# Patient Record
Sex: Male | Born: 1960 | Race: White | Hispanic: No | Marital: Single | Smoking: Current every day smoker
Health system: Southern US, Community
[De-identification: ages and names within clinical notes are randomized; demographics above are authoritative.]

## PROBLEM LIST (undated history)

## (undated) DIAGNOSIS — I509 Heart failure, unspecified: Secondary | ICD-10-CM

## (undated) DIAGNOSIS — K859 Acute pancreatitis without necrosis or infection, unspecified: Secondary | ICD-10-CM

## (undated) DIAGNOSIS — K7460 Unspecified cirrhosis of liver: Secondary | ICD-10-CM

## (undated) DIAGNOSIS — I10 Essential (primary) hypertension: Secondary | ICD-10-CM

## (undated) DIAGNOSIS — K219 Gastro-esophageal reflux disease without esophagitis: Secondary | ICD-10-CM

## (undated) DIAGNOSIS — M109 Gout, unspecified: Secondary | ICD-10-CM

## (undated) HISTORY — PX: CARDIAC CATHETERIZATION: SHX172

## (undated) HISTORY — PX: COLON SURGERY: SHX602

---

## 2015-05-08 ENCOUNTER — Ambulatory Visit: Payer: Self-pay

## 2015-05-15 ENCOUNTER — Ambulatory Visit: Payer: Self-pay

## 2017-02-22 ENCOUNTER — Emergency Department: Payer: Medicare Other

## 2017-02-22 ENCOUNTER — Encounter: Payer: Self-pay | Admitting: Emergency Medicine

## 2017-02-22 ENCOUNTER — Observation Stay
Admission: EM | Admit: 2017-02-22 | Discharge: 2017-02-24 | Disposition: A | Discharge status: Home / Self Care | Payer: Medicare Other | Attending: Internal Medicine | Admitting: Internal Medicine

## 2017-02-22 DIAGNOSIS — N179 Acute kidney failure, unspecified: Secondary | ICD-10-CM | POA: Diagnosis not present

## 2017-02-22 DIAGNOSIS — Z79899 Other long term (current) drug therapy: Secondary | ICD-10-CM | POA: Diagnosis not present

## 2017-02-22 DIAGNOSIS — E860 Dehydration: Secondary | ICD-10-CM | POA: Diagnosis not present

## 2017-02-22 DIAGNOSIS — M353 Polymyalgia rheumatica: Secondary | ICD-10-CM | POA: Insufficient documentation

## 2017-02-22 DIAGNOSIS — F17200 Nicotine dependence, unspecified, uncomplicated: Secondary | ICD-10-CM | POA: Diagnosis not present

## 2017-02-22 DIAGNOSIS — I5030 Unspecified diastolic (congestive) heart failure: Secondary | ICD-10-CM | POA: Diagnosis not present

## 2017-02-22 DIAGNOSIS — N182 Chronic kidney disease, stage 2 (mild): Secondary | ICD-10-CM | POA: Diagnosis not present

## 2017-02-22 DIAGNOSIS — D631 Anemia in chronic kidney disease: Secondary | ICD-10-CM | POA: Insufficient documentation

## 2017-02-22 DIAGNOSIS — R000 Tachycardia, unspecified: Secondary | ICD-10-CM | POA: Insufficient documentation

## 2017-02-22 DIAGNOSIS — K219 Gastro-esophageal reflux disease without esophagitis: Secondary | ICD-10-CM | POA: Insufficient documentation

## 2017-02-22 DIAGNOSIS — K7460 Unspecified cirrhosis of liver: Secondary | ICD-10-CM | POA: Diagnosis not present

## 2017-02-22 DIAGNOSIS — M10062 Idiopathic gout, left knee: Secondary | ICD-10-CM | POA: Diagnosis not present

## 2017-02-22 DIAGNOSIS — I130 Hypertensive heart and chronic kidney disease with heart failure and stage 1 through stage 4 chronic kidney disease, or unspecified chronic kidney disease: Secondary | ICD-10-CM | POA: Insufficient documentation

## 2017-02-22 DIAGNOSIS — M109 Gout, unspecified: Secondary | ICD-10-CM | POA: Diagnosis present

## 2017-02-22 DIAGNOSIS — Z8249 Family history of ischemic heart disease and other diseases of the circulatory system: Secondary | ICD-10-CM | POA: Insufficient documentation

## 2017-02-22 DIAGNOSIS — Z880 Allergy status to penicillin: Secondary | ICD-10-CM | POA: Diagnosis not present

## 2017-02-22 DIAGNOSIS — M1000 Idiopathic gout, unspecified site: Secondary | ICD-10-CM

## 2017-02-22 HISTORY — DX: Acute pancreatitis without necrosis or infection, unspecified: K85.90

## 2017-02-22 HISTORY — DX: Gout, unspecified: M10.9

## 2017-02-22 HISTORY — DX: Unspecified cirrhosis of liver: K74.60

## 2017-02-22 HISTORY — DX: Essential (primary) hypertension: I10

## 2017-02-22 HISTORY — DX: Heart failure, unspecified: I50.9

## 2017-02-22 HISTORY — DX: Gastro-esophageal reflux disease without esophagitis: K21.9

## 2017-02-22 LAB — CBC WITH DIFFERENTIAL/PLATELET
BASOS ABS: 0.1 10*3/uL (ref 0–0.1)
BASOS PCT: 1 %
EOS ABS: 0.4 10*3/uL (ref 0–0.7)
Eosinophils Relative: 3 %
HEMATOCRIT: 30.3 % — AB (ref 40.0–52.0)
Hemoglobin: 10.2 g/dL — ABNORMAL LOW (ref 13.0–18.0)
Lymphocytes Relative: 15 %
Lymphs Abs: 1.9 10*3/uL (ref 1.0–3.6)
MCH: 35.6 pg — ABNORMAL HIGH (ref 26.0–34.0)
MCHC: 33.5 g/dL (ref 32.0–36.0)
MCV: 106 fL — ABNORMAL HIGH (ref 80.0–100.0)
MONO ABS: 1 10*3/uL (ref 0.2–1.0)
Monocytes Relative: 8 %
NEUTROS ABS: 9.5 10*3/uL — AB (ref 1.4–6.5)
NEUTROS PCT: 73 %
Platelets: 881 10*3/uL — ABNORMAL HIGH (ref 150–440)
RBC: 2.86 MIL/uL — ABNORMAL LOW (ref 4.40–5.90)
RDW: 15.2 % — AB (ref 11.5–14.5)
WBC: 13 10*3/uL — ABNORMAL HIGH (ref 3.8–10.6)

## 2017-02-22 LAB — COMPREHENSIVE METABOLIC PANEL
ALK PHOS: 329 U/L — AB (ref 38–126)
ALT: 17 U/L (ref 17–63)
ANION GAP: 11 (ref 5–15)
AST: 30 U/L (ref 15–41)
Albumin: 3.3 g/dL — ABNORMAL LOW (ref 3.5–5.0)
BILIRUBIN TOTAL: 0.6 mg/dL (ref 0.3–1.2)
BUN: 45 mg/dL — ABNORMAL HIGH (ref 6–20)
CALCIUM: 10.8 mg/dL — AB (ref 8.9–10.3)
CO2: 20 mmol/L — ABNORMAL LOW (ref 22–32)
CREATININE: 2.08 mg/dL — AB (ref 0.61–1.24)
Chloride: 108 mmol/L (ref 101–111)
GFR calc non Af Amer: 34 mL/min — ABNORMAL LOW (ref >60)
GFR, EST AFRICAN AMERICAN: 40 mL/min — AB (ref >60)
GLUCOSE: 94 mg/dL (ref 65–99)
Potassium: 5.3 mmol/L — ABNORMAL HIGH (ref 3.5–5.1)
Sodium: 139 mmol/L (ref 135–145)
TOTAL PROTEIN: 8.8 g/dL — AB (ref 6.5–8.1)

## 2017-02-22 LAB — SYNOVIAL CELL COUNT + DIFF, W/ CRYSTALS
EOSINOPHILS-SYNOVIAL: 0 %
LYMPHOCYTES-SYNOVIAL FLD: 5 %
MONOCYTE-MACROPHAGE-SYNOVIAL FLUID: 4 %
Neutrophil, Synovial: 90 %
OTHER CELLS-SYN: 1
WBC, Synovial: 5890 /mm3 — ABNORMAL HIGH (ref 0–200)

## 2017-02-22 LAB — SEDIMENTATION RATE: Sed Rate: 127 mm/hr — ABNORMAL HIGH (ref 0–20)

## 2017-02-22 LAB — ETHANOL

## 2017-02-22 LAB — URIC ACID: Uric Acid, Serum: 11.9 mg/dL — ABNORMAL HIGH (ref 4.4–7.6)

## 2017-02-22 MED ORDER — METHYLPREDNISOLONE SODIUM SUCC 125 MG IJ SOLR
125.0000 mg | Freq: Once | INTRAMUSCULAR | Status: AC
  Administered 2017-02-22: 125 mg via INTRAVENOUS
  Filled 2017-02-22: qty 2

## 2017-02-22 MED ORDER — ALLOPURINOL 100 MG PO TABS
50.0000 mg | ORAL_TABLET | Freq: Every day | ORAL | Status: DC
  Administered 2017-02-22 – 2017-02-23 (×2): 50 mg via ORAL
  Filled 2017-02-22 (×2): qty 0.5

## 2017-02-22 MED ORDER — SODIUM CHLORIDE 0.9 % IV SOLN
Freq: Once | INTRAVENOUS | Status: AC
  Administered 2017-02-22: 11:00:00 via INTRAVENOUS

## 2017-02-22 MED ORDER — COLCHICINE 0.6 MG PO TABS
0.6000 mg | ORAL_TABLET | Freq: Every day | ORAL | Status: DC
  Administered 2017-02-23 – 2017-02-24 (×2): 0.6 mg via ORAL
  Filled 2017-02-22 (×2): qty 1

## 2017-02-22 MED ORDER — ACETAMINOPHEN 325 MG RE SUPP
650.0000 mg | Freq: Four times a day (QID) | RECTAL | Status: DC | PRN
  Filled 2017-02-22: qty 2

## 2017-02-22 MED ORDER — SODIUM CHLORIDE 0.9 % IV SOLN
INTRAVENOUS | Status: DC
  Administered 2017-02-22 – 2017-02-24 (×4): via INTRAVENOUS

## 2017-02-22 MED ORDER — ENOXAPARIN SODIUM 40 MG/0.4ML ~~LOC~~ SOLN
40.0000 mg | SUBCUTANEOUS | Status: DC
  Filled 2017-02-22 (×2): qty 0.4

## 2017-02-22 MED ORDER — HYDROCODONE-ACETAMINOPHEN 5-325 MG PO TABS
1.0000 | ORAL_TABLET | Freq: Once | ORAL | Status: AC
  Administered 2017-02-22: 1 via ORAL
  Filled 2017-02-22: qty 1

## 2017-02-22 MED ORDER — PREDNISONE 20 MG PO TABS
60.0000 mg | ORAL_TABLET | Freq: Every day | ORAL | Status: DC
  Administered 2017-02-23 – 2017-02-24 (×2): 60 mg via ORAL
  Filled 2017-02-22 (×2): qty 3

## 2017-02-22 MED ORDER — COLCHICINE 0.6 MG PO TABS
1.2000 mg | ORAL_TABLET | Freq: Once | ORAL | Status: AC
  Administered 2017-02-22: 1.2 mg via ORAL
  Filled 2017-02-22: qty 2

## 2017-02-22 MED ORDER — OXYCODONE HCL 5 MG PO TABS
5.0000 mg | ORAL_TABLET | ORAL | Status: DC | PRN
  Administered 2017-02-22 – 2017-02-24 (×7): 5 mg via ORAL
  Filled 2017-02-22 (×7): qty 1

## 2017-02-22 MED ORDER — NICOTINE 21 MG/24HR TD PT24: 21.0000 mg | MEDICATED_PATCH | Freq: Every day | TRANSDERMAL | Status: DC | PRN

## 2017-02-22 MED ORDER — HYDROMORPHONE HCL 1 MG/ML IJ SOLN
1.0000 mg | Freq: Once | INTRAMUSCULAR | Status: AC
  Administered 2017-02-22: 1 mg via INTRAVENOUS
  Filled 2017-02-22: qty 1

## 2017-02-22 MED ORDER — ACETAMINOPHEN 325 MG PO TABS: 650.0000 mg | ORAL_TABLET | Freq: Four times a day (QID) | ORAL | Status: DC | PRN

## 2017-02-22 NOTE — ED Notes (Signed)
Admitting MD at bedside.

## 2017-02-22 NOTE — ED Triage Notes (Signed)
Brought in via ems from home  Presents with pain to left hip which is radiating into lower leg and having pain to right knee into lower leg

## 2017-02-22 NOTE — Care Management Note (Signed)
Case Management Note  Patient Details  Name: Larry Andrade. MRN: [id number] Date of Birth: 1960-11-02  Subjective/Objective:    Admitted to [facility] under observation status with the diagnosis of gout. Lives with girlfriend, Arther Abbott [phone] Last seen Dr. Cedric Fishman in June 2018. Prescriptions are filled at [facility]. No home health. No skilled facility. No home oxygen. No medical equipment in the home. Takes care of all basic activities of daily living himself, drives. Fell 3 Saturdays ago. Good appetite. Girlfriend will transport                Action/Plan: No discharge needs identified at this time  Expected Discharge Date:                  Expected Discharge Plan:     In-House Referral:     Discharge planning Services     Post Acute Care Choice:    Choice offered to:     DME Arranged:    DME Agency:     HH Arranged:    HH Agency:     Status of Service:     If discussed at [employer], dates discussed:    Additional Comments:  Gwenette Greet, RNMSN CCM Care Management [phone] 02/22/2017, 3:09 PM

## 2017-02-22 NOTE — Progress Notes (Signed)
Patient is a high fall risk and refused the bed alarm. Patient educated and verbalized understanding.

## 2017-02-22 NOTE — Care Management Obs Status (Signed)
MEDICARE OBSERVATION STATUS NOTIFICATION   Patient Details  Name: Larry Andrade. MRN: [id number] Date of Birth: 05/11/1961   Medicare Observation Status Notification Given:  Yes    Gwenette Greet, RN 02/22/2017, 3:09 PM

## 2017-02-22 NOTE — H&P (Signed)
[facility] [place] [facility]   PATIENT NAME: Larry Andrade    MR#:  [id number]  DATE OF BIRTH:  1960-08-02  DATE OF ADMISSION:  02/22/2017  PRIMARY CARE PHYSICIAN: Pricilla Holm, MD   REQUESTING/REFERRING PHYSICIAN: Dr Presley Raddle  CHIEF COMPLAINT:   Chief Complaint  Patient presents with  . Leg Pain    HISTORY OF PRESENT ILLNESS:  Larry Andrade  is a 56 y.o. male with a known history of gout presents with 2 months worth of leg pain. His left knee hurts most at this time. 10 out of 10 intensity in his left knee. He's been taking colchicine 1-2 tablets a day. He states that his right leg hurts from his knee down. His left side hurts from his hip down. He's been having to walk with crutches and his knees give out. In the ER, he was found to be in acute kidney injury with a high uric acid. Hospitalist services asked for admission.  PAST MEDICAL HISTORY:   Past Medical History:  Diagnosis Date  . CHF (congestive heart failure) (HCC)   . Cirrhosis (HCC)   . GERD (gastroesophageal reflux disease)   . Gout   . Hypertension   . Pancreatitis     PAST SURGICAL HISTORY:   Past Surgical History:  Procedure Laterality Date  . CARDIAC CATHETERIZATION    . COLON SURGERY      SOCIAL HISTORY:   Social History  Substance Use Topics  . Smoking status: Current Every Day Smoker  . Smokeless tobacco: Never Used  . Alcohol use No     Comment: former    FAMILY HISTORY:   Family History  Problem Relation Age of Onset  . CVA Mother   . CAD Mother   . CAD Father     DRUG ALLERGIES:   Allergies  Allergen Reactions  . Penicillins Rash    REVIEW OF SYSTEMS:  CONSTITUTIONAL: No fever, positive for weakness. Positive for chills. EYES: No blurred or double vision.  EARS, NOSE, AND THROAT: No tinnitus or ear pain. No sore throat RESPIRATORY: positive for cough and shortness of breath. nowheezing or hemoptysis.  CARDIOVASCULAR: No chest  pain, orthopnea, edema.  GASTROINTESTINAL: No nausea, vomiting,  or abdominal pain. No blood in bowel movements. Positive diarrhea with colchicine GENITOURINARY: No dysuria, hematuria.  ENDOCRINE: No polyuria, nocturia,  HEMATOLOGY: No anemia, easy bruising or bleeding SKIN: No rash or lesion. MUSCULOSKELETAL:positive for joint pains  NEUROLOGIC: No tingling, numbness, weakness.  PSYCHIATRY: history of anxiety and depression  MEDICATIONS AT HOME:   Prior to Admission medications   Not on File   Medication reconciliation not done yet    VITAL SIGNS:  Blood pressure 114/78, pulse (!) 117, temperature (!) 97.5 F (36.4 C), temperature source Oral, resp. rate 20, height 6\' 2"  (1.88 m), weight 90.7 kg (200 lb), SpO2 100 %.  PHYSICAL EXAMINATION:  GENERAL:  56 y.o.-year-old patient lying in the bed with no acute distress.  EYES: Pupils equal, round, reactive to light and accommodation. No scleral icterus. Extraocular muscles intact.  HEENT: Head atraumatic, normocephalic. Oropharynx and nasopharynx clear.  NECK:  Supple, no jugular venous distention. No thyroid enlargement, no tenderness.  LUNGS: Normal breath sounds bilaterally, no wheezing, rales,rhonchi or crepitation. No use of accessory muscles of respiration.  CARDIOVASCULAR: S1, S2 normal. No murmurs, rubs, or gallops.  ABDOMEN: Soft, nontender, nondistended. Bowel sounds present. No organomegaly or mass.  EXTREMITIES: No pedal edema, cyanosis, or clubbing. Left knee swelling  and poor range of motion. Painful range of motion left hip. Joint deformities bilateral hands. Right elbow nodule. Joint deformities on toes. NEUROLOGIC: Cranial nerves II through XII are intact. Muscle strength 5/5 in all extremities. Sensation intact. Gait not checked.  PSYCHIATRIC: The patient is alert and oriented x 3.  SKIN: No rash, lesion, or ulcer.   LABORATORY PANEL:   CBC  Recent Labs Lab 02/22/17 0920  WBC 13.0*  HGB 10.2*  HCT 30.3*  PLT  881*   ------------------------------------------------------------------------------------------------------------------  Chemistries   Recent Labs Lab 02/22/17 0920  NA 139  K 5.3*  CL 108  CO2 20*  GLUCOSE 94  BUN 45*  CREATININE 2.08*  CALCIUM 10.8*  AST 30  ALT 17  ALKPHOS 329*  BILITOT 0.6   ------------------------------------------------------------------------------------------------------------------   RADIOLOGY:  Dg Ankle Complete Left  Result Date: 02/22/2017 CLINICAL DATA:  Left hip and foot pain for 2 weeks, cannot bear weight EXAM: LEFT ANKLE COMPLETE - 3+ VIEW COMPARISON:  None. FINDINGS: No acute fracture is seen. The ankle joint appears normal and alignment is normal. However there do appear to be erosions involving the tip of the distal fibula as well as posterior malleolus with ankle joint space effusion present. Is there history of arthritis such as gout? Some degenerative changes noted in the midfoot. IMPRESSION: 1. Possible erosions involving the distal left fibula and posterior malleolus with ankle joint effusion. Question arthritis such as gout. 2. No fracture Electronically Signed   By: Dwyane Dee M.D.   On: 02/22/2017 09:24   Dg Knee Complete 4 Views Left  Result Date: 02/22/2017 CLINICAL DATA:  Left hip and foot pain for 2 weeks, cannot bear weight EXAM: LEFT KNEE - COMPLETE 4+ VIEW COMPARISON:  None. FINDINGS: There is mild tricompartmental degenerative joint disease of the left knee with some loss of joint space. No fracture is seen. However there does appear to be a moderate size left knee joint effusion present. Arthritis would be a definite consideration. IMPRESSION: 1. No fracture. 2. Left knee joint effusion with moderate tricompartmental degenerative joint disease. Cannot exclude arthritis. Electronically Signed   By: Dwyane Dee M.D.   On: 02/22/2017 09:26   Dg Foot Complete Left  Result Date: 02/22/2017 CLINICAL DATA:  Left foot pain EXAM:  LEFT FOOT - COMPLETE 3+ VIEW COMPARISON:  None. FINDINGS: No acute fracture or dislocation. Mild osteoarthritis of the first MTP joint. Mild osteoarthritis of the first TMT joint. Mild osteoarthritis of the talonavicular joint. Mild osteoarthritis of the subtalar joint. Large ankle joint effusion. Large posterior subtalar joint effusion. IMPRESSION: 1.  No acute osseous injury of the left foot. 2. Osteoarthritis of the left foot as described above. 3. Large joint effusions of the ankle and posterior subtalar joint. Electronically Signed   By: Elige Ko   On: 02/22/2017 09:24     IMPRESSION AND PLAN:   1. Acute gout in multiple joints, worse within the left knee. The patient may have an underlying rheumatoid arthritis. In the ER the patient was given culture seen and Solu-Medrol. I will continue prednisone and colchicine on a daily basis. Case discussed with rheumatologist Dr. Lavenia Atlas to evaluate the patient. Add on and ANA and rheumatoid factor. Pain control with oxycodone. 2. Thrombocytosis could be an acute phase reactant.  Recheck tomorrow after hydration. May end up needing a hematology consultation. 3. Tobacco abuse. Smoking cessation counseling done 4 minutes by me. Nicotine patch when necessary 4. Essential hypertension blood pressure stable at this  point 5. Tachycardia likely related to pain 6. Hypercalcemia could be dehydration. Check a PTH 7. Acute kidney injury IV fluid hydration and recheck creatinine tomorrow 8. History of cirrhosis and alcohol past 9. No signs of congestive heart failure   All the records are reviewed and case discussed with ED provider. Management plans discussed with the patient, family and they are in agreement.  CODE STATUS: full code  TOTAL TIME TAKING CARE OF THIS PATIENT: 50 minutes.    Alford Highland M.D on 02/22/2017 at 11:25 AM  Between 7am to 6pm - Pager - [phone]  After 6pm call admission pager [phone]  [facility]  [phone]  CC: Primary care physician; Pricilla Holm, MD

## 2017-02-22 NOTE — Evaluation (Signed)
Physical Therapy Evaluation Patient Details Name: Larry Andrade. MRN: [id number] DOB: 1960/08/22 Today's Date: 02/22/2017   History of Present Illness  Pt is a 56 y.o. male presenting to hospital with severe L knee, ankle, and foot pain.  Pt admitted with acute gout in multiple joints (worse in L knee) and thrombocytosis.  PMH includes CHF, gout, htn, gout attack, cardiac cath.  Clinical Impression  Prior to hospital admission, pt was ambulating with axillary crutches d/t L LE pain but reports L knee/LE giving out d/t pain.  Pt lives with his girlfriend in 1 level home with 2 steps to enter (no railing).  Currently pt is min assist supine to sit; CGA with transfers; and CGA ambulating 25 feet with RW (limited distance per PT d/t 8/10 L LE pain).  Impaired L knee flexion and extension ROM noted as well as impaired L LE strength complicated d/t L LE hip, knee, and ankle pain.   Minimal WB'ing noted through L LE with mobility.  Pt appearing steady with use of RW and pt reporting feeling more comfortable and safe with RW compared to axillary crutches he had been using. Pt would benefit from skilled PT to address noted impairments and functional limitations (see below for any additional details).  Upon hospital discharge, recommend pt discharge to home with support of friends/family and OP PT.    Follow Up Recommendations Outpatient PT;Other (comment) (assist for stairs)    Equipment Recommendations  Rolling walker with 5" wheels    Recommendations for Other Services       Precautions / Restrictions Precautions Precautions: Fall Restrictions Weight Bearing Restrictions: No      Mobility  Bed Mobility Overal bed mobility: Needs Assistance Bed Mobility: Supine to Sit     Supine to sit: Min assist;HOB elevated     General bed mobility comments: assist for L LE d/t pain supine to sit  Transfers Overall transfer level: Needs assistance Equipment used: Rolling walker (2  wheeled) Transfers: Sit to/from Stand Sit to Stand: Min guard         General transfer comment: steady strong stand with RW; minimal WB'ing noted through L LE d/t pain  Ambulation/Gait Ambulation/Gait assistance: Min guard Ambulation Distance (Feet): 25 Feet Assistive device: Rolling walker (2 wheeled)   Gait velocity: decreased   General Gait Details: L knee and hip flexed throughout gait; decreased stance time and minimal WB'ing noted through L LE; initial vc's for walker use  Stairs            Wheelchair Mobility    Modified Rankin (Stroke Patients Only)       Balance Overall balance assessment: History of Falls;Needs assistance Sitting-balance support: No upper extremity supported;Feet supported Sitting balance-Leahy Scale: Good Sitting balance - Comments: sitting reaching within BOS   Standing balance support: Bilateral upper extremity supported (on RW) Standing balance-Leahy Scale: Fair Standing balance comment: static standing; pt steady with use of RW                             Pertinent Vitals/Pain Pain Assessment: 0-10 Pain Score: 8  Pain Location: L knee and L hip Pain Descriptors / Indicators: Burning;Tender;Grimacing;Guarding Pain Intervention(s): Limited activity within patient's tolerance;Monitored during session;Premedicated before session;Repositioned  O2 WFL during session on room air.  HR 104-114 bpm during session.    Home Living Family/patient expects to be discharged to:: Private residence Living Arrangements: Spouse/significant other Available Help at Discharge: Friend(s) Type  of Home: House Home Access: Stairs to enter Entrance Stairs-Rails: None Entrance Stairs-Number of Steps: 2 Home Layout: One level;Other (Comment) (1 step up to kitchen and 1 step down to living room in home) Home Equipment: Crutches (may have access to his mother's 4ww)      Prior Function Level of Independence: Independent with assistive  device(s)         Comments: Pt has been ambulating with axillary crutches but has had 2 falls (1 getting into shower and 1 fell back onto bed d/t L LE pain); pt also reporting L knee has been giving out when walking with crutches     Hand Dominance        Extremity/Trunk Assessment   Upper Extremity Assessment Upper Extremity Assessment: Overall WFL for tasks assessed    Lower Extremity Assessment Lower Extremity Assessment: LLE deficits/detail (R LE WFL) LLE Deficits / Details: L hip flexion at least 3-/5 (limited d/t hip pain); L knee flexion/extension at least 2/5 (limited d/t pain); L DF at least 3/5 AROM (deferred MMT d/t L ankle pain); L ankle ROM WFL; L knee flexion to 90 degrees (limited d/t pain); L knee extension grossly 20 degrees short of neutral (limited d/t pain) LLE: Unable to fully assess due to pain    Cervical / Trunk Assessment Cervical / Trunk Assessment: Normal  Communication   Communication: No difficulties  Cognition Arousal/Alertness: Awake/alert Behavior During Therapy: WFL for tasks assessed/performed Overall Cognitive Status: Within Functional Limits for tasks assessed                                        General Comments General comments (skin integrity, edema, etc.): Pt resting in bed upon PT entry.  Nursing cleared pt for participation in physical therapy and gave pt pain meds prior to PT session.  Pt agreeable to PT session.    Exercises     Assessment/Plan    PT Assessment Patient needs continued PT services  PT Problem List Decreased strength;Decreased range of motion;Decreased activity tolerance;Decreased balance;Decreased mobility;Decreased knowledge of use of DME;Pain       PT Treatment Interventions DME instruction;Gait training;Stair training;Functional mobility training;Therapeutic activities;Therapeutic exercise;Balance training;Patient/family education    PT Goals (Current goals can be found in the Care Plan  section)  Acute Rehab PT Goals Patient Stated Goal: to have less L LE pain PT Goal Formulation: With patient Time For Goal Achievement: 03/08/17 Potential to Achieve Goals: Fair    Frequency Min 2X/week   Barriers to discharge        Co-evaluation               AM-PAC PT "6 Clicks" Daily Activity  Outcome Measure Difficulty turning over in bed (including adjusting bedclothes, sheets and blankets)?: A Little Difficulty moving from lying on back to sitting on the side of the bed? : Unable Difficulty sitting down on and standing up from a chair with arms (e.g., wheelchair, bedside commode, etc,.)?: A Little Help needed moving to and from a bed to chair (including a wheelchair)?: A Little Help needed walking in hospital room?: A Little Help needed climbing 3-5 steps with a railing? : A Lot 6 Click Score: 15    End of Session Equipment Utilized During Treatment: Gait belt Activity Tolerance: No increased pain Patient left: in chair;with call bell/phone within reach;with nursing/sitter in room (Nursing reports no need for chair alarm (no  bed alarm on upon PT entry)) Nurse Communication: Mobility status;Precautions;Other (comment) (Pt's pain status) PT Visit Diagnosis: Other abnormalities of gait and mobility (R26.89);Muscle weakness (generalized) (M62.81);History of falling (Z91.81);Pain Pain - Right/Left: Left Pain - part of body: Hip;Knee;Ankle and joints of foot    Time: 3291-9166 PT Time Calculation (min) (ACUTE ONLY): 20 min   Charges:   PT Evaluation $PT Eval Low Complexity: 1 Low     PT G Codes:   PT G-Codes **NOT FOR INPATIENT CLASS** Functional Assessment Tool Used: AM-PAC 6 Clicks Basic Mobility Functional Limitation: Mobility: Walking and moving around Mobility: Walking and Moving Around Current Status (M6004): At least 40 percent but less than 60 percent impaired, limited or restricted Mobility: Walking and Moving Around Goal Status [phone] At least 1  percent but less than 20 percent impaired, limited or restricted    Hendricks Limes, PT 02/22/17, 4:30 PM [phone]

## 2017-02-22 NOTE — ED Notes (Signed)
Pt able to stand and pivot onto toilet. Pt pushed to bathroom via wheelchair.

## 2017-02-22 NOTE — Consult Note (Signed)
Reason for Consult: Joint pain and swelling  Referring Physician: Hospitalist  Hy Charlcie Cradle.   HPI: 56 year old white male. 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 By his report for the last 12 years he's had pain and swelling in his joints. He's developed nodule over his elbows. He said his left knee draining. Was told he had gout. He has even drained lesions in his fingers by using small syringe. Usually gets a cheesy material. He's never had kidney stones. He has had a solid left knee aspirated and injected. Gouty crystals were shown. He intermittently takes colchicine when he has a flare. He's not been on allopurinol or urine or car probenecid. There is no significant family history History of previously status is significant alcohol. Chart carries a diagnosis of cirrhosis but he is unaware. Recent flare and left ankle and left knee. 4 difficulty ambulating. Admitted. Had no fever. Did have thrombocytosis and anemia. And leukocytosis. Renal insufficiency No history of diabetes Alkaline phosphatase elevated at 329. Uric acid and 1.9. Sedimentation rate at 127.  PMH: Congestive heart failure. Cirrhosis. Gouty arthritis. Hypertension. Reflux. Pancreatitis.  SURGICAL HISTORY: Colostomy for benign colon mass  Family History: Negative for gout  Social History: Used to drink heavily but stopped for several years  Allergies:  Allergies  Allergen Reactions  . Penicillins Rash    Medications:  Scheduled: . allopurinol  50 mg Oral Daily  . [START ON 02/23/2017] colchicine  0.6 mg Oral Daily  . [START ON 02/23/2017] enoxaparin (LOVENOX) injection  40 mg Subcutaneous Q24H  . [START ON 02/23/2017] predniSONE  60 mg Oral Q breakfast        ROS:No weight change. No chest pain. No abdominal pain. No diarrhea.   PHYSICAL EXAM: Blood pressure 117/83, pulse (!) 107, temperature 97.7 F (36.5 C), temperature source Oral, resp. rate 18, height 6\' 2"  (1.88  m), weight 78.8 kg (173 lb 11.2 oz), SpO2 100 %. Pleasant male. Obvious joint pain with movement around the chair. Sclera clear. Clear pharynx. Clear chest. No definite visceromegaly. 1+ edema. Musculoskeletal: Mild disease range of motion of both shoulders. Bilateral elbow nodules with tophi. The right has been recently draining. Numerous tophaceous changes over both hands particularly of the right second third MCPs. Hips move reasonably. Left knee has a large effusion. Contracture. Right knee with effusion. Left ankle synovitis. Decreased range of motion right ankle. Contracted toes on the right. Tophaceous change left third PIP  Assessment: Tophaceous gout with recent flare Thrombocytosis. May be from his chronic gouty arthritis Renal insufficiency. Unknown duration or etiology Elevated alkaline phosphatase. Presumed liver. Cannot rule out underlying liver disease  Recommendations:  Procedure: Left knee prepped in sterile manner. Aspirated 110 cc of cloudy fluid. Injected with 2 cc  Xylocaine 2 cc Marcaine and 1 cc of generic Kenalog  X-ray reports reviewed Begin allopurinol at renal insufficiency dosing. 50 mg 1 by mouth daily. If creatinine is stable and improves he may be able to go on colchicine perhaps one pill every other day Would need long-term follow-up to ensure compliance and treating uric acid to goal which would be less than 5 with tophaceous changes May use IV steroids or oral steroid to calm his current flare  Kandyce Rud 02/22/2017, 4:58 PM

## 2017-02-22 NOTE — ED Notes (Signed)
Report to Mary, RN

## 2017-02-22 NOTE — ED Notes (Signed)
Pt given snack. 

## 2017-02-22 NOTE — ED Provider Notes (Signed)
[facility] Emergency Department Provider Note   ____________________________________________   I have reviewed the triage vital signs and the nursing notes.   HISTORY  Chief Complaint Leg Pain    HPI Larry Andrade. is a 56 y.o. male who presents to the emergency department for severe left knee, ankle and foot pain. Patient reports pain is interfering with walking to the point he feels unsteady as if he were going to fall. Patient denies any recent falls or injury to the left lower extremity. Patient reports increased pain with active movement of the left knee or ankle in addition to significant swelling along both joints. Patient has had to use an assisted device with all mobility activities. Patient denies fever, chills, headache, vision changes, chest pain, chest tightness, shortness of breath, abdominal pain, nausea and vomiting.  Past Medical History:  Diagnosis Date  . CHF (congestive heart failure) (HCC)   . Cirrhosis (HCC)   . GERD (gastroesophageal reflux disease)   . Gout   . Hypertension   . Pancreatitis     Patient Active Problem List   Diagnosis Date Noted  . Gout attack 02/22/2017    Past Surgical History:  Procedure Laterality Date  . CARDIAC CATHETERIZATION    . COLON SURGERY      Prior to Admission medications   Not on File    Allergies Penicillins  Family History  Problem Relation Age of Onset  . CVA Mother   . CAD Mother   . CAD Father     Social History Social History  Substance Use Topics  . Smoking status: Current Every Day Smoker  . Smokeless tobacco: Never Used  . Alcohol use No     Comment: former   Review of Systems Constitutional: Negative for fever/chills Eyes: No visual changes. ENT:  Negative for sore throat and for difficulty swallowing Cardiovascular: Denies chest pain. Respiratory: Denies cough. Denies shortness of breath. Gastrointestinal: No abdominal pain.  No nausea, vomiting,  diarrhea. Genitourinary: Negative for dysuria. Musculoskeletal: Positive for left knee, ankle and foot pain. Skin: Negative for rash. Neurological: Negative for headaches.  ____________________________________________   PHYSICAL EXAM:  VITAL SIGNS: ED Triage Vitals  Enc Vitals Group     BP 02/22/17 0816 114/78     Pulse Rate 02/22/17 0816 (!) 117     Resp 02/22/17 0816 20     Temp 02/22/17 0816 (!) 97.5 F (36.4 C)     Temp Source 02/22/17 0816 Oral     SpO2 02/22/17 0816 100 %     Weight 02/22/17 0817 200 lb (90.7 kg)     Height 02/22/17 0817 6\' 2"  (1.88 m)     Head Circumference --      Peak Flow --      Pain Score 02/22/17 0816 10     Pain Loc --      Pain Edu? --      Excl. in GC? --     Constitutional: Alert and oriented. Well appearing and in no acute distress.  Eyes: Conjunctivae are normal. PERRL. EOMI  Head: Normocephalic and atraumatic. ENT:      Ears: Canals clear. TMs intact bilaterally.      Nose: No congestion/rhinnorhea.      Mouth/Throat: Mucous membranes are moist.  Neck:Supple. No thyromegaly. No stridor.  Cardiovascular: Normal rate, regular rhythm. Normal S1 and S2.  Good peripheral circulation. Respiratory: Normal respiratory effort without tachypnea or retractions. Lungs CTAB. No wheezes/rales/rhonchi. Good air entry to the  bases with no decreased or absent breath sounds. Hematological/Lymphatic/Immunological: No cervical lymphadenopathy. Cardiovascular: Normal rate, regular rhythm. Normal distal pulses. Gastrointestinal: Bowel sounds 4 quadrants. Soft and nontender to palpation. No guarding or rigidity. No palpable masses. No distention. No CVA tenderness. Musculoskeletal: Severe left knee, ankle and foot pain and swelling. Erythema along the dorsal aspect of the foot, ankle. Erythema noted along the anterior knee. Good capillary refill, knee, ankle and foot is warm and dry Neurologic: Normal speech and language.  Skin:  Skin is warm, dry and  intact. No rash noted. Psychiatric: Mood and affect are normal. Speech and behavior are normal. Patient exhibits appropriate insight and judgement.  ____________________________________________   LABS (all labs ordered are listed, but only abnormal results are displayed)  Labs Reviewed  URIC ACID - Abnormal; Notable for the following:       Result Value   Uric Acid, Serum 11.9 (*)    All other components within normal limits  SEDIMENTATION RATE - Abnormal; Notable for the following:    Sed Rate 127 (*)    All other components within normal limits  COMPREHENSIVE METABOLIC PANEL - Abnormal; Notable for the following:    Potassium 5.3 (*)    CO2 20 (*)    BUN 45 (*)    Creatinine, Ser 2.08 (*)    Calcium 10.8 (*)    Total Protein 8.8 (*)    Albumin 3.3 (*)    Alkaline Phosphatase 329 (*)    GFR calc non Af Amer 34 (*)    GFR calc Af Amer 40 (*)    All other components within normal limits  CBC WITH DIFFERENTIAL/PLATELET - Abnormal; Notable for the following:    WBC 13.0 (*)    RBC 2.86 (*)    Hemoglobin 10.2 (*)    HCT 30.3 (*)    MCV 106.0 (*)    MCH 35.6 (*)    RDW 15.2 (*)    Platelets 881 (*)    Neutro Abs 9.5 (*)    All other components within normal limits  ETHANOL  RHEUMATOID FACTOR  ANA W/REFLEX IF POSITIVE  HIV ANTIBODY (ROUTINE TESTING)   ____________________________________________  EKG none ____________________________________________  RADIOLOGY DG left knee FINDINGS: There is mild tricompartmental degenerative joint disease of the left knee with some loss of joint space. No fracture is seen. However there does appear to be a moderate size left knee joint effusion present. Arthritis would be a definite consideration.  IMPRESSION: 1. No fracture. 2. Left knee joint effusion with moderate tricompartmental degenerative joint disease. Cannot exclude arthritis.  DG left ankle FINDINGS: No acute fracture is seen. The ankle joint appears normal  and alignment is normal. However there do appear to be erosions involving the tip of the distal fibula as well as posterior malleolus with ankle joint space effusion present. Is there history of arthritis such as gout? Some degenerative changes noted in the midfoot.  IMPRESSION: 1. Possible erosions involving the distal left fibula and posterior malleolus with ankle joint effusion. Question arthritis such as gout. 2. No fracture  DG left foot FINDINGS: No acute fracture or dislocation. Mild osteoarthritis of the first MTP joint. Mild osteoarthritis of the first TMT joint. Mild osteoarthritis of the talonavicular joint. Mild osteoarthritis of the subtalar joint. Large ankle joint effusion. Large posterior subtalar joint effusion.  IMPRESSION: 1. No acute osseous injury of the left foot. 2. Osteoarthritis of the left foot as described above. 3. Large joint effusions of the ankle and posterior  subtalar joint. ____________________________________________   PROCEDURES  Procedure(s) performed: no    Critical Care performed: no ____________________________________________   INITIAL IMPRESSION / ASSESSMENT AND PLAN / ED COURSE  Pertinent labs & imaging results that were available during my care of the patient were reviewed by me and considered in my medical decision making (see chart for details).  Patient presents to emergency department with left knee, ankle and foot pain and swelling likely associated with gout exacerbation. Treatment initiated with Solu-Medrol, colchicine and Vicodin. I spoke with Dr. Daryel November reviewing patient's lab results and his lack of pain management at this time. His recommendation was to contact the hospitalists regarding consultation for admission. Communicated with hospitalist, [employer]. Dr. Valla Leaver came to Flex Room 53 to consult with patient and care transferred at that time. ----------------------------------------- 11:09 AM on  02/22/2017 -----------------------------------------  ____________________________________________   FINAL CLINICAL IMPRESSION(S) / ED DIAGNOSES  Final diagnoses:  Gout flare  Acute idiopathic gout, unspecified site       NEW MEDICATIONS STARTED DURING THIS VISIT:  New Prescriptions   No medications on file     Note:  This document was prepared using Dragon voice recognition software and may include unintentional dictation errors.    Percell Boston 02/22/17 1127    Emily Filbert, MD 02/22/17 1224

## 2017-02-23 LAB — CBC
HEMATOCRIT: 25.8 % — AB (ref 40.0–52.0)
HEMOGLOBIN: 8.8 g/dL — AB (ref 13.0–18.0)
MCH: 35.9 pg — AB (ref 26.0–34.0)
MCHC: 34 g/dL (ref 32.0–36.0)
MCV: 105.8 fL — AB (ref 80.0–100.0)
PLATELETS: 710 10*3/uL — AB (ref 150–440)
RBC: 2.44 MIL/uL — ABNORMAL LOW (ref 4.40–5.90)
RDW: 14.8 % — AB (ref 11.5–14.5)
WBC: 11.6 10*3/uL — AB (ref 3.8–10.6)

## 2017-02-23 LAB — IRON AND TIBC
Iron: 53 ug/dL (ref 45–182)
Saturation Ratios: 25 % (ref 17.9–39.5)
TIBC: 210 ug/dL — AB (ref 250–450)
UIBC: 157 ug/dL

## 2017-02-23 LAB — BASIC METABOLIC PANEL
ANION GAP: 8 (ref 5–15)
BUN: 43 mg/dL — AB (ref 6–20)
CHLORIDE: 107 mmol/L (ref 101–111)
CO2: 20 mmol/L — ABNORMAL LOW (ref 22–32)
Calcium: 9.4 mg/dL (ref 8.9–10.3)
Creatinine, Ser: 1.45 mg/dL — ABNORMAL HIGH (ref 0.61–1.24)
GFR calc Af Amer: >60 mL/min (ref >60)
GFR, EST NON AFRICAN AMERICAN: 53 mL/min — AB (ref >60)
GLUCOSE: 158 mg/dL — AB (ref 65–99)
POTASSIUM: 5.1 mmol/L (ref 3.5–5.1)
Sodium: 135 mmol/L (ref 135–145)

## 2017-02-23 LAB — HIV ANTIBODY (ROUTINE TESTING W REFLEX): HIV SCREEN 4TH GENERATION: NONREACTIVE

## 2017-02-23 LAB — VITAMIN B12: Vitamin B-12: 235 pg/mL (ref 180–914)

## 2017-02-23 LAB — RETICULOCYTES
RBC.: 2.3 MIL/uL — AB (ref 4.40–5.90)
RETIC CT PCT: 1.4 % (ref 0.4–3.1)
Retic Count, Absolute: 32.2 10*3/uL (ref 19.0–183.0)

## 2017-02-23 LAB — FERRITIN: FERRITIN: 879 ng/mL — AB (ref 24–336)

## 2017-02-23 LAB — FOLATE: Folate: 4.3 ng/mL — ABNORMAL LOW (ref >5.9)

## 2017-02-23 LAB — PSA: PROSTATIC SPECIFIC ANTIGEN: 0.27 ng/mL (ref 0.00–4.00)

## 2017-02-23 LAB — ANA W/REFLEX IF POSITIVE: Anti Nuclear Antibody(ANA): NEGATIVE

## 2017-02-23 LAB — RHEUMATOID FACTOR: RHEUMATOID FACTOR: 12.7 [IU]/mL (ref 0.0–13.9)

## 2017-02-23 MED ORDER — AMLODIPINE BESYLATE 5 MG PO TABS
5.0000 mg | ORAL_TABLET | Freq: Every day | ORAL | Status: DC
  Administered 2017-02-23: 13:00:00 5 mg via ORAL
  Filled 2017-02-23: qty 1

## 2017-02-23 MED ORDER — ALUM & MAG HYDROXIDE-SIMETH 200-200-20 MG/5ML PO SUSP
30.0000 mL | ORAL | Status: DC | PRN
  Administered 2017-02-23 – 2017-02-24 (×4): 30 mL via ORAL
  Filled 2017-02-23 (×4): qty 30

## 2017-02-23 MED ORDER — PANTOPRAZOLE SODIUM 40 MG PO TBEC
40.0000 mg | DELAYED_RELEASE_TABLET | Freq: Every day | ORAL | Status: DC
  Administered 2017-02-23 – 2017-02-24 (×2): 40 mg via ORAL
  Filled 2017-02-23 (×2): qty 1

## 2017-02-23 MED ORDER — ALLOPURINOL 100 MG PO TABS
100.0000 mg | ORAL_TABLET | Freq: Every day | ORAL | Status: DC
  Administered 2017-02-23: 13:00:00 50 mg via ORAL
  Administered 2017-02-24: 08:00:00 100 mg via ORAL
  Filled 2017-02-23 (×2): qty 1

## 2017-02-23 NOTE — Progress Notes (Signed)
[employer] [facility] - [place] [facility]   PATIENT NAME: Larry KannerJohn Andrade    MR#:  [id number]  DATE OF BIRTH:  [birth year]  SUBJECTIVE:  CHIEF COMPLAINT:   Chief Complaint  Patient presents with  . Leg Pain   -Acute gouty arthritis, significant pain in multiple joints-worsened left knee and both ankles.  REVIEW OF SYSTEMS:  Review of Systems  Constitutional: Negative for chills, fever and malaise/fatigue.  HENT: Negative for congestion, ear discharge and nosebleeds.   Eyes: Negative for blurred vision and double vision.  Respiratory: Negative for cough, shortness of breath and wheezing.   Cardiovascular: Positive for leg swelling. Negative for chest pain and palpitations.  Gastrointestinal: Negative for abdominal pain, constipation, diarrhea, nausea and vomiting.  Genitourinary: Negative for dysuria.  Musculoskeletal: Positive for joint pain and myalgias.  Neurological: Negative for dizziness, sensory change, speech change, focal weakness, seizures and headaches.  Psychiatric/Behavioral: Negative for depression.    DRUG ALLERGIES:   Allergies  Allergen Reactions  . Penicillins Rash    Has patient had a PCN reaction causing immediate rash, facial/tongue/throat swelling, SOB or lightheadedness with hypotension: No Has patient had a PCN reaction causing severe rash involving mucus membranes or skin necrosis: No Has patient had a PCN reaction that required hospitalization: No Has patient had a PCN reaction occurring within the last 10 years: No If all of the above answers are "NO", then may proceed with Cephalosporin use.     VITALS:  Blood pressure (!) 147/90, pulse 88, temperature (!) 97.5 F (36.4 C), temperature source Oral, resp. rate 20, height 6\' 2"  (1.88 m), weight 78.8 kg (173 lb 11.2 oz), SpO2 99 %.  PHYSICAL EXAMINATION:  Physical Exam  GENERAL:  56 y.o.-year-old patient lying in the bed with no acute distress.  EYES: Pupils equal, round, reactive to  light and accommodation. No scleral icterus. Extraocular muscles intact.  HEENT: Head atraumatic, normocephalic. Oropharynx and nasopharynx clear.  NECK:  Supple, no jugular venous distention. No thyroid enlargement, no tenderness.  LUNGS: Normal breath sounds bilaterally, decreased bibasilar breath sounds, no wheezing, rales,rhonchi or crepitation. No use of accessory muscles of respiration.  CARDIOVASCULAR: S1, S2 normal. No murmurs, rubs, or gallops.  ABDOMEN: Soft, nontender, nondistended. Bowel sounds present. No organomegaly or mass.  EXTREMITIES: Bilateral ankle edema noted. Swelling of left knee with tenderness, no erythema. No pedal edema, cyanosis, or clubbing. Left elbow tophi noted NEUROLOGIC: Cranial nerves II through XII are intact. Muscle strength 5/5 in all extremities. Sensation intact. Gait not checked.  PSYCHIATRIC: The patient is alert and oriented x 3.  SKIN: No obvious rash, lesion, or ulcer.    LABORATORY PANEL:   CBC  Recent Labs Lab 02/23/17 0525  WBC 11.6*  HGB 8.8*  HCT 25.8*  PLT 710*   ------------------------------------------------------------------------------------------------------------------  Chemistries   Recent Labs Lab 02/22/17 0920 02/23/17 0525  NA 139 135  K 5.3* 5.1  CL 108 107  CO2 20* 20*  GLUCOSE 94 158*  BUN 45* 43*  CREATININE 2.08* 1.45*  CALCIUM 10.8* 9.4  AST 30  --   ALT 17  --   ALKPHOS 329*  --   BILITOT 0.6  --    ------------------------------------------------------------------------------------------------------------------  Cardiac Enzymes No results for input(s): TROPONINI in the last 168 hours. ------------------------------------------------------------------------------------------------------------------  RADIOLOGY:  Dg Ankle Complete Left  Result Date: 02/22/2017 CLINICAL DATA:  Left hip and foot pain for 2 weeks, cannot bear weight EXAM: LEFT ANKLE COMPLETE - 3+ VIEW COMPARISON:  None. FINDINGS:  No  acute fracture is seen. The ankle joint appears normal and alignment is normal. However there do appear to be erosions involving the tip of the distal fibula as well as posterior malleolus with ankle joint space effusion present. Is there history of arthritis such as gout? Some degenerative changes noted in the midfoot. IMPRESSION: 1. Possible erosions involving the distal left fibula and posterior malleolus with ankle joint effusion. Question arthritis such as gout. 2. No fracture Electronically Signed   By: Dwyane Dee M.D.   On: 02/22/2017 09:24   Dg Knee Complete 4 Views Left  Result Date: 02/22/2017 CLINICAL DATA:  Left hip and foot pain for 2 weeks, cannot bear weight EXAM: LEFT KNEE - COMPLETE 4+ VIEW COMPARISON:  None. FINDINGS: There is mild tricompartmental degenerative joint disease of the left knee with some loss of joint space. No fracture is seen. However there does appear to be a moderate size left knee joint effusion present. Arthritis would be a definite consideration. IMPRESSION: 1. No fracture. 2. Left knee joint effusion with moderate tricompartmental degenerative joint disease. Cannot exclude arthritis. Electronically Signed   By: Dwyane Dee M.D.   On: 02/22/2017 09:26   Dg Foot Complete Left  Result Date: 02/22/2017 CLINICAL DATA:  Left foot pain EXAM: LEFT FOOT - COMPLETE 3+ VIEW COMPARISON:  None. FINDINGS: No acute fracture or dislocation. Mild osteoarthritis of the first MTP joint. Mild osteoarthritis of the first TMT joint. Mild osteoarthritis of the talonavicular joint. Mild osteoarthritis of the subtalar joint. Large ankle joint effusion. Large posterior subtalar joint effusion. IMPRESSION: 1.  No acute osseous injury of the left foot. 2. Osteoarthritis of the left foot as described above. 3. Large joint effusions of the ankle and posterior subtalar joint. Electronically Signed   By: Elige Ko   On: 02/22/2017 09:24    EKG:  No orders found for this or any previous  visit.  ASSESSMENT AND PLAN:   56 year old male with diastolic CHF, cirrhosis of the liver, GERD, gout, hypertension presents to hospital secondary to polymyalgia and left knee swelling.  #1 acute gouty arthritis-has had gout for a long time, taking colchicine every day. -Uric acid elevated and had significant left knee swelling and tenderness. -Appreciate rheumatology consult. Status post arthrocentesis of left knee with any percent neutrophils, no Gram stain organisms noted at this time. So we'll hold off antibiotics -Continue oral prednisone, daily colchicine for now and increase allopurinol. -Follow up uric acid in a.m., encourage ambulation, pain medications for now  #2 hypertension-will hold off on lisinopril due to his potassium levels. Start Norvasc  #3 acute renal failure-known CK D stage II with creatinine around 1.2 at baseline. -Continue gentle hydration. Prerenal due to dehydration on admission.  #4 hypercalcemia-Calcium and alkaline phosphatase are elevated, check PSA and PTH levels  #5 anemia-Baseline hemoglobin around 9-10, elevated MCV. Check anemia panel -Hemoglobin at 8.8. No acute indication for transfusion. -Monitor thrombocytosis  #6 tobacco use disorder-on nicotine patch  #7 DVT prophylaxis-Lovenox   All the records are reviewed and case discussed with Care Management/Social Workerr. Management plans discussed with the patient, family and they are in agreement.  CODE STATUS: Full code  TOTAL TIME TAKING CARE OF THIS PATIENT: 36 minutes.   POSSIBLE D/C tomorrow, DEPENDING ON CLINICAL CONDITION.   Jaleea Alesi M.D on 02/23/2017 at 9:08 AM  Between 7am to 6pm - Pager - [phone]  After 6pm go to [email or web address] - Social research officer, government  [employer]  [phone]  CC: Primary care physician; Pricilla Holm, MD

## 2017-02-24 LAB — BASIC METABOLIC PANEL
Anion gap: 7 (ref 5–15)
BUN: 39 mg/dL — AB (ref 6–20)
CHLORIDE: 106 mmol/L (ref 101–111)
CO2: 22 mmol/L (ref 22–32)
CREATININE: 1.08 mg/dL (ref 0.61–1.24)
Calcium: 9.1 mg/dL (ref 8.9–10.3)
GFR calc Af Amer: >60 mL/min (ref >60)
GFR calc non Af Amer: >60 mL/min (ref >60)
Glucose, Bld: 151 mg/dL — ABNORMAL HIGH (ref 65–99)
POTASSIUM: 4.8 mmol/L (ref 3.5–5.1)
Sodium: 135 mmol/L (ref 135–145)

## 2017-02-24 LAB — URIC ACID: Uric Acid, Serum: 8.4 mg/dL — ABNORMAL HIGH (ref 4.4–7.6)

## 2017-02-24 LAB — CBC
HEMATOCRIT: 26.3 % — AB (ref 40.0–52.0)
HEMOGLOBIN: 9 g/dL — AB (ref 13.0–18.0)
MCH: 35.1 pg — AB (ref 26.0–34.0)
MCHC: 34.2 g/dL (ref 32.0–36.0)
MCV: 102.6 fL — ABNORMAL HIGH (ref 80.0–100.0)
Platelets: 724 10*3/uL — ABNORMAL HIGH (ref 150–440)
RBC: 2.57 MIL/uL — ABNORMAL LOW (ref 4.40–5.90)
RDW: 15 % — ABNORMAL HIGH (ref 11.5–14.5)
WBC: 11.2 10*3/uL — ABNORMAL HIGH (ref 3.8–10.6)

## 2017-02-24 LAB — PTH, INTACT AND CALCIUM
Calcium, Total (PTH): 9.4 mg/dL (ref 8.7–10.2)
PTH: 21 pg/mL (ref 15–65)

## 2017-02-24 LAB — PARATHYROID HORMONE, INTACT (NO CA): PTH: 25 pg/mL (ref 15–65)

## 2017-02-24 MED ORDER — HYDRALAZINE HCL 20 MG/ML IJ SOLN
10.0000 mg | INTRAMUSCULAR | Status: DC | PRN
  Administered 2017-02-24: 10 mg via INTRAVENOUS
  Filled 2017-02-24: qty 1

## 2017-02-24 MED ORDER — AMLODIPINE BESYLATE 10 MG PO TABS
10.0000 mg | ORAL_TABLET | Freq: Every day | ORAL | Status: DC
  Administered 2017-02-24: 10 mg via ORAL
  Filled 2017-02-24: qty 1

## 2017-02-24 MED ORDER — PREDNISONE 50 MG PO TABS: 50.0000 mg | ORAL_TABLET | Freq: Every day | ORAL | 0 refills | Status: DC

## 2017-02-24 MED ORDER — ALLOPURINOL 100 MG PO TABS: 100.0000 mg | ORAL_TABLET | Freq: Every day | ORAL | 2 refills | Status: DC

## 2017-02-24 MED ORDER — OXYCODONE HCL 5 MG PO TABS: 5.0000 mg | ORAL_TABLET | Freq: Four times a day (QID) | ORAL | 0 refills | Status: DC | PRN

## 2017-02-24 MED ORDER — AMLODIPINE BESYLATE 10 MG PO TABS: 10.0000 mg | ORAL_TABLET | Freq: Every day | ORAL | 2 refills | Status: DC

## 2017-02-24 NOTE — Discharge Summary (Signed)
[facility] - [place] at [facility]   PATIENT NAME: Larry Andrade    MR#:  [id number]  DATE OF BIRTH:  1960-10-10  DATE OF ADMISSION:  02/22/2017   ADMITTING PHYSICIAN: Alford Highland, MD  DATE OF DISCHARGE: 02/24/2017  PRIMARY CARE PHYSICIAN: Pricilla Holm, MD   ADMISSION DIAGNOSIS:   Gout flare [M10.9] Acute idiopathic gout, unspecified site [M10.00]  DISCHARGE DIAGNOSIS:   Active Problems:   Gout attack   SECONDARY DIAGNOSIS:   Past Medical History:  Diagnosis Date  . CHF (congestive heart failure) (HCC)   . Cirrhosis (HCC)   . GERD (gastroesophageal reflux disease)   . Gout   . Hypertension   . Pancreatitis     HOSPITAL COURSE:   56 year old male with diastolic CHF, cirrhosis of the liver, GERD, gout, hypertension presents to hospital secondary to polymyalgia and left knee swelling.  #1 Acute gouty arthritis-has had gout for a long time,  -Uric acid elevated and had significant left knee swelling and tenderness on admission -Appreciate rheumatology consult. Status post arthrocentesis of left knee with no Gram stain organisms noted at this time. Negative cultures. No antibiotics at this time -Continue oral prednisone, daily colchicine and allopurinol. -uric acid is improving. At 8.3 today. Encouraged to decrease alcohol consumption and also information given about  #2 hypertension-will hold off on lisinopril due to his potassium levels. Started Norvasc  #3 acute renal failure-known CK D stage II with creatinine around 1.2 at baseline. -Continue gentle hydration.  -improved renal function with IV fluids. Advised not to take anymore ibuprofen at home.  #4 hypercalcemia-Calcium and alkaline phosphatase are elevated -improved now with fluids. PSA within normal limits. PTH is pending  #5 anemia-Baseline hemoglobin around 9-10, elevated MCV. Anemia panel with borderline low B12 levels. Recommend supplementation orally with over the  counter medications -Hemoglobin at 8.8. No acute indication for transfusion. -Monitor thrombocytosis  #6 tobacco use disorder-counseled against smoking while in the hospital.  Patient is able to ambulate with minimal pain. He will need to follow up with rheumatology as outpatient. -Physical therapy recommended a walker  DISCHARGE CONDITIONS:   guarded CONSULTS OBTAINED:   Treatment Team:  Kandyce Rud., MD  DRUG ALLERGIES:   Allergies  Allergen Reactions  . Penicillins Rash    Has patient had a PCN reaction causing immediate rash, facial/tongue/throat swelling, SOB or lightheadedness with hypotension: No Has patient had a PCN reaction causing severe rash involving mucus membranes or skin necrosis: No Has patient had a PCN reaction that required hospitalization: No Has patient had a PCN reaction occurring within the last 10 years: No If all of the above answers are "NO", then may proceed with Cephalosporin use.    DISCHARGE MEDICATIONS:   Allergies as of 02/24/2017      Reactions   Penicillins Rash   Has patient had a PCN reaction causing immediate rash, facial/tongue/throat swelling, SOB or lightheadedness with hypotension: No Has patient had a PCN reaction causing severe rash involving mucus membranes or skin necrosis: No Has patient had a PCN reaction that required hospitalization: No Has patient had a PCN reaction occurring within the last 10 years: No If all of the above answers are "NO", then may proceed with Cephalosporin use.      Medication List    STOP taking these medications   lisinopril 40 MG tablet Commonly known as:  PRINIVIL,ZESTRIL     TAKE these medications   allopurinol 100 MG tablet Commonly known as:  ZYLOPRIM Take 1 tablet (100 mg total) by mouth daily.   amLODipine 10 MG tablet Commonly known as:  NORVASC Take 1 tablet (10 mg total) by mouth daily.   colchicine 0.6 MG tablet Take 0.6-1.2 mg by mouth daily.   oxyCODONE 5 MG  immediate release tablet Commonly known as:  Oxy IR/ROXICODONE Take 1 tablet (5 mg total) by mouth every 6 (six) hours as needed for moderate pain or severe pain.   predniSONE 50 MG tablet Commonly known as:  DELTASONE Take 1 tablet (50 mg total) by mouth daily with breakfast. X 10 days            Discharge Care Instructions        Start     Ordered   02/25/17 0000  allopurinol (ZYLOPRIM) 100 MG tablet  Daily     02/24/17 0835   02/25/17 0000  amLODipine (NORVASC) 10 MG tablet  Daily     02/24/17 0835   02/25/17 0000  predniSONE (DELTASONE) 50 MG tablet  Daily with breakfast     02/24/17 0835   02/24/17 0000  oxyCODONE (OXY IR/ROXICODONE) 5 MG immediate release tablet  Every 6 hours PRN     02/24/17 0835   02/24/17 0000  Diet - low sodium heart healthy     02/24/17 0835   02/24/17 0000  Activity as tolerated - No restrictions     02/24/17 0835       DISCHARGE INSTRUCTIONS:   1. Rheumatology follow-up in 1-2 weeks 2. PCP follow-up in 2 weeks  DIET:   Cardiac diet  ACTIVITY:   Activity as tolerated  OXYGEN:   Home Oxygen: No.  Oxygen Delivery: room air  DISCHARGE LOCATION:   Home  If you experience worsening of your admission symptoms, develop shortness of breath, life threatening emergency, suicidal or homicidal thoughts you must seek medical attention immediately by calling 911 or calling your MD immediately  if symptoms less severe.  You Must read complete instructions/literature along with all the possible adverse reactions/side effects for all the Medicines you take and that have been prescribed to you. Take any new Medicines after you have completely understood and accpet all the possible adverse reactions/side effects.   Please note  You were cared for by a hospitalist during your hospital stay. If you have any questions about your discharge medications or the care you received while you were in the hospital after you are discharged, you can call the  unit and asked to speak with the hospitalist on call if the hospitalist that took care of you is not available. Once you are discharged, your primary care physician will handle any further medical issues. Please note that NO REFILLS for any discharge medications will be authorized once you are discharged, as it is imperative that you return to your primary care physician (or establish a relationship with a primary care physician if you do not have one) for your aftercare needs so that they can reassess your need for medications and monitor your lab values.    On the day of Discharge:  VITAL SIGNS:   Blood pressure (!) 156/99, pulse 97, temperature (!) 97.5 F (36.4 C), temperature source Oral, resp. rate 20, height 6\' 2"  (1.88 m), weight 78.8 kg (173 lb 11.2 oz), SpO2 100 %.  PHYSICAL EXAMINATION:    GENERAL:  56 y.o.-year-old patient lying in the bed with no acute distress.  EYES: Pupils equal, round, reactive to light and accommodation. No scleral icterus. Extraocular muscles  intact.  HEENT: Head atraumatic, normocephalic. Oropharynx and nasopharynx clear.  NECK:  Supple, no jugular venous distention. No thyroid enlargement, no tenderness.  LUNGS: Normal breath sounds bilaterally, decreased bibasilar breath sounds, no wheezing, rales,rhonchi or crepitation. No use of accessory muscles of respiration.  CARDIOVASCULAR: S1, S2 normal. No murmurs, rubs, or gallops.  ABDOMEN: Soft, nontender, nondistended. Bowel sounds present. No organomegaly or mass.  EXTREMITIES: Bilateral ankle edema noted. Swelling of left knee with tenderness, no erythema. No pedal edema, cyanosis, or clubbing. Left elbow tophi noted NEUROLOGIC: Cranial nerves II through XII are intact. Muscle strength 5/5 in all extremities. Sensation intact. Gait not checked.  PSYCHIATRIC: The patient is alert and oriented x 3.  SKIN: No obvious rash, lesion, or ulcer.   DATA REVIEW:   CBC  Recent Labs Lab 02/24/17 0430  WBC  11.2*  HGB 9.0*  HCT 26.3*  PLT 724*    Chemistries   Recent Labs Lab 02/22/17 0920  02/24/17 0430  NA 139  < > 135  K 5.3*  < > 4.8  CL 108  < > 106  CO2 20*  < > 22  GLUCOSE 94  < > 151*  BUN 45*  < > 39*  CREATININE 2.08*  < > 1.08  CALCIUM 10.8*  < > 9.1  AST 30  --   --   ALT 17  --   --   ALKPHOS 329*  --   --   BILITOT 0.6  --   --   < > = values in this interval not displayed.   Microbiology Results  Results for orders placed or performed during the hospital encounter of 02/22/17  Body fluid culture     Status: None (Preliminary result)   Collection Time: 02/22/17  5:25 PM  Result Value Ref Range Status   Specimen Description SYNOVIAL  Final   Special Requests LEFT KNEE  Final   Gram Stain   Final    ABUNDANT WBC PRESENT, PREDOMINANTLY PMN NO ORGANISMS SEEN    Culture   Final    NO GROWTH < 24 HOURS Performed at [facility], [address], [place], [place] [address]    Report Status PENDING  Incomplete    RADIOLOGY:  No results found.   Management plans discussed with the patient, family and they are in agreement.  CODE STATUS:     Code Status Orders        Start     Ordered   02/22/17 1121  Full code  Continuous     02/22/17 1122    Code Status History    Date Active Date Inactive Code Status Order ID Comments User Context   This patient has a current code status but no historical code status.      TOTAL TIME TAKING CARE OF THIS PATIENT: 37 minutes.    Genese Quebedeaux M.D on 02/24/2017 at 8:35 AM  Between 7am to 6pm - Pager - [phone]  After 6pm go to [email or web address] - Scientist, research (life sciences) [facility]  Office  [phone]  CC: Primary care physician; Pricilla Holm, MD   Note: This dictation was prepared with Dragon dictation along with smaller phrase technology. Any transcriptional errors that result from this process are unintentional.

## 2017-02-24 NOTE — Progress Notes (Signed)
Pt being discharged home, discharge instructions and prescriptions reviewed with pt, states understanding, pt with no complaints, awaiting transportation for discharge

## 2017-02-24 NOTE — Progress Notes (Signed)
Patient refuses bed alarm at this time, patient educated.

## 2017-02-26 LAB — BODY FLUID CULTURE: Culture: NO GROWTH

## 2017-10-24 ENCOUNTER — Other Ambulatory Visit: Payer: Self-pay

## 2017-10-24 ENCOUNTER — Encounter: Payer: Self-pay | Admitting: Emergency Medicine

## 2017-10-24 ENCOUNTER — Emergency Department: Payer: Medicare Other

## 2017-10-24 ENCOUNTER — Inpatient Hospital Stay
Admission: EM | Admit: 2017-10-24 | Discharge: 2017-10-26 | DRG: 381 | Disposition: A | Discharge status: Home / Self Care | Payer: Medicare Other | Attending: Internal Medicine | Admitting: Internal Medicine

## 2017-10-24 DIAGNOSIS — K920 Hematemesis: Secondary | ICD-10-CM

## 2017-10-24 DIAGNOSIS — F17200 Nicotine dependence, unspecified, uncomplicated: Secondary | ICD-10-CM | POA: Diagnosis present

## 2017-10-24 DIAGNOSIS — I509 Heart failure, unspecified: Secondary | ICD-10-CM | POA: Diagnosis present

## 2017-10-24 DIAGNOSIS — R1084 Generalized abdominal pain: Secondary | ICD-10-CM

## 2017-10-24 DIAGNOSIS — D72829 Elevated white blood cell count, unspecified: Secondary | ICD-10-CM | POA: Diagnosis present

## 2017-10-24 DIAGNOSIS — D62 Acute posthemorrhagic anemia: Secondary | ICD-10-CM

## 2017-10-24 DIAGNOSIS — F10129 Alcohol abuse with intoxication, unspecified: Secondary | ICD-10-CM | POA: Diagnosis present

## 2017-10-24 DIAGNOSIS — K2210 Ulcer of esophagus without bleeding: Principal | ICD-10-CM | POA: Diagnosis present

## 2017-10-24 DIAGNOSIS — K449 Diaphragmatic hernia without obstruction or gangrene: Secondary | ICD-10-CM | POA: Diagnosis present

## 2017-10-24 DIAGNOSIS — K760 Fatty (change of) liver, not elsewhere classified: Secondary | ICD-10-CM | POA: Diagnosis present

## 2017-10-24 DIAGNOSIS — K7460 Unspecified cirrhosis of liver: Secondary | ICD-10-CM | POA: Diagnosis present

## 2017-10-24 DIAGNOSIS — K269 Duodenal ulcer, unspecified as acute or chronic, without hemorrhage or perforation: Secondary | ICD-10-CM | POA: Diagnosis present

## 2017-10-24 DIAGNOSIS — E872 Acidosis, unspecified: Secondary | ICD-10-CM | POA: Diagnosis present

## 2017-10-24 DIAGNOSIS — M109 Gout, unspecified: Secondary | ICD-10-CM | POA: Diagnosis present

## 2017-10-24 DIAGNOSIS — E876 Hypokalemia: Secondary | ICD-10-CM | POA: Diagnosis present

## 2017-10-24 DIAGNOSIS — Z79899 Other long term (current) drug therapy: Secondary | ICD-10-CM

## 2017-10-24 DIAGNOSIS — Y902 Blood alcohol level of 40-59 mg/100 ml: Secondary | ICD-10-CM | POA: Diagnosis present

## 2017-10-24 DIAGNOSIS — K529 Noninfective gastroenteritis and colitis, unspecified: Secondary | ICD-10-CM | POA: Diagnosis present

## 2017-10-24 DIAGNOSIS — Z880 Allergy status to penicillin: Secondary | ICD-10-CM

## 2017-10-24 DIAGNOSIS — K219 Gastro-esophageal reflux disease without esophagitis: Secondary | ICD-10-CM | POA: Diagnosis present

## 2017-10-24 DIAGNOSIS — Z9049 Acquired absence of other specified parts of digestive tract: Secondary | ICD-10-CM

## 2017-10-24 DIAGNOSIS — I110 Hypertensive heart disease with heart failure: Secondary | ICD-10-CM | POA: Diagnosis present

## 2017-10-24 DIAGNOSIS — Z8719 Personal history of other diseases of the digestive system: Secondary | ICD-10-CM

## 2017-10-24 LAB — URINALYSIS, COMPLETE (UACMP) WITH MICROSCOPIC
Bacteria, UA: NONE SEEN
Glucose, UA: NEGATIVE mg/dL
Ketones, ur: 5 mg/dL — AB
LEUKOCYTES UA: NEGATIVE
Nitrite: NEGATIVE
PH: 5 (ref 5.0–8.0)
Protein, ur: 30 mg/dL — AB
SPECIFIC GRAVITY, URINE: 1.027 (ref 1.005–1.030)
Squamous Epithelial / LPF: NONE SEEN (ref 0–5)

## 2017-10-24 LAB — CBC WITH DIFFERENTIAL/PLATELET
BASOS ABS: 0.2 10*3/uL — AB (ref 0–0.1)
Basophils Relative: 1 %
Eosinophils Absolute: 0.2 10*3/uL (ref 0–0.7)
Eosinophils Relative: 2 %
HEMATOCRIT: 31 % — AB (ref 40.0–52.0)
Hemoglobin: 11 g/dL — ABNORMAL LOW (ref 13.0–18.0)
LYMPHS PCT: 14 %
Lymphs Abs: 2.2 10*3/uL (ref 1.0–3.6)
MCH: 42 pg — ABNORMAL HIGH (ref 26.0–34.0)
MCHC: 35.5 g/dL (ref 32.0–36.0)
MCV: 118.1 fL — AB (ref 80.0–100.0)
Monocytes Absolute: 1 10*3/uL (ref 0.2–1.0)
Monocytes Relative: 7 %
NEUTROS ABS: 11.7 10*3/uL — AB (ref 1.4–6.5)
Neutrophils Relative %: 76 %
PLATELETS: 361 10*3/uL (ref 150–440)
RBC: 2.63 MIL/uL — AB (ref 4.40–5.90)
RDW: 16.1 % — ABNORMAL HIGH (ref 11.5–14.5)
WBC: 15.4 10*3/uL — AB (ref 3.8–10.6)

## 2017-10-24 LAB — COMPREHENSIVE METABOLIC PANEL
ALT: 22 U/L (ref 17–63)
ANION GAP: 23 — AB (ref 5–15)
AST: 73 U/L — ABNORMAL HIGH (ref 15–41)
Albumin: 3 g/dL — ABNORMAL LOW (ref 3.5–5.0)
Alkaline Phosphatase: 285 U/L — ABNORMAL HIGH (ref 38–126)
BUN: 12 mg/dL (ref 6–20)
CHLORIDE: 88 mmol/L — AB (ref 101–111)
CO2: 25 mmol/L (ref 22–32)
Calcium: 8.4 mg/dL — ABNORMAL LOW (ref 8.9–10.3)
Creatinine, Ser: 1.2 mg/dL (ref 0.61–1.24)
GFR calc non Af Amer: >60 mL/min (ref >60)
Glucose, Bld: 102 mg/dL — ABNORMAL HIGH (ref 65–99)
POTASSIUM: 2.9 mmol/L — AB (ref 3.5–5.1)
Sodium: 136 mmol/L (ref 135–145)
Total Bilirubin: 1.1 mg/dL (ref 0.3–1.2)
Total Protein: 7.2 g/dL (ref 6.5–8.1)

## 2017-10-24 LAB — URINE DRUG SCREEN, QUALITATIVE (ARMC ONLY)
Amphetamines, Ur Screen: NOT DETECTED
Barbiturates, Ur Screen: NOT DETECTED
Benzodiazepine, Ur Scrn: NOT DETECTED
COCAINE METABOLITE, UR ~~LOC~~: NOT DETECTED
Cannabinoid 50 Ng, Ur ~~LOC~~: NOT DETECTED
MDMA (ECSTASY) UR SCREEN: NOT DETECTED
METHADONE SCREEN, URINE: NOT DETECTED
OPIATE, UR SCREEN: NOT DETECTED
Phencyclidine (PCP) Ur S: NOT DETECTED
Tricyclic, Ur Screen: NOT DETECTED

## 2017-10-24 LAB — LACTIC ACID, PLASMA
LACTIC ACID, VENOUS: 7.1 mmol/L — AB (ref 0.5–1.9)
Lactic Acid, Venous: 9.8 mmol/L (ref 0.5–1.9)

## 2017-10-24 LAB — LIPASE, BLOOD: Lipase: 47 U/L (ref 11–51)

## 2017-10-24 LAB — ETHANOL: ALCOHOL ETHYL (B): 57 mg/dL — AB (ref <10)

## 2017-10-24 MED ORDER — HALOPERIDOL LACTATE 5 MG/ML IJ SOLN
2.5000 mg | Freq: Once | INTRAMUSCULAR | Status: AC
Start: 2017-10-24 — End: 2017-10-24
  Administered 2017-10-24: 2.5 mg via INTRAVENOUS
  Filled 2017-10-24: qty 1

## 2017-10-24 MED ORDER — SODIUM CHLORIDE 0.9 % IV BOLUS
1000.0000 mL | Freq: Once | INTRAVENOUS | Status: AC
Start: 2017-10-24 — End: 2017-10-25
  Administered 2017-10-25: 1000 mL via INTRAVENOUS

## 2017-10-24 MED ORDER — SODIUM CHLORIDE 0.9 % IV BOLUS
1000.0000 mL | Freq: Once | INTRAVENOUS | Status: AC
  Administered 2017-10-24: 1000 mL via INTRAVENOUS

## 2017-10-24 MED ORDER — CEFTRIAXONE SODIUM 1 G IJ SOLR
1.0000 g | Freq: Once | INTRAMUSCULAR | Status: AC
  Administered 2017-10-24: 1 g via INTRAVENOUS
  Filled 2017-10-24: qty 10

## 2017-10-24 MED ORDER — IOPAMIDOL (ISOVUE-370) INJECTION 76%
75.0000 mL | Freq: Once | INTRAVENOUS | Status: AC | PRN
  Administered 2017-10-24: 75 mL via INTRAVENOUS

## 2017-10-24 MED ORDER — FENTANYL CITRATE (PF) 100 MCG/2ML IJ SOLN
75.0000 ug | Freq: Once | INTRAMUSCULAR | Status: AC
  Administered 2017-10-24: 75 ug via INTRAVENOUS
  Filled 2017-10-24: qty 2

## 2017-10-24 NOTE — ED Notes (Signed)
Pt to room 3 via w/c by EDT Scott to be placed on card monitor for EKG and further eval

## 2017-10-24 NOTE — ED Notes (Signed)
Pt reports vomiting x 4 today.  No diarrhea.  Pt reports feeling lightheaded.  No chest pain or sob.    Pt reports drinking etoh every day.  md at bedside. Family with pt.  Sinus tach on monitor.

## 2017-10-24 NOTE — ED Triage Notes (Signed)
Pt to triage via w/c with no distress noted; reports N/V since Saturday with bloody emesis; denies pain

## 2017-10-24 NOTE — ED Provider Notes (Signed)
[facility] Emergency Department Provider Note  ____________________________________________   First MD Initiated Contact with Patient 10/24/17 2004     (approximate)  I have reviewed the triage vital signs and the nursing notes.   HISTORY  Chief Complaint Emesis   HPI Larry Andrade. is a 57 y.o. male who comes to the emergency department with roughly 2 days of epigastric moderate to severe cramping nonradiating pain along with nausea and vomiting.  No diarrhea.  The symptoms are intermittent.  Seems to be somewhat worsened when trying to eat and somewhat improved when not.  He drinks alcohol every day and most recently drank about 5 hours ago.  He does have a previous surgical history of colectomy secondary to a mass.  Past Medical History:  Diagnosis Date  . CHF (congestive heart failure) (HCC)   . Cirrhosis (HCC)   . GERD (gastroesophageal reflux disease)   . Gout   . Hypertension   . Pancreatitis     Patient Active Problem List   Diagnosis Date Noted  . Lactic acid acidosis 10/25/2017  . Generalized abdominal pain   . Hematemesis with nausea   . Acute posthemorrhagic anemia   . Gout attack 02/22/2017    Past Surgical History:  Procedure Laterality Date  . CARDIAC CATHETERIZATION    . COLON SURGERY      Prior to Admission medications   Medication Sig Start Date End Date Taking? Authorizing Provider  allopurinol (ZYLOPRIM) 100 MG tablet Take 1 tablet (100 mg total) by mouth daily. 02/25/17  Yes Enid Baas, MD  amLODipine (NORVASC) 10 MG tablet Take 1 tablet (10 mg total) by mouth daily. 02/25/17  Yes Enid Baas, MD  colchicine 0.6 MG tablet Take 0.6-1.2 mg by mouth daily.   Yes [provider]  omeprazole (PRILOSEC) 20 MG capsule Take 20 mg by mouth daily.   Yes [provider]  oxyCODONE (OXY IR/ROXICODONE) 5 MG immediate release tablet Take 5 mg by mouth every 6 (six) hours as needed for severe pain.    Yes [provider]  oxyCODONE (OXY IR/ROXICODONE) 5 MG immediate release tablet Take 1 tablet (5 mg total) by mouth every 6 (six) hours as needed for moderate pain or severe pain. Patient not taking: Reported on 10/24/2017 02/24/17   Enid Baas, MD  predniSONE (DELTASONE) 50 MG tablet Take 1 tablet (50 mg total) by mouth daily with breakfast. X 10 days Patient not taking: Reported on 10/24/2017 02/25/17   Enid Baas, MD    Allergies Penicillins  Family History  Problem Relation Age of Onset  . CVA Mother   . CAD Mother   . CAD Father     Social History Social History   Tobacco Use  . Smoking status: Current Every Day Smoker  . Smokeless tobacco: Never Used  Substance Use Topics  . Alcohol use: No    Comment: former  . Drug use: No    Review of Systems Constitutional: No fever/chills Eyes: No visual changes. ENT: No sore throat. Cardiovascular: Denies chest pain. Respiratory: Denies shortness of breath. Gastrointestinal: Positive for abdominal pain.  Positive for nausea, positive for vomiting.  No diarrhea.  No constipation. Genitourinary: Negative for dysuria. Musculoskeletal: Negative for back pain. Skin: Negative for rash. Neurological: Negative for headaches, focal weakness or numbness.   ____________________________________________   PHYSICAL EXAM:  VITAL SIGNS: ED Triage Vitals  Enc Vitals Group     BP 10/24/17 1959 118/84     Pulse Rate 10/24/17  1959 (!) 130     Resp 10/24/17 1959 20     Temp 10/24/17 1959 97.7 F (36.5 C)     Temp Source 10/24/17 1959 Oral     SpO2 10/24/17 1959 97 %     Weight 10/24/17 1958 200 lb (90.7 kg)     Height 10/24/17 1958  (1.854 m)     Head Circumference --      Peak Flow --      Pain Score 10/24/17 1958 0     Pain Loc --      Pain Edu? --      Excl. in GC? --     Constitutional: Alert and oriented x4 uncomfortable appearing nontoxic no diaphoresis speaks in full clear sentences Eyes:  PERRL EOMI. midrange and brisk Head: Atraumatic. Nose: No congestion/rhinnorhea. Mouth/Throat: No trismus no tongue fasciculations Neck: No stridor.   Cardiovascular: Tachycardic rate, regular rhythm. Grossly normal heart sounds.  Good peripheral circulation. Respiratory: Normal respiratory effort.  No retractions. Lungs CTAB and moving good air Gastrointestinal: Soft abdomen no frank peritonitis but diffusely tender to palpation with no focality Musculoskeletal: No lower extremity edema no hand tremors neurologic:  Normal speech and language. No gross focal neurologic deficits are appreciated. Skin:  Skin is warm, dry and intact. No rash noted. Psychiatric: Mood and affect are normal. Speech and behavior are normal.    ____________________________________________   DIFFERENTIAL includes but not limited to  Small bowel obstruction, volvulus, appendicitis, diverticulitis, pancreatitis, gastritis ____________________________________________   LABS (all labs ordered are listed, but only abnormal results are displayed)  Labs Reviewed  COMPREHENSIVE METABOLIC PANEL - Abnormal; Notable for the following components:      Result Value   Potassium 2.9 (*)    Chloride 88 (*)    Glucose, Bld 102 (*)    Calcium 8.4 (*)    Albumin 3.0 (*)    AST 73 (*)    Alkaline Phosphatase 285 (*)    Anion gap 23 (*)    All other components within normal limits  CBC WITH DIFFERENTIAL/PLATELET - Abnormal; Notable for the following components:   WBC 15.4 (*)    RBC 2.63 (*)    Hemoglobin 11.0 (*)    HCT 31.0 (*)    MCV 118.1 (*)    MCH 42.0 (*)    RDW 16.1 (*)    Neutro Abs 11.7 (*)    Basophils Absolute 0.2 (*)    All other components within normal limits  LACTIC ACID, PLASMA - Abnormal; Notable for the following components:   Lactic Acid, Venous 9.8 (*)    All other components within normal limits  LACTIC ACID, PLASMA - Abnormal; Notable for the following components:   Lactic Acid, Venous 7.1  (*)    All other components within normal limits  ETHANOL - Abnormal; Notable for the following components:   Alcohol, Ethyl (B) 57 (*)    All other components within normal limits  URINALYSIS, COMPLETE (UACMP) WITH MICROSCOPIC - Abnormal; Notable for the following components:   Color, Urine AMBER (*)    APPearance CLEAR (*)    Hgb urine dipstick SMALL (*)    Bilirubin Urine SMALL (*)    Ketones, ur 5 (*)    Protein, ur 30 (*)    All other components within normal limits  BASIC METABOLIC PANEL - Abnormal; Notable for the following components:   Potassium 3.0 (*)    Chloride 96 (*)    Calcium 7.0 (*)  All other components within normal limits  CBC - Abnormal; Notable for the following components:   WBC 11.0 (*)    RBC 2.17 (*)    Hemoglobin 8.9 (*)    HCT 25.5 (*)    MCV 117.3 (*)    MCH 41.0 (*)    RDW 16.0 (*)    All other components within normal limits  LACTIC ACID, PLASMA - Abnormal; Notable for the following components:   Lactic Acid, Venous 2.0 (*)    All other components within normal limits  MAGNESIUM - Abnormal; Notable for the following components:   Magnesium 0.9 (*)    All other components within normal limits  CULTURE, BLOOD (ROUTINE X 2)  CULTURE, BLOOD (ROUTINE X 2)  LIPASE, BLOOD  URINE DRUG SCREEN, QUALITATIVE ([facility] ONLY)  GLUCOSE, CAPILLARY  LACTIC ACID, PLASMA  GLUCOSE, CAPILLARY  BASIC METABOLIC PANEL  MAGNESIUM    Lab work reviewed by me with lactic acid of 9.8 which is concerning for under resuscitation versus infection __________________________________________  EKG    ____________________________________________  RADIOLOGY  CT scan abdomen pelvis reviewed by me with no acute disease ____________________________________________   PROCEDURES  Procedure(s) performed: no  .Critical Care Performed by: Merrily Brittle, MD Authorized by: Merrily Brittle, MD   Critical care provider statement:    Critical care time (minutes):  30    Critical care time was exclusive of:  Separately billable procedures and treating other patients   Critical care was necessary to treat or prevent imminent or life-threatening deterioration of the following conditions:  Sepsis   Critical care was time spent personally by me on the following activities:  Development of treatment plan with patient or surrogate, discussions with consultants, evaluation of patient's response to treatment, examination of patient, obtaining history from patient or surrogate, ordering and performing treatments and interventions, ordering and review of laboratory studies, ordering and review of radiographic studies, pulse oximetry, re-evaluation of patient's condition and review of old charts    Critical Care performed: Yes  Observation: no ____________________________________________   INITIAL IMPRESSION / ASSESSMENT AND PLAN / ED COURSE  Pertinent labs & imaging results that were available during my care of the patient were reviewed by me and considered in my medical decision making (see chart for details).  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  IV fluids, IV Haldol for pain and nausea, and CT scan are pending.  Patient is n.p.o.    ----------------------------------------- 9:06 PM on 10/24/2017 -----------------------------------------  The patient's lactic acid came back at 9.8.  I am sending him to the scanner now without his creatinine given my high suspicion for an abdominal catastrophe.  ____________________________________________  The patient CT is fortunately negative for acute pathology however his lactic acid is elevated raising concern for bacteremia versus occult infection etc.  Broad spectrum antibiotics as well as blood cultures are pending now but he will require inpatient admission for continued work-up and resuscitation.  The patient verbalizes understanding and agree with the plan.  I  discussed with the hospitalist who has graciously agreed to the patient to her service.  FINAL CLINICAL IMPRESSION(S) / ED DIAGNOSES  Final diagnoses:  Lactic acidosis  Generalized abdominal pain      NEW MEDICATIONS STARTED DURING THIS VISIT:  Current Discharge Medication List       Note:  This document was prepared using Dragon voice recognition software and may include unintentional dictation errors.     Merrily Brittle, MD 10/26/17 [phone]

## 2017-10-25 ENCOUNTER — Other Ambulatory Visit: Payer: Self-pay

## 2017-10-25 DIAGNOSIS — E872 Acidosis, unspecified: Secondary | ICD-10-CM | POA: Diagnosis present

## 2017-10-25 DIAGNOSIS — R1084 Generalized abdominal pain: Secondary | ICD-10-CM | POA: Diagnosis not present

## 2017-10-25 DIAGNOSIS — I110 Hypertensive heart disease with heart failure: Secondary | ICD-10-CM | POA: Diagnosis present

## 2017-10-25 DIAGNOSIS — K7460 Unspecified cirrhosis of liver: Secondary | ICD-10-CM | POA: Diagnosis present

## 2017-10-25 DIAGNOSIS — F10129 Alcohol abuse with intoxication, unspecified: Secondary | ICD-10-CM | POA: Diagnosis present

## 2017-10-25 DIAGNOSIS — K2210 Ulcer of esophagus without bleeding: Secondary | ICD-10-CM | POA: Diagnosis present

## 2017-10-25 DIAGNOSIS — Z880 Allergy status to penicillin: Secondary | ICD-10-CM | POA: Diagnosis not present

## 2017-10-25 DIAGNOSIS — K920 Hematemesis: Secondary | ICD-10-CM | POA: Diagnosis not present

## 2017-10-25 DIAGNOSIS — K449 Diaphragmatic hernia without obstruction or gangrene: Secondary | ICD-10-CM | POA: Diagnosis present

## 2017-10-25 DIAGNOSIS — D62 Acute posthemorrhagic anemia: Secondary | ICD-10-CM

## 2017-10-25 DIAGNOSIS — K269 Duodenal ulcer, unspecified as acute or chronic, without hemorrhage or perforation: Secondary | ICD-10-CM | POA: Diagnosis not present

## 2017-10-25 DIAGNOSIS — E876 Hypokalemia: Secondary | ICD-10-CM | POA: Diagnosis present

## 2017-10-25 DIAGNOSIS — K760 Fatty (change of) liver, not elsewhere classified: Secondary | ICD-10-CM | POA: Diagnosis present

## 2017-10-25 DIAGNOSIS — K529 Noninfective gastroenteritis and colitis, unspecified: Secondary | ICD-10-CM | POA: Diagnosis present

## 2017-10-25 DIAGNOSIS — Z9049 Acquired absence of other specified parts of digestive tract: Secondary | ICD-10-CM | POA: Diagnosis not present

## 2017-10-25 DIAGNOSIS — F17200 Nicotine dependence, unspecified, uncomplicated: Secondary | ICD-10-CM | POA: Diagnosis present

## 2017-10-25 DIAGNOSIS — Y902 Blood alcohol level of 40-59 mg/100 ml: Secondary | ICD-10-CM | POA: Diagnosis present

## 2017-10-25 DIAGNOSIS — Z79899 Other long term (current) drug therapy: Secondary | ICD-10-CM | POA: Diagnosis not present

## 2017-10-25 DIAGNOSIS — I509 Heart failure, unspecified: Secondary | ICD-10-CM | POA: Diagnosis present

## 2017-10-25 DIAGNOSIS — D72829 Elevated white blood cell count, unspecified: Secondary | ICD-10-CM | POA: Diagnosis present

## 2017-10-25 DIAGNOSIS — M109 Gout, unspecified: Secondary | ICD-10-CM | POA: Diagnosis present

## 2017-10-25 DIAGNOSIS — Z8719 Personal history of other diseases of the digestive system: Secondary | ICD-10-CM | POA: Diagnosis not present

## 2017-10-25 DIAGNOSIS — K219 Gastro-esophageal reflux disease without esophagitis: Secondary | ICD-10-CM | POA: Diagnosis present

## 2017-10-25 LAB — BASIC METABOLIC PANEL
Anion gap: 10 (ref 5–15)
BUN: 11 mg/dL (ref 6–20)
CALCIUM: 7 mg/dL — AB (ref 8.9–10.3)
CHLORIDE: 96 mmol/L — AB (ref 101–111)
CO2: 30 mmol/L (ref 22–32)
CREATININE: 0.88 mg/dL (ref 0.61–1.24)
GFR calc Af Amer: >60 mL/min (ref >60)
GFR calc non Af Amer: >60 mL/min (ref >60)
GLUCOSE: 99 mg/dL (ref 65–99)
Potassium: 3 mmol/L — ABNORMAL LOW (ref 3.5–5.1)
Sodium: 136 mmol/L (ref 135–145)

## 2017-10-25 LAB — CBC
HCT: 25.5 % — ABNORMAL LOW (ref 40.0–52.0)
Hemoglobin: 8.9 g/dL — ABNORMAL LOW (ref 13.0–18.0)
MCH: 41 pg — AB (ref 26.0–34.0)
MCHC: 34.9 g/dL (ref 32.0–36.0)
MCV: 117.3 fL — AB (ref 80.0–100.0)
PLATELETS: 261 10*3/uL (ref 150–440)
RBC: 2.17 MIL/uL — ABNORMAL LOW (ref 4.40–5.90)
RDW: 16 % — ABNORMAL HIGH (ref 11.5–14.5)
WBC: 11 10*3/uL — ABNORMAL HIGH (ref 3.8–10.6)

## 2017-10-25 LAB — LACTIC ACID, PLASMA
LACTIC ACID, VENOUS: 2 mmol/L — AB (ref 0.5–1.9)
Lactic Acid, Venous: 1.8 mmol/L (ref 0.5–1.9)

## 2017-10-25 LAB — GLUCOSE, CAPILLARY
GLUCOSE-CAPILLARY: 98 mg/dL (ref 65–99)
Glucose-Capillary: 90 mg/dL (ref 65–99)

## 2017-10-25 LAB — MAGNESIUM: Magnesium: 0.9 mg/dL — CL (ref 1.7–2.4)

## 2017-10-25 MED ORDER — ACETAMINOPHEN 650 MG RE SUPP: 650.0000 mg | Freq: Four times a day (QID) | RECTAL | Status: DC | PRN

## 2017-10-25 MED ORDER — ALLOPURINOL 100 MG PO TABS
100.0000 mg | ORAL_TABLET | Freq: Every day | ORAL | Status: DC
  Administered 2017-10-25 – 2017-10-26 (×2): 100 mg via ORAL
  Filled 2017-10-25 (×2): qty 1

## 2017-10-25 MED ORDER — THIAMINE HCL 100 MG/ML IJ SOLN
Freq: Once | INTRAVENOUS | Status: AC
  Administered 2017-10-25: 02:00:00 via INTRAVENOUS
  Filled 2017-10-25: qty 1000

## 2017-10-25 MED ORDER — SODIUM CHLORIDE 0.9 % IV SOLN
INTRAVENOUS | Status: DC
  Administered 2017-10-25 – 2017-10-26 (×3): via INTRAVENOUS

## 2017-10-25 MED ORDER — NICOTINE 21 MG/24HR TD PT24
21.0000 mg | MEDICATED_PATCH | Freq: Every day | TRANSDERMAL | Status: DC
  Administered 2017-10-25 – 2017-10-26 (×2): 21 mg via TRANSDERMAL
  Filled 2017-10-25 (×2): qty 1

## 2017-10-25 MED ORDER — DOCUSATE SODIUM 100 MG PO CAPS
100.0000 mg | ORAL_CAPSULE | Freq: Two times a day (BID) | ORAL | Status: DC
  Filled 2017-10-25 (×2): qty 1

## 2017-10-25 MED ORDER — POTASSIUM CHLORIDE CRYS ER 20 MEQ PO TBCR
20.0000 meq | EXTENDED_RELEASE_TABLET | Freq: Once | ORAL | Status: AC
  Administered 2017-10-25: 20 meq via ORAL
  Filled 2017-10-25: qty 1

## 2017-10-25 MED ORDER — BISACODYL 5 MG PO TBEC: 5.0000 mg | DELAYED_RELEASE_TABLET | Freq: Every day | ORAL | Status: DC | PRN

## 2017-10-25 MED ORDER — POTASSIUM CHLORIDE CRYS ER 20 MEQ PO TBCR
40.0000 meq | EXTENDED_RELEASE_TABLET | Freq: Once | ORAL | Status: AC
  Administered 2017-10-25: 40 meq via ORAL
  Filled 2017-10-25: qty 2

## 2017-10-25 MED ORDER — ONDANSETRON HCL 4 MG PO TABS: 4.0000 mg | ORAL_TABLET | Freq: Four times a day (QID) | ORAL | Status: DC | PRN

## 2017-10-25 MED ORDER — ONDANSETRON HCL 4 MG/2ML IJ SOLN: 4.0000 mg | Freq: Four times a day (QID) | INTRAMUSCULAR | Status: DC | PRN

## 2017-10-25 MED ORDER — ADULT MULTIVITAMIN W/MINERALS CH
1.0000 | ORAL_TABLET | Freq: Every day | ORAL | Status: DC
  Administered 2017-10-25 – 2017-10-26 (×2): 1 via ORAL
  Filled 2017-10-25 (×2): qty 1

## 2017-10-25 MED ORDER — ACETAMINOPHEN 325 MG PO TABS: 650.0000 mg | ORAL_TABLET | Freq: Four times a day (QID) | ORAL | Status: DC | PRN

## 2017-10-25 MED ORDER — LORAZEPAM 2 MG/ML IJ SOLN: 0.0000 mg | Freq: Two times a day (BID) | INTRAMUSCULAR | Status: DC

## 2017-10-25 MED ORDER — LORAZEPAM 1 MG PO TABS: 1.0000 mg | ORAL_TABLET | Freq: Four times a day (QID) | ORAL | Status: DC | PRN

## 2017-10-25 MED ORDER — VITAMIN B-1 100 MG PO TABS
100.0000 mg | ORAL_TABLET | Freq: Every day | ORAL | Status: DC
  Administered 2017-10-25 – 2017-10-26 (×2): 100 mg via ORAL
  Filled 2017-10-25 (×2): qty 1

## 2017-10-25 MED ORDER — MAGNESIUM SULFATE 4 GM/100ML IV SOLN
4.0000 g | Freq: Once | INTRAVENOUS | Status: AC
  Administered 2017-10-25: 4 g via INTRAVENOUS
  Filled 2017-10-25: qty 100

## 2017-10-25 MED ORDER — FOLIC ACID 1 MG PO TABS
1.0000 mg | ORAL_TABLET | Freq: Every day | ORAL | Status: DC
  Administered 2017-10-25 – 2017-10-26 (×2): 1 mg via ORAL
  Filled 2017-10-25 (×2): qty 1

## 2017-10-25 MED ORDER — HEPARIN SODIUM (PORCINE) 5000 UNIT/ML IJ SOLN
5000.0000 [IU] | Freq: Three times a day (TID) | INTRAMUSCULAR | Status: DC
  Filled 2017-10-25 (×3): qty 1

## 2017-10-25 MED ORDER — LORAZEPAM 2 MG/ML IJ SOLN: 1.0000 mg | Freq: Four times a day (QID) | INTRAMUSCULAR | Status: DC | PRN

## 2017-10-25 MED ORDER — PANTOPRAZOLE SODIUM 40 MG PO TBEC
40.0000 mg | DELAYED_RELEASE_TABLET | Freq: Every day | ORAL | Status: DC
Start: 2017-10-25 — End: 2017-10-26
  Administered 2017-10-25 – 2017-10-26 (×2): 40 mg via ORAL
  Filled 2017-10-25 (×2): qty 1

## 2017-10-25 MED ORDER — LORAZEPAM 2 MG/ML IJ SOLN: 0.0000 mg | Freq: Four times a day (QID) | INTRAMUSCULAR | Status: DC

## 2017-10-25 MED ORDER — HYDROCODONE-ACETAMINOPHEN 5-325 MG PO TABS
1.0000 | ORAL_TABLET | ORAL | Status: DC | PRN
  Administered 2017-10-25 (×2): 2 via ORAL
  Administered 2017-10-25: 1 via ORAL
  Administered 2017-10-26 (×2): 2 via ORAL
  Filled 2017-10-25 (×3): qty 2
  Filled 2017-10-25: qty 1
  Filled 2017-10-25: qty 2

## 2017-10-25 MED ORDER — ALUM & MAG HYDROXIDE-SIMETH 200-200-20 MG/5ML PO SUSP
30.0000 mL | Freq: Four times a day (QID) | ORAL | Status: DC | PRN
  Administered 2017-10-25: 30 mL via ORAL
  Filled 2017-10-25: qty 30

## 2017-10-25 MED ORDER — AMLODIPINE BESYLATE 10 MG PO TABS
10.0000 mg | ORAL_TABLET | Freq: Every day | ORAL | Status: DC
Start: 2017-10-25 — End: 2017-10-26
  Administered 2017-10-25 – 2017-10-26 (×2): 10 mg via ORAL
  Filled 2017-10-25 (×2): qty 1

## 2017-10-25 NOTE — ED Notes (Signed)
Report called to melissa rn floor nurse 

## 2017-10-25 NOTE — Consult Note (Signed)
Midge Minium, MD [facility]  [address], [address] [place], [place] [address] Phone: [phone] Fax : [phone]  Consultation  Referring Provider:     Dr. Imogene Burn Primary Care Physician:  Pricilla Holm, MD Primary Gastroenterologist:  Gentry Fitz         Reason for Consultation:     Hematemesis  Date of Admission:  10/24/2017 Date of Consultation:  10/25/2017         HPI:   Larry Andrade. is a 57 y.o. male who reports that he was having epigastric pain with nausea and vomiting.  The patient has a history of a colon mass that he reports to be inflammatory and had it removed.  Since then the patient has had chronic diarrhea.  He reports that he vomited for a few days and did not come to the emergency room until he started vomiting blood yesterday. The patient has a history of alcohol abuse and states that he drinks quite frequently and has since he was 57 years old. The patient reports that since admission he has been doing well and has not had any further nausea vomiting or abdominal pain. The patient was found to have an elevated lactic acid at 9.8 that went down to 1.8 this morning.  The patient denies any black stools or bloody stools and denies his diarrhea that is chronic, is dark in color.  The patient's hemoglobin did drop from His admission of 11 down to 8.9.  The patient's hemoglobin back in August 2018 was 9.  The patient does have a history of tobacco use, GERD, hypertension, pancreatitis and CHF.  The patient was also noted to have an elevated white cell count at 15.4.The white cell count this morning was down to 11.  Past Medical History:  Diagnosis Date  . CHF (congestive heart failure) (HCC)   . Cirrhosis (HCC)   . GERD (gastroesophageal reflux disease)   . Gout   . Hypertension   . Pancreatitis     Past Surgical History:  Procedure Laterality Date  . CARDIAC CATHETERIZATION    . COLON SURGERY      Prior to Admission medications   Medication Sig Start Date End Date  Taking? Authorizing Provider  allopurinol (ZYLOPRIM) 100 MG tablet Take 1 tablet (100 mg total) by mouth daily. 02/25/17  Yes Enid Baas, MD  amLODipine (NORVASC) 10 MG tablet Take 1 tablet (10 mg total) by mouth daily. 02/25/17  Yes Enid Baas, MD  colchicine 0.6 MG tablet Take 0.6-1.2 mg by mouth daily.   Yes [provider]  omeprazole (PRILOSEC) 20 MG capsule Take 20 mg by mouth daily.   Yes [provider]  oxyCODONE (OXY IR/ROXICODONE) 5 MG immediate release tablet Take 5 mg by mouth every 6 (six) hours as needed for severe pain.   Yes [provider]  oxyCODONE (OXY IR/ROXICODONE) 5 MG immediate release tablet Take 1 tablet (5 mg total) by mouth every 6 (six) hours as needed for moderate pain or severe pain. Patient not taking: Reported on 10/24/2017 02/24/17   Enid Baas, MD  predniSONE (DELTASONE) 50 MG tablet Take 1 tablet (50 mg total) by mouth daily with breakfast. X 10 days Patient not taking: Reported on 10/24/2017 02/25/17   Enid Baas, MD    Family History  Problem Relation Age of Onset  . CVA Mother   . CAD Mother   . CAD Father      Social History   Tobacco Use  . Smoking  status: Current Every Day Smoker  . Smokeless tobacco: Never Used  Substance Use Topics  . Alcohol use: No    Comment: former  . Drug use: No    Allergies as of 10/24/2017 - Review Complete 10/24/2017  Allergen Reaction Noted  . Penicillins Rash 02/22/2017    Review of Systems:    All systems reviewed and negative except where noted in HPI.   Physical Exam:  Vital signs in last 24 hours: Temp:  [97.7 F (36.5 C)-99.8 F (37.7 C)] 98.6 F (37 C) (04/30 1205) Pulse Rate:  [102-130] 102 (04/30 1205) Resp:  [18-30] 20 (04/30 0426) BP: (118-139)/(71-96) 118/92 (04/30 1205) SpO2:  [91 %-100 %] 99 % (04/30 1205) Weight:  [189 lb 6 oz (85.9 kg)-200 lb (90.7 kg)] 189 lb 6 oz (85.9 kg) (04/30 0120) Last BM Date: 10/25/17 General:    Pleasant, cooperative in NAD Head:  Normocephalic and atraumatic. Eyes:   No icterus.   Conjunctiva pink. PERRLA. Ears:  Normal auditory acuity. Neck:  Supple; no masses or thyroidomegaly Lungs: Respirations even and unlabored. Lungs clear to auscultation bilaterally.   No wheezes, crackles, or rhonchi.  Heart:  Regular rate and rhythm;  Without murmur, clicks, rubs or gallops Abdomen:  Soft, nondistended, nontender. Normal bowel sounds. No appreciable masses or hepatomegaly.  No rebound or guarding.  Rectal:  Not performed. Msk:  Symmetrical without gross deformities.    Extremities:  Without edema, cyanosis or clubbing. Neurologic:  Alert and oriented x3;  grossly normal neurologically. Skin:  Intact without significant lesions or rashes. Cervical Nodes:  No significant cervical adenopathy. Psych:  Alert and cooperative. Normal affect.  LAB RESULTS: Recent Labs    10/24/17 2018 10/25/17 0500  WBC 15.4* 11.0*  HGB 11.0* 8.9*  HCT 31.0* 25.5*  PLT 361 261   BMET Recent Labs    10/24/17 2018 10/25/17 0500  NA 136 136  K 2.9* 3.0*  CL 88* 96*  CO2 25 30  GLUCOSE 102* 99  BUN 12 11  CREATININE 1.20 0.88  CALCIUM 8.4* 7.0*   LFT Recent Labs    10/24/17 2018  PROT 7.2  ALBUMIN 3.0*  AST 73*  ALT 22  ALKPHOS 285*  BILITOT 1.1   PT/INR No results for input(s): LABPROT, INR in the last 72 hours.  STUDIES: Ct Abdomen Pelvis W Contrast  Result Date: 10/24/2017 CLINICAL DATA:  Vomiting today.  Lightheaded.  Alcohol use. EXAM: CT ABDOMEN AND PELVIS WITH CONTRAST TECHNIQUE: Multidetector CT imaging of the abdomen and pelvis was performed using the standard protocol following bolus administration of intravenous contrast. CONTRAST:  75mL ISOVUE-370 IOPAMIDOL (ISOVUE-370) INJECTION 76% COMPARISON:  None. FINDINGS: Lower chest: The lung bases are clear. Coronary artery calcifications. Small esophageal hiatal hernia. Old left rib fractures. Hepatobiliary: Prominent diffuse  fatty infiltration of the liver with hepatomegaly. No focal lesions. Gallbladder and bile ducts are unremarkable. Pancreas: Unremarkable. No pancreatic ductal dilatation or surrounding inflammatory changes. Spleen: Normal in size without focal abnormality. Adrenals/Urinary Tract: Adrenal glands are unremarkable. Kidneys are normal, without renal calculi, focal lesion, or hydronephrosis. Bladder is decompressed. Stomach/Bowel: Previous colectomy with surgical anastomoses demonstrated in the right lower quadrant and sigmoid region. This likely represents a subtotal colectomy. Stomach and small bowel are mostly decompressed. There is a small periumbilical hernia containing small bowel but without proximal obstruction. No inflammatory infiltration or bowel wall thickening is appreciated. Vascular/Lymphatic: Aortic atherosclerosis. No enlarged abdominal or pelvic lymph nodes. Reproductive: Prostate is unremarkable. Other: No free air or  free fluid in the abdomen. Musculoskeletal: Degenerative changes in the spine and hips. No destructive bone lesions. IMPRESSION: 1. Previous subtotal colectomy. 2. Small periumbilical hernia containing small bowel but without proximal obstruction. 3. Prominent diffuse fatty infiltration of the liver with hepatomegaly. 4. Aortic atherosclerosis.  Coronary artery calcifications. 5. Small esophageal hiatal hernia. Electronically Signed   By: Burman Nieves M.D.   On: 10/24/2017 21:31      Impression / Plan:   Larry Andrade. is a 57 y.o. y/o male with who had nausea and vomiting for a few days with abdominal pain.  The patient has a history of alcohol abuse and pancreatitis. The patient has chronic diarrhea after having part of his colon removed.  The patient had a drop in his hemoglobin and some hematemesis after vomiting for a few days.  The patient will be set up for an EGD for tomorrow. I have discussed risks & benefits which include, but are not limited to, bleeding, infection,  perforation & drug reaction.  The patient agrees with this plan & written consent will be obtained.     Thank you for involving me in the care of this patient.      LOS: 0 days   Midge Minium, MD  10/25/2017, 4:57 PM   Note: This dictation was prepared with Dragon dictation along with smaller phrase technology. Any transcriptional errors that result from this process are unintentional.

## 2017-10-25 NOTE — Progress Notes (Signed)
CRITICAL VALUE ALERT  Critical Value:  Lactic acid - 2.0  Date & Time Notied:  0710 10/25/2017  Provider Notified: MD notified   Orders Received/Actions taken: no new orders given at this time.   Birdella Sippel [employer]

## 2017-10-25 NOTE — Progress Notes (Signed)
CRITICAL VALUE ALERT  Critical Value:  Mag 0.9  Date & Time Notied:  0835 10/25/2017  Provider Notified: MD notified   Orders Received/Actions taken: new orders placed by MD.  Sharyon Medicus

## 2017-10-25 NOTE — Progress Notes (Signed)
[facility] [place] [facility]   PATIENT NAME: Ethen Bannan    MR#:  [id number]  DATE OF BIRTH:  10-Feb-1961  SUBJECTIVE:  CHIEF COMPLAINT:   Chief Complaint  Patient presents with  . Emesis   No complaints except chronic diarrhea.  But no diarrhea today. REVIEW OF SYSTEMS:  Review of Systems  Constitutional: Negative for chills, fever and malaise/fatigue.  HENT: Negative for sore throat.   Eyes: Negative for blurred vision and double vision.  Respiratory: Negative for cough, hemoptysis, shortness of breath, wheezing and stridor.   Cardiovascular: Negative for chest pain, palpitations, orthopnea and leg swelling.  Gastrointestinal: Positive for diarrhea. Negative for abdominal pain, blood in stool, melena, nausea and vomiting.  Genitourinary: Negative for dysuria, flank pain and hematuria.  Musculoskeletal: Negative for back pain and joint pain.  Skin: Negative for rash.  Neurological: Negative for dizziness, sensory change, focal weakness, seizures, loss of consciousness, weakness and headaches.  Endo/Heme/Allergies: Negative for polydipsia.  Psychiatric/Behavioral: Negative for depression. The patient is not nervous/anxious.     DRUG ALLERGIES:   Allergies  Allergen Reactions  . Penicillins Rash    Has patient had a PCN reaction causing immediate rash, facial/tongue/throat swelling, SOB or lightheadedness with hypotension: No Has patient had a PCN reaction causing severe rash involving mucus membranes or skin necrosis: No Has patient had a PCN reaction that required hospitalization: No Has patient had a PCN reaction occurring within the last 10 years: No If all of the above answers are "NO", then may proceed with Cephalosporin use.    VITALS:  Blood pressure (!) 118/92, pulse (!) 102, temperature 98.6 F (37 C), temperature source Oral, resp. rate 20, height  (1.854 m), weight 189 lb 6 oz (85.9 kg), SpO2 99 %. PHYSICAL EXAMINATION:    Physical Exam  Constitutional: He is oriented to person, place, and time.  HENT:  Head: Normocephalic.  Mouth/Throat: Oropharynx is clear and moist.  Eyes: Pupils are equal, round, and reactive to light. Conjunctivae and EOM are normal. No scleral icterus.  Neck: Normal range of motion. Neck supple. No JVD present. No tracheal deviation present.  Cardiovascular: Normal rate, regular rhythm and normal heart sounds. Exam reveals no gallop.  No murmur heard. Pulmonary/Chest: Effort normal and breath sounds normal. No respiratory distress. He has no wheezes. He has no rales.  Abdominal: Soft. Bowel sounds are normal. He exhibits no distension. There is no tenderness. There is no rebound.  Musculoskeletal: Normal range of motion. He exhibits no edema or tenderness.  Neurological: He is alert and oriented to person, place, and time. No cranial nerve deficit.  Skin: No rash noted. No erythema.  Psychiatric: He has a normal mood and affect.   LABORATORY PANEL:  Male CBC Recent Labs  Lab 10/25/17 0500  WBC 11.0*  HGB 8.9*  HCT 25.5*  PLT 261   ------------------------------------------------------------------------------------------------------------------ Chemistries  Recent Labs  Lab 10/24/17 2018 10/25/17 0500  NA 136 136  K 2.9* 3.0*  CL 88* 96*  CO2 25 30  GLUCOSE 102* 99  BUN 12 11  CREATININE 1.20 0.88  CALCIUM 8.4* 7.0*  MG  --  0.9*  AST 73*  --   ALT 22  --   ALKPHOS 285*  --   BILITOT 1.1  --    RADIOLOGY:  Ct Abdomen Pelvis W Contrast  Result Date: 10/24/2017 CLINICAL DATA:  Vomiting today.  Lightheaded.  Alcohol use. EXAM: CT ABDOMEN AND PELVIS WITH CONTRAST TECHNIQUE: Multidetector  CT imaging of the abdomen and pelvis was performed using the standard protocol following bolus administration of intravenous contrast. CONTRAST:  75mL ISOVUE-370 IOPAMIDOL (ISOVUE-370) INJECTION 76% COMPARISON:  None. FINDINGS: Lower chest: The lung bases are clear. Coronary artery  calcifications. Small esophageal hiatal hernia. Old left rib fractures. Hepatobiliary: Prominent diffuse fatty infiltration of the liver with hepatomegaly. No focal lesions. Gallbladder and bile ducts are unremarkable. Pancreas: Unremarkable. No pancreatic ductal dilatation or surrounding inflammatory changes. Spleen: Normal in size without focal abnormality. Adrenals/Urinary Tract: Adrenal glands are unremarkable. Kidneys are normal, without renal calculi, focal lesion, or hydronephrosis. Bladder is decompressed. Stomach/Bowel: Previous colectomy with surgical anastomoses demonstrated in the right lower quadrant and sigmoid region. This likely represents a subtotal colectomy. Stomach and small bowel are mostly decompressed. There is a small periumbilical hernia containing small bowel but without proximal obstruction. No inflammatory infiltration or bowel wall thickening is appreciated. Vascular/Lymphatic: Aortic atherosclerosis. No enlarged abdominal or pelvic lymph nodes. Reproductive: Prostate is unremarkable. Other: No free air or free fluid in the abdomen. Musculoskeletal: Degenerative changes in the spine and hips. No destructive bone lesions. IMPRESSION: 1. Previous subtotal colectomy. 2. Small periumbilical hernia containing small bowel but without proximal obstruction. 3. Prominent diffuse fatty infiltration of the liver with hepatomegaly. 4. Aortic atherosclerosis.  Coronary artery calcifications. 5. Small esophageal hiatal hernia. Electronically Signed   By: Burman Nieves M.D.   On: 10/24/2017 21:31   ASSESSMENT AND PLAN:   1.  Acute lactic acidosis.  Lactic acid level was initially at 9.8.  This could be related to alcohol abuse.    Improved.  Follow-up blood cultures  2.  Acute epigastric pain, could be related to acute gastritis, secondary to alcohol abuse.  Continue PPI tx.   3.  Acute alcohol intoxication.   Continue  thiamine, vitamins and potassium. on CIWA protocol.  Alcohol cessation  was discussed.  4.  Hypertension, stable, continue home medications. 5.  Tobacco abuse.  Smoking cessation was counseled for 4 minutes, nicotine patch.  6.  Hypokalemia, potassium level is still low at 3.0 after supplement. Given more potassium in the follow-up level.  Hypomagnesemia.  Magnesium is 0.9.  Give IV magnesium in the follow-up level. Leukocytosis.  Unclear etiology, possible due to reaction.  Improved.  Follow-up blood culture.  All the records are reviewed and case discussed with Care Management/Social Worker. Management plans discussed with the patient, family and they are in agreement.  CODE STATUS: Full Code  TOTAL TIME TAKING CARE OF THIS PATIENT: 33 minutes.   More than 50% of the time was spent in counseling/coordination of care: YES  POSSIBLE D/C IN 1-2 DAYS, DEPENDING ON CLINICAL CONDITION.   Shaune Pollack M.D on 10/25/2017 at 1:10 PM  Between 7am to 6pm - Pager - [phone]  After 6pm go to [email or web address] - Therapist, [facility] Hospitalists

## 2017-10-25 NOTE — H&P (Addendum)
[facility] -  at [facility]   PATIENT NAME: Larry Andrade    MR#:  [id number]  DATE OF BIRTH:  [birth year]  DATE OF ADMISSION:  10/24/2017  PRIMARY CARE PHYSICIAN: Pricilla Holm, MD   REQUESTING/REFERRING PHYSICIAN:   CHIEF COMPLAINT:   Chief Complaint  Patient presents with  . Emesis    HISTORY OF PRESENT ILLNESS: Larry Andrade  is a 57 y.o. male with a known history of CHF, cirrhosis, GERD, hypertension, pancreatitis, tobacco and alcohol abuse.  He also has previous surgical history of partial colectomy, due to a mass.  Patient admits to drinking alcohol every day and most recently he drank just 2 to 3 hours before coming to emergency room. Patient presented to emergency room for epigastric pain, nausea and vomiting, going on for the past 2 days, gradually getting worse.  The epigastric pain is described as severe cramping, without radiation; it gets worse after eating and drinking alcohol.  No fever or chills at home.  No diarrhea, no bleeding. Pain improved with morphine IV in the emergency room. Blood test done emergency room were remarkable for elevated WBC at 15.4 and elevated lactic acid level at 9.8.  Potassium level is low at 2.9.  Alcohol level is elevated, at 57. The CAT scan of the abdomen, viewed by myself, shows prominent diffuse fatty infiltration of the liver with hepatomegaly. Patient is admitted for further evaluation and treatment.  PAST MEDICAL HISTORY:   Past Medical History:  Diagnosis Date  . CHF (congestive heart failure) (HCC)   . Cirrhosis (HCC)   . GERD (gastroesophageal reflux disease)   . Gout   . Hypertension   . Pancreatitis     PAST SURGICAL HISTORY:  Past Surgical History:  Procedure Laterality Date  . CARDIAC CATHETERIZATION    . COLON SURGERY      SOCIAL HISTORY:  Social History   Tobacco Use  . Smoking status: Current Every Day Smoker  . Smokeless tobacco: Never Used  Substance Use Topics  . Alcohol  use: No    Comment: former    FAMILY HISTORY:  Family History  Problem Relation Age of Onset  . CVA Mother   . CAD Mother   . CAD Father     DRUG ALLERGIES:  Allergies  Allergen Reactions  . Penicillins Rash    Has patient had a PCN reaction causing immediate rash, facial/tongue/throat swelling, SOB or lightheadedness with hypotension: No Has patient had a PCN reaction causing severe rash involving mucus membranes or skin necrosis: No Has patient had a PCN reaction that required hospitalization: No Has patient had a PCN reaction occurring within the last 10 years: No If all of the above answers are "NO", then may proceed with Cephalosporin use.     REVIEW OF SYSTEMS:   CONSTITUTIONAL: No fever, but patient complains of fatigue and generalized weakness.  EYES: No blurred or double vision.  EARS, NOSE, AND THROAT: No tinnitus or ear pain.  RESPIRATORY: No cough, shortness of breath, wheezing or hemoptysis.  CARDIOVASCULAR: No chest pain, orthopnea, edema.  GASTROINTESTINAL: Positive for nausea and vomiting; no diarrhea.  Positive for epigastric pain. GENITOURINARY: No dysuria, hematuria.  ENDOCRINE: No polyuria, nocturia,  HEMATOLOGY: No bleeding. SKIN: No rash or lesion. MUSCULOSKELETAL: No joint pain at this time.   NEUROLOGIC: No focal weakness.  PSYCHIATRY: No anxiety or depression.   MEDICATIONS AT HOME:  Prior to Admission medications   Medication Sig Start Date End Date Taking? Authorizing Provider  allopurinol (ZYLOPRIM) 100 MG tablet Take 1 tablet (100 mg total) by mouth daily. 02/25/17  Yes Enid Baas, MD  amLODipine (NORVASC) 10 MG tablet Take 1 tablet (10 mg total) by mouth daily. 02/25/17  Yes Enid Baas, MD  colchicine 0.6 MG tablet Take 0.6-1.2 mg by mouth daily.   Yes [provider]  omeprazole (PRILOSEC) 20 MG capsule Take 20 mg by mouth daily.   Yes [provider]  oxyCODONE (OXY IR/ROXICODONE) 5 MG immediate release  tablet Take 5 mg by mouth every 6 (six) hours as needed for severe pain.   Yes [provider]  oxyCODONE (OXY IR/ROXICODONE) 5 MG immediate release tablet Take 1 tablet (5 mg total) by mouth every 6 (six) hours as needed for moderate pain or severe pain. Patient not taking: Reported on 10/24/2017 02/24/17   Enid Baas, MD  predniSONE (DELTASONE) 50 MG tablet Take 1 tablet (50 mg total) by mouth daily with breakfast. X 10 days Patient not taking: Reported on 10/24/2017 02/25/17   Enid Baas, MD      PHYSICAL EXAMINATION:   VITAL SIGNS: Blood pressure 126/84, pulse (!) 116, temperature 97.7 F (36.5 C), temperature source Oral, resp. rate (!) 30, height  (1.854 m), weight 90.7 kg (200 lb), SpO2 92 %.  GENERAL:  57 y.o.-year-old patient lying in the bed with mild distress, secondary to epigastric pain.  This is much improved, now, status post morphine IV. EYES: Pupils equal, round, reactive to light and accommodation. No scleral icterus. Extraocular muscles intact.  HEENT: Head atraumatic, normocephalic. Oropharynx and nasopharynx clear.  NECK:  Supple, no jugular venous distention. No thyroid enlargement, no tenderness.  LUNGS: Reduced breath sounds bilaterally, no wheezing, rales,rhonchi or crepitation. No use of accessory muscles of respiration.  CARDIOVASCULAR: S1, S2 normal. No S3/S4.  ABDOMEN: Mildly tender in the epigastric area with deep palpation.  Otherwise, the abdomen is soft, nondistended. Bowel sounds present. No organomegaly or mass.  EXTREMITIES: No pedal edema, cyanosis, or clubbing.  NEUROLOGIC: No focal weakness.  PSYCHIATRIC: The patient is alert and oriented x 3.  SKIN: No obvious rash, lesion, or ulcer.   LABORATORY PANEL:   CBC Recent Labs  Lab 10/24/17 2018  WBC 15.4*  HGB 11.0*  HCT 31.0*  PLT 361  MCV 118.1*  MCH 42.0*  MCHC 35.5  RDW 16.1*  LYMPHSABS 2.2  MONOABS 1.0  EOSABS 0.2  BASOSABS 0.2*    ------------------------------------------------------------------------------------------------------------------  Chemistries  Recent Labs  Lab 10/24/17 2018  NA 136  K 2.9*  CL 88*  CO2 25  GLUCOSE 102*  BUN 12  CREATININE 1.20  CALCIUM 8.4*  AST 73*  ALT 22  ALKPHOS 285*  BILITOT 1.1   ------------------------------------------------------------------------------------------------------------------ estimated creatinine clearance is 79.5 mL/min (by C-G formula based on SCr of 1.2 mg/dL). ------------------------------------------------------------------------------------------------------------------ No results for input(s): TSH, T4TOTAL, T3FREE, THYROIDAB in the last 72 hours.  Invalid input(s): FREET3   Coagulation profile No results for input(s): INR, PROTIME in the last 168 hours. ------------------------------------------------------------------------------------------------------------------- No results for input(s): DDIMER in the last 72 hours. -------------------------------------------------------------------------------------------------------------------  Cardiac Enzymes No results for input(s): CKMB, TROPONINI, MYOGLOBIN in the last 168 hours.  Invalid input(s): CK ------------------------------------------------------------------------------------------------------------------ Invalid input(s): POCBNP  ---------------------------------------------------------------------------------------------------------------  Urinalysis    Component Value Date/Time   COLORURINE AMBER (A) 10/24/2017 2019   APPEARANCEUR CLEAR (A) 10/24/2017 2019   LABSPEC 1.027 10/24/2017 2019   PHURINE 5.0 10/24/2017 2019   GLUCOSEU NEGATIVE 10/24/2017 2019   HGBUR SMALL (A) 10/24/2017 2019  BILIRUBINUR SMALL (A) 10/24/2017 2019   KETONESUR 5 (A) 10/24/2017 2019   PROTEINUR 30 (A) 10/24/2017 2019   NITRITE NEGATIVE 10/24/2017 2019   LEUKOCYTESUR NEGATIVE 10/24/2017 2019      RADIOLOGY: Ct Abdomen Pelvis W Contrast  Result Date: 10/24/2017 CLINICAL DATA:  Vomiting today.  Lightheaded.  Alcohol use. EXAM: CT ABDOMEN AND PELVIS WITH CONTRAST TECHNIQUE: Multidetector CT imaging of the abdomen and pelvis was performed using the standard protocol following bolus administration of intravenous contrast. CONTRAST:  75mL ISOVUE-370 IOPAMIDOL (ISOVUE-370) INJECTION 76% COMPARISON:  None. FINDINGS: Lower chest: The lung bases are clear. Coronary artery calcifications. Small esophageal hiatal hernia. Old left rib fractures. Hepatobiliary: Prominent diffuse fatty infiltration of the liver with hepatomegaly. No focal lesions. Gallbladder and bile ducts are unremarkable. Pancreas: Unremarkable. No pancreatic ductal dilatation or surrounding inflammatory changes. Spleen: Normal in size without focal abnormality. Adrenals/Urinary Tract: Adrenal glands are unremarkable. Kidneys are normal, without renal calculi, focal lesion, or hydronephrosis. Bladder is decompressed. Stomach/Bowel: Previous colectomy with surgical anastomoses demonstrated in the right lower quadrant and sigmoid region. This likely represents a subtotal colectomy. Stomach and small bowel are mostly decompressed. There is a small periumbilical hernia containing small bowel but without proximal obstruction. No inflammatory infiltration or bowel wall thickening is appreciated. Vascular/Lymphatic: Aortic atherosclerosis. No enlarged abdominal or pelvic lymph nodes. Reproductive: Prostate is unremarkable. Other: No free air or free fluid in the abdomen. Musculoskeletal: Degenerative changes in the spine and hips. No destructive bone lesions. IMPRESSION: 1. Previous subtotal colectomy. 2. Small periumbilical hernia containing small bowel but without proximal obstruction. 3. Prominent diffuse fatty infiltration of the liver with hepatomegaly. 4. Aortic atherosclerosis.  Coronary artery calcifications. 5. Small esophageal hiatal  hernia. Electronically Signed   By: Burman Nieves M.D.   On: 10/24/2017 21:31    EKG: Orders placed or performed during the hospital encounter of 10/24/17  . ED EKG  . ED EKG  . EKG 12-Lead  . EKG 12-Lead    IMPRESSION AND PLAN:  1.  Acute lactic acidosis.  Lactic acid level was initially at 9.8.  This could be related to alcohol abuse.  We will check blood cultures to rule out sepsis.  No infectious process is obvious at this time.  Continue to follow lactic acid level.  2.  Acute epigastric pain, could be related to acute gastritis, secondary to alcohol abuse.  Continue PPI tx. Alcohol cessation was discussed with patient in detail. 3.  Acute alcohol intoxication.  Will start patient on IV fluids with thiamine, vitamins and potassium.  Will monitor patient for withdrawal symptoms and will place him on CIWA protocol.  Alcohol cessation was discussed with patient in detail. 4.  Hypertension, stable, continue home medications. 5.  Tobacco abuse.  Smoking cessation was discussed with patient in detail.  6.  Hypokalemia, potassium level is low at 2.9.  No EKG changes noted from hypokalemia.  Will replace potassium per protocol.  All the records are reviewed and case discussed with ED provider. Management plans discussed with the patient and he is in agreement.  CODE STATUS: Code Status History    Date Active Date Inactive Code Status Order ID Comments User Context   02/22/2017 1122 02/24/2017 1413 Full Code [id number]  Alford Highland, MD ED       TOTAL TIME TAKING CARE OF THIS PATIENT: 45 minutes.    Cammy Copa M.D on 10/25/2017 at 12:54 AM  Between 7am to 6pm - Pager - [phone]  After 6pm go to  [email or web address] - password [email or web address] [facility]  [facility]  Office  [phone]  CC: Primary care physician; Pricilla Holm, MD

## 2017-10-26 ENCOUNTER — Inpatient Hospital Stay: Payer: Medicare Other | Admitting: Anesthesiology

## 2017-10-26 ENCOUNTER — Encounter
Admission: EM | Disposition: A | Discharge status: Home / Self Care | Payer: Self-pay | Source: Home / Self Care | Attending: Internal Medicine

## 2017-10-26 ENCOUNTER — Encounter: Payer: Self-pay | Admitting: *Deleted

## 2017-10-26 DIAGNOSIS — K2210 Ulcer of esophagus without bleeding: Principal | ICD-10-CM

## 2017-10-26 DIAGNOSIS — K920 Hematemesis: Secondary | ICD-10-CM

## 2017-10-26 DIAGNOSIS — K269 Duodenal ulcer, unspecified as acute or chronic, without hemorrhage or perforation: Secondary | ICD-10-CM

## 2017-10-26 HISTORY — PX: ESOPHAGOGASTRODUODENOSCOPY (EGD) WITH PROPOFOL: SHX5813

## 2017-10-26 LAB — BASIC METABOLIC PANEL
Anion gap: 6 (ref 5–15)
BUN: 8 mg/dL (ref 6–20)
CHLORIDE: 102 mmol/L (ref 101–111)
CO2: 28 mmol/L (ref 22–32)
Calcium: 7.2 mg/dL — ABNORMAL LOW (ref 8.9–10.3)
Creatinine, Ser: 0.82 mg/dL (ref 0.61–1.24)
GFR calc non Af Amer: >60 mL/min (ref >60)
Glucose, Bld: 84 mg/dL (ref 65–99)
Potassium: 3.2 mmol/L — ABNORMAL LOW (ref 3.5–5.1)
SODIUM: 136 mmol/L (ref 135–145)

## 2017-10-26 LAB — MAGNESIUM: MAGNESIUM: 1.6 mg/dL — AB (ref 1.7–2.4)

## 2017-10-26 LAB — HEMOGLOBIN: Hemoglobin: 8.8 g/dL — ABNORMAL LOW (ref 13.0–18.0)

## 2017-10-26 SURGERY — ESOPHAGOGASTRODUODENOSCOPY (EGD) WITH PROPOFOL
Anesthesia: General

## 2017-10-26 MED ORDER — PROPOFOL 500 MG/50ML IV EMUL
INTRAVENOUS | Status: DC | PRN
  Administered 2017-10-26: 150 ug/kg/min via INTRAVENOUS

## 2017-10-26 MED ORDER — PROPOFOL 10 MG/ML IV BOLUS
INTRAVENOUS | Status: DC | PRN
  Administered 2017-10-26: 20 mg via INTRAVENOUS
  Administered 2017-10-26: 80 mg via INTRAVENOUS

## 2017-10-26 MED ORDER — MIDAZOLAM HCL 2 MG/2ML IJ SOLN
INTRAMUSCULAR | Status: DC | PRN
  Administered 2017-10-26: 2 mg via INTRAVENOUS

## 2017-10-26 MED ORDER — LIDOCAINE HCL (PF) 2 % IJ SOLN
INTRAMUSCULAR | Status: AC
  Filled 2017-10-26: qty 10

## 2017-10-26 MED ORDER — PANTOPRAZOLE SODIUM 40 MG PO TBEC: 40.0000 mg | DELAYED_RELEASE_TABLET | Freq: Every day | ORAL | 0 refills | Status: DC

## 2017-10-26 MED ORDER — NICOTINE 21 MG/24HR TD PT24: 21.0000 mg | MEDICATED_PATCH | Freq: Every day | TRANSDERMAL | 0 refills | Status: DC

## 2017-10-26 MED ORDER — MAGNESIUM SULFATE 2 GM/50ML IV SOLN
2.0000 g | Freq: Once | INTRAVENOUS | Status: AC
  Administered 2017-10-26: 2 g via INTRAVENOUS
  Filled 2017-10-26: qty 50

## 2017-10-26 MED ORDER — FOLIC ACID 1 MG PO TABS: 1.0000 mg | ORAL_TABLET | Freq: Every day | ORAL | 0 refills | Status: DC

## 2017-10-26 MED ORDER — ADULT MULTIVITAMIN W/MINERALS CH: 1.0000 | ORAL_TABLET | Freq: Every day | ORAL | 0 refills | Status: DC

## 2017-10-26 MED ORDER — LIDOCAINE HCL (CARDIAC) PF 100 MG/5ML IV SOSY
PREFILLED_SYRINGE | INTRAVENOUS | Status: DC | PRN
  Administered 2017-10-26: 100 mg via INTRAVENOUS

## 2017-10-26 MED ORDER — SODIUM CHLORIDE 0.9 % IV SOLN
200.0000 mg | Freq: Once | INTRAVENOUS | Status: AC
  Administered 2017-10-26: 200 mg via INTRAVENOUS
  Filled 2017-10-26: qty 10

## 2017-10-26 MED ORDER — LIDOCAINE HCL (PF) 1 % IJ SOLN
INTRAMUSCULAR | Status: AC
  Filled 2017-10-26: qty 2

## 2017-10-26 MED ORDER — MIDAZOLAM HCL 2 MG/2ML IJ SOLN
INTRAMUSCULAR | Status: AC
  Filled 2017-10-26: qty 2

## 2017-10-26 MED ORDER — THIAMINE HCL 100 MG PO TABS: 100.0000 mg | ORAL_TABLET | Freq: Every day | ORAL | 0 refills | Status: DC

## 2017-10-26 MED ORDER — POTASSIUM CHLORIDE CRYS ER 20 MEQ PO TBCR
40.0000 meq | EXTENDED_RELEASE_TABLET | Freq: Once | ORAL | Status: AC
  Administered 2017-10-26: 40 meq via ORAL
  Filled 2017-10-26: qty 2

## 2017-10-26 MED ORDER — PROPOFOL 10 MG/ML IV BOLUS
INTRAVENOUS | Status: AC
  Filled 2017-10-26: qty 20

## 2017-10-26 NOTE — Anesthesia Postprocedure Evaluation (Signed)
Anesthesia Post Note  Patient: Larry Andrade.  Procedure(s) Performed: ESOPHAGOGASTRODUODENOSCOPY (EGD) WITH PROPOFOL (N/A )  Patient location during evaluation: Endoscopy Anesthesia Type: General Level of consciousness: awake and alert Pain management: pain level controlled Vital Signs Assessment: post-procedure vital signs reviewed and stable Respiratory status: spontaneous breathing and respiratory function stable Cardiovascular status: stable Anesthetic complications: no     Last Vitals:  Vitals:   10/26/17 0601 10/26/17 0814  BP: (!) 133/92 101/63  Pulse: (!) 108 (!) 104  Resp: 20 (!) 24  Temp: 36.8 C (!) 36.1 C  SpO2: 99% 99%    Last Pain:  Vitals:   10/26/17 0814  TempSrc: Tympanic  PainSc:                  KEPHART,WILLIAM K

## 2017-10-26 NOTE — Progress Notes (Signed)
EGD  1. Esophageal ulcer and duodenal ulcer both seen- non bleeding  2. Check H pylori stool antigen  3. Noalcohol or NSAID's 4. Repeat EGD in 6 weeks 5 Prilosec 40 mg BID  6. Advance diet  7. No further input from GI point of view -    I will sign off.  Please call me if any further GI concerns or questions.  We would like to thank you for the opportunity to participate in the care of Larry Andrade..    Dr [name] MD,MRCP [facility]) Gastroenterology/Hepatology Pager: [phone]

## 2017-10-26 NOTE — Op Note (Signed)
[facility] Patient Name: Larry Andrade Procedure Date: 10/26/2017 7:22 AM MRN: [id number] Account #: [id number] Date of Birth: 06/16/1961 Admit Type: Outpatient Age: 57 Room: [facility] ENDO ROOM 1 Gender: Male Note Status: Finalized Procedure:            Upper GI endoscopy Indications:          Hematemesis Providers:            [name] MD, MD Referring MD:         Dewitt Rota. Cedric Fishman (Referring MD) Medicines:            Monitored Anesthesia Care Complications:        No immediate complications. Procedure:            Pre-Anesthesia Assessment:                       - Prior to the procedure, a History and Physical was                        performed, and patient medications, allergies and                        sensitivities were reviewed. The patient's tolerance of                        previous anesthesia was reviewed.                       - The risks and benefits of the procedure and the                        sedation options and risks were discussed with the                        patient. All questions were answered and informed                        consent was obtained.                       - ASA Grade Assessment: III - A patient with severe                        systemic disease.                       After obtaining informed consent, the endoscope was                        passed under direct vision. Throughout the procedure,                        the patient's blood pressure, pulse, and oxygen                        saturations were monitored continuously. The Endoscope                        was introduced through the mouth, and advanced to the  third part of duodenum. The upper GI endoscopy was                        accomplished with ease. The patient tolerated the                        procedure well. Findings:      One cratered esophageal ulcer with no bleeding and no stigmata of recent       bleeding was  found at the gastroesophageal junction. The lesion was 6 mm       in largest dimension.      A medium-sized hiatal hernia was present.      One non-bleeding superficial duodenal ulcer with no stigmata of bleeding       was found in the duodenal bulb. The lesion was 7 mm in largest dimension.      The cardia and gastric fundus were normal on retroflexion. Impression:           - Non-bleeding esophageal ulcer.                       - Medium-sized hiatal hernia.                       - One non-bleeding duodenal ulcer with no stigmata of                        bleeding.                       - No specimens collected. Recommendation:       - Return patient to hospital ward for ongoing care.                       - Advance diet as tolerated.                       - Continue present medications.                       - 1. check stool H pylori antigen                       2. Discharge on Prilosec 40 mg BID when ready to go home                       3. Stop all alcohol                       4. Differentials for esophageal ulcer is pill                        esophagitis vs reflux esophagitis and ulcer .                       5. Repeat EGD in 6 weeks to ensure healing of the                        esophageal and duodenal ulcer ( duodenal ulcer appeared                        a bit odd with  no inflammation surrounding the area of                        ulcer ) Procedure Code(s):    --- Professional ---                       [phone], Esophagogastroduodenoscopy, flexible, transoral;                        diagnostic, including collection of specimen(s) by                        brushing or washing, when performed (separate procedure) Diagnosis Code(s):    --- Professional ---                       K22.10, Ulcer of esophagus without bleeding                       K44.9, Diaphragmatic hernia without obstruction or                        gangrene                       K26.9, Duodenal ulcer,  unspecified as acute or chronic,                        without hemorrhage or perforation                       K92.0, Hematemesis CPT copyright 2017 [employer]. All rights reserved. The codes documented in this report are preliminary and upon coder review may  be revised to meet current compliance requirements. [name], MD [name] MD, MD 10/26/2017 8:13:16 AM This report has been signed electronically. Number of Addenda: 0 Note Initiated On: 10/26/2017 7:22 AM      [facility]

## 2017-10-26 NOTE — Discharge Summary (Signed)
[employer] [facility] - [facility]   PATIENT NAME: Larry Andrade    MR#:  [id number]  DATE OF BIRTH:  [birth year]  DATE OF ADMISSION:  10/24/2017 ADMITTING PHYSICIAN: Cammy Copa, MD  DATE OF DISCHARGE: 10/26/2017  PRIMARY CARE PHYSICIAN: Pricilla Holm, MD    ADMISSION DIAGNOSIS:  Lactic acidosis [E87.2] Generalized abdominal pain [R10.84]  DISCHARGE DIAGNOSIS:  Active Problems:   Lactic acid acidosis   Generalized abdominal pain   Hematemesis with nausea   Acute posthemorrhagic anemia   SECONDARY DIAGNOSIS:   Past Medical History:  Diagnosis Date  . CHF (congestive heart failure) (HCC)   . Cirrhosis (HCC)   . GERD (gastroesophageal reflux disease)   . Gout   . Hypertension   . Pancreatitis     HOSPITAL COURSE:   1.  Acute lactic acidosis.  This has resolved with IV fluid hydration.  I do not think this is secondary to infection I think this is secondary to alcohol use. 2.  Epigastric pain, alcohol abuse, nausea vomiting.   nausea vomiting and abdominal pain has resolved.  Patient found to have a esophageal ulceration on endoscopy.  Switch omeprazole over to Protonix. 3.  Acute alcohol intoxication.  Patient placed on thiamine multivitamin and folic acid.  Patient needs to stop drinking alcohol. 4.  Hypomagnesemia this was replaced.  Recommend checking a BMP as a follow-up appointment with the magnesium 5.  Acute on chronic blood loss anemia.  IV iron given 6.  Essential hypertension on Norvasc 7.  Hypokalemia replaced during the hospital course  DISCHARGE CONDITIONS:   Satisfactory  CONSULTS OBTAINED:  Treatment Team:  Midge Minium, MD  DRUG ALLERGIES:   Allergies  Allergen Reactions  . Penicillins Rash    Has patient had a PCN reaction causing immediate rash, facial/tongue/throat swelling, SOB or lightheadedness with hypotension: No Has patient had a PCN reaction causing severe rash involving mucus membranes or skin necrosis: No Has  patient had a PCN reaction that required hospitalization: No Has patient had a PCN reaction occurring within the last 10 years: No If all of the above answers are "NO", then may proceed with Cephalosporin use.     DISCHARGE MEDICATIONS:   Allergies as of 10/26/2017      Reactions   Penicillins Rash   Has patient had a PCN reaction causing immediate rash, facial/tongue/throat swelling, SOB or lightheadedness with hypotension: No Has patient had a PCN reaction causing severe rash involving mucus membranes or skin necrosis: No Has patient had a PCN reaction that required hospitalization: No Has patient had a PCN reaction occurring within the last 10 years: No If all of the above answers are "NO", then may proceed with Cephalosporin use.      Medication List    STOP taking these medications   omeprazole 20 MG capsule Commonly known as:  PRILOSEC Replaced by:  pantoprazole 40 MG tablet   predniSONE 50 MG tablet Commonly known as:  DELTASONE     TAKE these medications   allopurinol 100 MG tablet Commonly known as:  ZYLOPRIM Take 1 tablet (100 mg total) by mouth daily.   amLODipine 10 MG tablet Commonly known as:  NORVASC Take 1 tablet (10 mg total) by mouth daily.   colchicine 0.6 MG tablet Take 0.6-1.2 mg by mouth daily.   folic acid 1 MG tablet Commonly known as:  FOLVITE Take 1 tablet (1 mg total) by mouth daily.   multivitamin with minerals Tabs tablet Take 1 tablet by  mouth daily.   nicotine 21 mg/24hr patch Commonly known as:  NICODERM CQ - dosed in mg/24 hours Place 1 patch (21 mg total) onto the skin daily. Generic substitution okay   oxyCODONE 5 MG immediate release tablet Commonly known as:  Oxy IR/ROXICODONE Take 5 mg by mouth every 6 (six) hours as needed for severe pain. What changed:  Another medication with the same name was removed. Continue taking this medication, and follow the directions you see here.   pantoprazole 40 MG tablet Commonly known as:   PROTONIX Take 1 tablet (40 mg total) by mouth daily. Replaces:  omeprazole 20 MG capsule   thiamine 100 MG tablet Take 1 tablet (100 mg total) by mouth daily.        DISCHARGE INSTRUCTIONS:    Follow-up PMD  6 days  If you experience worsening of your admission symptoms, develop shortness of breath, life threatening emergency, suicidal or homicidal thoughts you must seek medical attention immediately by calling 911 or calling your MD immediately  if symptoms less severe.  You Must read complete instructions/literature along with all the possible adverse reactions/side effects for all the Medicines you take and that have been prescribed to you. Take any new Medicines after you have completely understood and accept all the possible adverse reactions/side effects.   Please note  You were cared for by a hospitalist during your hospital stay. If you have any questions about your discharge medications or the care you received while you were in the hospital after you are discharged, you can call the unit and asked to speak with the hospitalist on call if the hospitalist that took care of you is not available. Once you are discharged, your primary care physician will handle any further medical issues. Please note that NO REFILLS for any discharge medications will be authorized once you are discharged, as it is imperative that you return to your primary care physician (or establish a relationship with a primary care physician if you do not have one) for your aftercare needs so that they can reassess your need for medications and monitor your lab values.    Today   CHIEF COMPLAINT:   Chief Complaint  Patient presents with  . Emesis    HISTORY OF PRESENT ILLNESS:  Larry Andrade  is a 57 y.o. male with a known history of alcohol abuse presents with vomiting   VITAL SIGNS:  Blood pressure 106/74, pulse (!) 114, temperature 98.5 F (36.9 C), temperature source Oral, resp. rate 18, height 6'  1" (1.854 m), weight 86.5 kg (190 lb 12.8 oz), SpO2 99 %.   PHYSICAL EXAMINATION:  GENERAL:  57 y.o.-year-old patient lying in the bed with no acute distress.  EYES: Pupils equal, round, reactive to light and accommodation. No scleral icterus. Extraocular muscles intact.  HEENT: Head atraumatic, normocephalic. Oropharynx and nasopharynx clear.  NECK:  Supple, no jugular venous distention. No thyroid enlargement, no tenderness.  LUNGS: Normal breath sounds bilaterally, no wheezing, rales,rhonchi or crepitation. No use of accessory muscles of respiration.  CARDIOVASCULAR: S1, S2 normal. No murmurs, rubs, or gallops.  ABDOMEN: Soft, non-tender, non-distended. Bowel sounds present. No organomegaly or mass.  EXTREMITIES: No pedal edema, cyanosis, or clubbing.  NEUROLOGIC: Cranial nerves II through XII are intact. Muscle strength 5/5 in all extremities. Sensation intact. Gait not checked.  PSYCHIATRIC: The patient is alert and oriented x 3.  SKIN: No obvious rash, lesion, or ulcer.   DATA REVIEW:   CBC Recent Labs  Lab  10/25/17 0500 10/26/17 0422  WBC 11.0*  --   HGB 8.9* 8.8*  HCT 25.5*  --   PLT 261  --     Chemistries  Recent Labs  Lab 10/24/17 2018  10/26/17 0422  NA 136   < > 136  K 2.9*   < > 3.2*  CL 88*   < > 102  CO2 25   < > 28  GLUCOSE 102*   < > 84  BUN 12   < > 8  CREATININE 1.20   < > 0.82  CALCIUM 8.4*   < > 7.2*  MG  --    < > 1.6*  AST 73*  --   --   ALT 22  --   --   ALKPHOS 285*  --   --   BILITOT 1.1  --   --    < > = values in this interval not displayed.    Microbiology Results  Results for orders placed or performed during the hospital encounter of 10/24/17  Culture, blood (routine x 2)     Status: None (Preliminary result)   Collection Time: 10/24/17 10:02 PM  Result Value Ref Range Status   Specimen Description   Final    BLOOD Blood Culture results may not be optimal due to an excessive volume of blood received in culture bottles   Special  Requests   Final    BOTTLES DRAWN AEROBIC AND ANAEROBIC LEFT ANTECUBITAL   Culture   Final    NO GROWTH 2 DAYS Performed at [facility], [address], [place], [place] [address]    Report Status PENDING  Incomplete  Culture, blood (routine x 2)     Status: None (Preliminary result)   Collection Time: 10/24/17 10:02 PM  Result Value Ref Range Status   Specimen Description BLOOD  Final   Special Requests   Final    BOTTLES DRAWN AEROBIC AND ANAEROBIC Blood Culture adequate volume   Culture   Final    NO GROWTH < 24 HOURS Performed at [facility], [address], [place], [place] [address]    Report Status PENDING  Incomplete    RADIOLOGY:  Ct Abdomen Pelvis W Contrast  Result Date: 10/24/2017 CLINICAL DATA:  Vomiting today.  Lightheaded.  Alcohol use. EXAM: CT ABDOMEN AND PELVIS WITH CONTRAST TECHNIQUE: Multidetector CT imaging of the abdomen and pelvis was performed using the standard protocol following bolus administration of intravenous contrast. CONTRAST:  75mL ISOVUE-370 IOPAMIDOL (ISOVUE-370) INJECTION 76% COMPARISON:  None. FINDINGS: Lower chest: The lung bases are clear. Coronary artery calcifications. Small esophageal hiatal hernia. Old left rib fractures. Hepatobiliary: Prominent diffuse fatty infiltration of the liver with hepatomegaly. No focal lesions. Gallbladder and bile ducts are unremarkable. Pancreas: Unremarkable. No pancreatic ductal dilatation or surrounding inflammatory changes. Spleen: Normal in size without focal abnormality. Adrenals/Urinary Tract: Adrenal glands are unremarkable. Kidneys are normal, without renal calculi, focal lesion, or hydronephrosis. Bladder is decompressed. Stomach/Bowel: Previous colectomy with surgical anastomoses demonstrated in the right lower quadrant and sigmoid region. This likely represents a subtotal colectomy. Stomach and small bowel are mostly decompressed. There is a small periumbilical hernia containing small  bowel but without proximal obstruction. No inflammatory infiltration or bowel wall thickening is appreciated. Vascular/Lymphatic: Aortic atherosclerosis. No enlarged abdominal or pelvic lymph nodes. Reproductive: Prostate is unremarkable. Other: No free air or free fluid in the abdomen. Musculoskeletal: Degenerative changes in the spine and hips. No destructive bone lesions. IMPRESSION: 1. Previous  subtotal colectomy. 2. Small periumbilical hernia containing small bowel but without proximal obstruction. 3. Prominent diffuse fatty infiltration of the liver with hepatomegaly. 4. Aortic atherosclerosis.  Coronary artery calcifications. 5. Small esophageal hiatal hernia. Electronically Signed   By: Burman Nieves M.D.   On: 10/24/2017 21:31      Management plans discussed with the patient, family and they are in agreement.  CODE STATUS:     Code Status Orders  (From admission, onward)        Start     Ordered   10/25/17 0147  Full code  Continuous     10/25/17 0146    Code Status History    Date Active Date Inactive Code Status Order ID Comments User Context   02/22/2017 1122 02/24/2017 1413 Full Code [id number]  Alford Highland, MD ED      TOTAL TIME TAKING CARE OF THIS PATIENT: 35 minutes.    Alford Highland M.D on 10/26/2017 at 1:47 PM  Between 7am to 6pm - Pager - [phone]  After 6pm go to [email or web address] - password [email or web address]  [employer] Office  [phone]  CC: Primary care physician; Pricilla Holm, MD

## 2017-10-26 NOTE — Progress Notes (Signed)
Brown Human.  A and O x 4. VSS. Pt tolerating diet well. No complaints of pain or nausea. IV removed intact, prescriptions given. Pt voiced understanding of discharge instructions with no further questions. Pt discharged via wheelchair with axillary.  Orvil Feil MSN, RN-BC  Allergies as of 10/26/2017      Reactions   Penicillins Rash   Has patient had a PCN reaction causing immediate rash, facial/tongue/throat swelling, SOB or lightheadedness with hypotension: No Has patient had a PCN reaction causing severe rash involving mucus membranes or skin necrosis: No Has patient had a PCN reaction that required hospitalization: No Has patient had a PCN reaction occurring within the last 10 years: No If all of the above answers are "NO", then may proceed with Cephalosporin use.      Medication List    STOP taking these medications   omeprazole 20 MG capsule Commonly known as:  PRILOSEC   predniSONE 50 MG tablet Commonly known as:  DELTASONE     TAKE these medications   allopurinol 100 MG tablet Commonly known as:  ZYLOPRIM Take 1 tablet (100 mg total) by mouth daily.   amLODipine 10 MG tablet Commonly known as:  NORVASC Take 1 tablet (10 mg total) by mouth daily.   colchicine 0.6 MG tablet Take 0.6-1.2 mg by mouth daily.   folic acid 1 MG tablet Commonly known as:  FOLVITE Take 1 tablet (1 mg total) by mouth daily.   multivitamin with minerals Tabs tablet Take 1 tablet by mouth daily.   nicotine 21 mg/24hr patch Commonly known as:  NICODERM CQ - dosed in mg/24 hours Place 1 patch (21 mg total) onto the skin daily. Generic substitution okay   oxyCODONE 5 MG immediate release tablet Commonly known as:  Oxy IR/ROXICODONE Take 5 mg by mouth every 6 (six) hours as needed for severe pain. What changed:  Another medication with the same name was removed. Continue taking this medication, and follow the directions you see here.   pantoprazole 40 MG tablet Commonly known as:   PROTONIX Take 1 tablet (40 mg total) by mouth daily.   thiamine 100 MG tablet Take 1 tablet (100 mg total) by mouth daily.       Vitals:   10/26/17 0958 10/26/17 1248  BP: 122/89 106/74  Pulse:  (!) 114  Resp:  18  Temp:  98.5 F (36.9 C)  SpO2:  99%

## 2017-10-26 NOTE — H&P (Signed)
[name], MD [address], [place], [place], [address] [address], [place], [place], [address] Phone: [phone]  Fax: [phone]  Primary Care Physician:  Pricilla Holm, MD   Pre-Procedure History & Physical: HPI:  Larry Voit. is a 57 y.o. male is here for an endoscopy    Past Medical History:  Diagnosis Date  . CHF (congestive heart failure) (HCC)   . Cirrhosis (HCC)   . GERD (gastroesophageal reflux disease)   . Gout   . Hypertension   . Pancreatitis     Past Surgical History:  Procedure Laterality Date  . CARDIAC CATHETERIZATION    . COLON SURGERY      Prior to Admission medications   Medication Sig Start Date End Date Taking? Authorizing Provider  allopurinol (ZYLOPRIM) 100 MG tablet Take 1 tablet (100 mg total) by mouth daily. 02/25/17  Yes Enid Baas, MD  amLODipine (NORVASC) 10 MG tablet Take 1 tablet (10 mg total) by mouth daily. 02/25/17  Yes Enid Baas, MD  colchicine 0.6 MG tablet Take 0.6-1.2 mg by mouth daily.   Yes [provider]  omeprazole (PRILOSEC) 20 MG capsule Take 20 mg by mouth daily.   Yes [provider]  oxyCODONE (OXY IR/ROXICODONE) 5 MG immediate release tablet Take 5 mg by mouth every 6 (six) hours as needed for severe pain.   Yes [provider]  oxyCODONE (OXY IR/ROXICODONE) 5 MG immediate release tablet Take 1 tablet (5 mg total) by mouth every 6 (six) hours as needed for moderate pain or severe pain. Patient not taking: Reported on 10/24/2017 02/24/17   Enid Baas, MD  predniSONE (DELTASONE) 50 MG tablet Take 1 tablet (50 mg total) by mouth daily with breakfast. X 10 days Patient not taking: Reported on 10/24/2017 02/25/17   Enid Baas, MD    Allergies as of 10/24/2017 - Review Complete 10/24/2017  Allergen Reaction Noted  . Penicillins Rash 02/22/2017    Family History  Problem Relation Age of Onset  . CVA Mother   . CAD Mother   . CAD  Father     Social History   Socioeconomic History  . Marital status: Single    Spouse name: Not on file  . Number of children: Not on file  . Years of education: Not on file  . Highest education level: Not on file  Occupational History  . Not on file  Social Needs  . Financial resource strain: Not on file  . Food insecurity:    Worry: Not on file    Inability: Not on file  . Transportation needs:    Medical: Not on file    Non-medical: Not on file  Tobacco Use  . Smoking status: Current Every Day Smoker  . Smokeless tobacco: Never Used  Substance and Sexual Activity  . Alcohol use: No    Comment: former  . Drug use: No  . Sexual activity: Not on file  Lifestyle  . Physical activity:    Days per week: Not on file    Minutes per session: Not on file  . Stress: Not on file  Relationships  . Social connections:    Talks on phone: Not on file    Gets together: Not on file    Attends religious service: Not on file    Active member of club or organization: Not on file    Attends meetings of clubs or organizations: Not on file    Relationship  status: Not on file  . Intimate partner violence:    Fear of current or ex partner: Not on file    Emotionally abused: Not on file    Physically abused: Not on file    Forced sexual activity: Not on file  Other Topics Concern  . Not on file  Social History Narrative  . Not on file    Review of Systems: See HPI, otherwise negative ROS  Physical Exam: BP (!) 133/92 (BP Location: Right Arm)   Pulse (!) 108   Temp 98.2 F (36.8 C) (Oral)   Resp 20   Ht  (1.854 m)   Wt 190 lb 12.8 oz (86.5 kg)   SpO2 99%   BMI 25.17 kg/m  General:   Alert,  pleasant and cooperative in NAD Head:  Normocephalic and atraumatic. Neck:  Supple; no masses or thyromegaly. Lungs:  Clear throughout to auscultation, normal respiratory effort.    Heart:  +S1, +S2, Regular rate and rhythm, No edema. Abdomen:  Soft, nontender and nondistended.  Normal bowel sounds, without guarding, and without rebound.   Neurologic:  Alert and  oriented x4;  grossly normal neurologically.  Impression/Plan: Larry Human. is here for an endoscopy  to be performed for  evaluation of hematemesis    Risks, benefits, limitations, and alternatives regarding endoscopy have been reviewed with the patient.  Questions have been answered.  All parties agreeable.   [name], MD  10/26/2017, 7:53 AM

## 2017-10-26 NOTE — Discharge Instructions (Addendum)
Esophageal ulcer seen on endoscopy.  Bland diet. No aspirin or advil, motrin, alleve or bc powder

## 2017-10-26 NOTE — Transfer of Care (Signed)
Immediate Anesthesia Transfer of Care Note  Patient: Larry Andrade.  Procedure(s) Performed: ESOPHAGOGASTRODUODENOSCOPY (EGD) WITH PROPOFOL (N/A )  Patient Location: PACU  Anesthesia Type:General  Level of Consciousness: awake and alert   Airway & Oxygen Therapy: Patient Spontanous Breathing and Patient connected to nasal cannula oxygen  Post-op Assessment: Report given to RN and Post -op Vital signs reviewed and stable  Post vital signs: Reviewed and stable  Last Vitals:  Vitals Value Taken Time  BP 101/63 10/26/2017  8:14 AM  Temp    Pulse 104 10/26/2017  8:14 AM  Resp 24 10/26/2017  8:14 AM  SpO2 99 % 10/26/2017  8:14 AM    Last Pain:  Vitals:   10/26/17 0630  TempSrc:   PainSc: 10-Worst pain ever      Patients Stated Pain Goal: 1 (10/25/17 1512)  Complications: No apparent anesthesia complications

## 2017-10-26 NOTE — Anesthesia Preprocedure Evaluation (Addendum)
Anesthesia Evaluation  Patient identified by MRN, date of birth, ID band Patient awake    Reviewed: Allergy & Precautions, NPO status , Patient's Chart, lab work & pertinent test results  History of Anesthesia Complications Negative for: history of anesthetic complications  Airway Mallampati: II       Dental  (+) Poor Dentition, Chipped, Loose, Missing   Pulmonary neg sleep apnea, neg COPD, Current Smoker,           Cardiovascular hypertension, Pt. on medications +CHF (hx)       Neuro/Psych neg Seizures    GI/Hepatic GERD  Medicated,(+)     substance abuse  alcohol use,   Endo/Other  neg diabetes  Renal/GU negative Renal ROS     Musculoskeletal   Abdominal   Peds  Hematology  (+) anemia ,   Anesthesia Other Findings   Reproductive/Obstetrics                            Anesthesia Physical Anesthesia Plan  ASA: III and emergent  Anesthesia Plan: General   Post-op Pain Management:    Induction: Intravenous  PONV Risk Score and Plan: 1 and TIVA and Propofol infusion  Airway Management Planned: Nasal Cannula  Additional Equipment:   Intra-op Plan:   Post-operative Plan:   Informed Consent: I have reviewed the patients History and Physical, chart, labs and discussed the procedure including the risks, benefits and alternatives for the proposed anesthesia with the patient or authorized representative who has indicated his/her understanding and acceptance.     Plan Discussed with:   Anesthesia Plan Comments:         Anesthesia Quick Evaluation

## 2017-10-26 NOTE — Anesthesia Procedure Notes (Signed)
Performed by: Jennica Tagliaferri, CRNA Pre-anesthesia Checklist: Patient identified, Emergency Drugs available, Suction available, Patient being monitored and Timeout performed Patient Re-evaluated:Patient Re-evaluated prior to induction Oxygen Delivery Method: Nasal cannula Induction Type: IV induction       

## 2017-10-26 NOTE — Anesthesia Post-op Follow-up Note (Signed)
Anesthesia QCDR form completed.        

## 2017-10-27 ENCOUNTER — Encounter: Payer: Self-pay | Admitting: Gastroenterology

## 2017-10-28 ENCOUNTER — Telehealth: Payer: Self-pay

## 2017-10-28 NOTE — Telephone Encounter (Signed)
EMMI Follow-up: Ms. Friesen called as he had received 3 calls from my number and checking to see if there was some urgent results they needed to know about.  I explained our new call back system post discharge and let her know there was no emergency and we were just checking to see if he had concerns.  Everything was going well and had follow-up appointment made for 5/13. She did say, she could hear the questions and was asked to respond yes or no but the system would not accept the answers.  I apologized and let her know I would report the issue. No other needs.

## 2017-10-29 LAB — CULTURE, BLOOD (ROUTINE X 2): Culture: NO GROWTH

## 2017-10-30 LAB — CULTURE, BLOOD (ROUTINE X 2)
Culture: NO GROWTH
SPECIAL REQUESTS: ADEQUATE

## 2018-08-16 ENCOUNTER — Encounter: Payer: Self-pay | Admitting: Emergency Medicine

## 2018-08-16 ENCOUNTER — Emergency Department: Payer: Medicare Other

## 2018-08-16 ENCOUNTER — Inpatient Hospital Stay
Admission: EM | Admit: 2018-08-16 | Discharge: 2018-08-21 | DRG: 292 | Disposition: A | Discharge status: Home / Self Care | Payer: Medicare Other | Attending: Internal Medicine | Admitting: Internal Medicine

## 2018-08-16 ENCOUNTER — Other Ambulatory Visit: Payer: Self-pay

## 2018-08-16 DIAGNOSIS — D539 Nutritional anemia, unspecified: Secondary | ICD-10-CM | POA: Diagnosis present

## 2018-08-16 DIAGNOSIS — M1A9XX1 Chronic gout, unspecified, with tophus (tophi): Secondary | ICD-10-CM | POA: Diagnosis present

## 2018-08-16 DIAGNOSIS — F1010 Alcohol abuse, uncomplicated: Secondary | ICD-10-CM | POA: Diagnosis present

## 2018-08-16 DIAGNOSIS — Z6826 Body mass index (BMI) 26.0-26.9, adult: Secondary | ICD-10-CM

## 2018-08-16 DIAGNOSIS — E860 Dehydration: Secondary | ICD-10-CM | POA: Diagnosis not present

## 2018-08-16 DIAGNOSIS — R0602 Shortness of breath: Secondary | ICD-10-CM | POA: Diagnosis not present

## 2018-08-16 DIAGNOSIS — I509 Heart failure, unspecified: Secondary | ICD-10-CM

## 2018-08-16 DIAGNOSIS — K7030 Alcoholic cirrhosis of liver without ascites: Secondary | ICD-10-CM | POA: Diagnosis present

## 2018-08-16 DIAGNOSIS — Z880 Allergy status to penicillin: Secondary | ICD-10-CM

## 2018-08-16 DIAGNOSIS — E871 Hypo-osmolality and hyponatremia: Secondary | ICD-10-CM | POA: Diagnosis not present

## 2018-08-16 DIAGNOSIS — I5023 Acute on chronic systolic (congestive) heart failure: Secondary | ICD-10-CM | POA: Diagnosis present

## 2018-08-16 DIAGNOSIS — I110 Hypertensive heart disease with heart failure: Secondary | ICD-10-CM | POA: Diagnosis not present

## 2018-08-16 DIAGNOSIS — Z8249 Family history of ischemic heart disease and other diseases of the circulatory system: Secondary | ICD-10-CM

## 2018-08-16 DIAGNOSIS — K219 Gastro-esophageal reflux disease without esophagitis: Secondary | ICD-10-CM | POA: Diagnosis present

## 2018-08-16 DIAGNOSIS — Z823 Family history of stroke: Secondary | ICD-10-CM

## 2018-08-16 DIAGNOSIS — R188 Other ascites: Secondary | ICD-10-CM

## 2018-08-16 DIAGNOSIS — N179 Acute kidney failure, unspecified: Secondary | ICD-10-CM | POA: Diagnosis not present

## 2018-08-16 DIAGNOSIS — E46 Unspecified protein-calorie malnutrition: Secondary | ICD-10-CM | POA: Diagnosis present

## 2018-08-16 DIAGNOSIS — R3911 Hesitancy of micturition: Secondary | ICD-10-CM | POA: Diagnosis present

## 2018-08-16 DIAGNOSIS — Z9049 Acquired absence of other specified parts of digestive tract: Secondary | ICD-10-CM

## 2018-08-16 DIAGNOSIS — F17200 Nicotine dependence, unspecified, uncomplicated: Secondary | ICD-10-CM | POA: Diagnosis present

## 2018-08-16 LAB — COMPREHENSIVE METABOLIC PANEL
ALT: 15 U/L (ref 0–44)
AST: 54 U/L — ABNORMAL HIGH (ref 15–41)
Albumin: 1.9 g/dL — ABNORMAL LOW (ref 3.5–5.0)
Alkaline Phosphatase: 304 U/L — ABNORMAL HIGH (ref 38–126)
Anion gap: 12 (ref 5–15)
BUN: 22 mg/dL — ABNORMAL HIGH (ref 6–20)
CO2: 31 mmol/L (ref 22–32)
CREATININE: 1.32 mg/dL — AB (ref 0.61–1.24)
Calcium: 7.3 mg/dL — ABNORMAL LOW (ref 8.9–10.3)
Chloride: 90 mmol/L — ABNORMAL LOW (ref 98–111)
GFR calc Af Amer: >60 mL/min (ref >60)
Glucose, Bld: 88 mg/dL (ref 70–99)
Potassium: 2.5 mmol/L — CL (ref 3.5–5.1)
Sodium: 133 mmol/L — ABNORMAL LOW (ref 135–145)
Total Bilirubin: 4.5 mg/dL — ABNORMAL HIGH (ref 0.3–1.2)
Total Protein: 6.1 g/dL — ABNORMAL LOW (ref 6.5–8.1)

## 2018-08-16 LAB — CBC
HCT: 22.7 % — ABNORMAL LOW (ref 39.0–52.0)
HEMOGLOBIN: 7.6 g/dL — AB (ref 13.0–17.0)
MCH: 36.5 pg — AB (ref 26.0–34.0)
MCHC: 33.5 g/dL (ref 30.0–36.0)
MCV: 109.1 fL — AB (ref 80.0–100.0)
NRBC: 0 % (ref 0.0–0.2)
PLATELETS: 338 10*3/uL (ref 150–400)
RBC: 2.08 MIL/uL — AB (ref 4.22–5.81)
RDW: 19.7 % — ABNORMAL HIGH (ref 11.5–15.5)
WBC: 18.8 10*3/uL — ABNORMAL HIGH (ref 4.0–10.5)

## 2018-08-16 LAB — BRAIN NATRIURETIC PEPTIDE: B Natriuretic Peptide: 221 pg/mL — ABNORMAL HIGH (ref 0.0–100.0)

## 2018-08-16 LAB — TROPONIN I
TROPONIN I: 0.03 ng/mL — AB (ref <0.03)
Troponin I: <0.03 ng/mL (ref <0.03)

## 2018-08-16 MED ORDER — FUROSEMIDE 10 MG/ML IJ SOLN
60.0000 mg | Freq: Once | INTRAMUSCULAR | Status: AC
  Administered 2018-08-16: 60 mg via INTRAVENOUS
  Filled 2018-08-16: qty 8

## 2018-08-16 MED ORDER — OXYCODONE HCL 5 MG PO TABS
5.0000 mg | ORAL_TABLET | Freq: Once | ORAL | Status: AC
  Administered 2018-08-16: 5 mg via ORAL
  Filled 2018-08-16: qty 1

## 2018-08-16 MED ORDER — POTASSIUM CHLORIDE CRYS ER 20 MEQ PO TBCR
40.0000 meq | EXTENDED_RELEASE_TABLET | Freq: Once | ORAL | Status: AC
  Administered 2018-08-17: 40 meq via ORAL
  Filled 2018-08-16: qty 2

## 2018-08-16 NOTE — ED Triage Notes (Signed)
Pt in via EMS from [facility] with c/o SOB worse with bending over. Pt also reports gout in left hand for the past 2 weeks. And lower back pain. Pt with edema noted to BLE.

## 2018-08-16 NOTE — ED Provider Notes (Addendum)
[facility] Emergency Department Provider Note  ____________________________________________  Time seen: Approximately 9:46 PM  I have reviewed the triage vital signs and the nursing notes.   HISTORY  Chief Complaint Shortness of Breath; Leg Swelling; and Back Pain    HPI Larry Andrade. is a 58 y.o. male with a history of cirrhosis, HTN, CHF presenting with lower extremity swelling, shortness of breath.  The patient reports that over the last couple of days, he has noted new bilateral lower extremity edema, and shortness of breath when he bends over to tie his shoes.  He is unable to ambulate because of pain associated with the edema in his legs.  He denies any chest pain, pressure or tightness.  He has a chronic cough which is unchanged, without any congestion or rhinorrhea, sore throat, fever.  No n/v/d.  Past Medical History:  Diagnosis Date  . CHF (congestive heart failure) (HCC)   . Cirrhosis (HCC)   . GERD (gastroesophageal reflux disease)   . Gout   . Hypertension   . Pancreatitis     Patient Active Problem List   Diagnosis Date Noted  . Acute on chronic systolic CHF (congestive heart failure) (HCC) 08/17/2018  . Lactic acid acidosis 10/25/2017  . Generalized abdominal pain   . Hematemesis with nausea   . Acute posthemorrhagic anemia   . Gout attack 02/22/2017    Past Surgical History:  Procedure Laterality Date  . CARDIAC CATHETERIZATION    . COLON SURGERY    . ESOPHAGOGASTRODUODENOSCOPY (EGD) WITH PROPOFOL N/A 10/26/2017   Procedure: ESOPHAGOGASTRODUODENOSCOPY (EGD) WITH PROPOFOL;  Surgeon: [name], MD;  Location: [facility] ENDOSCOPY;  Service: Endoscopy;  Laterality: N/A;      Allergies Penicillins  Family History  Problem Relation Age of Onset  . CVA Mother   . CAD Mother   . CAD Father     Social History Social History   Tobacco Use  . Smoking status: Current Every Day Smoker  . Smokeless tobacco: Never Used  Substance  Use Topics  . Alcohol use: Yes    Comment: 1-2 times weekly  . Drug use: No    Review of Systems Constitutional: No fever/chills.  No lightheadedness or syncope. Eyes: No visual changes. ENT: No sore throat. No congestion or rhinorrhea. Cardiovascular: Denies chest pain. Denies palpitations. Respiratory: Positive shortness of breath.  Positive chronic unchanged cough. Gastrointestinal: No abdominal pain.  No nausea, no vomiting.  No diarrhea.  No constipation. Genitourinary: Negative for dysuria. Musculoskeletal: Negative for back pain.  Positive bilateral lower extremity edema. Skin: Negative for rash. Neurological: Negative for headaches. No focal numbness, tingling or weakness.     ____________________________________________   PHYSICAL EXAM:  VITAL SIGNS: ED Triage Vitals  Enc Vitals Group     BP 08/16/18 1642 117/87     Pulse Rate 08/16/18 1642 (!) 123     Resp 08/16/18 1642 20     Temp 08/16/18 1642 97.9 F (36.6 C)     Temp Source 08/16/18 1642 Oral     SpO2 08/16/18 1642 98 %     Weight 08/16/18 1637 200 lb (90.7 kg)     Height 08/16/18 1637 6\' 1"  (1.854 m)     Head Circumference --      Peak Flow --      Pain Score 08/16/18 1636 10     Pain Loc --      Pain Edu? --      Excl. in GC? --  Constitutional: Alert and oriented. Answers questions appropriately.  Chronically ill-appearing. Eyes: Conjunctivae are normal.  EOMI. has a mild scleral icterus. Head: Atraumatic. Nose: No congestion/rhinnorhea. Mouth/Throat: Mucous membranes are mildly dry.  Neck: No stridor.  Supple.  No JVD.  No meningismus. Cardiovascular: Normal rate, regular rhythm. No murmurs, rubs or gallops.  Respiratory: Normal respiratory effort.  No accessory muscle use or retractions. Lungs CTAB.  Rales in the bases bilaterally. Gastrointestinal: Soft, nontender and mildly distended with edema in the abdominal wall..  No guarding or rebound.  No peritoneal signs. Musculoskeletal: Positive  bilateral symmetric pitting LE edema. No ttp in the calves or palpable cords.  Negative Homan's sign. Neurologic:  A&Ox3.  Speech is clear.  Face and smile are symmetric.  EOMI.  Moves all extremities well. Skin:  Skin is warm, dry and intact. No rash noted. Psychiatric: Mood and affect are normal.   ____________________________________________   LABS (all labs ordered are listed, but only abnormal results are displayed)  Labs Reviewed  CBC - Abnormal; Notable for the following components:      Result Value   WBC 18.8 (*)    RBC 2.08 (*)    Hemoglobin 7.6 (*)    HCT 22.7 (*)    MCV 109.1 (*)    MCH 36.5 (*)    RDW 19.7 (*)    All other components within normal limits  TROPONIN I - Abnormal; Notable for the following components:   Troponin I 0.03 (*)    All other components within normal limits  BRAIN NATRIURETIC PEPTIDE - Abnormal; Notable for the following components:   B Natriuretic Peptide 221.0 (*)    All other components within normal limits  COMPREHENSIVE METABOLIC PANEL - Abnormal; Notable for the following components:   Sodium 133 (*)    Potassium 2.5 (*)    Chloride 90 (*)    BUN 22 (*)    Creatinine, Ser 1.32 (*)    Calcium 7.3 (*)    Total Protein 6.1 (*)    Albumin 1.9 (*)    AST 54 (*)    Alkaline Phosphatase 304 (*)    Total Bilirubin 4.5 (*)    All other components within normal limits  URINALYSIS, COMPLETE (UACMP) WITH MICROSCOPIC - Abnormal; Notable for the following components:   Color, Urine AMBER (*)    APPearance CLEAR (*)    All other components within normal limits  TSH - Abnormal; Notable for the following components:   TSH 8.532 (*)    All other components within normal limits  MAGNESIUM - Abnormal; Notable for the following components:   Magnesium 0.9 (*)    All other components within normal limits  VITAMIN B12 - Abnormal; Notable for the following components:   Vitamin B-12 1,096 (*)    All other components within normal limits  FOLATE  - Abnormal; Notable for the following components:   Folate 5.1 (*)    All other components within normal limits  BILIRUBIN, DIRECT - Abnormal; Notable for the following components:   Bilirubin, Direct 2.5 (*)    All other components within normal limits  COMPREHENSIVE METABOLIC PANEL - Abnormal; Notable for the following components:   Sodium 132 (*)    Potassium 2.8 (*)    Chloride 89 (*)    CO2 34 (*)    Glucose, Bld 104 (*)    BUN 21 (*)    Creatinine, Ser 1.32 (*)    Calcium 6.9 (*)    Total Protein 5.6 (*)  Albumin 1.8 (*)    AST 65 (*)    Alkaline Phosphatase 302 (*)    Total Bilirubin 4.3 (*)    All other components within normal limits  CBC - Abnormal; Notable for the following components:   RBC 1.73 (*)    Hemoglobin 6.4 (*)    HCT 18.9 (*)    MCV 109.2 (*)    MCH 37.0 (*)    RDW 19.8 (*)    All other components within normal limits  TROPONIN I  CBC  COMPREHENSIVE METABOLIC PANEL  MAGNESIUM  PHOSPHORUS   ____________________________________________  EKG  ED ECG REPORT I, Anne-Caroline Sharma CovertNorman, the attending physician, personally viewed and interpreted this ECG.   Date: 08/16/2018  EKG Time: 1645  Rate: 122  Rhythm: sinus tachycardia; pVC  Axis: leftward  Intervals:prolonged QTc; nonspecific interventricular conduction delay.  ST&T Change: No STEMI  EKG is compared to 10/24/2017, and is grossly unchanged in morphology.  ____________________________________________  RADIOLOGY  No results found.  ____________________________________________   PROCEDURES  Procedure(s) performed: None  Procedures  Critical Care performed: Yes ____________________________________________   INITIAL IMPRESSION / ASSESSMENT AND PLAN / ED COURSE  Pertinent labs & imaging results that were available during my care of the patient were reviewed by me and considered in my medical decision making (see chart for details).  58 y.o. male with a history of CHF and  cirrhosis presenting with bilateral lower extremity edema, and shortness of breath.  Overall, the patient is tachycardic and has evidence of fluid overload both in his lungs and lower extremities.  His chest x-ray shows mild cardiomegaly and does not show edema, but I wonder if there is a radiographic lag.  A BNP is pending.  Today the patient has a minimally elevated troponin at 0.03 and a second troponin is pending.  He does have a white blood cell count of 18 and chronic anemia with blood counts slightly lower than his baseline at 7.6 compared to 8.9 at his last visit.  Does have an elevation in his white blood cell count, and may have been on steroids when he was treated for gout, but we also look for signs of infection.  His cough is unchanged and there is no evidence of pneumonia.  A UA is pending.  I have ordered Lasix for the patient's lower extremity edema a BMP, BNP are pending.  Plan reevaluation for final disposition.  CRITICAL CARE Performed by: Rockne MenghiniAnne-Caroline Lorette Peterkin   Total critical care time: 35 minutes  Critical care time was exclusive of separately billable procedures and treating other patients.  Critical care was necessary to treat or prevent imminent or life-threatening deterioration.  Critical care was time spent personally by me on the following activities: development of treatment plan with patient and/or surrogate as well as nursing, discussions with consultants, evaluation of patient's response to treatment, examination of patient, obtaining history from patient or surrogate, ordering and performing treatments and interventions, ordering and review of laboratory studies, ordering and review of radiographic studies, pulse oximetry and re-evaluation of patient's condition.   ____________________________________________  FINAL CLINICAL IMPRESSION(S) / ED DIAGNOSES  Final diagnoses:  Acute on chronic congestive heart failure, unspecified heart failure type [facility](HCC)          NEW MEDICATIONS STARTED DURING THIS VISIT:  Current Discharge Medication List        Rockne MenghiniNorman, Anne-Caroline, MD 08/17/18 2219    Rockne MenghiniNorman, Anne-Caroline, MD 08/27/18 2014

## 2018-08-17 ENCOUNTER — Other Ambulatory Visit: Payer: Self-pay

## 2018-08-17 ENCOUNTER — Inpatient Hospital Stay
Admit: 2018-08-17 | Discharge: 2018-08-17 | Disposition: A | Discharge status: Home / Self Care | Payer: Medicare Other | Attending: Internal Medicine | Admitting: Internal Medicine

## 2018-08-17 DIAGNOSIS — Z6826 Body mass index (BMI) 26.0-26.9, adult: Secondary | ICD-10-CM | POA: Diagnosis not present

## 2018-08-17 DIAGNOSIS — E46 Unspecified protein-calorie malnutrition: Secondary | ICD-10-CM | POA: Diagnosis present

## 2018-08-17 DIAGNOSIS — R3911 Hesitancy of micturition: Secondary | ICD-10-CM | POA: Diagnosis present

## 2018-08-17 DIAGNOSIS — D539 Nutritional anemia, unspecified: Secondary | ICD-10-CM | POA: Diagnosis present

## 2018-08-17 DIAGNOSIS — Z823 Family history of stroke: Secondary | ICD-10-CM | POA: Diagnosis not present

## 2018-08-17 DIAGNOSIS — N179 Acute kidney failure, unspecified: Secondary | ICD-10-CM | POA: Diagnosis not present

## 2018-08-17 DIAGNOSIS — E860 Dehydration: Secondary | ICD-10-CM | POA: Diagnosis not present

## 2018-08-17 DIAGNOSIS — F17200 Nicotine dependence, unspecified, uncomplicated: Secondary | ICD-10-CM | POA: Diagnosis present

## 2018-08-17 DIAGNOSIS — F1010 Alcohol abuse, uncomplicated: Secondary | ICD-10-CM | POA: Diagnosis present

## 2018-08-17 DIAGNOSIS — R0602 Shortness of breath: Secondary | ICD-10-CM | POA: Diagnosis present

## 2018-08-17 DIAGNOSIS — Z8249 Family history of ischemic heart disease and other diseases of the circulatory system: Secondary | ICD-10-CM | POA: Diagnosis not present

## 2018-08-17 DIAGNOSIS — I5023 Acute on chronic systolic (congestive) heart failure: Secondary | ICD-10-CM | POA: Diagnosis present

## 2018-08-17 DIAGNOSIS — K7030 Alcoholic cirrhosis of liver without ascites: Secondary | ICD-10-CM | POA: Diagnosis present

## 2018-08-17 DIAGNOSIS — M1A9XX1 Chronic gout, unspecified, with tophus (tophi): Secondary | ICD-10-CM | POA: Diagnosis present

## 2018-08-17 DIAGNOSIS — I110 Hypertensive heart disease with heart failure: Secondary | ICD-10-CM | POA: Diagnosis present

## 2018-08-17 DIAGNOSIS — K219 Gastro-esophageal reflux disease without esophagitis: Secondary | ICD-10-CM | POA: Diagnosis present

## 2018-08-17 DIAGNOSIS — E538 Deficiency of other specified B group vitamins: Secondary | ICD-10-CM | POA: Diagnosis not present

## 2018-08-17 DIAGNOSIS — I509 Heart failure, unspecified: Secondary | ICD-10-CM | POA: Diagnosis not present

## 2018-08-17 DIAGNOSIS — Z880 Allergy status to penicillin: Secondary | ICD-10-CM | POA: Diagnosis not present

## 2018-08-17 DIAGNOSIS — E871 Hypo-osmolality and hyponatremia: Secondary | ICD-10-CM | POA: Diagnosis not present

## 2018-08-17 DIAGNOSIS — Z9049 Acquired absence of other specified parts of digestive tract: Secondary | ICD-10-CM | POA: Diagnosis not present

## 2018-08-17 LAB — URINALYSIS, COMPLETE (UACMP) WITH MICROSCOPIC
Bacteria, UA: NONE SEEN
Bilirubin Urine: NEGATIVE
Glucose, UA: NEGATIVE mg/dL
Hgb urine dipstick: NEGATIVE
Ketones, ur: NEGATIVE mg/dL
LEUKOCYTE UA: NEGATIVE
Nitrite: NEGATIVE
PH: 6 (ref 5.0–8.0)
Protein, ur: NEGATIVE mg/dL
Specific Gravity, Urine: 1.006 (ref 1.005–1.030)

## 2018-08-17 LAB — COMPREHENSIVE METABOLIC PANEL
ALT: 16 U/L (ref 0–44)
AST: 65 U/L — AB (ref 15–41)
Albumin: 1.8 g/dL — ABNORMAL LOW (ref 3.5–5.0)
Alkaline Phosphatase: 302 U/L — ABNORMAL HIGH (ref 38–126)
Anion gap: 9 (ref 5–15)
BUN: 21 mg/dL — AB (ref 6–20)
CO2: 34 mmol/L — ABNORMAL HIGH (ref 22–32)
Calcium: 6.9 mg/dL — ABNORMAL LOW (ref 8.9–10.3)
Chloride: 89 mmol/L — ABNORMAL LOW (ref 98–111)
Creatinine, Ser: 1.32 mg/dL — ABNORMAL HIGH (ref 0.61–1.24)
GFR calc Af Amer: >60 mL/min (ref >60)
GFR calc non Af Amer: >60 mL/min (ref >60)
Glucose, Bld: 104 mg/dL — ABNORMAL HIGH (ref 70–99)
Potassium: 2.8 mmol/L — ABNORMAL LOW (ref 3.5–5.1)
Sodium: 132 mmol/L — ABNORMAL LOW (ref 135–145)
Total Bilirubin: 4.3 mg/dL — ABNORMAL HIGH (ref 0.3–1.2)
Total Protein: 5.6 g/dL — ABNORMAL LOW (ref 6.5–8.1)

## 2018-08-17 LAB — CBC
HEMATOCRIT: 18.9 % — AB (ref 39.0–52.0)
Hemoglobin: 6.4 g/dL — ABNORMAL LOW (ref 13.0–17.0)
MCH: 37 pg — ABNORMAL HIGH (ref 26.0–34.0)
MCHC: 33.9 g/dL (ref 30.0–36.0)
MCV: 109.2 fL — ABNORMAL HIGH (ref 80.0–100.0)
Platelets: 223 10*3/uL (ref 150–400)
RBC: 1.73 MIL/uL — ABNORMAL LOW (ref 4.22–5.81)
RDW: 19.8 % — ABNORMAL HIGH (ref 11.5–15.5)
WBC: 10.5 10*3/uL (ref 4.0–10.5)
nRBC: 0 % (ref 0.0–0.2)

## 2018-08-17 LAB — VITAMIN B12: Vitamin B-12: 1096 pg/mL — ABNORMAL HIGH (ref 180–914)

## 2018-08-17 LAB — MAGNESIUM: Magnesium: 0.9 mg/dL — CL (ref 1.7–2.4)

## 2018-08-17 LAB — BILIRUBIN, DIRECT: Bilirubin, Direct: 2.5 mg/dL — ABNORMAL HIGH (ref 0.0–0.2)

## 2018-08-17 LAB — FOLATE: Folate: 5.1 ng/mL — ABNORMAL LOW (ref >5.9)

## 2018-08-17 LAB — TSH: TSH: 8.532 u[IU]/mL — ABNORMAL HIGH (ref 0.350–4.500)

## 2018-08-17 MED ORDER — FOLIC ACID 1 MG PO TABS
1.0000 mg | ORAL_TABLET | Freq: Every day | ORAL | Status: DC
  Administered 2018-08-17 – 2018-08-21 (×5): 1 mg via ORAL
  Filled 2018-08-17 (×5): qty 1

## 2018-08-17 MED ORDER — PREDNISONE 50 MG PO TABS
50.0000 mg | ORAL_TABLET | Freq: Every day | ORAL | Status: DC
  Administered 2018-08-18 – 2018-08-21 (×4): 50 mg via ORAL
  Filled 2018-08-17 (×4): qty 1

## 2018-08-17 MED ORDER — TAMSULOSIN HCL 0.4 MG PO CAPS
0.4000 mg | ORAL_CAPSULE | Freq: Every day | ORAL | Status: DC
  Administered 2018-08-17 – 2018-08-21 (×5): 0.4 mg via ORAL
  Filled 2018-08-17 (×5): qty 1

## 2018-08-17 MED ORDER — ACETAMINOPHEN 325 MG PO TABS
650.0000 mg | ORAL_TABLET | Freq: Four times a day (QID) | ORAL | Status: DC | PRN
  Administered 2018-08-17: 650 mg via ORAL
  Filled 2018-08-17: qty 2

## 2018-08-17 MED ORDER — POTASSIUM CHLORIDE CRYS ER 20 MEQ PO TBCR: 40.0000 meq | EXTENDED_RELEASE_TABLET | ORAL | Status: DC

## 2018-08-17 MED ORDER — ONDANSETRON HCL 4 MG/2ML IJ SOLN: 4.0000 mg | Freq: Four times a day (QID) | INTRAMUSCULAR | Status: DC | PRN

## 2018-08-17 MED ORDER — ENOXAPARIN SODIUM 40 MG/0.4ML ~~LOC~~ SOLN
40.0000 mg | SUBCUTANEOUS | Status: DC
  Administered 2018-08-17: 40 mg via SUBCUTANEOUS
  Filled 2018-08-17 (×2): qty 0.4

## 2018-08-17 MED ORDER — MAGNESIUM SULFATE 4 GM/100ML IV SOLN
4.0000 g | INTRAVENOUS | Status: AC
  Administered 2018-08-17 (×2): 4 g via INTRAVENOUS
  Filled 2018-08-17 (×2): qty 100

## 2018-08-17 MED ORDER — DOCUSATE SODIUM 100 MG PO CAPS
100.0000 mg | ORAL_CAPSULE | Freq: Two times a day (BID) | ORAL | Status: DC
  Administered 2018-08-17 – 2018-08-19 (×2): 100 mg via ORAL
  Filled 2018-08-17 (×9): qty 1

## 2018-08-17 MED ORDER — IPRATROPIUM-ALBUTEROL 0.5-2.5 (3) MG/3ML IN SOLN
3.0000 mL | Freq: Four times a day (QID) | RESPIRATORY_TRACT | Status: DC
  Administered 2018-08-17 – 2018-08-18 (×2): 3 mL via RESPIRATORY_TRACT
  Filled 2018-08-17 (×3): qty 3

## 2018-08-17 MED ORDER — POTASSIUM CHLORIDE CRYS ER 20 MEQ PO TBCR
40.0000 meq | EXTENDED_RELEASE_TABLET | Freq: Once | ORAL | Status: AC
  Administered 2018-08-17: 40 meq via ORAL
  Filled 2018-08-17: qty 2

## 2018-08-17 MED ORDER — OXYCODONE HCL 5 MG PO TABS
5.0000 mg | ORAL_TABLET | ORAL | Status: AC
  Administered 2018-08-17: 5 mg via ORAL
  Filled 2018-08-17: qty 1

## 2018-08-17 MED ORDER — ONDANSETRON HCL 4 MG PO TABS: 4.0000 mg | ORAL_TABLET | Freq: Four times a day (QID) | ORAL | Status: DC | PRN

## 2018-08-17 MED ORDER — CYANOCOBALAMIN 1000 MCG/ML IJ SOLN
1000.0000 ug | Freq: Once | INTRAMUSCULAR | Status: DC
  Filled 2018-08-17: qty 1

## 2018-08-17 MED ORDER — VITAMIN B-12 1000 MCG PO TABS
1000.0000 ug | ORAL_TABLET | Freq: Every day | ORAL | Status: DC
  Administered 2018-08-17 – 2018-08-21 (×5): 1000 ug via ORAL
  Filled 2018-08-17 (×6): qty 1

## 2018-08-17 MED ORDER — POTASSIUM CHLORIDE CRYS ER 20 MEQ PO TBCR
40.0000 meq | EXTENDED_RELEASE_TABLET | ORAL | Status: AC
  Administered 2018-08-17 (×3): 40 meq via ORAL
  Filled 2018-08-17 (×4): qty 2

## 2018-08-17 MED ORDER — FUROSEMIDE 10 MG/ML IJ SOLN
40.0000 mg | Freq: Four times a day (QID) | INTRAMUSCULAR | Status: DC
  Administered 2018-08-17 (×3): 40 mg via INTRAVENOUS
  Filled 2018-08-17 (×5): qty 4

## 2018-08-17 MED ORDER — ACETAMINOPHEN 650 MG RE SUPP: 650.0000 mg | Freq: Four times a day (QID) | RECTAL | Status: DC | PRN

## 2018-08-17 MED ORDER — OXYCODONE-ACETAMINOPHEN 5-325 MG PO TABS
1.0000 | ORAL_TABLET | Freq: Four times a day (QID) | ORAL | Status: DC | PRN
  Administered 2018-08-17 – 2018-08-18 (×3): 1 via ORAL
  Filled 2018-08-17 (×3): qty 1

## 2018-08-17 NOTE — ED Notes (Signed)
Breakfast provided to pt.

## 2018-08-17 NOTE — H&P (Signed)
Larry Aldren Bowar. is an 58 y.o. male.   Chief Complaint: Leg pain HPI: The patient with past medical history of CHF, cirrhosis, hypertension and gout presents to the emergency department complaining of lower extremity pain.  The patient reports that is been more difficult for him to walk around the house for at least 1 day.  His legs have been hurting and swelling.  The patient initially attributed this to gout pain and had been taking colchicine without relief.  He has noticed over a period of approximately 2 weeks that his lower extremities have grown in size more than they have in the past.  He denies chest pain or shortness of breath.  After receiving Lasix IV in the emergency department the hospital service was called for further management.  Past Medical History:  Diagnosis Date  . CHF (congestive heart failure) (HCC)   . Cirrhosis (HCC)   . GERD (gastroesophageal reflux disease)   . Gout   . Hypertension   . Pancreatitis     Past Surgical History:  Procedure Laterality Date  . CARDIAC CATHETERIZATION    . COLON SURGERY    . ESOPHAGOGASTRODUODENOSCOPY (EGD) WITH PROPOFOL N/A 10/26/2017   Procedure: ESOPHAGOGASTRODUODENOSCOPY (EGD) WITH PROPOFOL;  Surgeon: [name], MD;  Location: [facility] ENDOSCOPY;  Service: Endoscopy;  Laterality: N/A;    Family History  Problem Relation Age of Onset  . CVA Mother   . CAD Mother   . CAD Father    Social History:  reports that he has been smoking. He has never used smokeless tobacco. He reports that he does not drink alcohol or use drugs.  Allergies:  Allergies  Allergen Reactions  . Penicillins Rash    Has patient had a PCN reaction causing immediate rash, facial/tongue/throat swelling, SOB or lightheadedness with hypotension: No Has patient had a PCN reaction causing severe rash involving mucus membranes or skin necrosis: No Has patient had a PCN reaction that required hospitalization: No Has patient had a PCN reaction occurring within the  last 10 years: No If all of the above answers are "NO", then may proceed with Cephalosporin use.     Prior to Admission medications   Medication Sig Start Date End Date Taking? Authorizing Provider  allopurinol (ZYLOPRIM) 100 MG tablet Take 1 tablet (100 mg total) by mouth daily. 02/25/17   Enid Baas, MD  amLODipine (NORVASC) 10 MG tablet Take 1 tablet (10 mg total) by mouth daily. 02/25/17   Enid Baas, MD  colchicine 0.6 MG tablet Take 0.6-1.2 mg by mouth daily.    [provider]  folic acid (FOLVITE) 1 MG tablet Take 1 tablet (1 mg total) by mouth daily. 10/26/17   Alford Highland, MD  Multiple Vitamin (MULTIVITAMIN WITH MINERALS) TABS tablet Take 1 tablet by mouth daily. 10/26/17   Alford Highland, MD  nicotine (NICODERM CQ - DOSED IN MG/24 HOURS) 21 mg/24hr patch Place 1 patch (21 mg total) onto the skin daily. Generic substitution okay 10/26/17   Alford Highland, MD  oxyCODONE (OXY IR/ROXICODONE) 5 MG immediate release tablet Take 5 mg by mouth every 6 (six) hours as needed for severe pain.    [provider]  pantoprazole (PROTONIX) 40 MG tablet Take 1 tablet (40 mg total) by mouth daily. 10/26/17   Alford Highland, MD  thiamine 100 MG tablet Take 1 tablet (100 mg total) by mouth daily. 10/26/17   Alford Highland, MD     Results for orders placed or performed during the hospital encounter of  08/16/18 (from the past 48 hour(s))  CBC     Status: Abnormal   Collection Time: 08/16/18  4:48 PM  Result Value Ref Range   WBC 18.8 (H) 4.0 - 10.5 K/uL   RBC 2.08 (L) 4.22 - 5.81 MIL/uL   Hemoglobin 7.6 (L) 13.0 - 17.0 g/dL   HCT 96.022.7 (L) 45.439.0 - 09.852.0 %   MCV 109.1 (H) 80.0 - 100.0 fL   MCH 36.5 (H) 26.0 - 34.0 pg   MCHC 33.5 30.0 - 36.0 g/dL   RDW 11.919.7 (H) 14.711.5 - 82.915.5 %   Platelets 338 150 - 400 K/uL   nRBC 0.0 0.0 - 0.2 %    Comment: Performed at [facility], [address] [place], [place] [address]  Troponin I - ONCE - STAT     Status:  Abnormal   Collection Time: 08/16/18  4:48 PM  Result Value Ref Range   Troponin I 0.03 (HH) <0.03 ng/mL    Comment: CRITICAL RESULT CALLED TO, READ BACK BY AND VERIFIED WITH ALISHIA GRAINGER @ 1733 ON 08/16/18 BYJUW Performed at [facility], [address] [place], [place] [address]   Urinalysis, Complete w Microscopic     Status: Abnormal   Collection Time: 08/16/18  9:48 PM  Result Value Ref Range   Color, Urine AMBER (A) YELLOW    Comment: BIOCHEMICALS MAY BE AFFECTED BY COLOR   APPearance CLEAR (A) CLEAR   Specific Gravity, Urine 1.006 1.005 - 1.030   pH 6.0 5.0 - 8.0   Glucose, UA NEGATIVE NEGATIVE mg/dL   Hgb urine dipstick NEGATIVE NEGATIVE   Bilirubin Urine NEGATIVE NEGATIVE   Ketones, ur NEGATIVE NEGATIVE mg/dL   Protein, ur NEGATIVE NEGATIVE mg/dL   Nitrite NEGATIVE NEGATIVE   Leukocytes,Ua NEGATIVE NEGATIVE   RBC / HPF 0-5 0 - 5 RBC/hpf   WBC, UA 0-5 0 - 5 WBC/hpf   Bacteria, UA NONE SEEN NONE SEEN   Squamous Epithelial / LPF 0-5 0 - 5   Hyaline Casts, UA PRESENT     Comment: Performed at [facility], [address] [place], [place] [address]  Troponin I - ONCE - STAT     Status: None   Collection Time: 08/16/18 10:13 PM  Result Value Ref Range   Troponin I <0.03 <0.03 ng/mL    Comment: Performed at [facility], [address] [place], [place] [address]  Brain natriuretic peptide     Status: Abnormal   Collection Time: 08/16/18 10:13 PM  Result Value Ref Range   B Natriuretic Peptide 221.0 (H) 0.0 - 100.0 pg/mL    Comment: Performed at [facility], [address] [place], [place] [address]  Comprehensive metabolic panel     Status: Abnormal   Collection Time: 08/16/18 10:13 PM  Result Value Ref Range   Sodium 133 (L) 135 - 145 mmol/L   Potassium 2.5 (LL) 3.5 - 5.1 mmol/L    Comment: CRITICAL RESULT CALLED TO, READ BACK BY AND VERIFIED WITH ALLY BOWMAN @2318  08/16/18 AKT   Chloride 90 (L) 98 - 111 mmol/L   CO2 31  22 - 32 mmol/L   Glucose, Bld 88 70 - 99 mg/dL   BUN 22 (H) 6 - 20 mg/dL   Creatinine, Ser 4.011.32 (H) 0.61 - 1.24 mg/dL   Calcium 7.3 (L) 8.9 - 10.3 mg/dL   Total Protein 6.1 (L) 6.5 - 8.1 g/dL   Albumin 1.9 (L) 3.5 - 5.0 g/dL   AST 54 (  H) 15 - 41 U/L   ALT 15 0 - 44 U/L   Alkaline Phosphatase 304 (H) 38 - 126 U/L   Total Bilirubin 4.5 (H) 0.3 - 1.2 mg/dL   GFR calc non Af Amer >60 >60 mL/min   GFR calc Af Amer >60 >60 mL/min   Anion gap 12 5 - 15    Comment: Performed at [facility], [address], [place], [place] [address]   Dg Chest 2 View  Result Date: 08/16/2018 CLINICAL DATA:  Shortness of breath and congestion for 2 weeks. History of CHF. EXAM: CHEST - 2 VIEW COMPARISON:  None. FINDINGS: Cardiac silhouette is mildly enlarged. Calcified coronary artery versus stent. Calcified aortic arch. LEFT lung base scarring. No pleural effusion or focal consolidation. No pneumothorax. Old RIGHT rib fractures. Osteopenia. IMPRESSION: Mild cardiomegaly.  No acute pulmonary process. Electronically Signed   By: Awilda Metro M.D.   On: 08/16/2018 17:06    Review of Systems  Constitutional: Negative for chills and fever.  HENT: Negative for sore throat and tinnitus.   Eyes: Negative for blurred vision and redness.  Respiratory: Negative for cough and shortness of breath.   Cardiovascular: Positive for leg swelling. Negative for chest pain, palpitations, orthopnea and PND.  Gastrointestinal: Negative for abdominal pain, diarrhea, nausea and vomiting.  Genitourinary: Negative for dysuria, frequency and urgency.  Musculoskeletal: Positive for joint pain. Negative for myalgias.  Skin: Negative for rash.       No lesions  Neurological: Negative for speech change, focal weakness and weakness.  Endo/Heme/Allergies: Does not bruise/bleed easily.       No temperature intolerance  Psychiatric/Behavioral: Negative for depression and suicidal ideas.    Blood pressure 104/76, pulse (!)  106, temperature 97.6 F (36.4 C), temperature source Oral, resp. rate (!) 22, height 6\' 1"  (1.854 m), weight 90.7 kg, SpO2 90 %. Physical Exam  Vitals reviewed. Constitutional: He is oriented to person, place, and time. He appears well-developed and well-nourished. No distress.  HENT:  Head: Normocephalic and atraumatic.  Mouth/Throat: Oropharynx is clear and moist.  Eyes: Pupils are equal, round, and reactive to light. Conjunctivae and EOM are normal.  Neck: Normal range of motion. Neck supple. No JVD present. No tracheal deviation present. No thyromegaly present.  Cardiovascular: Regular rhythm and normal heart sounds. Tachycardia present. Exam reveals no gallop and no friction rub.  No murmur heard. Respiratory: Effort normal and breath sounds normal. Tachypnea noted. No respiratory distress.  GI: Soft. Bowel sounds are normal. He exhibits no distension. There is no abdominal tenderness.  Genitourinary:    Genitourinary Comments: Deferred   Musculoskeletal: Normal range of motion.        General: Edema present.  Lymphadenopathy:    He has no cervical adenopathy.  Neurological: He is alert and oriented to person, place, and time. No cranial nerve deficit.  Skin: Skin is warm and dry. No rash noted. No erythema.  Psychiatric: He has a normal mood and affect. His behavior is normal. Judgment and thought content normal.     Assessment/Plan This is a 58 year old male admitted for CHF exacerbation. 1.  CHF: Acute on chronic systolic failure.  Continue IV Lasix based on renal function.  The patient likely has decreased oncotic pressure secondary to cirrhosis which will make diuresis more difficult.  He does not have shortness of breath or hypoxia.  I have added tamsulosin to his regimen to help with urinary flow as the patient reports that he sometimes has urinary hesitancy.  2.  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  The patient has not been drinking heavily for quite a long  time.  Continue thiamine and folic acid. 3.  Hypertension: Controlled; continue amlodipine. 4.  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  This does not appear to be an acute flare at this time but he may continue his colchicine as directed. 5.  DVT prophylaxis: Lovenox 6.  GI prophylaxis: Pantoprazole per home regimen The patient is a full code.  Time spent on admission orders and patient care approximately 45 minutes  Arnaldo Natal, MD 08/17/2018, 3:38 AM

## 2018-08-17 NOTE — ED Notes (Signed)
Admitting team at bedside.

## 2018-08-17 NOTE — Progress Notes (Signed)
[employer] - [place] [facility]   PATIENT NAME: Larry Andrade    MR#:  [id number]  DATE OF BIRTH:  1960/07/26  SUBJECTIVE:  CHIEF COMPLAINT:   Chief Complaint  Patient presents with  . Shortness of Breath  . Leg Swelling  . Back Pain   Came with leg pains. Have gout. Also noted to have some edema and likely CHF.  REVIEW OF SYSTEMS:  CONSTITUTIONAL: No fever, fatigue or weakness.  EYES: No blurred or double vision.  EARS, NOSE, AND THROAT: No tinnitus or ear pain.  RESPIRATORY: No cough, shortness of breath, wheezing or hemoptysis.  CARDIOVASCULAR: No chest pain, orthopnea, edema.  GASTROINTESTINAL: No nausea, vomiting, diarrhea or abdominal pain.  GENITOURINARY: No dysuria, hematuria.  ENDOCRINE: No polyuria, nocturia,  HEMATOLOGY: No anemia, easy bruising or bleeding SKIN: No rash or lesion. MUSCULOSKELETAL: both foot and hands pain. NEUROLOGIC: No tingling, numbness, weakness.  PSYCHIATRY: No anxiety or depression.   ROS  DRUG ALLERGIES:   Allergies  Allergen Reactions  . Penicillins Rash    Has patient had a PCN reaction causing immediate rash, facial/tongue/throat swelling, SOB or lightheadedness with hypotension: No Has patient had a PCN reaction causing severe rash involving mucus membranes or skin necrosis: No Has patient had a PCN reaction that required hospitalization: No Has patient had a PCN reaction occurring within the last 10 years: No If all of the above answers are "NO", then may proceed with Cephalosporin use.     VITALS:  Blood pressure 101/75, pulse (!) 104, temperature 97.6 F (36.4 C), temperature source Oral, resp. rate (!) 22, height 6\' 1"  (1.854 m), weight 90.7 kg, SpO2 96 %.  PHYSICAL EXAMINATION:  GENERAL:  58 y.o.-year-old patient lying in the bed with no acute distress.  EYES: Pupils equal, round, reactive to light and accommodation. No scleral icterus. Extraocular muscles intact.  HEENT: Head atraumatic,  normocephalic. Oropharynx and nasopharynx clear.  NECK:  Supple, no jugular venous distention. No thyroid enlargement, no tenderness.  LUNGS: Normal breath sounds bilaterally, no wheezing, some crepitation. No use of accessory muscles of respiration.  CARDIOVASCULAR: S1, S2 normal. No murmurs, rubs, or gallops.  ABDOMEN: Soft, nontender, nondistended. Bowel sounds present. No organomegaly or mass.  EXTREMITIES: No pedal edema, cyanosis, or clubbing. Some deformed toes and fingers. NEUROLOGIC: Cranial nerves II through XII are intact. Muscle strength 5/5 in all extremities. Sensation intact. Gait not checked.  PSYCHIATRIC: The patient is alert and oriented x 3.  SKIN: No obvious rash, lesion, or ulcer.   Physical Exam LABORATORY PANEL:   CBC Recent Labs  Lab 08/17/18 1158  WBC 10.5  HGB 6.4*  HCT 18.9*  PLT 223   ------------------------------------------------------------------------------------------------------------------  Chemistries  Recent Labs  Lab 08/17/18 1158  NA 132*  K 2.8*  CL 89*  CO2 34*  GLUCOSE 104*  BUN 21*  CREATININE 1.32*  CALCIUM 6.9*  MG 0.9*  AST 65*  ALT 16  ALKPHOS 302*  BILITOT 4.3*   ------------------------------------------------------------------------------------------------------------------  Cardiac Enzymes Recent Labs  Lab 08/16/18 1648 08/16/18 2213  TROPONINI 0.03* <0.03   ------------------------------------------------------------------------------------------------------------------  RADIOLOGY:  Dg Chest 2 View  Result Date: 08/16/2018 CLINICAL DATA:  Shortness of breath and congestion for 2 weeks. History of CHF. EXAM: CHEST - 2 VIEW COMPARISON:  None. FINDINGS: Cardiac silhouette is mildly enlarged. Calcified coronary artery versus stent. Calcified aortic arch. LEFT lung base scarring. No pleural effusion or focal consolidation. No pneumothorax. Old RIGHT rib fractures. Osteopenia. IMPRESSION: Mild cardiomegaly.  No  acute pulmonary process. Electronically Signed   By: Awilda Metro M.D.   On: 08/16/2018 17:06    ASSESSMENT AND PLAN:   Active Problems:   Acute on chronic systolic CHF (congestive heart failure) (HCC)  This is a 58 year old male admitted for CHF exacerbation. 1.  CHF: Acute on chronic systolic failure. Continue IV Lasix based on renal function. The patient likely has decreased oncotic pressure secondary to cirrhosis which will make diuresis more difficult. He does not have shortness of breath or hypoxia.   tamsulosin to help with urinary flow as the patient reports that he sometimes has urinary hesitancy. 2.  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  The patient has not been drinking heavily for quite a long time.  Continue thiamine and folic acid. 3.  Hypertension: Controlled; continue amlodipine. 4.  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  This does not appear to be an acute flare at this time but he may continue his colchicine as directed.  Added percocet for pain control. 5.  DVT prophylaxis: Lovenox 6.  GI prophylaxis: Pantoprazole per home regimen    All the records are reviewed and case discussed with Care Management/Social Workerr. Management plans discussed with the patient, family and they are in agreement.  CODE STATUS: full.  TOTAL TIME TAKING CARE OF THIS PATIENT: 35 minutes.     POSSIBLE D/C IN 1-2 DAYS, DEPENDING ON CLINICAL CONDITION.   Altamese Dilling M.D on 08/17/2018   Between 7am to 6pm - Pager - [phone]  After 6pm go to [email or web address] - password [email or web address]  [facility]  Office  [phone]  CC: Primary care physician; System, Pcp Not In  Note: This dictation was prepared with Dragon dictation along with smaller phrase technology. Any transcriptional errors that result from this process are  unintentional.

## 2018-08-17 NOTE — Progress Notes (Signed)
*  PRELIMINARY RESULTS* Echocardiogram 2D Echocardiogram has been performed.  Cristela Blue 08/17/2018, 2:52 PM

## 2018-08-17 NOTE — Consult Note (Signed)
PHARMACY CONSULT NOTE - FOLLOW UP  Pharmacy Consult for Electrolyte Monitoring and Replacement   Recent Labs: Potassium (mmol/L)  Date Value  08/17/2018 2.8 (L)   Magnesium (mg/dL)  Date Value  54/49/2010 0.9 (LL)   Calcium (mg/dL)  Date Value  01/06/1974 6.9 (L)   Calcium, Total (PTH) (mg/dL)  Date Value  88/32/5498 9.4   Albumin (g/dL)  Date Value  26/41/5830 1.8 (L)   Sodium (mmol/L)  Date Value  08/17/2018 132 (L)   Correct Calcium: 8.66  Assessment: Pharmacy consulted for electrolyte monitoring and replacement in 58 yo male with PMH of alcohol abuse. Patient is admitted with acute CHF exacerbation.   Goal of Therapy:  Electrolytes WNL   Plan:  2/20: 2.8, Mg: 0.9, Corrected Calcium: 8.66. Patient is receiving  furosemide 40mg  IV every 6 hours. Patient already has KCL every 4 hours x 3 doses ordered. Will order Magnesium 4gm IV x 2 doses.  Will recheck electrolytes with AM labs and continue to replace as needed.    Gardner Candle, PharmD, BCPS Clinical Pharmacist 08/17/2018 1:50 PM

## 2018-08-17 NOTE — ED Notes (Signed)
ED TO INPATIENT HANDOFF REPORT  Name/Age/Gender Larry HumanJohn Bessler Jr. 58 y.o. male  Code Status    Code Status Orders  (From admission, onward)         Start     Ordered   08/17/18 0431  Full code  Continuous     08/17/18 0430        Code Status History    Date Active Date Inactive Code Status Order ID Comments User Context   10/25/2017 0147 10/26/2017 1717 Full Code [id number]  Cammy CopaMaier, Angela, MD Inpatient   02/22/2017 1122 02/24/2017 1413 Full Code [id number]  Alford HighlandWieting, Richard, MD ED      Home/SNF/Other Home  Chief Complaint SOB  Level of Care/Admitting Diagnosis ED Disposition    ED Disposition Condition Comment   Admit  Hospital Area: [facility] [[id number]]  Level of Care: Med-Surg [16]  Diagnosis: Acute on chronic systolic CHF (congestive heart failure) [facility](HCC) [[id number]]) [[id number]]  Admitting Physician: Arnaldo NatalIAMOND, MICHAEL S [[id number]]  Attending Physician: Arnaldo NatalDIAMOND, MICHAEL S [[id number]]  Estimated length of stay: past midnight tomorrow  Certification:: I certify this patient will need inpatient services for at least 2 midnights  PT Class (Do Not Modify): Inpatient [101]  PT Acc Code (Do Not Modify): Private [1]       Medical History Past Medical History:  Diagnosis Date  . CHF (congestive heart failure) (HCC)   . Cirrhosis (HCC)   . GERD (gastroesophageal reflux disease)   . Gout   . Hypertension   . Pancreatitis     Allergies Allergies  Allergen Reactions  . Penicillins Rash    Has patient had a PCN reaction causing immediate rash, facial/tongue/throat swelling, SOB or lightheadedness with hypotension: No Has patient had a PCN reaction causing severe rash involving mucus membranes or skin necrosis: No Has patient had a PCN reaction that required hospitalization: No Has patient had a PCN reaction occurring within the last 10 years: No If all of the above answers are "NO", then may proceed with Cephalosporin use.     IV Location/Drains/Wounds Patient  Lines/Drains/Airways Status   Active Line/Drains/Airways    Name:   Placement date:   Placement time:   Site:   Days:   Peripheral IV 08/16/18 Left Forearm   08/16/18    2222    Forearm   1          Labs/Imaging Results for orders placed or performed during the hospital encounter of 08/16/18 (from the past 48 hour(s))  CBC     Status: Abnormal   Collection Time: 08/16/18  4:48 PM  Result Value Ref Range   WBC 18.8 (H) 4.0 - 10.5 K/uL   RBC 2.08 (L) 4.22 - 5.81 MIL/uL   Hemoglobin 7.6 (L) 13.0 - 17.0 g/dL   HCT 44.022.7 (L) 10.239.0 - 72.552.0 %   MCV 109.1 (H) 80.0 - 100.0 fL   MCH 36.5 (H) 26.0 - 34.0 pg   MCHC 33.5 30.0 - 36.0 g/dL   RDW 36.619.7 (H) 44.011.5 - 34.715.5 %   Platelets 338 150 - 400 K/uL   nRBC 0.0 0.0 - 0.2 %    Comment: Performed at [facility], [address] [place], [place] [address]  Troponin I - ONCE - STAT     Status: Abnormal   Collection Time: 08/16/18  4:48 PM  Result Value Ref Range   Troponin I 0.03 (HH) <0.03 ng/mL    Comment: CRITICAL RESULT CALLED TO, READ BACK BY AND VERIFIED WITH ALISHIA GRAINGER @  1733 ON 08/16/18 BYJUW Performed at [facility], [address] [place], [place] [address]   Urinalysis, Complete w Microscopic     Status: Abnormal   Collection Time: 08/16/18  9:48 PM  Result Value Ref Range   Color, Urine AMBER (A) YELLOW    Comment: BIOCHEMICALS MAY BE AFFECTED BY COLOR   APPearance CLEAR (A) CLEAR   Specific Gravity, Urine 1.006 1.005 - 1.030   pH 6.0 5.0 - 8.0   Glucose, UA NEGATIVE NEGATIVE mg/dL   Hgb urine dipstick NEGATIVE NEGATIVE   Bilirubin Urine NEGATIVE NEGATIVE   Ketones, ur NEGATIVE NEGATIVE mg/dL   Protein, ur NEGATIVE NEGATIVE mg/dL   Nitrite NEGATIVE NEGATIVE   Leukocytes,Ua NEGATIVE NEGATIVE   RBC / HPF 0-5 0 - 5 RBC/hpf   WBC, UA 0-5 0 - 5 WBC/hpf   Bacteria, UA NONE SEEN NONE SEEN   Squamous Epithelial / LPF 0-5 0 - 5   Hyaline Casts, UA PRESENT     Comment: Performed at [facility],  [address] [place], [place] [address]  Troponin I - ONCE - STAT     Status: None   Collection Time: 08/16/18 10:13 PM  Result Value Ref Range   Troponin I <0.03 <0.03 ng/mL    Comment: Performed at [facility], [address] [place], [place] [address]  Brain natriuretic peptide     Status: Abnormal   Collection Time: 08/16/18 10:13 PM  Result Value Ref Range   B Natriuretic Peptide 221.0 (H) 0.0 - 100.0 pg/mL    Comment: Performed at [facility], [address] [place], [place] [address]  Comprehensive metabolic panel     Status: Abnormal   Collection Time: 08/16/18 10:13 PM  Result Value Ref Range   Sodium 133 (L) 135 - 145 mmol/L   Potassium 2.5 (LL) 3.5 - 5.1 mmol/L    Comment: CRITICAL RESULT CALLED TO, READ BACK BY AND VERIFIED WITH ALLY BOWMAN @2318  08/16/18 AKT   Chloride 90 (L) 98 - 111 mmol/L   CO2 31 22 - 32 mmol/L   Glucose, Bld 88 70 - 99 mg/dL   BUN 22 (H) 6 - 20 mg/dL   Creatinine, Ser 0.271.32 (H) 0.61 - 1.24 mg/dL   Calcium 7.3 (L) 8.9 - 10.3 mg/dL   Total Protein 6.1 (L) 6.5 - 8.1 g/dL   Albumin 1.9 (L) 3.5 - 5.0 g/dL   AST 54 (H) 15 - 41 U/L   ALT 15 0 - 44 U/L   Alkaline Phosphatase 304 (H) 38 - 126 U/L   Total Bilirubin 4.5 (H) 0.3 - 1.2 mg/dL   GFR calc non Af Amer >60 >60 mL/min   GFR calc Af Amer >60 >60 mL/min   Anion gap 12 5 - 15    Comment: Performed at [facility], [address] [place], [place] [address]  TSH     Status: Abnormal   Collection Time: 08/16/18 10:13 PM  Result Value Ref Range   TSH 8.532 (H) 0.350 - 4.500 uIU/mL    Comment: Performed by a 3rd Generation assay with a functional sensitivity of <=0.01 uIU/mL. Performed at [facility], [address] [place], [place] [address]   CBC     Status: Abnormal   Collection Time: 08/17/18 11:58 AM  Result Value Ref Range   WBC 10.5 4.0 - 10.5 K/uL   RBC 1.73 (L) 4.22 - 5.81 MIL/uL   Hemoglobin 6.4 (L) 13.0 - 17.0 g/dL  HCT 18.9 (L) 39.0 -  52.0 %   MCV 109.2 (H) 80.0 - 100.0 fL   MCH 37.0 (H) 26.0 - 34.0 pg   MCHC 33.9 30.0 - 36.0 g/dL   RDW 16.0 (H) 10.9 - 32.3 %   Platelets 223 150 - 400 K/uL   nRBC 0.0 0.0 - 0.2 %    Comment: Performed at [facility], [address] [place], [place] [address]   Dg Chest 2 View  Result Date: 08/16/2018 CLINICAL DATA:  Shortness of breath and congestion for 2 weeks. History of CHF. EXAM: CHEST - 2 VIEW COMPARISON:  None. FINDINGS: Cardiac silhouette is mildly enlarged. Calcified coronary artery versus stent. Calcified aortic arch. LEFT lung base scarring. No pleural effusion or focal consolidation. No pneumothorax. Old RIGHT rib fractures. Osteopenia. IMPRESSION: Mild cardiomegaly.  No acute pulmonary process. Electronically Signed   By: Awilda Metro M.D.   On: 08/16/2018 17:06    Pending Labs Unresulted Labs (From admission, onward)    Start     Ordered   08/24/18 0500  Creatinine, serum  (enoxaparin (LOVENOX)    CrCl >/= 30 ml/min)  Weekly,   STAT    Comments:  while on enoxaparin therapy    08/17/18 0430   08/18/18 0500  CBC  Tomorrow morning,   STAT     08/17/18 1050   08/18/18 0500  Comprehensive metabolic panel  Tomorrow morning,   STAT     08/17/18 1050   08/17/18 1051  Bilirubin, direct  Add-on,   AD     08/17/18 1050   08/17/18 1051  Comprehensive metabolic panel  Once,   STAT     08/17/18 1050   08/17/18 1049  Magnesium  Add-on,   AD     08/17/18 1048   08/17/18 1049  Vitamin B12  Add-on,   AD     08/17/18 1048   08/17/18 1049  Folate  Add-on,   AD     08/17/18 1048          Vitals/Pain Today's Vitals   08/17/18 0800 08/17/18 0900 08/17/18 1030 08/17/18 1130  BP: 122/72 105/78 (!) 87/68 111/75  Pulse: (!) 115 (!) 116 (!) 110 (!) 112  Resp: 15 19 (!) 21   Temp:      TempSrc:      SpO2: 96% 91% 91% 95%  Weight:      Height:      PainSc:        Isolation Precautions No active isolations  Medications Medications  enoxaparin (LOVENOX)  injection 40 mg (40 mg Subcutaneous Given 08/17/18 0457)  acetaminophen (TYLENOL) tablet 650 mg (650 mg Oral Given 08/17/18 0954)    Or  acetaminophen (TYLENOL) suppository 650 mg ( Rectal See Alternative 08/17/18 0954)  docusate sodium (COLACE) capsule 100 mg (100 mg Oral Given 08/17/18 0954)  ondansetron (ZOFRAN) tablet 4 mg (has no administration in time range)    Or  ondansetron (ZOFRAN) injection 4 mg (has no administration in time range)  furosemide (LASIX) injection 40 mg (40 mg Intravenous Given 08/17/18 1203)  tamsulosin (FLOMAX) capsule 0.4 mg (0.4 mg Oral Given 08/17/18 0532)  potassium chloride SA (K-DUR,KLOR-CON) CR tablet 40 mEq (40 mEq Oral Given 08/17/18 1203)  oxyCODONE-acetaminophen (PERCOCET/ROXICET) 5-325 MG per tablet 1 tablet (has no administration in time range)  oxyCODONE (Oxy IR/ROXICODONE) immediate release tablet 5 mg (5 mg Oral Given 08/16/18 2212)  furosemide (LASIX) injection 60 mg (60 mg Intravenous Given 08/16/18 2223)  potassium  chloride SA (K-DUR,KLOR-CON) CR tablet 40 mEq (40 mEq Oral Given 08/17/18 0022)  oxyCODONE (Oxy IR/ROXICODONE) immediate release tablet 5 mg (5 mg Oral Given 08/17/18 0532)  potassium chloride SA (K-DUR,KLOR-CON) CR tablet 40 mEq (40 mEq Oral Given 08/17/18 0757)    Mobility walks with device Able to stand and use urinal at bedside

## 2018-08-18 ENCOUNTER — Inpatient Hospital Stay: Payer: Medicare Other

## 2018-08-18 DIAGNOSIS — F17210 Nicotine dependence, cigarettes, uncomplicated: Secondary | ICD-10-CM

## 2018-08-18 DIAGNOSIS — K7030 Alcoholic cirrhosis of liver without ascites: Secondary | ICD-10-CM

## 2018-08-18 DIAGNOSIS — E538 Deficiency of other specified B group vitamins: Secondary | ICD-10-CM

## 2018-08-18 DIAGNOSIS — I509 Heart failure, unspecified: Secondary | ICD-10-CM

## 2018-08-18 DIAGNOSIS — Z7289 Other problems related to lifestyle: Secondary | ICD-10-CM

## 2018-08-18 DIAGNOSIS — D539 Nutritional anemia, unspecified: Secondary | ICD-10-CM

## 2018-08-18 LAB — COMPREHENSIVE METABOLIC PANEL
ALT: 16 U/L (ref 0–44)
AST: 68 U/L — ABNORMAL HIGH (ref 15–41)
Albumin: 1.7 g/dL — ABNORMAL LOW (ref 3.5–5.0)
Alkaline Phosphatase: 300 U/L — ABNORMAL HIGH (ref 38–126)
Anion gap: 9 (ref 5–15)
BUN: 22 mg/dL — AB (ref 6–20)
CO2: 33 mmol/L — ABNORMAL HIGH (ref 22–32)
Calcium: 7 mg/dL — ABNORMAL LOW (ref 8.9–10.3)
Chloride: 89 mmol/L — ABNORMAL LOW (ref 98–111)
Creatinine, Ser: 1.5 mg/dL — ABNORMAL HIGH (ref 0.61–1.24)
GFR calc Af Amer: 60 mL/min — ABNORMAL LOW (ref >60)
GFR calc non Af Amer: 52 mL/min — ABNORMAL LOW (ref >60)
Glucose, Bld: 117 mg/dL — ABNORMAL HIGH (ref 70–99)
Potassium: 3.1 mmol/L — ABNORMAL LOW (ref 3.5–5.1)
Sodium: 131 mmol/L — ABNORMAL LOW (ref 135–145)
Total Bilirubin: 3.1 mg/dL — ABNORMAL HIGH (ref 0.3–1.2)
Total Protein: 5.4 g/dL — ABNORMAL LOW (ref 6.5–8.1)

## 2018-08-18 LAB — CBC
HCT: 18.9 % — ABNORMAL LOW (ref 39.0–52.0)
Hemoglobin: 6.4 g/dL — ABNORMAL LOW (ref 13.0–17.0)
MCH: 36.8 pg — ABNORMAL HIGH (ref 26.0–34.0)
MCHC: 33.9 g/dL (ref 30.0–36.0)
MCV: 108.6 fL — ABNORMAL HIGH (ref 80.0–100.0)
Platelets: 200 10*3/uL (ref 150–400)
RBC: 1.74 MIL/uL — ABNORMAL LOW (ref 4.22–5.81)
RDW: 19.7 % — ABNORMAL HIGH (ref 11.5–15.5)
WBC: 10.5 10*3/uL (ref 4.0–10.5)
nRBC: 0 % (ref 0.0–0.2)

## 2018-08-18 LAB — MAGNESIUM: MAGNESIUM: 2.4 mg/dL (ref 1.7–2.4)

## 2018-08-18 LAB — SYNOVIAL CELL COUNT + DIFF, W/ CRYSTALS
EOSINOPHILS-SYNOVIAL: 0 %
Lymphocytes-Synovial Fld: 11 %
Monocyte-Macrophage-Synovial Fluid: 10 %
Neutrophil, Synovial: 79 %
WBC, Synovial: 2836 /mm3 — ABNORMAL HIGH (ref 0–200)

## 2018-08-18 LAB — ABO/RH: ABO/RH(D): AB POS

## 2018-08-18 LAB — PREPARE RBC (CROSSMATCH)

## 2018-08-18 LAB — PATHOLOGIST SMEAR REVIEW

## 2018-08-18 LAB — LACTATE DEHYDROGENASE: LDH: 208 U/L — AB (ref 98–192)

## 2018-08-18 LAB — PHOSPHORUS: Phosphorus: 1.7 mg/dL — ABNORMAL LOW (ref 2.5–4.6)

## 2018-08-18 MED ORDER — SODIUM CHLORIDE 0.9% IV SOLUTION
Freq: Once | INTRAVENOUS | Status: AC
  Administered 2018-08-18: 14:00:00 via INTRAVENOUS

## 2018-08-18 MED ORDER — COLCHICINE 0.6 MG PO TABS
0.6000 mg | ORAL_TABLET | Freq: Every day | ORAL | Status: DC
  Administered 2018-08-18 – 2018-08-21 (×4): 0.6 mg via ORAL
  Filled 2018-08-18 (×4): qty 1

## 2018-08-18 MED ORDER — OXYCODONE HCL 5 MG PO TABS
5.0000 mg | ORAL_TABLET | ORAL | Status: DC | PRN
  Administered 2018-08-18 – 2018-08-21 (×13): 5 mg via ORAL
  Filled 2018-08-18 (×14): qty 1

## 2018-08-18 MED ORDER — PANTOPRAZOLE SODIUM 40 MG PO TBEC
40.0000 mg | DELAYED_RELEASE_TABLET | Freq: Every day | ORAL | Status: DC
  Administered 2018-08-18 – 2018-08-21 (×4): 40 mg via ORAL
  Filled 2018-08-18 (×4): qty 1

## 2018-08-18 MED ORDER — ALBUMIN HUMAN 25 % IV SOLN
25.0000 g | Freq: Two times a day (BID) | INTRAVENOUS | Status: AC
  Administered 2018-08-18 – 2018-08-19 (×2): 25 g via INTRAVENOUS
  Filled 2018-08-18 (×3): qty 100

## 2018-08-18 MED ORDER — IPRATROPIUM-ALBUTEROL 0.5-2.5 (3) MG/3ML IN SOLN
3.0000 mL | Freq: Four times a day (QID) | RESPIRATORY_TRACT | Status: DC | PRN
  Administered 2018-08-19 – 2018-08-21 (×3): 3 mL via RESPIRATORY_TRACT
  Filled 2018-08-18 (×3): qty 3

## 2018-08-18 MED ORDER — K PHOS MONO-SOD PHOS DI & MONO 155-852-130 MG PO TABS
500.0000 mg | ORAL_TABLET | ORAL | Status: AC
  Administered 2018-08-18 (×3): 500 mg via ORAL
  Filled 2018-08-18 (×3): qty 2

## 2018-08-18 MED ORDER — POTASSIUM CHLORIDE CRYS ER 20 MEQ PO TBCR
40.0000 meq | EXTENDED_RELEASE_TABLET | ORAL | Status: AC
  Administered 2018-08-18 (×2): 40 meq via ORAL
  Filled 2018-08-18 (×2): qty 2

## 2018-08-18 NOTE — Consult Note (Addendum)
PHARMACY CONSULT NOTE - FOLLOW UP  Pharmacy Consult for Electrolyte Monitoring and Replacement   Recent Labs: Potassium (mmol/L)  Date Value  08/18/2018 3.1 (L)   Magnesium (mg/dL)  Date Value  79/07/4095 2.4   Calcium (mg/dL)  Date Value  35/32/9924 7.0 (L)   Calcium, Total (PTH) (mg/dL)  Date Value  26/83/4196 9.4   Albumin (g/dL)  Date Value  22/29/7989 1.7 (L)   Phosphorus (mg/dL)  Date Value  21/19/4174 1.7 (L)   Sodium (mmol/L)  Date Value  08/18/2018 131 (L)   Correct Calcium: 8.84  Assessment: Pharmacy consulted for electrolyte monitoring and replacement in 58 yo male with PMH of alcohol abuse. Patient is admitted with acute CHF exacerbation.   Goal of Therapy:  Electrolytes WNL   Plan:  2/21: K: 3.1, Phos: 1.7, Mg: 2.4.  MD has already ordered KCL every 4 hours x2 doses. Will order KPhos Neutral 500mg  every 4 hours x 3 doses.   Will recheck electrolytes with AM labs and continue to replace as needed.    Gardner Candle, PharmD, BCPS Clinical Pharmacist 08/18/2018 7:18 AM

## 2018-08-18 NOTE — Consult Note (Signed)
Reason for Consult: Gout  Referring Physician: Hospitalist  Brown Human.   HPI: 58-year-old white male.  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  History of gouty arthritis.  Known tophi.  Previously on allopurinol 100 twice a day.  Heavy alcohol use.  Prior CT showed question fatty liver. For 2 weeks he has had pain in both knees some in his ankles.  Was admitted with significant anemia.  Some shortness of breath.  Creatinine up to 1.5.  Both knees and ankles were hurting.  Hard to ambulate.  Has had some swelling in the hands as well as across the DIPs.  Elbow pain bilaterally.  No recent fevers.  Labs were pertinent for white count of 10,000.  Hemoglobin 6.4 albumin 1.2 creatinine 1.5 elevated alkaline phosphatase and AST.  PMH: Hypertension.  Liver disease.  Congestive heart failure.  Anemia.  SURGICAL HISTORY: Partial colectomy.  Family History: Negative for gout  Social History: Regular alcohol use  Allergies:  Allergies  Allergen Reactions  . Penicillins Rash    Has patient had a PCN reaction causing immediate rash, facial/tongue/throat swelling, SOB or lightheadedness with hypotension: No Has patient had a PCN reaction causing severe rash involving mucus membranes or skin necrosis: No Has patient had a PCN reaction that required hospitalization: No Has patient had a PCN reaction occurring within the last 10 years: No If all of the above answers are "NO", then may proceed with Cephalosporin use.     Medications:  Scheduled: . colchicine  0.6 mg Oral Daily  . cyanocobalamin  1,000 mcg Intramuscular Once  . docusate sodium  100 mg Oral BID  . enoxaparin (LOVENOX) injection  40 mg Subcutaneous Q24H  . folic acid  1 mg Oral Daily  . ipratropium-albuterol  3 mL Nebulization Q6H  . pantoprazole  40 mg Oral Daily  . phosphorus  500 mg Oral Q4H  . predniSONE  50 mg Oral Q breakfast  . tamsulosin  0.4 mg Oral Daily  . vitamin B-12  1,000 mcg Oral Daily        ROS: No fever.  No chest pain.   Legs have been swelling.  No definite rashes.  No abdominal pain   PHYSICAL EXAM: Blood pressure 107/78, pulse (!) 101, temperature 97.8 F (36.6 C), temperature source Oral, resp. rate 18, height 6\' 1"  (1.854 m), weight 89.1 kg, SpO2 97 %. Ill-appearing male.  Pain with moving his knees and ankles.  Sclera nonicteric.  Skin without rashes.  Oropharynx clear.  Distant lung sounds.  No significant murmur.  Distended abdomen.  Not tender.  Did not feel liver or spleen edge.  2-3+ lower extremity edema.  The ankle. Musculoskeletal: Good range of motion shoulders.  Bilateral elbow tophi.  Numerous hand tophi with synovitis including DIP synovitis.  Hips move reasonably.  Bilateral knee effusions.  Decreased range of motion both ankles.  Question effusion.  Tophi second toe bilaterally  Procedure: Right knee prepped sterile manner.  Aspirated 25cc of milky solution.  Injected with 2 cc Xylocaine 1 cc Kenalog  Procedure #2 left knee prepped in sterile manner.  Aspirated 25 cc of milky solution.  Injected with 2 cc Xylocaine and 1 cc Kenalog.  Sent for microscopy and culture.  Assessment: Acute flare of gout Tophaceous gout Liver failure.  Low albumin.  Elevated trends Eminase is edema Renal insufficiency Significant anemia.  Question blood loss versus primary hematologic abnormality Prior partial colectomy  Recommendations: Agree with prednisone Suspect injections will help his knees and ankles Will  need to be on remittive agent.  Previously was on allopurinol.  If his liver functions and creatinine kidney function are stable he could be discharged on 50 mg of allopurinol.  I would be glad to follow-up as gout in the office Timid about accelerating colchicine.  May suppresses his bone marrow with this level of renal insufficiency and liver disease.  May discontinue  Kandyce Rud 08/18/2018, 2:54 PM

## 2018-08-18 NOTE — Progress Notes (Signed)
[facility] [place] [facility]   PATIENT NAME: Larry Andrade    MR#:  [id number]  DATE OF BIRTH:  16-Mar-1961  SUBJECTIVE:  CHIEF COMPLAINT:   Chief Complaint  Patient presents with  . Shortness of Breath  . Leg Swelling  . Back Pain   Came with leg pains. Have gout. Also noted to have some edema and likely CHF. Swelling and pain in hands and feet, knees.  REVIEW OF SYSTEMS:  CONSTITUTIONAL: No fever, fatigue or weakness.  EYES: No blurred or double vision.  EARS, NOSE, AND THROAT: No tinnitus or ear pain.  RESPIRATORY: No cough, shortness of breath, wheezing or hemoptysis.  CARDIOVASCULAR: No chest pain, orthopnea, edema.  GASTROINTESTINAL: No nausea, vomiting, diarrhea or abdominal pain.  GENITOURINARY: No dysuria, hematuria.  ENDOCRINE: No polyuria, nocturia,  HEMATOLOGY: No anemia, easy bruising or bleeding SKIN: No rash or lesion. MUSCULOSKELETAL: both foot and hands pain. NEUROLOGIC: No tingling, numbness, weakness.  PSYCHIATRY: No anxiety or depression.   ROS  DRUG ALLERGIES:   Allergies  Allergen Reactions  . Penicillins Rash    Has patient had a PCN reaction causing immediate rash, facial/tongue/throat swelling, SOB or lightheadedness with hypotension: No Has patient had a PCN reaction causing severe rash involving mucus membranes or skin necrosis: No Has patient had a PCN reaction that required hospitalization: No Has patient had a PCN reaction occurring within the last 10 years: No If all of the above answers are "NO", then may proceed with Cephalosporin use.     VITALS:  Blood pressure 107/78, pulse (!) 101, temperature 97.8 F (36.6 C), temperature source Oral, resp. rate 18, height 6\' 1"  (1.854 m), weight 89.1 kg, SpO2 97 %.  PHYSICAL EXAMINATION:  GENERAL:  58 y.o.-year-old patient lying in the bed with no acute distress.  EYES: Pupils equal, round, reactive to light and accommodation. No scleral icterus. Extraocular muscles  intact.  HEENT: Head atraumatic, normocephalic. Oropharynx and nasopharynx clear.  NECK:  Supple, no jugular venous distention. No thyroid enlargement, no tenderness.  LUNGS: Normal breath sounds bilaterally, no wheezing, some crepitation. No use of accessory muscles of respiration.  CARDIOVASCULAR: S1, S2 normal. No murmurs, rubs, or gallops.  ABDOMEN: Soft, mild tender, distended. Bowel sounds present. No organomegaly or mass.  EXTREMITIES: No pedal edema, cyanosis, or clubbing. Some deformed toes and fingers, significant swelling in both first metacarpophalangeal joints and metatarsophalangeal joints.  Swelling on both knees. NEUROLOGIC: Cranial nerves II through XII are intact. Muscle strength 4/5 in all extremities. Sensation intact. Gait not checked.  PSYCHIATRIC: The patient is alert and oriented x 3.  SKIN: No obvious rash, lesion, or ulcer.   Physical Exam LABORATORY PANEL:   CBC Recent Labs  Lab 08/18/18 0304  WBC 10.5  HGB 6.4*  HCT 18.9*  PLT 200   ------------------------------------------------------------------------------------------------------------------  Chemistries  Recent Labs  Lab 08/18/18 0304  NA 131*  K 3.1*  CL 89*  CO2 33*  GLUCOSE 117*  BUN 22*  CREATININE 1.50*  CALCIUM 7.0*  MG 2.4  AST 68*  ALT 16  ALKPHOS 300*  BILITOT 3.1*   ------------------------------------------------------------------------------------------------------------------  Cardiac Enzymes Recent Labs  Lab 08/16/18 1648 08/16/18 2213  TROPONINI 0.03* <0.03   ------------------------------------------------------------------------------------------------------------------  RADIOLOGY:  Dg Chest 2 View  Result Date: 08/16/2018 CLINICAL DATA:  Shortness of breath and congestion for 2 weeks. History of CHF. EXAM: CHEST - 2 VIEW COMPARISON:  None. FINDINGS: Cardiac silhouette is mildly enlarged. Calcified coronary artery versus stent. Calcified aortic  arch. LEFT lung  base scarring. No pleural effusion or focal consolidation. No pneumothorax. Old RIGHT rib fractures. Osteopenia. IMPRESSION: Mild cardiomegaly.  No acute pulmonary process. Electronically Signed   By: Awilda Metro M.D.   On: 08/16/2018 17:06    ASSESSMENT AND PLAN:   Active Problems:   Acute on chronic systolic CHF (congestive heart failure) (HCC)  This is a 58 year old male admitted for CHF exacerbation. 1.  CHF: Acute on chronic systolic failure. Continue IV Lasix based on renal function. The patient likely has decreased oncotic pressure secondary to cirrhosis which will make diuresis more difficult. He does not have shortness of breath or hypoxia.   tamsulosin to help with urinary flow as the patient reports that he sometimes has urinary hesitancy. I have stopped IV Lasix today due to worsening in the renal function and ordered some albumin injections to help as he has very low albumin. 2.  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  The patient has not been drinking heavily for quite a long time.  Continue thiamine and folic acid. He seems to be having some ascites, ordered ultrasound. 3.  Hypertension: Controlled; continue amlodipine. 4.  Gout attack:  appear to be an acute flare at this time  Added percocet for pain control. Colchicine and prednisone. Called rheumatology consult.  He had done drainage from the joint and sent to lab for cultures. 5.  Macrocytic anemia-this seems to be secondary to malnutrition due to alcohol abuse and he will also have history of colectomy so it could be malabsorption. Ordered blood transfusion 1 unit and called hematology consult. Also ordered LDH and haptoglobin to check for hemolysis. 6.  GI prophylaxis: Pantoprazole per home regimen    All the records are reviewed and case discussed with Care Management/Social Workerr. Management plans discussed with the patient, family and they are in agreement.  CODE STATUS: full.  TOTAL TIME  TAKING CARE OF THIS PATIENT: 35 minutes.     POSSIBLE D/C IN 1-2 DAYS, DEPENDING ON CLINICAL CONDITION.   Altamese Dilling M.D on 08/18/2018   Between 7am to 6pm - Pager - [phone]  After 6pm go to [email or web address] - password [email or web address]  [facility]  Office  [phone]  CC: Primary care physician; System, Pcp Not In  Note: This dictation was prepared with Dragon dictation along with smaller phrase technology. Any transcriptional errors that result from this process are unintentional.

## 2018-08-18 NOTE — Consult Note (Signed)
[facility]  Date of admission:  08/17/2018  Inpatient day:  08/18/2018  Consulting physician:  Dr. Altamese Dilling.  Reason for Consultation:  Macrocytic anemia.  Chief Complaint: Larry Andrade. is a 58 y.o. male who was admitted from the emergency room with a CHF exacerbation.  HPI:  The patient is unaware of a history of cirrhosis or CHF.  He drinks liquor 1-2 drinks/day, sometimes more.  He denies any history of hepatitis.  He describes a transfusion several years ago in [place].  He states that part of his colon was removed about 5-6 years ago secondary to a mass.  He is unaware of any follow-up colonoscopies.  He does not a history of diverticulosis.  He denies any melena, hematochezia or hematuria.    He is unaware of any issues with his blood counts.  He states that he came to the ER because of pain and swelling in his legs x 2 weeks.  He describes issues with gout.    He describes his diet as good.  He eats meat every day.  He eats green beans.  He denies any pica.    Review of labs dating back to 02/22/2017 reveal a chronic macrocytic anemia.  Prior to this admission on 10/25/2017, hematocrit was 25.5, hemoglobin 8.9, MCV 117.3, platelets 261,000, WBC 11,000.  CBC on admission included a hematocrit of 22.7, hemoglobin 7.6, MCV 109.1, platelets 338,000, WBC 18,800.  CBC today reveals a hematocrit of 18.9, hemoglobin 6.4, MCV 108.6, platelets 200,000, WBC 10,500.  LDH was 208 (98-192). Creatinine was 1.5.  Albumen was 1.7.  Protein was 5.4.  AST was 68, ALT 16, alkaline phosphatase 300, and bilirubin 3.1.  B12 was 1096.  Folate was 5.1 (low).  Bilirubin 4.3 (direct 2.5) on 08/17/2018.  Urinalysis revealed no bilirubin or hematuria.  Peripheral smear revealed a macrocytic anemia, normal leukocyte and differential, and normal platelet count and morphology.  Symptomatically, he denies any weight loss.  He notes chronic shortness of breath and cough.  He  notes pain in his hands and legs.   Past Medical History:  Diagnosis Date  . CHF (congestive heart failure) (HCC)   . Cirrhosis (HCC)   . GERD (gastroesophageal reflux disease)   . Gout   . Hypertension   . Pancreatitis     Past Surgical History:  Procedure Laterality Date  . CARDIAC CATHETERIZATION    . COLON SURGERY    . ESOPHAGOGASTRODUODENOSCOPY (EGD) WITH PROPOFOL N/A 10/26/2017   Procedure: ESOPHAGOGASTRODUODENOSCOPY (EGD) WITH PROPOFOL;  Surgeon: [name], MD;  Location: [facility] ENDOSCOPY;  Service: Endoscopy;  Laterality: N/A;    Family History  Problem Relation Age of Onset  . CVA Mother   . CAD Mother   . CAD Father     Social History:  reports that he has been smoking. He has never used smokeless tobacco. He reports current alcohol use. He reports that he does not use drugs. The patient has smoked 1 ppd x 45 years.  He is alone today.  Allergies:  Allergies  Allergen Reactions  . Penicillins Rash    Has patient had a PCN reaction causing immediate rash, facial/tongue/throat swelling, SOB or lightheadedness with hypotension: No Has patient had a PCN reaction causing severe rash involving mucus membranes or skin necrosis: No Has patient had a PCN reaction that required hospitalization: No Has patient had a PCN reaction occurring within the last 10 years: No If all of the above answers are "NO", then may  proceed with Cephalosporin use.     Medications Prior to Admission  Medication Sig Dispense Refill  . albuterol (PROVENTIL) (2.5 MG/3ML) 0.083% nebulizer solution Take 2.5 mg by nebulization every 6 (six) hours as needed for wheezing or shortness of breath.    . allopurinol (ZYLOPRIM) 100 MG tablet Take 1 tablet (100 mg total) by mouth daily. 30 tablet 2  . colchicine 0.6 MG tablet Take 0.6-1.2 mg by mouth daily.    . metoprolol tartrate (LOPRESSOR) 25 MG tablet Take 25 mg by mouth 2 (two) times daily.    Marland Kitchen oxyCODONE (OXY IR/ROXICODONE) 5 MG immediate release  tablet Take 5 mg by mouth every 6 (six) hours as needed for severe pain.    . Multiple Vitamin (MULTIVITAMIN WITH MINERALS) TABS tablet Take 1 tablet by mouth daily. 30 tablet 0  . pantoprazole (PROTONIX) 40 MG tablet Take 1 tablet (40 mg total) by mouth daily. 30 tablet 0    Review of Systems: GENERAL:  Fatigue.  No fevers, sweats or weight loss. PERFORMANCE STATUS (ECOG):  1-2 HEENT:  No visual changes, runny nose, sore throat, mouth sores or tenderness. Lungs: Shortness of breath.  Cough.  No hemoptysis. Cardiac:  No chest pain, palpitations, orthopnea, or PND. GI:  No nausea, vomiting, diarrhea, constipation, melena or hematochezia. GU:  No urgency, frequency, dysuria, or hematuria. Musculoskeletal:  Back pain. Gout in hands and feet.  No muscle tenderness. Extremities:  Bilateral leg pain or swelling. Skin:  Bruises easily.  No rashes or skin changes. Neuro:  Feet numb.  No headache, focal weakness, balance or coordination issues. Endocrine:  No diabetes, thyroid issues, hot flashes or night sweats. Psych:  No mood changes, depression or anxiety. Pain:  Hands and bilateral leg pain. Review of systems:  All other systems reviewed and found to be negative.  Physical Exam:  Blood pressure 113/80, pulse (!) 103, temperature 98.4 F (36.9 C), temperature source Oral, resp. rate 18, height 6\' 1"  (1.854 m), weight 196 lb 6.9 oz (89.1 kg), SpO2 98 %.  GENERAL:  Fatigued appearing gentleman sitting comfortably on the medical unit in no acute distress. MENTAL STATUS:  Alert and oriented to person, place and time. HEAD:  Dark hair.  Normocephalic, atraumatic, face symmetric, no Cushingoid features. EYES:  Brown eyes.  Pupils equal round and reactive to light and accomodation.  No conjunctivitis or scleral icterus. ENT:  Oropharynx clear without lesion.  Tongue normal.  Poor dentition.  Mucous membranes moist.  RESPIRATORY:  Clear to auscultation without rales, wheezes or  rhonchi. CARDIOVASCULAR:  Regular rate and rhythm without murmur, rub or gallop. ABDOMEN:  Fully round.  Umbilical/incision hernia (reducible.  Soft, non-tender, with active bowel sounds, and no appreciable hepatosplenomegaly.  Upper quadrants full.  No masses. SKIN:  Small upper extremity ecchymosis.  No rashes, ulcers or lesions. EXTREMITIES: Thin arms.  2-3+ bilateral lower extremity edema.  Bilateral tophi in hands (greatest MCP left hand).  Isolated DIP tender.  S/p bilateral knee taps.  No skin discoloration or tenderness.  No palpable cords. LYMPH NODES: No palpable cervical, supraclavicular, axillary or inguinal adenopathy  NEUROLOGICAL: Unremarkable. PSYCH:  Appropriate.   Results for orders placed or performed during the hospital encounter of 08/16/18 (from the past 48 hour(s))  CBC     Status: Abnormal   Collection Time: 08/16/18  4:48 PM  Result Value Ref Range   WBC 18.8 (H) 4.0 - 10.5 K/uL   RBC 2.08 (L) 4.22 - 5.81 MIL/uL   Hemoglobin 7.6 (  L) 13.0 - 17.0 g/dL   HCT 16.122.7 (L) 09.639.0 - 04.552.0 %   MCV 109.1 (H) 80.0 - 100.0 fL   MCH 36.5 (H) 26.0 - 34.0 pg   MCHC 33.5 30.0 - 36.0 g/dL   RDW 40.919.7 (H) 81.111.5 - 91.415.5 %   Platelets 338 150 - 400 K/uL   nRBC 0.0 0.0 - 0.2 %    Comment: Performed at [facility], [address] [place], [place] [address]  Troponin I - ONCE - STAT     Status: Abnormal   Collection Time: 08/16/18  4:48 PM  Result Value Ref Range   Troponin I 0.03 (HH) <0.03 ng/mL    Comment: CRITICAL RESULT CALLED TO, READ BACK BY AND VERIFIED WITH ALISHIA GRAINGER @ 1733 ON 08/16/18 BYJUW Performed at [facility], [address] [place], [place] [address]   Urinalysis, Complete w Microscopic     Status: Abnormal   Collection Time: 08/16/18  9:48 PM  Result Value Ref Range   Color, Urine AMBER (A) YELLOW    Comment: BIOCHEMICALS MAY BE AFFECTED BY COLOR   APPearance CLEAR (A) CLEAR   Specific Gravity, Urine 1.006 1.005 - 1.030   pH 6.0 5.0 -  8.0   Glucose, UA NEGATIVE NEGATIVE mg/dL   Hgb urine dipstick NEGATIVE NEGATIVE   Bilirubin Urine NEGATIVE NEGATIVE   Ketones, ur NEGATIVE NEGATIVE mg/dL   Protein, ur NEGATIVE NEGATIVE mg/dL   Nitrite NEGATIVE NEGATIVE   Leukocytes,Ua NEGATIVE NEGATIVE   RBC / HPF 0-5 0 - 5 RBC/hpf   WBC, UA 0-5 0 - 5 WBC/hpf   Bacteria, UA NONE SEEN NONE SEEN   Squamous Epithelial / LPF 0-5 0 - 5   Hyaline Casts, UA PRESENT     Comment: Performed at [facility], [address] [place], [place] [address]  Troponin I - ONCE - STAT     Status: None   Collection Time: 08/16/18 10:13 PM  Result Value Ref Range   Troponin I <0.03 <0.03 ng/mL    Comment: Performed at [facility], [address] [place], [place] [address]  Brain natriuretic peptide     Status: Abnormal   Collection Time: 08/16/18 10:13 PM  Result Value Ref Range   B Natriuretic Peptide 221.0 (H) 0.0 - 100.0 pg/mL    Comment: Performed at [facility], [address] [place], [place] [address]  Comprehensive metabolic panel     Status: Abnormal   Collection Time: 08/16/18 10:13 PM  Result Value Ref Range   Sodium 133 (L) 135 - 145 mmol/L   Potassium 2.5 (LL) 3.5 - 5.1 mmol/L    Comment: CRITICAL RESULT CALLED TO, READ BACK BY AND VERIFIED WITH ALLY BOWMAN @2318  08/16/18 AKT   Chloride 90 (L) 98 - 111 mmol/L   CO2 31 22 - 32 mmol/L   Glucose, Bld 88 70 - 99 mg/dL   BUN 22 (H) 6 - 20 mg/dL   Creatinine, Ser 1.321.32 (H) 0.61 - 1.24 mg/dL   Calcium 7.3 (L) 8.9 - 10.3 mg/dL   Total Protein 6.1 (L) 6.5 - 8.1 g/dL   Albumin 1.9 (L) 3.5 - 5.0 g/dL   AST 54 (H) 15 - 41 U/L   ALT 15 0 - 44 U/L   Alkaline Phosphatase 304 (H) 38 - 126 U/L   Total Bilirubin 4.5 (H) 0.3 - 1.2 mg/dL   GFR calc non Af Amer >60 >60 mL/min   GFR calc Af Amer >60 >  60 mL/min   Anion gap 12 5 - 15    Comment: Performed at [facility], [address] [place], [place] [address]  TSH     Status: Abnormal   Collection Time:  08/16/18 10:13 PM  Result Value Ref Range   TSH 8.532 (H) 0.350 - 4.500 uIU/mL    Comment: Performed by a 3rd Generation assay with a functional sensitivity of <=0.01 uIU/mL. Performed at [facility], [address] [place], [place] [address]   Magnesium     Status: Abnormal   Collection Time: 08/17/18 11:58 AM  Result Value Ref Range   Magnesium 0.9 (LL) 1.7 - 2.4 mg/dL    Comment: CRITICAL RESULT CALLED TO, READ BACK BY AND VERIFIED WITH  AMBER PAYNE  AT 1309 08/17/18 SDR Performed at [facility], [address] [place], [place] [address]   Vitamin B12     Status: Abnormal   Collection Time: 08/17/18 11:58 AM  Result Value Ref Range   Vitamin B-12 1,096 (H) 180 - 914 pg/mL    Comment: (NOTE) This assay is not validated for testing neonatal or myeloproliferative syndrome specimens for Vitamin B12 levels. Performed at [facility], [address] [place], [place] [address]   Folate     Status: Abnormal   Collection Time: 08/17/18 11:58 AM  Result Value Ref Range   Folate 5.1 (L) >5.9 ng/mL    Comment: Performed at [facility], [address] [place], [place] [address]  Bilirubin, direct     Status: Abnormal   Collection Time: 08/17/18 11:58 AM  Result Value Ref Range   Bilirubin, Direct 2.5 (H) 0.0 - 0.2 mg/dL    Comment: Performed at [facility], [address] [place], [place] [address]  Comprehensive metabolic panel     Status: Abnormal   Collection Time: 08/17/18 11:58 AM  Result Value Ref Range   Sodium 132 (L) 135 - 145 mmol/L   Potassium 2.8 (L) 3.5 - 5.1 mmol/L   Chloride 89 (L) 98 - 111 mmol/L   CO2 34 (H) 22 - 32 mmol/L   Glucose, Bld 104 (H) 70 - 99 mg/dL   BUN 21 (H) 6 - 20 mg/dL   Creatinine, Ser 0.24 (H) 0.61 - 1.24 mg/dL   Calcium 6.9 (L) 8.9 - 10.3 mg/dL   Total Protein 5.6 (L) 6.5 - 8.1 g/dL   Albumin 1.8 (L) 3.5 - 5.0 g/dL   AST 65 (H) 15 - 41 U/L   ALT 16 0 - 44 U/L   Alkaline Phosphatase 302 (H)  38 - 126 U/L   Total Bilirubin 4.3 (H) 0.3 - 1.2 mg/dL   GFR calc non Af Amer >60 >60 mL/min   GFR calc Af Amer >60 >60 mL/min   Anion gap 9 5 - 15    Comment: Performed at [facility], [address] [place], [place] [address]  CBC     Status: Abnormal   Collection Time: 08/17/18 11:58 AM  Result Value Ref Range   WBC 10.5 4.0 - 10.5 K/uL   RBC 1.73 (L) 4.22 - 5.81 MIL/uL   Hemoglobin 6.4 (L) 13.0 - 17.0 g/dL   HCT 32.9 (L) 92.4 - 26.8 %   MCV 109.2 (H) 80.0 - 100.0 fL   MCH 37.0 (H) 26.0 - 34.0 pg   MCHC 33.9 30.0 - 36.0 g/dL   RDW 34.1 (H) 96.2 - 22.9 %   Platelets 223 150 - 400 K/uL  nRBC 0.0 0.0 - 0.2 %    Comment: Performed at [facility], [address] [place], [place] [address]  CBC     Status: Abnormal   Collection Time: 08/18/18  3:04 AM  Result Value Ref Range   WBC 10.5 4.0 - 10.5 K/uL   RBC 1.74 (L) 4.22 - 5.81 MIL/uL   Hemoglobin 6.4 (L) 13.0 - 17.0 g/dL   HCT 60.4 (L) 54.0 - 98.1 %   MCV 108.6 (H) 80.0 - 100.0 fL   MCH 36.8 (H) 26.0 - 34.0 pg   MCHC 33.9 30.0 - 36.0 g/dL   RDW 19.1 (H) 47.8 - 29.5 %   Platelets 200 150 - 400 K/uL   nRBC 0.0 0.0 - 0.2 %    Comment: Performed at [facility], [address] [place], [place] [address]  Comprehensive metabolic panel     Status: Abnormal   Collection Time: 08/18/18  3:04 AM  Result Value Ref Range   Sodium 131 (L) 135 - 145 mmol/L   Potassium 3.1 (L) 3.5 - 5.1 mmol/L   Chloride 89 (L) 98 - 111 mmol/L   CO2 33 (H) 22 - 32 mmol/L   Glucose, Bld 117 (H) 70 - 99 mg/dL   BUN 22 (H) 6 - 20 mg/dL   Creatinine, Ser 8.65 (H) 0.61 - 1.24 mg/dL   Calcium 7.0 (L) 8.9 - 10.3 mg/dL   Total Protein 5.4 (L) 6.5 - 8.1 g/dL   Albumin 1.7 (L) 3.5 - 5.0 g/dL   AST 68 (H) 15 - 41 U/L   ALT 16 0 - 44 U/L   Alkaline Phosphatase 300 (H) 38 - 126 U/L   Total Bilirubin 3.1 (H) 0.3 - 1.2 mg/dL   GFR calc non Af Amer 52 (L) >60 mL/min   GFR calc Af Amer 60 (L) >60 mL/min   Anion gap 9 5 - 15     Comment: Performed at [facility], [address] [place], [place] [address]  Magnesium     Status: None   Collection Time: 08/18/18  3:04 AM  Result Value Ref Range   Magnesium 2.4 1.7 - 2.4 mg/dL    Comment: Performed at [facility], [address] [place], [place] [address]  Phosphorus     Status: Abnormal   Collection Time: 08/18/18  3:04 AM  Result Value Ref Range   Phosphorus 1.7 (L) 2.5 - 4.6 mg/dL    Comment: Performed at [facility], [address], [place], [place] [address]  ABO/Rh     Status: None   Collection Time: 08/18/18  3:04 AM  Result Value Ref Range   ABO/RH(D)      AB POS Performed at [facility], [address] [place], [place] [address]   Prepare RBC     Status: None   Collection Time: 08/18/18  9:30 AM  Result Value Ref Range   Order Confirmation      ORDER PROCESSED BY BLOOD BANK Performed at [facility], [address] [place], [place] [address]   Type and screen [facility]     Status: None (Preliminary result)   Collection Time: 08/18/18  9:53 AM  Result Value Ref Range   ABO/RH(D) AB POS    Antibody Screen NEG    Sample Expiration 08/21/2018    Unit Number [id number]    Blood Component Type RED CELLS,LR    Unit division 00    Status of Unit  ISSUED    Transfusion Status OK TO TRANSFUSE    Crossmatch Result      Compatible Performed at [facility], [address] [place], [place] [address]   Lactate dehydrogenase     Status: Abnormal   Collection Time: 08/18/18  9:57 AM  Result Value Ref Range   LDH 208 (H) 98 - 192 U/L    Comment: Performed at [facility], [address] [place], [place] [address]   Dg Chest 2 View  Result Date: 08/16/2018 CLINICAL DATA:  Shortness of breath and congestion for 2 weeks. History of CHF. EXAM: CHEST - 2 VIEW COMPARISON:  None. FINDINGS: Cardiac silhouette is mildly enlarged. Calcified coronary artery  versus stent. Calcified aortic arch. LEFT lung base scarring. No pleural effusion or focal consolidation. No pneumothorax. Old RIGHT rib fractures. Osteopenia. IMPRESSION: Mild cardiomegaly.  No acute pulmonary process. Electronically Signed   By: Awilda Metro M.D.   On: 08/16/2018 17:06    Assessment:  The patient is a 58 y.o. gentleman with a history of cirrhosis secondary to alcohol use.  He drinks alcohol daily.  He notes a history of transfusion years ago and a colectomy for a mass (no records available) 5-6 years ago.  He has had macrocytic RBC indices since at least 01/2017.  CBC on admission included a hematocrit of 22.7, hemoglobin 7.6, MCV 109.1, platelets 338,000, WBC 18,800.  CBC today reveals a hematocrit of 18.9, hemoglobin 6.4, MCV 108.6, platelets 200,000, WBC 10,500.  LDH was 208 (98-192). Creatinine was 1.5.  Albumen was 1.7.  Protein was 5.4.  AST was 68, ALT 16, alkaline phosphatase 300, and bilirubin 3.1.  B12 was 1096.  Folate was 5.1 (low).  Bilirubin 4.3 (direct 2.5) on 08/17/2018.  Urinalysis revealed no bilirubin or hematuria.  Peripheral smear revealed a macrocytic anemia, normal leukocyte and differential, and normal platelet count and morphology.  Symptomatically, he notes pain and swelling in his legs and hands.  He has active gout.  He denies any bleeding.  Plan:   1.  Macrocytic anemia  Etiology of macrocytosis is likely due to underlying liver disease and folate deficiency.  B12 was normal.  Folate is low and is now on folic acid 1 mg/day.  LDH is low and thus do not suspect hemolysis.   Check retic, TSH, free T4, hepatitis B and C serologies.  Ensure no hidden iron deficiency with ferritin, iron studies.  Guaiac all stools.  Anticipate follow-up with GI to assess liver disease.  Thank you for allowing me to participate in Larry Andrade. 's care.  I will follow him closely with you while hospitalized and after discharge in the outpatient  department.   Rosey Bath, MD  08/18/2018, 2:22 PM

## 2018-08-19 LAB — CBC
HCT: 19.3 % — ABNORMAL LOW (ref 39.0–52.0)
Hemoglobin: 6.6 g/dL — ABNORMAL LOW (ref 13.0–17.0)
MCH: 35.1 pg — ABNORMAL HIGH (ref 26.0–34.0)
MCHC: 34.2 g/dL (ref 30.0–36.0)
MCV: 102.7 fL — AB (ref 80.0–100.0)
Platelets: 196 10*3/uL (ref 150–400)
RBC: 1.88 MIL/uL — AB (ref 4.22–5.81)
RDW: 21.5 % — ABNORMAL HIGH (ref 11.5–15.5)
WBC: 13.4 10*3/uL — ABNORMAL HIGH (ref 4.0–10.5)
nRBC: 0 % (ref 0.0–0.2)

## 2018-08-19 LAB — COMPREHENSIVE METABOLIC PANEL
ALT: 15 U/L (ref 0–44)
AST: 56 U/L — ABNORMAL HIGH (ref 15–41)
Albumin: 2.2 g/dL — ABNORMAL LOW (ref 3.5–5.0)
Alkaline Phosphatase: 259 U/L — ABNORMAL HIGH (ref 38–126)
Anion gap: 13 (ref 5–15)
BUN: 22 mg/dL — AB (ref 6–20)
CO2: 27 mmol/L (ref 22–32)
CREATININE: 1.27 mg/dL — AB (ref 0.61–1.24)
Calcium: 7 mg/dL — ABNORMAL LOW (ref 8.9–10.3)
Chloride: 89 mmol/L — ABNORMAL LOW (ref 98–111)
GFR calc Af Amer: >60 mL/min (ref >60)
GFR calc non Af Amer: >60 mL/min (ref >60)
Glucose, Bld: 245 mg/dL — ABNORMAL HIGH (ref 70–99)
Potassium: 4.6 mmol/L (ref 3.5–5.1)
Sodium: 129 mmol/L — ABNORMAL LOW (ref 135–145)
Total Bilirubin: 2.6 mg/dL — ABNORMAL HIGH (ref 0.3–1.2)
Total Protein: 6 g/dL — ABNORMAL LOW (ref 6.5–8.1)

## 2018-08-19 LAB — MAGNESIUM: Magnesium: 1.8 mg/dL (ref 1.7–2.4)

## 2018-08-19 LAB — TSH: TSH: 2.912 u[IU]/mL (ref 0.350–4.500)

## 2018-08-19 LAB — IRON AND TIBC: Iron: 88 ug/dL (ref 45–182)

## 2018-08-19 LAB — FERRITIN: Ferritin: 1003 ng/mL — ABNORMAL HIGH (ref 24–336)

## 2018-08-19 LAB — PREPARE RBC (CROSSMATCH)

## 2018-08-19 LAB — RETICULOCYTES
Immature Retic Fract: 15.8 % (ref 2.3–15.9)
RBC.: 1.88 MIL/uL — ABNORMAL LOW (ref 4.22–5.81)
Retic Count, Absolute: 22.9 10*3/uL (ref 19.0–186.0)
Retic Ct Pct: 1.2 % (ref 0.4–3.1)

## 2018-08-19 LAB — PHOSPHORUS: Phosphorus: 1.9 mg/dL — ABNORMAL LOW (ref 2.5–4.6)

## 2018-08-19 LAB — URIC ACID: Uric Acid, Serum: 11 mg/dL — ABNORMAL HIGH (ref 3.7–8.6)

## 2018-08-19 LAB — T4, FREE: Free T4: 0.89 ng/dL (ref 0.82–1.77)

## 2018-08-19 MED ORDER — SODIUM CHLORIDE 0.9% IV SOLUTION
Freq: Once | INTRAVENOUS | Status: AC
  Administered 2018-08-19: 16:00:00 via INTRAVENOUS

## 2018-08-19 MED ORDER — SODIUM PHOSPHATES 45 MMOLE/15ML IV SOLN
20.0000 mmol | Freq: Once | INTRAVENOUS | Status: AC
  Administered 2018-08-19: 20 mmol via INTRAVENOUS
  Filled 2018-08-19: qty 6.67

## 2018-08-19 MED ORDER — FUROSEMIDE 10 MG/ML IJ SOLN
20.0000 mg | Freq: Once | INTRAMUSCULAR | Status: AC
  Administered 2018-08-19: 20 mg via INTRAVENOUS
  Filled 2018-08-19: qty 2

## 2018-08-19 NOTE — Consult Note (Signed)
Reason for Consult: Leg edema shortness of breath congestive heart failure Referring Physician: Dr. Joycelyn Rua hospitalist  Larry Andrade Harvis Ratzlaff. is an 58 y.o. male.  HPI: Patient presents with history of congestive heart failure cirrhosis hypertension gout hyperlipidemia hypertension shortness of breath.  Patient complained of leg swelling.  Leg edema has caused some leg pain and swelling denies any trauma given intravenous Lasix therapy to help with fluid and congestion which seemed to help denies any significant chest pain here for further cardiac assessment  Past Medical History:  Diagnosis Date  . CHF (congestive heart failure) (HCC)   . Cirrhosis (HCC)   . GERD (gastroesophageal reflux disease)   . Gout   . Hypertension   . Pancreatitis     Past Surgical History:  Procedure Laterality Date  . CARDIAC CATHETERIZATION    . COLON SURGERY    . ESOPHAGOGASTRODUODENOSCOPY (EGD) WITH PROPOFOL N/A 10/26/2017   Procedure: ESOPHAGOGASTRODUODENOSCOPY (EGD) WITH PROPOFOL;  Surgeon: [name], MD;  Location: [facility] ENDOSCOPY;  Service: Endoscopy;  Laterality: N/A;    Family History  Problem Relation Age of Onset  . CVA Mother   . CAD Mother   . CAD Father     Social History:  reports that he has been smoking. He has never used smokeless tobacco. He reports current alcohol use. He reports that he does not use drugs.  Allergies:  Allergies  Allergen Reactions  . Penicillins Rash    Has patient had a PCN reaction causing immediate rash, facial/tongue/throat swelling, SOB or lightheadedness with hypotension: No Has patient had a PCN reaction causing severe rash involving mucus membranes or skin necrosis: No Has patient had a PCN reaction that required hospitalization: No Has patient had a PCN reaction occurring within the last 10 years: No If all of the above answers are "NO", then may proceed with Cephalosporin use.     Medications: I have reviewed the patient's current  medications.  Results for orders placed or performed during the hospital encounter of 08/16/18 (from the past 48 hour(s))  Magnesium     Status: Abnormal   Collection Time: 08/17/18 11:58 AM  Result Value Ref Range   Magnesium 0.9 (LL) 1.7 - 2.4 mg/dL    Comment: CRITICAL RESULT CALLED TO, READ BACK BY AND VERIFIED WITH  AMBER PAYNE  AT 1309 08/17/18 SDR Performed at [facility], [address] [place], [place] [address]   Vitamin B12     Status: Abnormal   Collection Time: 08/17/18 11:58 AM  Result Value Ref Range   Vitamin B-12 1,096 (H) 180 - 914 pg/mL    Comment: (NOTE) This assay is not validated for testing neonatal or myeloproliferative syndrome specimens for Vitamin B12 levels. Performed at [facility], [address] [place], [place] [address]   Folate     Status: Abnormal   Collection Time: 08/17/18 11:58 AM  Result Value Ref Range   Folate 5.1 (L) >5.9 ng/mL    Comment: Performed at [facility], [address] [place], [place] [address]  Bilirubin, direct     Status: Abnormal   Collection Time: 08/17/18 11:58 AM  Result Value Ref Range   Bilirubin, Direct 2.5 (H) 0.0 - 0.2 mg/dL    Comment: Performed at [facility], [address] [place], [place] [address]  Comprehensive metabolic panel     Status: Abnormal   Collection Time: 08/17/18 11:58 AM  Result Value Ref Range   Sodium 132 (L) 135 - 145  mmol/L   Potassium 2.8 (L) 3.5 - 5.1 mmol/L   Chloride 89 (L) 98 - 111 mmol/L   CO2 34 (H) 22 - 32 mmol/L   Glucose, Bld 104 (H) 70 - 99 mg/dL   BUN 21 (H) 6 - 20 mg/dL   Creatinine, Ser 1.61 (H) 0.61 - 1.24 mg/dL   Calcium 6.9 (L) 8.9 - 10.3 mg/dL   Total Protein 5.6 (L) 6.5 - 8.1 g/dL   Albumin 1.8 (L) 3.5 - 5.0 g/dL   AST 65 (H) 15 - 41 U/L   ALT 16 0 - 44 U/L   Alkaline Phosphatase 302 (H) 38 - 126 U/L   Total Bilirubin 4.3 (H) 0.3 - 1.2 mg/dL   GFR calc non Af Amer >60 >60 mL/min   GFR calc Af Amer >60 >60 mL/min    Anion gap 9 5 - 15    Comment: Performed at [facility], [address] [place], [place] [address]  CBC     Status: Abnormal   Collection Time: 08/17/18 11:58 AM  Result Value Ref Range   WBC 10.5 4.0 - 10.5 K/uL   RBC 1.73 (L) 4.22 - 5.81 MIL/uL   Hemoglobin 6.4 (L) 13.0 - 17.0 g/dL   HCT 54.0 (L) 98.1 - 19.1 %   MCV 109.2 (H) 80.0 - 100.0 fL   MCH 37.0 (H) 26.0 - 34.0 pg   MCHC 33.9 30.0 - 36.0 g/dL   RDW 47.8 (H) 29.5 - 62.1 %   Platelets 223 150 - 400 K/uL   nRBC 0.0 0.0 - 0.2 %    Comment: Performed at [facility], [address] [place], [place] [address]  CBC     Status: Abnormal   Collection Time: 08/18/18  3:04 AM  Result Value Ref Range   WBC 10.5 4.0 - 10.5 K/uL   RBC 1.74 (L) 4.22 - 5.81 MIL/uL   Hemoglobin 6.4 (L) 13.0 - 17.0 g/dL   HCT 78.4 (L) 69.6 - 29.5 %   MCV 108.6 (H) 80.0 - 100.0 fL   MCH 36.8 (H) 26.0 - 34.0 pg   MCHC 33.9 30.0 - 36.0 g/dL   RDW 28.4 (H) 13.2 - 44.0 %   Platelets 200 150 - 400 K/uL   nRBC 0.0 0.0 - 0.2 %    Comment: Performed at [facility], [address] [place], [place] [address]  Comprehensive metabolic panel     Status: Abnormal   Collection Time: 08/18/18  3:04 AM  Result Value Ref Range   Sodium 131 (L) 135 - 145 mmol/L   Potassium 3.1 (L) 3.5 - 5.1 mmol/L   Chloride 89 (L) 98 - 111 mmol/L   CO2 33 (H) 22 - 32 mmol/L   Glucose, Bld 117 (H) 70 - 99 mg/dL   BUN 22 (H) 6 - 20 mg/dL   Creatinine, Ser 5.36 (H) 0.61 - 1.24 mg/dL   Calcium 7.0 (L) 8.9 - 10.3 mg/dL   Total Protein 5.4 (L) 6.5 - 8.1 g/dL   Albumin 1.7 (L) 3.5 - 5.0 g/dL   AST 68 (H) 15 - 41 U/L   ALT 16 0 - 44 U/L   Alkaline Phosphatase 300 (H) 38 - 126 U/L   Total Bilirubin 3.1 (H) 0.3 - 1.2 mg/dL   GFR calc non Af Amer 52 (L) >60 mL/min   GFR calc Af Amer 60 (L) >60 mL/min   Anion gap 9 5 - 15    Comment: Performed at [facility], [address], [place], [place] [address]  Magnesium     Status: None   Collection  Time: 08/18/18  3:04 AM  Result Value Ref Range   Magnesium 2.4 1.7 - 2.4 mg/dL    Comment: Performed at [facility], [address] [place], [place] [address]  Phosphorus     Status: Abnormal   Collection Time: 08/18/18  3:04 AM  Result Value Ref Range   Phosphorus 1.7 (L) 2.5 - 4.6 mg/dL    Comment: Performed at [facility], [address] [place], [place] [address]  ABO/Rh     Status: None   Collection Time: 08/18/18  3:04 AM  Result Value Ref Range   ABO/RH(D)      AB POS Performed at [facility], [address] [place], [place] [address]   Prepare RBC     Status: None   Collection Time: 08/18/18  9:30 AM  Result Value Ref Range   Order Confirmation      ORDER PROCESSED BY BLOOD BANK Performed at [facility], [address] [place], [place] [address]   Type and screen [facility]     Status: None (Preliminary result)   Collection Time: 08/18/18  9:53 AM  Result Value Ref Range   ABO/RH(D) AB POS    Antibody Screen NEG    Sample Expiration 08/21/2018    Unit Number [id number]    Blood Component Type RED CELLS,LR    Unit division 00    Status of Unit ISSUED    Transfusion Status OK TO TRANSFUSE    Crossmatch Result      Compatible Performed at [facility], [address] [place], [place] [address]   Lactate dehydrogenase     Status: Abnormal   Collection Time: 08/18/18  9:57 AM  Result Value Ref Range   LDH 208 (H) 98 - 192 U/L    Comment: Performed at [facility], [address] [place], [place] [address]  Synovial cell count + diff, w/ crystals     Status: Abnormal   Collection Time: 08/18/18  4:18 PM  Result Value Ref Range   Color, Synovial YELLOW (A) YELLOW   Appearance-Synovial TURBID (A) CLEAR   Crystals, Fluid EXTRACELLULAR MONOSODIUM URATE CRYSTALS    WBC, Synovial 2,836 (H) 0 - 200 /cu mm   Neutrophil, Synovial 79 %   Lymphocytes-Synovial Fld 11 %    Monocyte-Macrophage-Synovial Fluid 10 %   Eosinophils-Synovial 0 %    Comment: Performed at [facility], [address], [place], [place] [address]  Body fluid culture     Status: None (Preliminary result)   Collection Time: 08/18/18  4:18 PM  Result Value Ref Range   Specimen Description      KNEE Performed at [facility], [address], [place], [place] [address]    Special Requests      JOINT FLUID Performed at [facility], [address] [place], [place] [address]    Gram Stain      RARE WBC PRESENT, PREDOMINANTLY PMN NO ORGANISMS SEEN Performed at [facility], [address] [place], [place] [address]    Culture PENDING    Report Status PENDING   Pathologist smear review     Status: None   Collection Time: 08/18/18  4:18 PM  Result Value Ref Range   Path Review Peripheral blood smear is reviewed.     Comment: Normal leukocyte count  and differential. Macrocytic anemia. Normal platelet count and morphology. Reviewed by Beryle Quant, M.D. Performed at [facility], [address], [place], [place] [address]     [place] Ascites (abdomen Limited)  Result Date: 08/18/2018 CLINICAL DATA:  Cirrhosis. EXAM: LIMITED ABDOMEN ULTRASOUND FOR ASCITES TECHNIQUE: Limited ultrasound survey for ascites was performed in all four abdominal quadrants. COMPARISON:  None. FINDINGS: Survey of the abdominal cavity demonstrates no visible ascites. IMPRESSION: No ascites identified in the peritoneal cavity by ultrasound. Electronically Signed   By: Irish Lack M.D.   On: 08/18/2018 16:40    Review of Systems  Constitutional: Positive for diaphoresis and malaise/fatigue.  HENT: Positive for congestion.   Eyes: Negative.   Respiratory: Positive for shortness of breath.   Cardiovascular: Positive for orthopnea, leg swelling and PND.  Gastrointestinal: Negative.   Genitourinary: Negative.   Musculoskeletal: Positive for myalgias.  Skin:  Negative.   Neurological: Positive for weakness.  Endo/Heme/Allergies: Negative.   Psychiatric/Behavioral: Negative.    Blood pressure 116/85, pulse 91, temperature 98.1 F (36.7 C), temperature source Oral, resp. rate (!) 21, height  (1.854 m), weight 89.1 kg, SpO2 97 %. Physical Exam  Nursing note and vitals reviewed. Constitutional: He is oriented to person, place, and time. He appears well-developed and well-nourished.  HENT:  Head: Normocephalic and atraumatic.  Eyes: Pupils are equal, round, and reactive to light. Conjunctivae and EOM are normal.  Neck: Normal range of motion. Neck supple.  Cardiovascular: Normal rate and regular rhythm. Exam reveals gallop.  Murmur heard. Respiratory: Effort normal and breath sounds normal.  GI: Soft. Bowel sounds are normal.  Musculoskeletal:        General: Edema present.  Neurological: He is alert and oriented to person, place, and time. He has normal reflexes.  Skin: Skin is warm and dry.  Psychiatric: He has a normal mood and affect.    Assessment/Plan: Leg pain Leg swelling Edema Congestive heart failure Shortness of breath Hypertension Cirrhosis GERD Gout History of pancreatitis . Plan Agree with admission to telemetry Rule out for myocardial infarction Continue supplemental oxygen therapy as necessary Agree with diuretic intravenous therapy to help with fluid retention heart failure Continue therapy for cirrhosis of the liver Continue therapy for gout Agree with DVT prophylaxis hypertension control with amlodipine continue with consider switching to a different anti-hypertensive because of edema Maintain GERD therapy Agree with echocardiogram for further assessment  Eddis Pingleton D Vitor Overbaugh 08/19/2018, 12:08 AM

## 2018-08-19 NOTE — Progress Notes (Signed)
Pt had bed on high level. When educated about the risks about having his bed up and what his fall risk was, he said he would take responsibility if he fell. Will continue to monitor safety.

## 2018-08-19 NOTE — Progress Notes (Signed)
[employer] [facility] [place] [facility]   PATIENT NAME: Larry Andrade    MR#:  [id number]  DATE OF BIRTH:  December 12, 1960  SUBJECTIVE:  CHIEF COMPLAINT:   Chief Complaint  Patient presents with  . Shortness of Breath  . Leg Swelling  . Back Pain   Better swelling and pain in hands and feet, knees.   REVIEW OF SYSTEMS:  CONSTITUTIONAL: No fever, has fatigue or weakness.  EYES: No blurred or double vision.  EARS, NOSE, AND THROAT: No tinnitus or ear pain.  RESPIRATORY: No cough, shortness of breath, wheezing or hemoptysis.  CARDIOVASCULAR: No chest pain, orthopnea, edema.  GASTROINTESTINAL: No nausea, vomiting, diarrhea or abdominal pain.  GENITOURINARY: No dysuria, hematuria.  ENDOCRINE: No polyuria, nocturia,  HEMATOLOGY: No anemia, easy bruising or bleeding SKIN: No rash or lesion. MUSCULOSKELETAL: both foot and hands pain. NEUROLOGIC: No tingling, numbness, weakness.  PSYCHIATRY: No anxiety or depression.   ROS  DRUG ALLERGIES:   Allergies  Allergen Reactions  . Penicillins Rash    Has patient had a PCN reaction causing immediate rash, facial/tongue/throat swelling, SOB or lightheadedness with hypotension: No Has patient had a PCN reaction causing severe rash involving mucus membranes or skin necrosis: No Has patient had a PCN reaction that required hospitalization: No Has patient had a PCN reaction occurring within the last 10 years: No If all of the above answers are "NO", then may proceed with Cephalosporin use.     VITALS:  Blood pressure 117/81, pulse 91, temperature (!) 97.5 F (36.4 C), temperature source Oral, resp. rate 20, height 6\' 1"  (1.854 m), weight 91.9 kg, SpO2 94 %.  PHYSICAL EXAMINATION:  GENERAL:  58 y.o.-year-old patient lying in the bed with no acute distress.  EYES: Pupils equal, round, reactive to light and accommodation. No scleral icterus. Extraocular muscles intact.  HEENT: Head atraumatic, normocephalic. Oropharynx and  nasopharynx clear.  NECK:  Supple, no jugular venous distention. No thyroid enlargement, no tenderness.  LUNGS: Normal breath sounds bilaterally, no wheezing, some crepitation. No use of accessory muscles of respiration.  CARDIOVASCULAR: S1, S2 normal. No murmurs, rubs, or gallops.  ABDOMEN: Soft, mild tender, distended. Bowel sounds present. No organomegaly or mass.  EXTREMITIES: No cyanosis, or clubbing. Some deformed toes and fingers, significant swelling in both first metacarpophalangeal joints and metatarsophalangeal joints.  Swelling on both knees, mild tenderness. NEUROLOGIC: Cranial nerves II through XII are intact. Muscle strength 4/5 in all extremities. Sensation intact. Gait not checked.  PSYCHIATRIC: The patient is alert and oriented x 3.  SKIN: No obvious rash, lesion, or ulcer.   Physical Exam LABORATORY PANEL:   CBC Recent Labs  Lab 08/19/18 0410  WBC 13.4*  HGB 6.6*  HCT 19.3*  PLT 196   ------------------------------------------------------------------------------------------------------------------  Chemistries  Recent Labs  Lab 08/19/18 0410  NA 129*  K 4.6  CL 89*  CO2 27  GLUCOSE 245*  BUN 22*  CREATININE 1.27*  CALCIUM 7.0*  MG 1.8  AST 56*  ALT 15  ALKPHOS 259*  BILITOT 2.6*   ------------------------------------------------------------------------------------------------------------------  Cardiac Enzymes Recent Labs  Lab 08/16/18 1648 08/16/18 2213  TROPONINI 0.03* <0.03   ------------------------------------------------------------------------------------------------------------------  RADIOLOGY:  [place] Ascites (abdomen Limited)  Result Date: 08/18/2018 CLINICAL DATA:  Cirrhosis. EXAM: LIMITED ABDOMEN ULTRASOUND FOR ASCITES TECHNIQUE: Limited ultrasound survey for ascites was performed in all four abdominal quadrants. COMPARISON:  None. FINDINGS: Survey of the abdominal cavity demonstrates no visible ascites. IMPRESSION: No ascites  identified in the peritoneal cavity by ultrasound.  Electronically Signed   By: Irish Lack M.D.   On: 08/18/2018 16:40    ASSESSMENT AND PLAN:   Active Problems:   Acute on chronic systolic CHF (congestive heart failure) (HCC)  This is a 58 year old male admitted for CHF exacerbation. 1.  CHF: Acute on chronic systolic failure. Continue IV Lasix based on renal function. The patient likely has decreased oncotic pressure secondary to cirrhosis which will make diuresis more difficult. He does not have shortness of breath or hypoxia.   56tamsulosin to help with urinary flow as the patient reports that he sometimes has urinary hesitancy. IV Lasix was d/c due to worsening in the renal function and gave some albumin injections to help as he has very low albumin.  2.  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  The patient has not been drinking heavily for quite a long time.  Continue thiamine and folic acid. No ascites identified in the peritoneal cavity by ultrasound.  3.  Hypertension: Controlled; continue amlodipine. 4.  Gout attack:  appear to be an acute flare at this time  Added percocet for pain control. Colchicine and prednisone. Dr. Gavin Potters did drainage from the joint and sent to lab for cultures.  5.  Macrocytic anemia-this seems to be secondary to malnutrition due to alcohol abuse and he will also have history of colectomy so it could be malabsorption. S/p blood transfusion 1 unit but Hb 6.6, give one more unit PRBC, follow Dr. Lilli Few recommendation.  6.  GI prophylaxis: Pantoprazole per home regimen  Hyponatremia.  Follow-up BMP.  All the records are reviewed and case discussed with Care Management/Social Workerr. Management plans discussed with the patient, family and they are in agreement.  CODE STATUS: full.  TOTAL TIME TAKING CARE OF THIS PATIENT: 35 minutes.   POSSIBLE D/C IN 2 DAYS, DEPENDING ON CLINICAL CONDITION.   Shaune Pollack M.D on 08/19/2018    Between 7am to 6pm - Pager - [phone]  After 6pm go to [email or web address] - password [email or web address]  [facility]  Office  [phone]  CC: Primary care physician; System, Pcp Not In  Note: This dictation was prepared with Dragon dictation along with smaller phrase technology. Any transcriptional errors that result from this process are unintentional.

## 2018-08-19 NOTE — Consult Note (Signed)
PHARMACY CONSULT NOTE - FOLLOW UP  Pharmacy Consult for Electrolyte Monitoring and Replacement   Recent Labs: Potassium (mmol/L)  Date Value  08/19/2018 4.6   Magnesium (mg/dL)  Date Value  09/64/3838 1.8   Calcium (mg/dL)  Date Value  18/40/3754 7.0 (L)   Calcium, Total (PTH) (mg/dL)  Date Value  36/11/7701 9.4   Albumin (g/dL)  Date Value  40/35/2481 2.2 (L)   Phosphorus (mg/dL)  Date Value  85/90/9311 1.9 (L)   Sodium (mmol/L)  Date Value  08/19/2018 129 (L)   Correct Calcium: 8.44  Assessment: Pharmacy consulted for electrolyte monitoring and replacement in 58 yo male with PMH of alcohol abuse. Patient is admitted with acute CHF exacerbation.   Goal of Therapy:  Electrolytes WNL   Plan:  2/22 Phos: 1.9, K: 4.6, Mg: 1.8. Will replace with Sodium Phos IV x 1 dose. No additional replacement needed at this time.   Will recheck electrolytes with AM labs and continue to replace as needed.    Gardner Candle, PharmD, BCPS Clinical Pharmacist 08/19/2018 7:37 AM

## 2018-08-19 NOTE — Progress Notes (Signed)
PT Cancellation Note  Patient Details Name: Larry Andrade. MRN: [id number] DOB: [birth year]   Cancelled Treatment:    Reason Eval/Treat Not Completed: Patient not medically ready.  Hgb is still 6.6 so will ck later to see how pt is progressing.   [name] 08/19/2018, 8:10 AM   [name], PT MS Acute Rehab Dept. Number: [facility] [id number] and [facility] [phone]

## 2018-08-20 DIAGNOSIS — D72829 Elevated white blood cell count, unspecified: Secondary | ICD-10-CM

## 2018-08-20 LAB — BASIC METABOLIC PANEL
Anion gap: 10 (ref 5–15)
BUN: 24 mg/dL — ABNORMAL HIGH (ref 6–20)
CO2: 29 mmol/L (ref 22–32)
Calcium: 7 mg/dL — ABNORMAL LOW (ref 8.9–10.3)
Chloride: 92 mmol/L — ABNORMAL LOW (ref 98–111)
Creatinine, Ser: 1.05 mg/dL (ref 0.61–1.24)
GFR calc non Af Amer: >60 mL/min (ref >60)
Glucose, Bld: 123 mg/dL — ABNORMAL HIGH (ref 70–99)
Potassium: 4.2 mmol/L (ref 3.5–5.1)
Sodium: 131 mmol/L — ABNORMAL LOW (ref 135–145)

## 2018-08-20 LAB — PHOSPHORUS: Phosphorus: 1.7 mg/dL — ABNORMAL LOW (ref 2.5–4.6)

## 2018-08-20 LAB — CBC
HCT: 23.1 % — ABNORMAL LOW (ref 39.0–52.0)
Hemoglobin: 7.9 g/dL — ABNORMAL LOW (ref 13.0–17.0)
MCH: 34.2 pg — ABNORMAL HIGH (ref 26.0–34.0)
MCHC: 34.2 g/dL (ref 30.0–36.0)
MCV: 100 fL (ref 80.0–100.0)
Platelets: 244 10*3/uL (ref 150–400)
RBC: 2.31 MIL/uL — ABNORMAL LOW (ref 4.22–5.81)
RDW: 21.9 % — AB (ref 11.5–15.5)
WBC: 21.3 10*3/uL — ABNORMAL HIGH (ref 4.0–10.5)
nRBC: 0 % (ref 0.0–0.2)

## 2018-08-20 LAB — MAGNESIUM: Magnesium: 1.6 mg/dL — ABNORMAL LOW (ref 1.7–2.4)

## 2018-08-20 MED ORDER — SODIUM PHOSPHATES 45 MMOLE/15ML IV SOLN
25.0000 mmol | Freq: Once | INTRAVENOUS | Status: AC
  Administered 2018-08-20: 25 mmol via INTRAVENOUS
  Filled 2018-08-20: qty 8.33

## 2018-08-20 MED ORDER — MAGNESIUM SULFATE 2 GM/50ML IV SOLN
2.0000 g | Freq: Once | INTRAVENOUS | Status: AC
  Administered 2018-08-20: 2 g via INTRAVENOUS
  Filled 2018-08-20: qty 50

## 2018-08-20 MED ORDER — NICOTINE 14 MG/24HR TD PT24
14.0000 mg | MEDICATED_PATCH | Freq: Every day | TRANSDERMAL | Status: DC
  Administered 2018-08-20 – 2018-08-21 (×2): 14 mg via TRANSDERMAL
  Filled 2018-08-20 (×2): qty 1

## 2018-08-20 MED ORDER — GUAIFENESIN-DM 100-10 MG/5ML PO SYRP
5.0000 mL | ORAL_SOLUTION | ORAL | Status: DC | PRN
  Administered 2018-08-20 – 2018-08-21 (×5): 5 mL via ORAL
  Filled 2018-08-20 (×5): qty 5

## 2018-08-20 MED ORDER — FUROSEMIDE 10 MG/ML IJ SOLN
20.0000 mg | Freq: Every day | INTRAMUSCULAR | Status: DC
  Administered 2018-08-20: 11:00:00 20 mg via INTRAVENOUS
  Filled 2018-08-20: qty 2

## 2018-08-20 NOTE — Progress Notes (Signed)
[employer] [facility] [place] [facility]   PATIENT NAME: Larry Andrade    MR#:  [id number]  DATE OF BIRTH:  August 21, 1960  SUBJECTIVE:  CHIEF COMPLAINT:   Chief Complaint  Patient presents with  . Shortness of Breath  . Leg Swelling  . Back Pain   Better swelling and pain in hands and feet, knees. REVIEW OF SYSTEMS:  CONSTITUTIONAL: No fever, has fatigue or weakness.  EYES: No blurred or double vision.  EARS, NOSE, AND THROAT: No tinnitus or ear pain.  RESPIRATORY: No cough, shortness of breath, wheezing or hemoptysis.  CARDIOVASCULAR: No chest pain, orthopnea, edema.  GASTROINTESTINAL: No nausea, vomiting, diarrhea or abdominal pain.  GENITOURINARY: No dysuria, hematuria.  ENDOCRINE: No polyuria, nocturia,  HEMATOLOGY: No anemia, easy bruising or bleeding SKIN: No rash or lesion. MUSCULOSKELETAL: both foot and hands pain. NEUROLOGIC: No tingling, numbness, weakness.  PSYCHIATRY: No anxiety or depression.   ROS  DRUG ALLERGIES:   Allergies  Allergen Reactions  . Penicillins Rash    Has patient had a PCN reaction causing immediate rash, facial/tongue/throat swelling, SOB or lightheadedness with hypotension: No Has patient had a PCN reaction causing severe rash involving mucus membranes or skin necrosis: No Has patient had a PCN reaction that required hospitalization: No Has patient had a PCN reaction occurring within the last 10 years: No If all of the above answers are "NO", then may proceed with Cephalosporin use.     VITALS:  Blood pressure (!) 139/96, pulse (!) 104, temperature 98 F (36.7 C), temperature source Oral, resp. rate 18, height 6\' 1"  (1.854 m), weight 91.9 kg, SpO2 94 %.  PHYSICAL EXAMINATION:  GENERAL:  58 y.o.-year-old patient lying in the bed with no acute distress.  EYES: Pupils equal, round, reactive to light and accommodation. No scleral icterus. Extraocular muscles intact.  HEENT: Head atraumatic, normocephalic. Oropharynx and  nasopharynx clear.  NECK:  Supple, no jugular venous distention. No thyroid enlargement, no tenderness.  LUNGS: Normal breath sounds bilaterally, no wheezing, some crepitation. No use of accessory muscles of respiration.  CARDIOVASCULAR: S1, S2 normal. No murmurs, rubs, or gallops.  ABDOMEN: Soft, mild tender, distended. Bowel sounds present. No organomegaly or mass.  EXTREMITIES: No cyanosis, or clubbing. Some deformed toes and fingers, better swelling in both first metacarpophalangeal joints and metatarsophalangeal joints.  Better swelling on both knees, mild tenderness. Bilateral leg edema 1+. NEUROLOGIC: Cranial nerves II through XII are intact. Muscle strength 4/5 in all extremities. Sensation intact. Gait not checked.  PSYCHIATRIC: The patient is alert and oriented x 3.  SKIN: No obvious rash, lesion, or ulcer.   Physical Exam LABORATORY PANEL:   CBC Recent Labs  Lab 08/20/18 0454  WBC 21.3*  HGB 7.9*  HCT 23.1*  PLT 244   ------------------------------------------------------------------------------------------------------------------  Chemistries  Recent Labs  Lab 08/19/18 0410 08/20/18 0454  NA 129* 131*  K 4.6 4.2  CL 89* 92*  CO2 27 29  GLUCOSE 245* 123*  BUN 22* 24*  CREATININE 1.27* 1.05  CALCIUM 7.0* 7.0*  MG 1.8 1.6*  AST 56*  --   ALT 15  --   ALKPHOS 259*  --   BILITOT 2.6*  --    ------------------------------------------------------------------------------------------------------------------  Cardiac Enzymes Recent Labs  Lab 08/16/18 1648 08/16/18 2213  TROPONINI 0.03* <0.03   ------------------------------------------------------------------------------------------------------------------  RADIOLOGY:  [place] Ascites (abdomen Limited)  Result Date: 08/18/2018 CLINICAL DATA:  Cirrhosis. EXAM: LIMITED ABDOMEN ULTRASOUND FOR ASCITES TECHNIQUE: Limited ultrasound survey for ascites was performed in all  four abdominal quadrants. COMPARISON:  None.  FINDINGS: Survey of the abdominal cavity demonstrates no visible ascites. IMPRESSION: No ascites identified in the peritoneal cavity by ultrasound. Electronically Signed   By: Irish Lack M.D.   On: 08/18/2018 16:40    ASSESSMENT AND PLAN:   Active Problems:   Acute on chronic systolic CHF (congestive heart failure) (HCC)  This is a 58 year old male admitted for CHF exacerbation. 1.  CHF: Acute on chronic systolic failure. Continue IV Lasix based on renal function. The patient likely has decreased oncotic pressure secondary to cirrhosis which will make diuresis more difficult. He does not have shortness of breath or hypoxia.   tamsulosin to help with urinary flow as the patient reports that he sometimes has urinary hesitancy. IV Lasix was d/c due to worsening in the renal function and gave some albumin injections to help as he has very low albumin. Resume lasix due to better renal function.  Acute renal failure due to dehydration.  Better.  2.  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  The patient has not been drinking heavily for quite a long time.  Continue thiamine and folic acid. No ascites identified in the peritoneal cavity by ultrasound.  3.  Hypertension: Controlled; continue amlodipine. 4.  Gout attack:  appear to be an acute flare at this time  Added percocet for pain control. Colchicine and prednisone. Dr. Gavin Potters did drainage from the joint and sent to lab for cultures.  5.  Macrocytic anemia-this seems to be secondary to malnutrition due to alcohol abuse and he will also have history of colectomy so it could be malabsorption. S/p blood transfusion 2 unit PRBC, Hb up to 7.9. follow Dr. Lilli Few recommendation.  6.  GI prophylaxis: Pantoprazole per home regimen  Hyponatremia.  Better, follow-up BMP. Hypomagnesemia.  Magnesium supplement. Hypophosphatemia.  Supplement. Leukocytosis, possible due to steroid.  Follow-up CBC.  All the records are reviewed  and case discussed with Care Management/Social Workerr. Management plans discussed with the patient, family and they are in agreement.  CODE STATUS: full.  TOTAL TIME TAKING CARE OF THIS PATIENT: 33 minutes.   POSSIBLE D/C IN 2 DAYS, DEPENDING ON CLINICAL CONDITION.   Shaune Pollack M.D on 08/20/2018   Between 7am to 6pm - Pager - [phone]  After 6pm go to [email or web address] - password [email or web address]  [facility]  Office  [phone]  CC: Primary care physician; System, Pcp Not In  Note: This dictation was prepared with Dragon dictation along with smaller phrase technology. Any transcriptional errors that result from this process are unintentional.

## 2018-08-20 NOTE — Progress Notes (Signed)
[facility] Hematology/Oncology Progress Note  Date of admission: 08/16/2018  Hospital day:  08/20/2018  Chief Complaint: Larry Flagg. is a 58 y.o. male who was admitted from the emergency room with a CHF exacerbation.  Subjective:  Feels about the same.  Still hurts all over in joints.  Getting into bedside chair.  Needs social work assistance.  Social History: The patient is alone today.  Allergies:  Allergies  Allergen Reactions  . Penicillins Rash    Has patient had a PCN reaction causing immediate rash, facial/tongue/throat swelling, SOB or lightheadedness with hypotension: No Has patient had a PCN reaction causing severe rash involving mucus membranes or skin necrosis: No Has patient had a PCN reaction that required hospitalization: No Has patient had a PCN reaction occurring within the last 10 years: No If all of the above answers are "NO", then may proceed with Cephalosporin use.     Scheduled Medications: . colchicine  0.6 mg Oral Daily  . cyanocobalamin  1,000 mcg Intramuscular Once  . docusate sodium  100 mg Oral BID  . folic acid  1 mg Oral Daily  . furosemide  20 mg Intravenous Daily  . pantoprazole  40 mg Oral Daily  . predniSONE  50 mg Oral Q breakfast  . tamsulosin  0.4 mg Oral Daily  . vitamin B-12  1,000 mcg Oral Daily    Review of Systems: GENERAL:  General fatigue.  No fevers, sweats or weight loss. PERFORMANCE STATUS (ECOG):  1-2 HEENT:  No visual changes, runny nose, sore throat, mouth sores or tenderness. Lungs: No shortness of breath.  Cough.  No hemoptysis. Cardiac:  No chest pain, palpitations, orthopnea, or PND. GI:  No nausea, vomiting, diarrhea, constipation, melena or hematochezia. GU:  No urgency, frequency, dysuria, or hematuria. Musculoskeletal:  Back pain.  Joint pain.  Gout in hands, elbow and feet.  No muscle tenderness. Extremities:  Leg pain and sswelling. Skin:  No rashes or skin changes. Neuro:  Feet numb.   No headache, numbness or weakness, balance or coordination issues. Endocrine:  No diabetes, thyroid issues, hot flashes or night sweats. Psych:  No mood changes, depression or anxiety. Pain:  Hands and bilateral leg pain.  Pain medications take the edge off. Review of systems:  All other systems reviewed and found to be negative.  Physical Exam: Blood pressure (!) 139/96, pulse (!) 104, temperature 98 F (36.7 C), temperature source Oral, resp. rate 18, height  (1.854 m), weight 202 lb 9.6 oz (91.9 kg), SpO2 94 %.  GENERAL: Fatigued appearing gentleman sitting comfortably on the medical unit in no acute distress. MENTAL STATUS:  Alert and oriented to person, place and time. HEAD:  Larry hair and beard.  Normocephalic, atraumatic, face symmetric, no Cushingoid features. EYES:   Larry eyes.  Pupils equal round and reactive to light and accomodation.  No conjunctivitis or scleral icterus. ENT:  Oropharynx clear without lesion.  Tongue normal. Poor dentition.  Mucous membranes moist.  RESPIRATORY:  Clear to auscultation without rales, wheezes or rhonchi. CARDIOVASCULAR:  Regular rate and rhythm without murmur, rub or gallop. ABDOMEN:  Fully round.  Reducible umbilical hernia.  Soft, non-tender, with active bowel sounds, and no appreciable hepatosplenomegaly.  No masses. SKIN:  Upper extremity scarring and small ecchymosis.  No rashes, ulcers or lesions. EXTREMITIES: Thin arms.  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  Bilateral 2+ pitting lower extremity edema.  No palpable cords. NEUROLOGICAL: Unremarkable. PSYCH:  Appropriate.  Results for orders placed or performed during the hospital encounter of 08/16/18 (from the past 48 hour(s))  Prepare RBC     Status: None   Collection Time: 08/18/18  9:30 AM  Result Value Ref Range   Order Confirmation      ORDER PROCESSED BY BLOOD BANK Performed at [facility], [address] [place], [place]  [address]   Type and screen [facility]     Status: None (Preliminary result)   Collection Time: 08/18/18  9:53 AM  Result Value Ref Range   ABO/RH(D) AB POS    Antibody Screen NEG    Sample Expiration 08/21/2018    Unit Number [id number]    Blood Component Type RED CELLS,LR    Unit division 00    Status of Unit ISSUED,FINAL    Transfusion Status OK TO TRANSFUSE    Crossmatch Result Compatible    Unit Number [id number]    Blood Component Type RED CELLS,LR    Unit division 00    Status of Unit ISSUED    Transfusion Status OK TO TRANSFUSE    Crossmatch Result      Compatible Performed at [facility], [address] [place], [place] [address]   Lactate dehydrogenase     Status: Abnormal   Collection Time: 08/18/18  9:57 AM  Result Value Ref Range   LDH 208 (H) 98 - 192 U/L    Comment: Performed at [facility], [address] [place], [place] [address]  Synovial cell count + diff, w/ crystals     Status: Abnormal   Collection Time: 08/18/18  4:18 PM  Result Value Ref Range   Color, Synovial YELLOW (A) YELLOW   Appearance-Synovial TURBID (A) CLEAR   Crystals, Fluid EXTRACELLULAR MONOSODIUM URATE CRYSTALS    WBC, Synovial 2,836 (H) 0 - 200 /cu mm   Neutrophil, Synovial 79 %   Lymphocytes-Synovial Fld 11 %   Monocyte-Macrophage-Synovial Fluid 10 %   Eosinophils-Synovial 0 %    Comment: Performed at [facility], [address], [place], [place] [address]  Body fluid culture     Status: None (Preliminary result)   Collection Time: 08/18/18  4:18 PM  Result Value Ref Range   Specimen Description      KNEE Performed at [facility], [address], [place], [place] [address]    Special Requests      JOINT FLUID Performed at [facility], [address] [place], [place] [address]    Gram Stain      RARE WBC PRESENT, PREDOMINANTLY PMN NO ORGANISMS SEEN Performed at [facility], [address] [place], [place] [address]    Culture NO GROWTH < 24 HOURS    Report Status PENDING   Pathologist smear review     Status: None   Collection Time: 08/18/18  4:18 PM  Result Value Ref Range   Path Review Peripheral blood smear is reviewed.     Comment: Normal leukocyte count and differential. Macrocytic anemia. Normal platelet count and morphology. Reviewed by Beryle Quant, M.D. Performed at [facility], [address] [place], [place] [address]   Phosphorus     Status: Abnormal   Collection Time: 08/19/18  4:10 AM  Result Value Ref Range   Phosphorus 1.9 (L) 2.5 - 4.6 mg/dL    Comment: Performed at [facility], [address] [place], [place] [address]  Magnesium     Status: None  Collection Time: 08/19/18  4:10 AM  Result Value Ref Range   Magnesium 1.8 1.7 - 2.4 mg/dL    Comment: Performed at [facility], [address] [place], [place] [address]  CBC     Status: Abnormal   Collection Time: 08/19/18  4:10 AM  Result Value Ref Range   WBC 13.4 (H) 4.0 - 10.5 K/uL   RBC 1.88 (L) 4.22 - 5.81 MIL/uL   Hemoglobin 6.6 (L) 13.0 - 17.0 g/dL   HCT 60.4 (L) 54.0 - 98.1 %   MCV 102.7 (H) 80.0 - 100.0 fL   MCH 35.1 (H) 26.0 - 34.0 pg   MCHC 34.2 30.0 - 36.0 g/dL   RDW 19.1 (H) 47.8 - 29.5 %   Platelets 196 150 - 400 K/uL   nRBC 0.0 0.0 - 0.2 %    Comment: Performed at [facility], [address] [place], [place] [address]  Comprehensive metabolic panel     Status: Abnormal   Collection Time: 08/19/18  4:10 AM  Result Value Ref Range   Sodium 129 (L) 135 - 145 mmol/L   Potassium 4.6 3.5 - 5.1 mmol/L   Chloride 89 (L) 98 - 111 mmol/L   CO2 27 22 - 32 mmol/L   Glucose, Bld 245 (H) 70 - 99 mg/dL   BUN 22 (H) 6 - 20 mg/dL   Creatinine, Ser 8.65 (H) 0.61 - 1.24 mg/dL   Calcium 7.0 (L) 8.9 - 10.3 mg/dL   Total Protein 6.0 (L) 6.5 - 8.1 g/dL   Albumin 2.2 (L) 3.5 - 5.0 g/dL   AST 56 (H) 15 - 41 U/L   ALT 15 0 - 44 U/L   Alkaline  Phosphatase 259 (H) 38 - 126 U/L   Total Bilirubin 2.6 (H) 0.3 - 1.2 mg/dL   GFR calc non Af Amer >60 >60 mL/min   GFR calc Af Amer >60 >60 mL/min   Anion gap 13 5 - 15    Comment: Performed at [facility], [address], [place], [place] [address]  Uric acid     Status: Abnormal   Collection Time: 08/19/18  4:10 AM  Result Value Ref Range   Uric Acid, Serum 11.0 (H) 3.7 - 8.6 mg/dL    Comment: Performed at [facility], [address] [place], [place] [address]  T4, free     Status: None   Collection Time: 08/19/18  4:10 AM  Result Value Ref Range   Free T4 0.89 0.82 - 1.77 ng/dL    Comment: (NOTE) Biotin ingestion may interfere with free T4 tests. If the results are inconsistent with the TSH level, previous test results, or the clinical presentation, then consider biotin interference. If needed, order repeat testing after stopping biotin. Performed at [facility], [address] [place], [place] [address]   Reticulocytes     Status: Abnormal   Collection Time: 08/19/18  4:10 AM  Result Value Ref Range   Retic Ct Pct 1.2 0.4 - 3.1 %   RBC. 1.88 (L) 4.22 - 5.81 MIL/uL   Retic Count, Absolute 22.9 19.0 - 186.0 K/uL   Immature Retic Fract 15.8 2.3 - 15.9 %    Comment: Performed at [facility], [address] [place], [place] [address]  TSH     Status: None   Collection Time: 08/19/18  4:10 AM  Result Value Ref Range   TSH 2.912 0.350 - 4.500 uIU/mL    Comment: Performed by a  3rd Generation assay with a functional sensitivity of <=0.01 uIU/mL. Performed at [facility], [address] [place], [place] [address]   Ferritin     Status: Abnormal   Collection Time: 08/19/18  4:10 AM  Result Value Ref Range   Ferritin 1,003 (H) 24 - 336 ng/mL    Comment: Performed at [facility], [address] [place], [place] [address]  Iron and TIBC     Status: None   Collection Time: 08/19/18  4:10 AM  Result Value Ref  Range   Iron 88 45 - 182 ug/dL   TIBC NOT CALCULATED 324 - 450 ug/dL   Saturation Ratios NOT CALCULATED 17.9 - 39.5 %   UIBC NOT CALCULATED ug/dL    Comment: Performed at [facility], [address] [place], [place] [address]  Prepare RBC     Status: None   Collection Time: 08/19/18  7:18 AM  Result Value Ref Range   Order Confirmation      ORDER PROCESSED BY BLOOD BANK Performed at [facility], [address] [place], [place] [address]   Basic metabolic panel     Status: Abnormal   Collection Time: 08/20/18  4:54 AM  Result Value Ref Range   Sodium 131 (L) 135 - 145 mmol/L   Potassium 4.2 3.5 - 5.1 mmol/L   Chloride 92 (L) 98 - 111 mmol/L   CO2 29 22 - 32 mmol/L   Glucose, Bld 123 (H) 70 - 99 mg/dL   BUN 24 (H) 6 - 20 mg/dL   Creatinine, Ser 6.44 0.61 - 1.24 mg/dL   Calcium 7.0 (L) 8.9 - 10.3 mg/dL   GFR calc non Af Amer >60 >60 mL/min   GFR calc Af Amer >60 >60 mL/min   Anion gap 10 5 - 15    Comment: Performed at [facility], [address] [place], [place] [address]  CBC     Status: Abnormal   Collection Time: 08/20/18  4:54 AM  Result Value Ref Range   WBC 21.3 (H) 4.0 - 10.5 K/uL   RBC 2.31 (L) 4.22 - 5.81 MIL/uL   Hemoglobin 7.9 (L) 13.0 - 17.0 g/dL   HCT 25.9 (L) 56.3 - 87.5 %   MCV 100.0 80.0 - 100.0 fL   MCH 34.2 (H) 26.0 - 34.0 pg   MCHC 34.2 30.0 - 36.0 g/dL   RDW 64.3 (H) 32.9 - 51.8 %   Platelets 244 150 - 400 K/uL   nRBC 0.0 0.0 - 0.2 %    Comment: Performed at [facility], [address] [place], [place] [address]  Phosphorus     Status: Abnormal   Collection Time: 08/20/18  4:54 AM  Result Value Ref Range   Phosphorus 1.7 (L) 2.5 - 4.6 mg/dL    Comment: Performed at [facility], [address] [place], [place] [address]  Magnesium     Status: Abnormal   Collection Time: 08/20/18  4:54 AM  Result Value Ref Range   Magnesium 1.6 (L) 1.7 - 2.4 mg/dL    Comment: Performed at [facility], [address] [place], [place] [address]   [place] Ascites (abdomen Limited)  Result Date: 08/18/2018 CLINICAL DATA:  Cirrhosis. EXAM: LIMITED ABDOMEN ULTRASOUND FOR ASCITES TECHNIQUE: Limited ultrasound survey for ascites was performed in all four abdominal quadrants. COMPARISON:  None. FINDINGS: Survey of the abdominal cavity demonstrates no visible ascites. IMPRESSION: No ascites identified in the peritoneal cavity by ultrasound.  Electronically Signed   By: Irish LackGlenn  Yamagata M.D.   On: 08/18/2018 16:40    Assessment:  Larry HumanJohn Heck Jr. is a 58 y.o. male with a history of cirrhosis secondary to alcohol use.  He drinks alcohol daily.  He notes a history of transfusion years ago and a colectomy for a mass (no records available) 5-6 years ago.  He has had macrocytic RBC indices since at least 01/2017.  CBC on admission included a hematocrit of 22.7, hemoglobin 7.6, MCV 109.1, platelets 338,000, WBC 18,800.  CBC today reveals a hematocrit of 18.9, hemoglobin 6.4, MCV 108.6, platelets 200,000, WBC 10,500.  LDH was 208 (98-192). Creatinine was 1.5.  Albumen was 1.7.  Protein was 5.4.  AST was 68, ALT 16, alkaline phosphatase 300, and bilirubin 3.1.  B12 was 1096.  Folate was 5.1 (low).  Bilirubin 4.3 (direct 2.5) on 08/17/2018.  Urinalysis revealed no bilirubin or hematuria.  Ferritin was 1003. TSH and free T4 were normal.  Retic was 1.2%.   Peripheral smear revealed a macrocytic anemia, normal leukocyte and differential, and normal platelet count and morphology.  Patient has received 2 units of PRBCs (08/18/2018 and 08/19/2018).  Symptomatically, he feels about the same.  Exam reveals significant lower extremity edema and gout.  Plan: 1.  Macrocytic anemia             Hemoglobin has improved from 6.4 to 7.9 after 2 units of PRBCs.  Etiology of macrocytosis is likely due to underlying liver disease and folate deficiency.             B12 is normal.             Folate is low.   He is on folic acid  1 mg/day.   Check folic acid in 1 month.  If folate not normal, increase folic acid to 2 mg/day.             Ferritin is elevated.     Ferritin is 1003.   Suspect acute phase reactant.   Iron saturation not calculated.   Prior iron saturation was 25% on 02/23/2018.   Doubt hemochromatosis.   Would recheck ferritin and iron saturation in outpatient department to ensure normalization.  No evidence of hemolysis   LDH was low (208).   Retic was low (1.2%) for level of anemia.             Additional labs:   Normal: TSH, free T4.  Hepatitis B and C serologies pending.             Guaiac all stools.             Anticipate follow-up with GI/hepatology as outpatient to assess liver disease.   Liver imaging and AFP every 6 months 2.  Leukocytosis  WBC has increased from 13,400 to 21,300 over night.  Etiology unclear (? steroids).   Prednisone 50 mg a day was started on 08/18/2018.   He received steroid injections in his knees on 08/18/2018.  No fever or localizing source on exam.  3.  Disposition  Patient notes inability to pay for medications as an outpatient.  Patient needs follow-up with social work.  Anticipate follow-up in the hematology clinic after discharge.   Rosey BathMelissa C , MD  08/20/2018, 9:03 AM

## 2018-08-20 NOTE — Evaluation (Signed)
Physical Therapy Evaluation Patient Details Name: Larry Andrade. MRN: [id number] DOB: February 15, 1961 Today's Date: 08/20/2018   History of Present Illness  58 yo male with onset of CHF exacerbation and gouty attack of hands was admitted for treatment, was transfused for anemia.  PMHx:  ascites, cirrhosis, gout, macrocytic anemia, CHF,   Clinical Impression  Pt was seen for mobility after determining he was able to walk with PT.  Pt is up to walk with PT and note touch contact on IV pole is enough to get him across the hallway and then onto steps with rails only.  He will continue PT in acute setting but should not require follow up at home.  See acutely to increase endurance and control the amount of fall risk pt demonstrates.    Follow Up Recommendations No PT follow up    Equipment Recommendations  None recommended by PT    Recommendations for Other Services       Precautions / Restrictions Precautions Precautions: Fall Restrictions Weight Bearing Restrictions: No      Mobility  Bed Mobility Overal bed mobility: Modified Independent                Transfers Overall transfer level: Modified independent Equipment used: None             General transfer comment: remembers to use arm rests to sit safely  Ambulation/Gait Ambulation/Gait assistance: Min guard Gait Distance (Feet): 300 Feet Assistive device: IV Pole Gait Pattern/deviations: Step-through pattern;Decreased stride length;Wide base of support Gait velocity: reduced Gait velocity interpretation: <1.31 ft/sec, indicative of household ambulator General Gait Details: hallway walkwith no LOB  Stairs Stairs: Yes Stairs assistance: Min guard Stair Management: Two rails;Step to pattern;Alternating pattern;Forwards Number of Stairs: 2 General stair comments: pt demonstrated ability to manage stairs with turn around on them  Wheelchair Mobility    Modified Rankin (Stroke Patients Only)       Balance  Overall balance assessment: Mild deficits observed, not formally tested                                           Pertinent Vitals/Pain Pain Assessment: No/denies pain    Home Living Family/patient expects to be discharged to:: Private residence Living Arrangements: Spouse/significant other Available Help at Discharge: Family;Available PRN/intermittently Type of Home: House Home Access: Stairs to enter Entrance Stairs-Rails: Right;Left;Can reach both Entrance Stairs-Number of Steps: 2 Home Layout: One level Home Equipment: Walker - 2 wheels Additional Comments: pt has been safely walking at home    Prior Function Level of Independence: Independent with assistive device(s)               Hand Dominance   Dominant Hand: Right    Extremity/Trunk Assessment   Upper Extremity Assessment Upper Extremity Assessment: Overall WFL for tasks assessed    Lower Extremity Assessment Lower Extremity Assessment: Overall WFL for tasks assessed    Cervical / Trunk Assessment Cervical / Trunk Assessment: Normal  Communication   Communication: No difficulties  Cognition Arousal/Alertness: Awake/alert Behavior During Therapy: WFL for tasks assessed/performed Overall Cognitive Status: Within Functional Limits for tasks assessed                                        General Comments General comments (skin integrity,  edema, etc.): pt is able to walk around on the hall, has a walker but just used the IV pole as a touch support, had to bring it since he is on IV    Exercises     Assessment/Plan    PT Assessment Patient needs continued PT services  PT Problem List Decreased strength;Decreased range of motion;Decreased activity tolerance;Decreased balance;Decreased mobility;Decreased coordination;Decreased cognition;Decreased knowledge of use of DME;Decreased safety awareness;Decreased knowledge of precautions       PT Treatment Interventions DME  instruction;Gait training;Functional mobility training;Therapeutic activities;Therapeutic exercise;Balance training;Neuromuscular re-education;Patient/family education;Stair training    PT Goals (Current goals can be found in the Care Plan section)  Acute Rehab PT Goals Patient Stated Goal: to get to go outside to smoke PT Goal Formulation: With patient Time For Goal Achievement: 09/03/18 Potential to Achieve Goals: Good    Frequency Min 2X/week   Barriers to discharge Decreased caregiver support;Inaccessible home environment stairs with family available sporadically    Co-evaluation               AM-PAC PT "6 Clicks" Mobility  Outcome Measure Help needed turning from your back to your side while in a flat bed without using bedrails?: None Help needed moving from lying on your back to sitting on the side of a flat bed without using bedrails?: None Help needed moving to and from a bed to a chair (including a wheelchair)?: A Little Help needed standing up from a chair using your arms (e.g., wheelchair or bedside chair)?: A Little Help needed to walk in hospital room?: A Little Help needed climbing 3-5 steps with a railing? : A Little 6 Click Score: 20    End of Session Equipment Utilized During Treatment: Gait belt Activity Tolerance: Patient tolerated treatment well;Other (comment)(walked with minor supervised to min guard assist) Patient left: with call bell/phone within reach;in bed;with bed alarm set Nurse Communication: Mobility status PT Visit Diagnosis: Unsteadiness on feet (R26.81);Other abnormalities of gait and mobility (R26.89);Difficulty in walking, not elsewhere classified (R26.2)    Time: 2924-4628 PT Time Calculation (min) (ACUTE ONLY): 25 min   Charges:   PT Evaluation $PT Eval Moderate Complexity: 1 Mod PT Treatments $Gait Training: 8-22 mins       [name] 08/20/2018, 3:07 PM  [name], PT MS Acute Rehab Dept. Number: [facility] [id number] and [facility]  [phone]

## 2018-08-20 NOTE — Consult Note (Signed)
PHARMACY CONSULT NOTE - FOLLOW UP  Pharmacy Consult for Electrolyte Monitoring and Replacement   Recent Labs: Potassium (mmol/L)  Date Value  08/20/2018 4.2   Magnesium (mg/dL)  Date Value  40/98/1191 1.6 (L)   Calcium (mg/dL)  Date Value  47/82/9562 7.0 (L)   Calcium, Total (PTH) (mg/dL)  Date Value  13/01/6577 9.4   Albumin (g/dL)  Date Value  46/96/2952 2.2 (L)   Phosphorus (mg/dL)  Date Value  84/13/2440 1.7 (L)   Sodium (mmol/L)  Date Value  08/20/2018 131 (L)   Correct Calcium: 8.44  Assessment: Pharmacy consulted for electrolyte monitoring and replacement in 58 yo male with PMH of alcohol abuse. Patient is admitted with acute CHF exacerbation.   Goal of Therapy:  Electrolytes WNL   Plan:  2/22 Phos: 1.7, K: 4.26, Mg: 1.6. Will replace with Sodium Phos IV x 1 dose and Mag 2g IV x 1 dose. No additional replacement needed at this time.   Will recheck electrolytes with AM labs and continue to replace as needed.    Gardner Candle, PharmD, BCPS Clinical Pharmacist 08/20/2018 7:34 AM

## 2018-08-21 LAB — TYPE AND SCREEN
ABO/RH(D): AB POS
Antibody Screen: NEGATIVE
UNIT DIVISION: 0
Unit division: 0

## 2018-08-21 LAB — BASIC METABOLIC PANEL
ANION GAP: 10 (ref 5–15)
BUN: 24 mg/dL — ABNORMAL HIGH (ref 6–20)
CO2: 29 mmol/L (ref 22–32)
Calcium: 7.4 mg/dL — ABNORMAL LOW (ref 8.9–10.3)
Chloride: 92 mmol/L — ABNORMAL LOW (ref 98–111)
Creatinine, Ser: 1.2 mg/dL (ref 0.61–1.24)
GFR calc Af Amer: >60 mL/min (ref >60)
GFR calc non Af Amer: >60 mL/min (ref >60)
Glucose, Bld: 111 mg/dL — ABNORMAL HIGH (ref 70–99)
POTASSIUM: 4.2 mmol/L (ref 3.5–5.1)
Sodium: 131 mmol/L — ABNORMAL LOW (ref 135–145)

## 2018-08-21 LAB — PHOSPHORUS: Phosphorus: 2.4 mg/dL — ABNORMAL LOW (ref 2.5–4.6)

## 2018-08-21 LAB — CBC
HCT: 24.5 % — ABNORMAL LOW (ref 39.0–52.0)
Hemoglobin: 8.3 g/dL — ABNORMAL LOW (ref 13.0–17.0)
MCH: 33.9 pg (ref 26.0–34.0)
MCHC: 33.9 g/dL (ref 30.0–36.0)
MCV: 100 fL (ref 80.0–100.0)
Platelets: 272 10*3/uL (ref 150–400)
RBC: 2.45 MIL/uL — ABNORMAL LOW (ref 4.22–5.81)
RDW: 21.1 % — ABNORMAL HIGH (ref 11.5–15.5)
WBC: 20.7 10*3/uL — AB (ref 4.0–10.5)
nRBC: 0 % (ref 0.0–0.2)

## 2018-08-21 LAB — BPAM RBC
BLOOD PRODUCT EXPIRATION DATE: 202003132359
Blood Product Expiration Date: 202003152359
ISSUE DATE / TIME: 202002221532
ISSUE DATE / TIME: 202002211359
UNIT TYPE AND RH: 7300
Unit Type and Rh: 7300

## 2018-08-21 LAB — LEAD, BLOOD (ADULT >= 16 YRS): LEAD-WHOLE BLOOD: 4 ug/dL (ref 0–4)

## 2018-08-21 LAB — HEPATITIS C ANTIBODY: HCV Ab: <0.1 s/co ratio (ref 0.0–0.9)

## 2018-08-21 LAB — HEPATITIS B CORE ANTIBODY, TOTAL: Hep B Core Total Ab: NEGATIVE

## 2018-08-21 LAB — HEPATITIS B SURFACE ANTIGEN: Hepatitis B Surface Ag: NEGATIVE

## 2018-08-21 LAB — MAGNESIUM: Magnesium: 1.8 mg/dL (ref 1.7–2.4)

## 2018-08-21 MED ORDER — TAMSULOSIN HCL 0.4 MG PO CAPS: 0.4000 mg | ORAL_CAPSULE | Freq: Every day | ORAL | 1 refills | Status: DC

## 2018-08-21 MED ORDER — COLCHICINE 0.6 MG PO TABS: 0.6000 mg | ORAL_TABLET | Freq: Every day | ORAL | 0 refills | Status: DC

## 2018-08-21 MED ORDER — FUROSEMIDE 20 MG PO TABS
20.0000 mg | ORAL_TABLET | Freq: Two times a day (BID) | ORAL | Status: DC
  Administered 2018-08-21: 11:00:00 20 mg via ORAL
  Filled 2018-08-21: qty 1

## 2018-08-21 MED ORDER — PREDNISONE 50 MG PO TABS: 50.0000 mg | ORAL_TABLET | Freq: Every day | ORAL | 0 refills | Status: DC

## 2018-08-21 MED ORDER — K PHOS MONO-SOD PHOS DI & MONO 155-852-130 MG PO TABS
500.0000 mg | ORAL_TABLET | ORAL | Status: DC
  Administered 2018-08-21: 500 mg via ORAL
  Filled 2018-08-21 (×2): qty 2

## 2018-08-21 MED ORDER — GUAIFENESIN-DM 100-10 MG/5ML PO SYRP: 5.0000 mL | ORAL_SOLUTION | ORAL | 0 refills | Status: DC | PRN

## 2018-08-21 MED ORDER — FOLIC ACID 1 MG PO TABS: 1.0000 mg | ORAL_TABLET | Freq: Every day | ORAL | 0 refills | Status: DC

## 2018-08-21 MED ORDER — CYANOCOBALAMIN 1000 MCG PO TABS: 1000.0000 ug | ORAL_TABLET | Freq: Every day | ORAL | 0 refills | Status: DC

## 2018-08-21 MED ORDER — FUROSEMIDE 20 MG PO TABS: 20.0000 mg | ORAL_TABLET | Freq: Two times a day (BID) | ORAL | 1 refills | Status: DC

## 2018-08-21 NOTE — Care Management Note (Signed)
Case Management Note  Patient Details  Name: Larry Andrade. MRN: [id number] Date of Birth: June 27, 1961  Subjective/Objective:                 Patient was admitted 08/16/2018 for shortness of breath.  Also found to have hemoglobin of 6.4 and required transfusion.  He is for discharge today. Patient is not able to tell CM whether he has part d coverage for medications.  He declines to use [facility] where his generic meds are on the four dollar list.  Patient says "I am not a smart man and I do not know but it is too far for me to go to [facility]".  You can call my girl friend Larry Andrade and it is ok for you to talk to my Momma."  Patient discharged before CM could complete assessment.  There was no CM consult ordered. Found that discharge diagnosis is acute on chronic heart failure which was not included in a hand off.  CM does not know if patient has access to scales for daily weights and unsure of heart failure education that may have or may not have been provided.   CM left a voicemail message for girlfriend Larry Andrade.  Spoke with patient's mother and she says that patient drives Larry Andrade daily to gather eggs. This would indicate that patient would not meet homebound status for home health.   Patient says his girlfriend is good with computers. [facility] in [place] reports patient is current picking up his prescriptions.  Patient today discharging on 3 new meds- lasix (4 dollars) flomax and prednisone. Says he can pay for these meds today. His other meds are either OTC or chronic.  He does have a follow up appointment at the [facility]. Spoke with Larry Andrade and discussed the importance of patient obtaining a medicare D policy ASAP and she will pursue this for the patient.  Also provided her with contact information for the [facility] over the phone. Discussed the [facility] four dollar list. This patient is not capable of navigating applications and managing schedule of appointments on his  own.   Action/Plan:   Expected Discharge Date:  08/21/18               Expected Discharge Plan:     In-House Referral:     Discharge planning Services     Post Acute Care Choice:    Choice offered to:     DME Arranged:    DME Agency:     HH Arranged:    HH Agency:     Status of Service:     If discussed at [employer], dates discussed:    Additional Comments:  Larry Hong, RN 08/21/2018, 3:42 PM

## 2018-08-21 NOTE — Plan of Care (Signed)

## 2018-08-21 NOTE — Care Management Note (Signed)
Case Management Note  Patient Details  Name: Larry Andrade. MRN: [id number] Date of Birth: 1961/04/08  Subjective/Objective:                 Patient is for discharge home today with home health.  Spoke with patient's sister Larry Andrade. She says she is HCPOA For patient. patient's son has no involvement in management of his care needs. Agency preference for home health is [facility].  Patient has a walker but does not have a bedside commode. No agency preference for the [facility]. aAmily to transport home  Action/Plan:  Referral for RN PT Aide and SW called to and accepted by [facility].  [facility] provided the bedside commode  Expected Discharge Date:  08/21/18               Expected Discharge Plan:     In-House Referral:     Discharge planning Services     Post Acute Care Choice:   Larry Andrade - sister- [facility] Choice offered to:     DME Arranged:   [facility] DME Agency:   [facility]  HH Arranged:   RN PT Aide SW HH Agency:   [facility]  Status of Service:     If discussed at [employer], dates discussed:    Additional Comments:  [name], RN 08/21/2018, 4:41 PM

## 2018-08-21 NOTE — Discharge Summary (Signed)
[facility] [place] [facility]   PATIENT NAME: Larry Andrade    MR#:  [id number]  DATE OF BIRTH:  [birth year]  DATE OF ADMISSION:  08/16/2018   ADMITTING PHYSICIAN: Arnaldo Natal, MD  DATE OF DISCHARGE: 08/21/2018  PRIMARY CARE PHYSICIAN: System, Pcp Not In   ADMISSION DIAGNOSIS:  SOB DISCHARGE DIAGNOSIS:  Active Problems:   Acute on chronic systolic CHF (congestive heart failure) (HCC)  SECONDARY DIAGNOSIS:   Past Medical History:  Diagnosis Date  . CHF (congestive heart failure) (HCC)   . Cirrhosis (HCC)   . GERD (gastroesophageal reflux disease)   . Gout   . Hypertension   . Pancreatitis    HOSPITAL COURSE:  This is a 58 year old male admitted for CHF exacerbation. 1. CHF: Acute on chronic systolic failure.Continue IV Lasix based on renal function. The patient likely has decreased oncotic pressure secondary to cirrhosis which will make diuresis more difficult. He does not have shortness of breath or hypoxia.   tamsulosin to help with urinary flow as the patient reports that he sometimes has urinary hesitancy. IV Lasix was d/c due to worsening in the renal function and gave some albumin injections to help as he has very low albumin. Resumed lasix 20 mg po bid.  Acute renal failure due to dehydration.  Better.  2. 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 The patient has not been drinking heavily for quite a long time. Continue thiamine and folic acid. No ascites identified in the peritoneal cavity by ultrasound.  3. Hypertension: Controlled; continue lopressor. 4. Gout attack:  appear to be an acute flare at this time  Added percocet for pain control. Dr. Gavin Potters did drainage from the joint and sent to lab for cultures. Continue colchicine and prednisone.  Follow-up with Dr. Gavin Potters as outpatient.  5.  Macrocytic anemia-this seems to be secondary to malnutrition due to alcohol abuse and he will also have history of  colectomy so it could be malabsorption. S/p blood transfusion 2 unit PRBC, Hb up to 7.9. follow Dr. Berna Bue as outpatient.  6. GI prophylaxis: Pantoprazole per home regimen  Hyponatremia.  improving. Hypomagnesemia.  Improved with Magnesium supplement. Hypophosphatemia.  improved with Supplement. Leukocytosis, possible due to steroid.  Follow-up CBC as outpatient. DISCHARGE CONDITIONS:  Stable, discharge to home today. CONSULTS OBTAINED:  Treatment Team:  Alwyn Pea, MD Kandyce Rud., MD DRUG ALLERGIES:   Allergies  Allergen Reactions  . Penicillins Rash    Has patient had a PCN reaction causing immediate rash, facial/tongue/throat swelling, SOB or lightheadedness with hypotension: No Has patient had a PCN reaction causing severe rash involving mucus membranes or skin necrosis: No Has patient had a PCN reaction that required hospitalization: No Has patient had a PCN reaction occurring within the last 10 years: No If all of the above answers are "NO", then may proceed with Cephalosporin use.    DISCHARGE MEDICATIONS:   Allergies as of 08/21/2018      Reactions   Penicillins Rash   Has patient had a PCN reaction causing immediate rash, facial/tongue/throat swelling, SOB or lightheadedness with hypotension: No Has patient had a PCN reaction causing severe rash involving mucus membranes or skin necrosis: No Has patient had a PCN reaction that required hospitalization: No Has patient had a PCN reaction occurring within the last 10 years: No If all of the above answers are "NO", then may proceed with Cephalosporin use.      Medication List  TAKE these medications   albuterol (2.5 MG/3ML) 0.083% nebulizer solution Commonly known as:  PROVENTIL Take 2.5 mg by nebulization every 6 (six) hours as needed for wheezing or shortness of breath.   allopurinol 100 MG tablet Commonly known as:  ZYLOPRIM Take 1 tablet (100 mg total) by mouth daily.   colchicine 0.6  MG tablet Take 1 tablet (0.6 mg total) by mouth daily. What changed:  how much to take   cyanocobalamin 1000 MCG tablet Take 1 tablet (1,000 mcg total) by mouth daily.   folic acid 1 MG tablet Commonly known as:  FOLVITE Take 1 tablet (1 mg total) by mouth daily.   furosemide 20 MG tablet Commonly known as:  LASIX Take 1 tablet (20 mg total) by mouth 2 (two) times daily.   guaiFENesin-dextromethorphan 100-10 MG/5ML syrup Commonly known as:  ROBITUSSIN DM Take 5 mLs by mouth every 4 (four) hours as needed for cough (chest congestion).   metoprolol tartrate 25 MG tablet Commonly known as:  LOPRESSOR Take 25 mg by mouth 2 (two) times daily.   multivitamin with minerals Tabs tablet Take 1 tablet by mouth daily.   oxyCODONE 5 MG immediate release tablet Commonly known as:  Oxy IR/ROXICODONE Take 5 mg by mouth every 6 (six) hours as needed for severe pain.   pantoprazole 40 MG tablet Commonly known as:  PROTONIX Take 1 tablet (40 mg total) by mouth daily.   predniSONE 50 MG tablet Commonly known as:  DELTASONE Take 1 tablet (50 mg total) by mouth daily with breakfast.   tamsulosin 0.4 MG Caps capsule Commonly known as:  FLOMAX Take 1 capsule (0.4 mg total) by mouth daily.        DISCHARGE INSTRUCTIONS:  See AVS.  If you experience worsening of your admission symptoms, develop shortness of breath, life threatening emergency, suicidal or homicidal thoughts you must seek medical attention immediately by calling 911 or calling your MD immediately  if symptoms less severe.  You Must read complete instructions/literature along with all the possible adverse reactions/side effects for all the Medicines you take and that have been prescribed to you. Take any new Medicines after you have completely understood and accpet all the possible adverse reactions/side effects.   Please note  You were cared for by a hospitalist during your hospital stay. If you have any questions about  your discharge medications or the care you received while you were in the hospital after you are discharged, you can call the unit and asked to speak with the hospitalist on call if the hospitalist that took care of you is not available. Once you are discharged, your primary care physician will handle any further medical issues. Please note that NO REFILLS for any discharge medications will be authorized once you are discharged, as it is imperative that you return to your primary care physician (or establish a relationship with a primary care physician if you do not have one) for your aftercare needs so that they can reassess your need for medications and monitor your lab values.    On the day of Discharge:  VITAL SIGNS:  Blood pressure (!) 130/92, pulse (!) 106, temperature 97.7 F (36.5 C), temperature source Oral, resp. rate 19, height  (1.854 m), weight 90.2 kg, SpO2 94 %. PHYSICAL EXAMINATION:  GENERAL:  58 y.o.-year-old patient lying in the bed with no acute distress.  EYES: Pupils equal, round, reactive to light and accommodation. No scleral icterus. Extraocular muscles intact.  HEENT: Head atraumatic,  normocephalic. Oropharynx and nasopharynx clear.  NECK:  Supple, no jugular venous distention. No thyroid enlargement, no tenderness.  LUNGS: Normal breath sounds bilaterally, no wheezing, rales,rhonchi or crepitation. No use of accessory muscles of respiration.  CARDIOVASCULAR: S1, S2 normal. No murmurs, rubs, or gallops.  ABDOMEN: Soft, non-tender, non-distended. Bowel sounds present. No organomegaly or mass.  EXTREMITIES: No cyanosis, or clubbing. Bilateral leg edema. NEUROLOGIC: Cranial nerves II through XII are intact. Muscle strength 5/5 in all extremities. Sensation intact. Gait not checked.  PSYCHIATRIC: The patient is alert and oriented x 3.  SKIN: No obvious rash, lesion, or ulcer.  DATA REVIEW:   CBC Recent Labs  Lab 08/21/18 0427  WBC 20.7*  HGB 8.3*  HCT 24.5*  PLT  272    Chemistries  Recent Labs  Lab 08/19/18 0410  08/21/18 0427  NA 129*   < > 131*  K 4.6   < > 4.2  CL 89*   < > 92*  CO2 27   < > 29  GLUCOSE 245*   < > 111*  BUN 22*   < > 24*  CREATININE 1.27*   < > 1.20  CALCIUM 7.0*   < > 7.4*  MG 1.8   < > 1.8  AST 56*  --   --   ALT 15  --   --   ALKPHOS 259*  --   --   BILITOT 2.6*  --   --    < > = values in this interval not displayed.     Microbiology Results  Results for orders placed or performed during the hospital encounter of 08/16/18  Body fluid culture     Status: None (Preliminary result)   Collection Time: 08/18/18  4:18 PM  Result Value Ref Range Status   Specimen Description   Final    KNEE Performed at [facility], [address] [place], [place] [address]    Special Requests   Final    JOINT FLUID Performed at [facility], [address] [place], [place] [address]    Gram Stain   Final    RARE WBC PRESENT, PREDOMINANTLY PMN NO ORGANISMS SEEN    Culture   Final    NO GROWTH 3 DAYS Performed at [facility], [address] [place], [place] [address]    Report Status PENDING  Incomplete    RADIOLOGY:  No results found.   Management plans discussed with the patient, family and they are in agreement.  CODE STATUS: Full Code   TOTAL TIME TAKING CARE OF THIS PATIENT: 35 minutes.    Shaune Pollack M.D on 08/21/2018 at 11:56 AM  Between 7am to 6pm - Pager - [phone]  After 6pm go to [email or web address] - Social research officer, government  [facility] [place] [facility]  Office  [phone]  CC: Primary care physician; System, Pcp Not In   Note: This dictation was prepared with Dragon dictation along with smaller phrase technology. Any transcriptional errors that result from this process are unintentional.

## 2018-08-21 NOTE — Care Management Important Message (Signed)
Important Message  Patient Details  Name: Larry Andrade. MRN: [id number] Date of Birth: [birth year]   Medicare Important Message Given:  Yes    Olegario Messier A Arlis Yale 08/21/2018, 11:48 AM

## 2018-08-21 NOTE — Consult Note (Signed)
PHARMACY CONSULT NOTE - FOLLOW UP  Pharmacy Consult for Electrolyte Monitoring and Replacement   Recent Labs: Potassium (mmol/L)  Date Value  08/21/2018 4.2   Magnesium (mg/dL)  Date Value  50/56/9794 1.8   Calcium (mg/dL)  Date Value  80/16/5537 7.4 (L)   Calcium, Total (PTH) (mg/dL)  Date Value  48/27/0786 9.4   Albumin (g/dL)  Date Value  75/44/9201 2.2 (L)   Phosphorus (mg/dL)  Date Value  00/71/2197 2.4 (L)   Sodium (mmol/L)  Date Value  08/21/2018 131 (L)   Correct Calcium: 8.44  Assessment: Pharmacy consulted for electrolyte monitoring and replacement in 58 yo male with PMH of alcohol abuse. Patient is admitted with acute CHF exacerbation.   Goal of Therapy:  Electrolytes WNL   Plan:  2/24 Phos: 2.4, K: 4.2, Mg: 1.8. Ordered KPhos 500 mg PO every 4 hours x 2 doses. No additional replacement needed at this time.   Will recheck electrolytes with AM labs and continue to replace as needed.    Bettey Costa, PharmD Clinical Pharmacist 08/21/2018 7:42 AM

## 2018-08-22 LAB — ECHOCARDIOGRAM COMPLETE
Height: 73 in
Weight: 3200 oz

## 2018-08-22 LAB — BODY FLUID CULTURE: Culture: NO GROWTH

## 2018-08-22 LAB — AFP TUMOR MARKER: AFP, Serum, Tumor Marker: 5.6 ng/mL (ref 0.0–8.3)

## 2018-08-25 ENCOUNTER — Telehealth: Payer: Self-pay | Admitting: Family

## 2018-08-25 ENCOUNTER — Ambulatory Visit: Payer: Medicare Other | Admitting: Family

## 2018-08-25 NOTE — Progress Notes (Deleted)
   Patient ID: Larry Andrade., male    DOB: [birth year], 58 y.o.   MRN: [id number]  HPI  Larry Andrade is a 58 y/o male with a history of  Echo report from 08/17/2018 reviewed and showed an EF of 55-60%.  Admitted 08/16/2018 due to acute on chronic heart failure. Cardiology consult obtained. IV lasix was initially given and then stopped due to drop in renal function. Oral diuretics began. Albumin levels low so that was replenished. Discharged after 5 days.   He presents today for his initial visit with a chief complaint of   Review of Systems    Physical Exam    Assessment & Plan:  1: Chronic heart failure with preserved ejection fraction- - NYHA class  2: HTN- - BP - BMP  3: Cirrhosis of the liver- - due to previous heavy alcohol use

## 2018-08-25 NOTE — Telephone Encounter (Signed)
Patient did not show for his [facility] appointment on 08/25/2018. Will attempt to reschedule.

## 2018-08-26 ENCOUNTER — Other Ambulatory Visit: Payer: Self-pay

## 2018-08-26 ENCOUNTER — Inpatient Hospital Stay: Payer: Medicare Other

## 2018-08-26 ENCOUNTER — Emergency Department: Payer: Medicare Other

## 2018-08-26 ENCOUNTER — Inpatient Hospital Stay
Admission: EM | Admit: 2018-08-26 | Discharge: 2018-09-08 | DRG: 854 | Disposition: A | Discharge status: Skilled Nursing Facility | Payer: Medicare Other | Attending: Internal Medicine | Admitting: Internal Medicine

## 2018-08-26 ENCOUNTER — Inpatient Hospital Stay: Payer: Medicare Other | Admitting: Anesthesiology

## 2018-08-26 ENCOUNTER — Encounter
Admission: EM | Disposition: A | Discharge status: Skilled Nursing Facility | Payer: Self-pay | Source: Home / Self Care | Attending: Internal Medicine

## 2018-08-26 DIAGNOSIS — Z9181 History of falling: Secondary | ICD-10-CM

## 2018-08-26 DIAGNOSIS — M1A9XX1 Chronic gout, unspecified, with tophus (tophi): Secondary | ICD-10-CM | POA: Diagnosis present

## 2018-08-26 DIAGNOSIS — B9562 Methicillin resistant Staphylococcus aureus infection as the cause of diseases classified elsewhere: Secondary | ICD-10-CM | POA: Diagnosis present

## 2018-08-26 DIAGNOSIS — M1711 Unilateral primary osteoarthritis, right knee: Secondary | ICD-10-CM | POA: Diagnosis present

## 2018-08-26 DIAGNOSIS — Z79899 Other long term (current) drug therapy: Secondary | ICD-10-CM | POA: Diagnosis not present

## 2018-08-26 DIAGNOSIS — A419 Sepsis, unspecified organism: Secondary | ICD-10-CM

## 2018-08-26 DIAGNOSIS — M25569 Pain in unspecified knee: Secondary | ICD-10-CM

## 2018-08-26 DIAGNOSIS — F1010 Alcohol abuse, uncomplicated: Secondary | ICD-10-CM | POA: Diagnosis present

## 2018-08-26 DIAGNOSIS — A4102 Sepsis due to Methicillin resistant Staphylococcus aureus: Secondary | ICD-10-CM | POA: Diagnosis present

## 2018-08-26 DIAGNOSIS — K7460 Unspecified cirrhosis of liver: Secondary | ICD-10-CM | POA: Diagnosis not present

## 2018-08-26 DIAGNOSIS — I509 Heart failure, unspecified: Secondary | ICD-10-CM | POA: Diagnosis not present

## 2018-08-26 DIAGNOSIS — J449 Chronic obstructive pulmonary disease, unspecified: Secondary | ICD-10-CM | POA: Diagnosis present

## 2018-08-26 DIAGNOSIS — Z823 Family history of stroke: Secondary | ICD-10-CM | POA: Diagnosis not present

## 2018-08-26 DIAGNOSIS — Z6826 Body mass index (BMI) 26.0-26.9, adult: Secondary | ICD-10-CM

## 2018-08-26 DIAGNOSIS — I480 Paroxysmal atrial fibrillation: Secondary | ICD-10-CM | POA: Diagnosis not present

## 2018-08-26 DIAGNOSIS — K7030 Alcoholic cirrhosis of liver without ascites: Secondary | ICD-10-CM | POA: Diagnosis present

## 2018-08-26 DIAGNOSIS — F17210 Nicotine dependence, cigarettes, uncomplicated: Secondary | ICD-10-CM | POA: Diagnosis present

## 2018-08-26 DIAGNOSIS — I110 Hypertensive heart disease with heart failure: Secondary | ICD-10-CM | POA: Diagnosis present

## 2018-08-26 DIAGNOSIS — E869 Volume depletion, unspecified: Secondary | ICD-10-CM | POA: Diagnosis not present

## 2018-08-26 DIAGNOSIS — E440 Moderate protein-calorie malnutrition: Secondary | ICD-10-CM | POA: Diagnosis present

## 2018-08-26 DIAGNOSIS — M109 Gout, unspecified: Secondary | ICD-10-CM | POA: Diagnosis not present

## 2018-08-26 DIAGNOSIS — M009 Pyogenic arthritis, unspecified: Secondary | ICD-10-CM | POA: Diagnosis not present

## 2018-08-26 DIAGNOSIS — Z880 Allergy status to penicillin: Secondary | ICD-10-CM | POA: Diagnosis not present

## 2018-08-26 DIAGNOSIS — N400 Enlarged prostate without lower urinary tract symptoms: Secondary | ICD-10-CM | POA: Diagnosis present

## 2018-08-26 DIAGNOSIS — I5032 Chronic diastolic (congestive) heart failure: Secondary | ICD-10-CM | POA: Diagnosis present

## 2018-08-26 DIAGNOSIS — Z8249 Family history of ischemic heart disease and other diseases of the circulatory system: Secondary | ICD-10-CM

## 2018-08-26 DIAGNOSIS — M00061 Staphylococcal arthritis, right knee: Secondary | ICD-10-CM | POA: Diagnosis present

## 2018-08-26 DIAGNOSIS — I4891 Unspecified atrial fibrillation: Secondary | ICD-10-CM | POA: Diagnosis not present

## 2018-08-26 DIAGNOSIS — K219 Gastro-esophageal reflux disease without esophagitis: Secondary | ICD-10-CM | POA: Diagnosis present

## 2018-08-26 DIAGNOSIS — D72829 Elevated white blood cell count, unspecified: Secondary | ICD-10-CM | POA: Diagnosis not present

## 2018-08-26 DIAGNOSIS — R609 Edema, unspecified: Secondary | ICD-10-CM

## 2018-08-26 DIAGNOSIS — R7881 Bacteremia: Secondary | ICD-10-CM | POA: Diagnosis not present

## 2018-08-26 DIAGNOSIS — D649 Anemia, unspecified: Secondary | ICD-10-CM | POA: Diagnosis present

## 2018-08-26 HISTORY — PX: KNEE ARTHROSCOPY: SHX127

## 2018-08-26 LAB — SYNOVIAL CELL COUNT + DIFF, W/ CRYSTALS
Eosinophils-Synovial: 0 %
LYMPHOCYTES-SYNOVIAL FLD: 10 %
Monocyte-Macrophage-Synovial Fluid: 2 %
Neutrophil, Synovial: 88 %
WBC, Synovial: 199176 /mm3 — ABNORMAL HIGH (ref 0–200)

## 2018-08-26 LAB — URINALYSIS, COMPLETE (UACMP) WITH MICROSCOPIC
Bacteria, UA: NONE SEEN
Glucose, UA: NEGATIVE mg/dL
Ketones, ur: NEGATIVE mg/dL
Leukocytes,Ua: NEGATIVE
Nitrite: NEGATIVE
Protein, ur: 30 mg/dL — AB
Specific Gravity, Urine: 1.025 (ref 1.005–1.030)
pH: 5 (ref 5.0–8.0)

## 2018-08-26 LAB — CBC WITH DIFFERENTIAL/PLATELET
Abs Immature Granulocytes: 0.66 10*3/uL — ABNORMAL HIGH (ref 0.00–0.07)
Basophils Absolute: 0.1 10*3/uL (ref 0.0–0.1)
Basophils Relative: 0 %
Eosinophils Absolute: 0.1 10*3/uL (ref 0.0–0.5)
Eosinophils Relative: 0 %
HCT: 33.3 % — ABNORMAL LOW (ref 39.0–52.0)
Hemoglobin: 10.7 g/dL — ABNORMAL LOW (ref 13.0–17.0)
Immature Granulocytes: 2 %
LYMPHS ABS: 1.9 10*3/uL (ref 0.7–4.0)
Lymphocytes Relative: 5 %
MCH: 33.6 pg (ref 26.0–34.0)
MCHC: 32.1 g/dL (ref 30.0–36.0)
MCV: 104.7 fL — ABNORMAL HIGH (ref 80.0–100.0)
Monocytes Absolute: 1.2 10*3/uL — ABNORMAL HIGH (ref 0.1–1.0)
Monocytes Relative: 3 %
NEUTROS ABS: 31.1 10*3/uL — AB (ref 1.7–7.7)
Neutrophils Relative %: 90 %
Platelets: 305 10*3/uL (ref 150–400)
RBC: 3.18 MIL/uL — ABNORMAL LOW (ref 4.22–5.81)
RDW: 20.1 % — AB (ref 11.5–15.5)
Smear Review: ADEQUATE
WBC: 35 10*3/uL — ABNORMAL HIGH (ref 4.0–10.5)
nRBC: 0 % (ref 0.0–0.2)

## 2018-08-26 LAB — MRSA PCR SCREENING: MRSA by PCR: NEGATIVE

## 2018-08-26 LAB — PROTIME-INR
INR: 1.3 — ABNORMAL HIGH (ref 0.8–1.2)
PROTHROMBIN TIME: 16.4 s — AB (ref 11.4–15.2)

## 2018-08-26 LAB — COMPREHENSIVE METABOLIC PANEL
ALT: 17 U/L (ref 0–44)
AST: 28 U/L (ref 15–41)
Albumin: 2.1 g/dL — ABNORMAL LOW (ref 3.5–5.0)
Alkaline Phosphatase: 225 U/L — ABNORMAL HIGH (ref 38–126)
Anion gap: 16 — ABNORMAL HIGH (ref 5–15)
BUN: 26 mg/dL — ABNORMAL HIGH (ref 6–20)
CO2: 21 mmol/L — ABNORMAL LOW (ref 22–32)
Calcium: 7.9 mg/dL — ABNORMAL LOW (ref 8.9–10.3)
Chloride: 98 mmol/L (ref 98–111)
Creatinine, Ser: 1.34 mg/dL — ABNORMAL HIGH (ref 0.61–1.24)
GFR calc non Af Amer: 59 mL/min — ABNORMAL LOW (ref >60)
Glucose, Bld: 81 mg/dL (ref 70–99)
POTASSIUM: 3.8 mmol/L (ref 3.5–5.1)
Sodium: 135 mmol/L (ref 135–145)
Total Bilirubin: 4.9 mg/dL — ABNORMAL HIGH (ref 0.3–1.2)
Total Protein: 6.3 g/dL — ABNORMAL LOW (ref 6.5–8.1)

## 2018-08-26 LAB — INFLUENZA PANEL BY PCR (TYPE A & B)
INFLAPCR: NEGATIVE
Influenza B By PCR: NEGATIVE

## 2018-08-26 LAB — TROPONIN I: Troponin I: 0.03 ng/mL (ref <0.03)

## 2018-08-26 LAB — MAGNESIUM: MAGNESIUM: 1.6 mg/dL — AB (ref 1.7–2.4)

## 2018-08-26 LAB — LACTIC ACID, PLASMA
Lactic Acid, Venous: 3.8 mmol/L (ref 0.5–1.9)
Lactic Acid, Venous: 2.6 mmol/L (ref 0.5–1.9)

## 2018-08-26 LAB — SEDIMENTATION RATE: Sed Rate: 127 mm/hr — ABNORMAL HIGH (ref 0–20)

## 2018-08-26 LAB — BRAIN NATRIURETIC PEPTIDE: B Natriuretic Peptide: 361 pg/mL — ABNORMAL HIGH (ref 0.0–100.0)

## 2018-08-26 SURGERY — ARTHROSCOPY, KNEE
Anesthesia: General | Laterality: Right

## 2018-08-26 MED ORDER — MAGNESIUM SULFATE 2 GM/50ML IV SOLN: 2.0000 g | Freq: Once | INTRAVENOUS | Status: DC

## 2018-08-26 MED ORDER — SODIUM CHLORIDE 0.9 % IV BOLUS
500.0000 mL | Freq: Once | INTRAVENOUS | Status: AC
  Administered 2018-08-26: 500 mL via INTRAVENOUS

## 2018-08-26 MED ORDER — MORPHINE SULFATE (PF) 2 MG/ML IV SOLN: 2.0000 mg | INTRAVENOUS | Status: DC | PRN

## 2018-08-26 MED ORDER — FOLIC ACID 1 MG PO TABS
1.0000 mg | ORAL_TABLET | Freq: Every day | ORAL | Status: DC
  Administered 2018-08-27 – 2018-09-08 (×13): 1 mg via ORAL
  Filled 2018-08-26 (×13): qty 1

## 2018-08-26 MED ORDER — HYDROMORPHONE HCL 1 MG/ML IJ SOLN
INTRAMUSCULAR | Status: AC
  Administered 2018-08-26: 0.25 mg via INTRAVENOUS
  Filled 2018-08-26: qty 1

## 2018-08-26 MED ORDER — LIDOCAINE HCL (CARDIAC) PF 100 MG/5ML IV SOSY
PREFILLED_SYRINGE | INTRAVENOUS | Status: DC | PRN
  Administered 2018-08-26: 80 mg via INTRAVENOUS

## 2018-08-26 MED ORDER — LORAZEPAM 2 MG PO TABS: 0.0000 mg | ORAL_TABLET | Freq: Two times a day (BID) | ORAL | Status: AC

## 2018-08-26 MED ORDER — SUCCINYLCHOLINE CHLORIDE 20 MG/ML IJ SOLN
INTRAMUSCULAR | Status: DC | PRN
  Administered 2018-08-26: 100 mg via INTRAVENOUS

## 2018-08-26 MED ORDER — VANCOMYCIN HCL IN DEXTROSE 1-5 GM/200ML-% IV SOLN
1000.0000 mg | Freq: Two times a day (BID) | INTRAVENOUS | Status: DC
  Filled 2018-08-26 (×4): qty 200

## 2018-08-26 MED ORDER — SODIUM CHLORIDE 0.9% FLUSH: 3.0000 mL | Freq: Once | INTRAVENOUS | Status: DC

## 2018-08-26 MED ORDER — ADULT MULTIVITAMIN W/MINERALS CH: 1.0000 | ORAL_TABLET | Freq: Every day | ORAL | Status: DC

## 2018-08-26 MED ORDER — SODIUM CHLORIDE 0.9 % IV BOLUS
1000.0000 mL | Freq: Once | INTRAVENOUS | Status: AC
  Administered 2018-08-26: 1000 mL via INTRAVENOUS

## 2018-08-26 MED ORDER — LIDOCAINE HCL (PF) 1 % IJ SOLN
INTRAMUSCULAR | Status: AC
  Filled 2018-08-26: qty 30

## 2018-08-26 MED ORDER — HYDROMORPHONE HCL 1 MG/ML IJ SOLN
0.2500 mg | INTRAMUSCULAR | Status: AC | PRN
  Administered 2018-08-26 – 2018-08-27 (×8): 0.25 mg via INTRAVENOUS

## 2018-08-26 MED ORDER — GUAIFENESIN-DM 100-10 MG/5ML PO SYRP
5.0000 mL | ORAL_SOLUTION | ORAL | Status: DC | PRN
  Administered 2018-08-30 – 2018-09-02 (×2): 5 mL via ORAL
  Filled 2018-08-26 (×3): qty 5

## 2018-08-26 MED ORDER — ENOXAPARIN SODIUM 40 MG/0.4ML ~~LOC~~ SOLN: 40.0000 mg | SUBCUTANEOUS | Status: DC

## 2018-08-26 MED ORDER — VANCOMYCIN HCL IN DEXTROSE 1-5 GM/200ML-% IV SOLN
1000.0000 mg | Freq: Once | INTRAVENOUS | Status: AC
  Administered 2018-08-26: 1000 mg via INTRAVENOUS
  Filled 2018-08-26: qty 200

## 2018-08-26 MED ORDER — ACETAMINOPHEN 325 MG PO TABS: 650.0000 mg | ORAL_TABLET | Freq: Four times a day (QID) | ORAL | Status: DC | PRN

## 2018-08-26 MED ORDER — ONDANSETRON HCL 4 MG/2ML IJ SOLN
INTRAMUSCULAR | Status: DC | PRN
  Administered 2018-08-26: 4 mg via INTRAVENOUS

## 2018-08-26 MED ORDER — CEFAZOLIN SODIUM-DEXTROSE 2-4 GM/100ML-% IV SOLN: 2.0000 g | Freq: Once | INTRAVENOUS | Status: DC

## 2018-08-26 MED ORDER — ALLOPURINOL 100 MG PO TABS
100.0000 mg | ORAL_TABLET | Freq: Every day | ORAL | Status: DC
  Administered 2018-08-27 – 2018-09-06 (×11): 100 mg via ORAL
  Filled 2018-08-26 (×12): qty 1

## 2018-08-26 MED ORDER — BISACODYL 10 MG RE SUPP
10.0000 mg | Freq: Every day | RECTAL | Status: DC | PRN
  Filled 2018-08-26: qty 1

## 2018-08-26 MED ORDER — LIDOCAINE HCL 1 % IJ SOLN
INTRAMUSCULAR | Status: DC | PRN
  Administered 2018-08-26: 5 mL

## 2018-08-26 MED ORDER — THIAMINE HCL 100 MG/ML IJ SOLN
100.0000 mg | Freq: Every day | INTRAMUSCULAR | Status: DC
  Administered 2018-09-01 – 2018-09-03 (×2): 100 mg via INTRAVENOUS
  Filled 2018-08-26 (×3): qty 2

## 2018-08-26 MED ORDER — SUGAMMADEX SODIUM 500 MG/5ML IV SOLN
INTRAVENOUS | Status: DC | PRN
  Administered 2018-08-26: 150 mg via INTRAVENOUS

## 2018-08-26 MED ORDER — LIDOCAINE-EPINEPHRINE 2 %-1:100000 IJ SOLN
20.0000 mL | Freq: Once | INTRAMUSCULAR | Status: AC
  Administered 2018-08-26: 20 mL
  Filled 2018-08-26: qty 1

## 2018-08-26 MED ORDER — FUROSEMIDE 20 MG PO TABS
20.0000 mg | ORAL_TABLET | Freq: Two times a day (BID) | ORAL | Status: DC
  Administered 2018-08-27 – 2018-09-08 (×25): 20 mg via ORAL
  Filled 2018-08-26 (×26): qty 1

## 2018-08-26 MED ORDER — HYDROMORPHONE HCL 1 MG/ML IJ SOLN
INTRAMUSCULAR | Status: AC
  Filled 2018-08-26: qty 1

## 2018-08-26 MED ORDER — VITAMIN B-1 100 MG PO TABS
100.0000 mg | ORAL_TABLET | Freq: Every day | ORAL | Status: DC
  Administered 2018-08-27 – 2018-09-08 (×12): 100 mg via ORAL
  Filled 2018-08-26 (×13): qty 1

## 2018-08-26 MED ORDER — PREDNISONE 50 MG PO TABS
50.0000 mg | ORAL_TABLET | Freq: Every day | ORAL | Status: DC
  Administered 2018-08-27 – 2018-08-31 (×5): 50 mg via ORAL
  Filled 2018-08-26 (×6): qty 1

## 2018-08-26 MED ORDER — IOHEXOL 300 MG/ML  SOLN
100.0000 mL | Freq: Once | INTRAMUSCULAR | Status: AC | PRN
  Administered 2018-08-26: 100 mL via INTRAVENOUS

## 2018-08-26 MED ORDER — METOPROLOL TARTRATE 25 MG PO TABS
25.0000 mg | ORAL_TABLET | Freq: Two times a day (BID) | ORAL | Status: DC
  Administered 2018-08-27 – 2018-09-08 (×25): 25 mg via ORAL
  Filled 2018-08-26 (×26): qty 1

## 2018-08-26 MED ORDER — SODIUM CHLORIDE 0.9 % IV SOLN
2.0000 g | Freq: Two times a day (BID) | INTRAVENOUS | Status: DC
  Administered 2018-08-27: 2 g via INTRAVENOUS
  Filled 2018-08-26: qty 2

## 2018-08-26 MED ORDER — ACETAMINOPHEN 650 MG RE SUPP: 650.0000 mg | Freq: Four times a day (QID) | RECTAL | Status: DC | PRN

## 2018-08-26 MED ORDER — FENTANYL CITRATE (PF) 100 MCG/2ML IJ SOLN
INTRAMUSCULAR | Status: AC
  Filled 2018-08-26: qty 2

## 2018-08-26 MED ORDER — ONDANSETRON HCL 4 MG PO TABS: 4.0000 mg | ORAL_TABLET | Freq: Four times a day (QID) | ORAL | Status: DC | PRN

## 2018-08-26 MED ORDER — BUPIVACAINE HCL (PF) 0.25 % IJ SOLN
INTRAMUSCULAR | Status: AC
  Filled 2018-08-26: qty 30

## 2018-08-26 MED ORDER — ALBUTEROL SULFATE (2.5 MG/3ML) 0.083% IN NEBU
2.5000 mg | INHALATION_SOLUTION | Freq: Four times a day (QID) | RESPIRATORY_TRACT | Status: DC | PRN
  Administered 2018-08-28 – 2018-08-29 (×2): 2.5 mg via RESPIRATORY_TRACT
  Filled 2018-08-26 (×2): qty 3

## 2018-08-26 MED ORDER — ONDANSETRON HCL 4 MG/2ML IJ SOLN: 4.0000 mg | Freq: Four times a day (QID) | INTRAMUSCULAR | Status: DC | PRN

## 2018-08-26 MED ORDER — LORAZEPAM 1 MG PO TABS: 1.0000 mg | ORAL_TABLET | Freq: Four times a day (QID) | ORAL | Status: AC | PRN

## 2018-08-26 MED ORDER — VITAMIN B-12 1000 MCG PO TABS
1000.0000 ug | ORAL_TABLET | Freq: Every day | ORAL | Status: DC
  Administered 2018-08-27 – 2018-09-08 (×12): 1000 ug via ORAL
  Filled 2018-08-26 (×13): qty 1

## 2018-08-26 MED ORDER — SODIUM CHLORIDE 0.9 % IV SOLN
INTRAVENOUS | Status: DC
  Administered 2018-08-26: 21:00:00 via INTRAVENOUS

## 2018-08-26 MED ORDER — ROCURONIUM BROMIDE 100 MG/10ML IV SOLN
INTRAVENOUS | Status: DC | PRN
  Administered 2018-08-26: 20 mg via INTRAVENOUS

## 2018-08-26 MED ORDER — PROPOFOL 10 MG/ML IV BOLUS
INTRAVENOUS | Status: DC | PRN
  Administered 2018-08-26: 160 mg via INTRAVENOUS

## 2018-08-26 MED ORDER — LORAZEPAM 2 MG PO TABS: 0.0000 mg | ORAL_TABLET | Freq: Four times a day (QID) | ORAL | Status: AC

## 2018-08-26 MED ORDER — SODIUM CHLORIDE 0.9 % IV SOLN
2.0000 g | Freq: Once | INTRAVENOUS | Status: AC
  Administered 2018-08-26: 2 g via INTRAVENOUS
  Filled 2018-08-26: qty 2

## 2018-08-26 MED ORDER — MIDAZOLAM HCL 2 MG/2ML IJ SOLN
INTRAMUSCULAR | Status: AC
  Filled 2018-08-26: qty 2

## 2018-08-26 MED ORDER — PANTOPRAZOLE SODIUM 40 MG PO TBEC
40.0000 mg | DELAYED_RELEASE_TABLET | Freq: Every day | ORAL | Status: DC
  Administered 2018-08-27 – 2018-09-08 (×13): 40 mg via ORAL
  Filled 2018-08-26 (×13): qty 1

## 2018-08-26 MED ORDER — COLCHICINE 0.6 MG PO TABS
0.6000 mg | ORAL_TABLET | Freq: Every day | ORAL | Status: DC
  Administered 2018-08-27 – 2018-09-08 (×13): 0.6 mg via ORAL
  Filled 2018-08-26 (×14): qty 1

## 2018-08-26 MED ORDER — LACTATED RINGERS IV SOLN
INTRAVENOUS | Status: DC | PRN
  Administered 2018-08-26: 21:00:00 via INTRAVENOUS

## 2018-08-26 MED ORDER — VANCOMYCIN HCL IN DEXTROSE 750-5 MG/150ML-% IV SOLN
750.0000 mg | Freq: Once | INTRAVENOUS | Status: DC
  Filled 2018-08-26: qty 150

## 2018-08-26 MED ORDER — LORAZEPAM 2 MG/ML IJ SOLN: 1.0000 mg | Freq: Four times a day (QID) | INTRAMUSCULAR | Status: AC | PRN

## 2018-08-26 MED ORDER — FENTANYL CITRATE (PF) 100 MCG/2ML IJ SOLN
INTRAMUSCULAR | Status: DC | PRN
  Administered 2018-08-26: 100 ug via INTRAVENOUS

## 2018-08-26 MED ORDER — SEVOFLURANE IN SOLN
RESPIRATORY_TRACT | Status: AC
  Filled 2018-08-26: qty 250

## 2018-08-26 MED ORDER — OXYCODONE HCL 5 MG PO TABS: 5.0000 mg | ORAL_TABLET | Freq: Four times a day (QID) | ORAL | Status: DC | PRN

## 2018-08-26 MED ORDER — TAMSULOSIN HCL 0.4 MG PO CAPS
0.4000 mg | ORAL_CAPSULE | Freq: Every day | ORAL | Status: DC
  Administered 2018-08-27 – 2018-09-08 (×13): 0.4 mg via ORAL
  Filled 2018-08-26 (×13): qty 1

## 2018-08-26 MED ORDER — FOLIC ACID 1 MG PO TABS: 1.0000 mg | ORAL_TABLET | Freq: Every day | ORAL | Status: DC

## 2018-08-26 MED ORDER — ADULT MULTIVITAMIN W/MINERALS CH
1.0000 | ORAL_TABLET | Freq: Every day | ORAL | Status: DC
  Administered 2018-08-27 – 2018-09-08 (×13): 1 via ORAL
  Filled 2018-08-26 (×13): qty 1

## 2018-08-26 MED ORDER — FENTANYL CITRATE (PF) 100 MCG/2ML IJ SOLN
50.0000 ug | Freq: Once | INTRAMUSCULAR | Status: AC
  Administered 2018-08-26: 50 ug via INTRAVENOUS
  Filled 2018-08-26: qty 2

## 2018-08-26 MED ORDER — EPINEPHRINE PF 1 MG/ML IJ SOLN
INTRAMUSCULAR | Status: AC
  Filled 2018-08-26: qty 1

## 2018-08-26 MED ORDER — POLYETHYLENE GLYCOL 3350 17 G PO PACK: 17.0000 g | PACK | Freq: Every day | ORAL | Status: DC | PRN

## 2018-08-26 SURGICAL SUPPLY — 44 items
ADAPTER IRRIG TUBE 2 SPIKE SOL (ADAPTER) ×6 IMPLANT
BUR RADIUS 3.5 (BURR) ×3 IMPLANT
BUR RADIUS 4.0X18.5 (BURR) ×3 IMPLANT
BUR RADIUS 5.5 (BURR) IMPLANT
BUR RND 5.5 12 HOLLOW (BURR) ×2 IMPLANT
CANISTER SUCT LVC 12 LTR MEDI- (MISCELLANEOUS) ×3 IMPLANT
CLOSURE WOUND 1/2 X4 (GAUZE/BANDAGES/DRESSINGS) ×1
COOLER POLAR GLACIER W/PUMP (MISCELLANEOUS) IMPLANT
COVER WAND RF STERILE (DRAPES) ×3 IMPLANT
CUFF TOURN 24 STER (MISCELLANEOUS) ×2 IMPLANT
CUFF TOURN 30 STER DUAL PORT (MISCELLANEOUS) IMPLANT
DEVICE SUCT BLK HOLE OR FLOOR (MISCELLANEOUS) ×3 IMPLANT
DRAPE IMP U-DRAPE 54X76 (DRAPES) ×3 IMPLANT
DURAPREP 26ML APPLICATOR (WOUND CARE) ×9 IMPLANT
GAUZE PETRO XEROFOAM 1X8 (MISCELLANEOUS) ×3 IMPLANT
GAUZE SPONGE 4X4 12PLY STRL (GAUZE/BANDAGES/DRESSINGS) ×3 IMPLANT
GLOVE BIOGEL PI IND STRL 9 (GLOVE) ×1 IMPLANT
GLOVE BIOGEL PI INDICATOR 9 (GLOVE) ×2
GLOVE SURG 9.0 ORTHO LTXF (GLOVE) ×6 IMPLANT
GOWN STRL REUS TWL 2XL XL LVL4 (GOWN DISPOSABLE) ×3 IMPLANT
GOWN STRL REUS W/ TWL LRG LVL3 (GOWN DISPOSABLE) ×1 IMPLANT
GOWN STRL REUS W/TWL LRG LVL3 (GOWN DISPOSABLE) ×2
IV LACTATED RINGER IRRG 3000ML (IV SOLUTION) ×18
IV LR IRRIG 3000ML ARTHROMATIC (IV SOLUTION) ×4 IMPLANT
KIT TURNOVER KIT A (KITS) ×3 IMPLANT
MANIFOLD NEPTUNE II (INSTRUMENTS) ×3 IMPLANT
MAT ABSORB  FLUID 56X50 GRAY (MISCELLANEOUS) ×4
MAT ABSORB FLUID 56X50 GRAY (MISCELLANEOUS) ×2 IMPLANT
NEEDLE HYPO 22GX1.5 SAFETY (NEEDLE) ×3 IMPLANT
PACK ARTHROSCOPY KNEE (MISCELLANEOUS) ×3 IMPLANT
PAD ABD DERMACEA PRESS 5X9 (GAUZE/BANDAGES/DRESSINGS) ×6 IMPLANT
PAD WRAPON POLAR KNEE (MISCELLANEOUS) ×1 IMPLANT
SET TUBE SUCT SHAVER OUTFL 24K (TUBING) ×3 IMPLANT
SET TUBE TIP INTRA-ARTICULAR (MISCELLANEOUS) ×3 IMPLANT
SOL PREP PVP 2OZ (MISCELLANEOUS)
SOLUTION PREP PVP 2OZ (MISCELLANEOUS) IMPLANT
STRIP CLOSURE SKIN 1/2X4 (GAUZE/BANDAGES/DRESSINGS) ×2 IMPLANT
SUT ETHILON 4-0 (SUTURE) ×2
SUT ETHILON 4-0 FS2 18XMFL BLK (SUTURE) ×1
SUTURE ETHLN 4-0 FS2 18XMF BLK (SUTURE) ×1 IMPLANT
TUBING ARTHRO INFLOW-ONLY STRL (TUBING) ×3 IMPLANT
WAND HAND CNTRL MULTIVAC 50 (MISCELLANEOUS) IMPLANT
WAND HAND CNTRL MULTIVAC 90 (MISCELLANEOUS) ×3 IMPLANT
WRAPON POLAR PAD KNEE (MISCELLANEOUS) ×3

## 2018-08-26 NOTE — Transfer of Care (Signed)
Immediate Anesthesia Transfer of Care Note  Patient: Larry Andrade.  Procedure(s) Performed: ARTHROSCOPY KNEE (Right )  Patient Location: PACU  Anesthesia Type:General  Level of Consciousness: awake and alert   Airway & Oxygen Therapy: Patient Spontanous Breathing  Post-op Assessment: Report given to RN  Post vital signs: Reviewed and stable  Last Vitals:  Vitals Value Taken Time  BP 91/63 08/26/2018 11:02 PM  Temp 37 C 08/26/2018 11:02 PM  Pulse 114 08/26/2018 11:02 PM  Resp 28 08/26/2018 11:02 PM  SpO2 98 % 08/26/2018 11:02 PM  Vitals shown include unvalidated device data.  Last Pain:  Vitals:   08/26/18 1854  TempSrc: Oral  PainSc:          Complications: No apparent anesthesia complications

## 2018-08-26 NOTE — ED Notes (Signed)
Pt transported to CT by CT tech and then will be taken to the floor

## 2018-08-26 NOTE — ED Notes (Signed)
Spoke with Dr. Bella Kennedy and informed him of pts low blood pressure. Per Dr. Alphonzo Lemmings increase fluids to wide open and will put in the addition order for 500 cc.

## 2018-08-26 NOTE — H&P (Signed)
[facility] [place] [facility]   PATIENT NAME: Larry Andrade    MR#:  [id number]  DATE OF BIRTH:  31-Jan-1961  DATE OF ADMISSION:  08/26/2018  PRIMARY CARE PHYSICIAN: Gildardo Pounds, PA   REQUESTING/REFERRING PHYSICIAN: dr Alphonzo Lemmings  CHIEF COMPLAINT:   Right knee pain HISTORY OF PRESENT ILLNESS:  Larry Andrade  is a 58 y.o. male with a known history of chronic diastolic heart failure with preserved ejection fraction (echocardiogram August 17, 2018 shows normal ejection fraction with no LVH however old echocardiogram 2015 does show LVH), or cirrhosis due to EtOH and recent diagnosis of gout who was discharged from the hospital on February 24 with diagnosis of acute on chronic heart failure and gout flare who presents today to the emergency room due to above issue.  During last hospitalization patient was also complaining of right knee pain and had his knee joint tapped.  Cultures from that were negative however he was diagnosed with gout.  He was seen by rheumatology at that time.  He was prescribed colchicine and allopurinol upon discharge.  Today he presents due to fever, malaise and edema of right leg.  ER physician tapped the right knee and Gram stain is showing gram-positive cocci.  He has been started on antibiotics.  I have spoken with orthopedic surgery and consulted ID via epic as patient appears to have septic joint.  PAST MEDICAL HISTORY:   Past Medical History:  Diagnosis Date  . CHF (congestive heart failure) (HCC)   . Cirrhosis (HCC)   . GERD (gastroesophageal reflux disease)   . Gout   . Hypertension   . Pancreatitis     PAST SURGICAL HISTORY:   Past Surgical History:  Procedure Laterality Date  . CARDIAC CATHETERIZATION    . COLON SURGERY    . ESOPHAGOGASTRODUODENOSCOPY (EGD) WITH PROPOFOL N/A 10/26/2017   Procedure: ESOPHAGOGASTRODUODENOSCOPY (EGD) WITH PROPOFOL;  Surgeon: [name], MD;  Location: [facility];  Service: Endoscopy;   Laterality: N/A;    SOCIAL HISTORY:   Social History   Tobacco Use  . Smoking status: Current Every Day Smoker  . Smokeless tobacco: Never Used  Substance Use Topics  . Alcohol use: Yes    Comment: 1-2 times weekly    FAMILY HISTORY:   Family History  Problem Relation Age of Onset  . CVA Mother   . CAD Mother   . CAD Father     DRUG ALLERGIES:   Allergies  Allergen Reactions  . Penicillins Rash    Has patient had a PCN reaction causing immediate rash, facial/tongue/throat swelling, SOB or lightheadedness with hypotension: No Has patient had a PCN reaction causing severe rash involving mucus membranes or skin necrosis: No Has patient had a PCN reaction that required hospitalization: No Has patient had a PCN reaction occurring within the last 10 years: No If all of the above answers are "NO", then may proceed with Cephalosporin use.     REVIEW OF SYSTEMS:   Review of Systems  Constitutional: Positive for fever and malaise/fatigue. Negative for chills.  HENT: Negative.  Negative for ear discharge, ear pain, hearing loss, nosebleeds and sore throat.   Eyes: Negative.  Negative for blurred vision and pain.  Respiratory: Positive for cough. Negative for hemoptysis, shortness of breath and wheezing.   Cardiovascular: Positive for leg swelling. Negative for chest pain, palpitations and PND.  Gastrointestinal: Negative.  Negative for abdominal pain, blood in stool, diarrhea, nausea and vomiting.  Genitourinary: Negative.  Negative for  dysuria.  Musculoskeletal: Positive for joint pain. Negative for back pain.  Skin: Negative.   Neurological: Negative for dizziness, tremors, speech change, focal weakness, seizures and headaches.  Endo/Heme/Allergies: Negative.  Does not bruise/bleed easily.  Psychiatric/Behavioral: Negative.  Negative for depression, hallucinations and suicidal ideas.    MEDICATIONS AT HOME:   Prior to Admission medications   Medication Sig Start Date  End Date Taking? Authorizing Provider  albuterol (PROVENTIL) (2.5 MG/3ML) 0.083% nebulizer solution Take 2.5 mg by nebulization every 6 (six) hours as needed for wheezing or shortness of breath.    [provider]  allopurinol (ZYLOPRIM) 100 MG tablet Take 1 tablet (100 mg total) by mouth daily. 02/25/17   Enid Baas, MD  colchicine 0.6 MG tablet Take 1 tablet (0.6 mg total) by mouth daily. 08/21/18   Shaune Pollack, MD  folic acid (FOLVITE) 1 MG tablet Take 1 tablet (1 mg total) by mouth daily. 08/21/18   Shaune Pollack, MD  furosemide (LASIX) 20 MG tablet Take 1 tablet (20 mg total) by mouth 2 (two) times daily. 08/21/18   Shaune Pollack, MD  guaiFENesin-dextromethorphan [facility] DM) 100-10 MG/5ML syrup Take 5 mLs by mouth every 4 (four) hours as needed for cough (chest congestion). 08/21/18   Shaune Pollack, MD  metoprolol tartrate (LOPRESSOR) 25 MG tablet Take 25 mg by mouth 2 (two) times daily.    [provider]  Multiple Vitamin (MULTIVITAMIN WITH MINERALS) TABS tablet Take 1 tablet by mouth daily. 10/26/17   Alford Highland, MD  oxyCODONE (OXY IR/ROXICODONE) 5 MG immediate release tablet Take 5 mg by mouth every 6 (six) hours as needed for severe pain.    [provider]  pantoprazole (PROTONIX) 40 MG tablet Take 1 tablet (40 mg total) by mouth daily. 10/26/17   Alford Highland, MD  predniSONE (DELTASONE) 50 MG tablet Take 1 tablet (50 mg total) by mouth daily with breakfast. 08/21/18   Shaune Pollack, MD  tamsulosin (FLOMAX) 0.4 MG CAPS capsule Take 1 capsule (0.4 mg total) by mouth daily. 08/21/18   Shaune Pollack, MD  vitamin B-12 1000 MCG tablet Take 1 tablet (1,000 mcg total) by mouth daily. 08/21/18   Shaune Pollack, MD      VITAL SIGNS:  Blood pressure 121/79, pulse (!) 125, temperature 98.7 F (37.1 C), temperature source Oral, resp. rate (!) 24, weight 90.2 kg, SpO2 97 %.  PHYSICAL EXAMINATION:   Physical Exam Constitutional:      General: He is not in acute distress. HENT:      Head: Normocephalic.  Eyes:     General: No scleral icterus.    Pupils: Pupils are equal, round, and reactive to light.  Neck:     Musculoskeletal: Normal range of motion and neck supple.     Vascular: No JVD.     Trachea: No tracheal deviation.  Cardiovascular:     Rate and Rhythm: Normal rate and regular rhythm.     Heart sounds: Normal heart sounds. No murmur. No friction rub. No gallop.   Pulmonary:     Effort: Pulmonary effort is normal. No respiratory distress.     Breath sounds: Normal breath sounds. No wheezing or rales.  Chest:     Chest wall: No tenderness.  Abdominal:     General: Bowel sounds are normal. There is distension.     Palpations: Abdomen is soft. There is no mass.     Tenderness: There is no abdominal tenderness. There is no guarding or rebound.  Musculoskeletal:  General: Swelling and deformity present.     Right lower leg: Edema present.     Comments: Right knee swollen Actually entire leg from the right knee down is swollen/edematous Painful  Skin:    General: Skin is warm.     Findings: No erythema or rash.  Neurological:     General: No focal deficit present.     Mental Status: He is alert and oriented to person, place, and time. Mental status is at baseline.  Psychiatric:        Judgment: Judgment normal.       LABORATORY PANEL:   CBC Recent Labs  Lab 08/26/18 1451  WBC 35.0*  HGB 10.7*  HCT 33.3*  PLT 305   ------------------------------------------------------------------------------------------------------------------  Chemistries  Recent Labs  Lab 08/26/18 1451 08/26/18 1534  NA 135  --   K 3.8  --   CL 98  --   CO2 21*  --   GLUCOSE 81  --   BUN 26*  --   CREATININE 1.34*  --   CALCIUM 7.9*  --   MG  --  1.6*  AST 28  --   ALT 17  --   ALKPHOS 225*  --   BILITOT 4.9*  --    ------------------------------------------------------------------------------------------------------------------  Cardiac  Enzymes No results for input(s): TROPONINI in the last 168 hours. ------------------------------------------------------------------------------------------------------------------  RADIOLOGY:  Dg Chest Port 1 View  Result Date: 08/26/2018 CLINICAL DATA:  Sepsis EXAM: PORTABLE CHEST 1 VIEW COMPARISON:  08/16/2018 chest radiograph FINDINGS: The cardiomediastinal silhouette is unremarkable. There is no evidence of focal airspace disease, pulmonary edema, suspicious pulmonary nodule/mass, pleural effusion, or pneumothorax. No acute bony abnormalities are identified. Remote RIGHT rib fractures. IMPRESSION: No active disease. Electronically Signed   By: Harmon Pier M.D.   On: 08/26/2018 16:41    EKG:    IMPRESSION AND PLAN:   58 year old male with EtOH liver cirrhosis, chronic diastolic heart failure with preserved ejection fraction, ongoing tobacco abuse who was discharged from the hospital on 24 February with exacerbation of CHF and gout flare who presents today to the emergency room with right knee swelling and pain.  1.  Sepsis from right knee septic joint: Patient presents with tachycardia, tachypnea and leukocytosis Gram stain showing gram-positive cocci Start vancomycin Patient will need to go to the OR.  He is at moderate risk for moderate risk procedure but should proceed and this was discussed with Dr. Martha Clan ID and orthopedic surgery consultations via epic Follow-up final cultures Order CT of lower extremity and lower extremity Doppler to rule out DVT and as well as abscesses further down from the right knee due to edema. Lactic acid level 2.  Chronic diastolic heart failure with preserved ejection fraction: No signs of exacerbation at this time Continue Lasix 3.  EtOH abuse with liver cirrhosis: Patient reports his last drink was Wednesday CIWA protocol initiated No ascites on recent abdominal ultrasound   4. Hypo-magnesium: Replete and recheck in a.m. 5.  Recent gout  flare: Continue allopurinol and colchicine  6.  Essential hypertension: Continue metoprolol  7. Tobacco dependence: Patient is encouraged to quit smoking. Counseling was provided for 4 minutes.  8.  BPH: Continue tamsulosin    All the records are reviewed and case discussed with ED provider. Management plans discussed with the patient and he is in agreement  CODE STATUS: full  Critical care TOTAL TIME TAKING CARE OF THIS PATIENT: 55 minutes.    Orlando Devereux M.D on 08/26/2018 at  5:26 PM  Between 7am to 6pm - Pager - [phone]  After 6pm go to [email or web address] - Social research officer, government  [facility]  Office  [phone]  CC: Primary care physician; Gildardo Pounds, PA

## 2018-08-26 NOTE — ED Notes (Signed)
Pt transported ot ED Main rm 17 report given to Edward Plainfield and Neta Mends. All labs ( lavender, light green. Blue and 1 set of  Cultures) sent at this time.

## 2018-08-26 NOTE — Consult Note (Signed)
PHARMACY CONSULT NOTE - FOLLOW UP  Pharmacy Consult for Electrolyte Monitoring and Replacement   Recent Labs: Potassium (mmol/L)  Date Value  08/26/2018 3.8   Magnesium (mg/dL)  Date Value  11/05/209 1.6 (L)   Calcium (mg/dL)  Date Value  17/35/6701 7.9 (L)   Calcium, Total (PTH) (mg/dL)  Date Value  41/08/129 9.4   Albumin (g/dL)  Date Value  43/88/8757 2.1 (L)   Phosphorus (mg/dL)  Date Value  97/28/2060 2.4 (L)   Sodium (mmol/L)  Date Value  08/26/2018 135     Assessment: Pharmacy has been consulted to monitor Electrolytes in this patient  Goal of Therapy:  Electrolytes wnl's  Plan:  Patient is mildly low on magnesium and will receive Magnesium 2g IV.  Will recheck potassium and magnesium with am labs (will also check phosphorous with am labs as pt was low on phosphorous in recent previous admission)  Albina Billet ,PharmD Clinical Pharmacist 08/26/2018 7:21 PM

## 2018-08-26 NOTE — Anesthesia Procedure Notes (Signed)
Procedure Name: Intubation Performed by: Lesle Reek, CRNA Pre-anesthesia Checklist: Patient identified, Emergency Drugs available, Suction available, Patient being monitored and Timeout performed Patient Re-evaluated:Patient Re-evaluated prior to induction Oxygen Delivery Method: Circle system utilized Preoxygenation: Pre-oxygenation with 100% oxygen Induction Type: IV induction Laryngoscope Size: Mac and 3 Grade View: Grade I Tube type: Oral Tube size: 7.5 mm Number of attempts: 1 Airway Equipment and Method: Stylet Placement Confirmation: ETT inserted through vocal cords under direct vision,  positive ETCO2,  CO2 detector and breath sounds checked- equal and bilateral Secured at: 22 cm Tube secured with: Tape

## 2018-08-26 NOTE — Op Note (Signed)
  PATIENT:  Larry Andrade.  PRE-OPERATIVE DIAGNOSIS:  Septic right knee  POST-OPERATIVE DIAGNOSIS:  Gout of right knee with possible septic knee  PROCEDURE:  RIGHT KNEE ARTHROSCOPIC irrigation and debridement with extensive synovectomy  SURGEON:  Thornton Park, MD  ANESTHESIA:   General  PREOPERATIVE INDICATIONS:  Larry Andrade.  58 y.o. male with a diagnosis of SEPTIC RIGHT KNEE JOINT who was brought to the operating room emergently for an arthroscopic irrigation and debridement.  The risks benefits and alternatives were discussed with the patient preoperatively including the risks of infection, bleeding, nerve injury, knee stiffness, persistent pain, and the need for further surgery. Medical  risks include DVT and pulmonary embolism, myocardial infarction, stroke, pneumonia, respiratory failure and death. The patient understood these risks and wished to proceed.  OPERATIVE FINDINGS:  tricompartmental arthritis with extensive crystal deposition throughout the synovium of the right knee  OPERATIVE PROCEDURE: Patient was met in the preoperative area. The operative extremity was signed with the word yes and my initials according the hospital's correct site of surgery protocol.  The patient was brought to the operating room where they was placed supine on the operative table. General anesthesia was administered. The patient was prepped and draped in a sterile fashion.  A timeout was performed to verify the patient's name, date of birth, medical record number, correct site of surgery correct procedure to be performed. It was also used to verify the patient received antibiotics that all appropriate instruments, and radiographic studies were available in the room. Once all in attendance were in agreement, the case began.  Proposed arthroscopy incisions were drawn out with a surgical marker. These were pre-injected with 1% lidocaine plain. An 11 blade was used to establish an inferior lateral  and inferomedial portals.  Proximately 150 cc of cloudy fluid with crystals was evacuated from the lateral portal.  This was sent for Gram stain and culture as well as cell count and crystal analysis.    The inferomedial portal was created using a 18-gauge spinal needle under direct visualization.  A full diagnostic examination of the knee was performed including the suprapatellar pouch, patellofemoral joint, medial lateral compartments as well as the medial lateral gutters, the intercondylar notch in the posterior knee.  Patient had severe tricompartmental arthritis.  Patient had degenerative fraying of both the medial and lateral meniscus which was debrided with a 4.0 mm resector shaver blade.  The anterior cruciate ligament was intact.  There was diffuse crystalline deposits throughout the synovium of the knee with chondral loose bodies.  An extensive synovectomy and removal of loose bodies was performed using 4.0 and 5.5 mm resector shaver blades and a 90 degree ArthroCare wand.  The knee was then copiously lavaged to remove as much crystalline debris as possible.   All arthroscopic instruments were removed. The two arthroscopy portals were closed with 4-0 nylon. Steri-Strips were applied along with a dry sterile and compressive dressing. A polar care was applied to the right knee to help reduce swelling.    The patient was brought to the PACU in stable condition. I was scrubbed and present for the entire case and all sharp, sponge and instrument counts were correct at the conclusion the case. I spoke with the patient's sister postoperatively to let her know the case was performed without complication and the patient was stable in the recovery room.   Timoteo Gaul, MD

## 2018-08-26 NOTE — Consult Note (Signed)
Pharmacy Antibiotic Note  Larry Andrade. is a 58 y.o. male admitted on 08/26/2018 with sepsis.  Patient has knee wound with pus.Patient also has concern for SBP. Discussed with ED physician to utilize broad spectrum antibiotics. Pharmacy has been consulted for Vancomycin + Cefepime dosing.  Plan: Ordered Cefepime 2 g IV q12h.  Patient received Vancomycin 1g IV in ED. Will order an additional 750 mg x 1 for a full load of 1750 mg  Will order Vancomycin maintenance dosing of 1000 mg IV q12h.  Kinetics Using IBW of 80 kg, Scr 1.34, and Vd 0.7 Goal AUC 400-550 Anticipated AUC 490.2   Weight: 198 lb 13.7 oz (90.2 kg)  Temp (24hrs), Avg:98.7 F (37.1 C), Min:98.7 F (37.1 C), Max:98.7 F (37.1 C)  Recent Labs  Lab 08/20/18 0454 08/21/18 0427 08/26/18 1451 08/26/18 1525  WBC 21.3* 20.7* 35.0*  --   CREATININE 1.05 1.20 1.34*  --   LATICACIDVEN  --   --   --  3.8*    Estimated Creatinine Clearance: 70.4 mL/min (A) (by C-G formula based on SCr of 1.34 mg/dL (H)).    Allergies  Allergen Reactions  . Penicillins Rash    Has patient had a PCN reaction causing immediate rash, facial/tongue/throat swelling, SOB or lightheadedness with hypotension: No Has patient had a PCN reaction causing severe rash involving mucus membranes or skin necrosis: No Has patient had a PCN reaction that required hospitalization: No Has patient had a PCN reaction occurring within the last 10 years: No If all of the above answers are "NO", then may proceed with Cephalosporin use.     Antimicrobials this admission: 2/29 Cefepime >>  2/29 Vancomycin >>   Dose adjustments this admission: N/A  Microbiology results: 2/29 BCx: pending 2/29 UCx: pending  2/29 Fluid culture pending  Thank you for allowing pharmacy to be a part of this patient's care.   Mauri Reading, PharmD Pharmacy Resident  08/26/2018 5:28 PM

## 2018-08-26 NOTE — Anesthesia Preprocedure Evaluation (Addendum)
Anesthesia Evaluation  Patient identified by MRN, date of birth, ID band Patient awake    Reviewed: Allergy & Precautions, H&P , NPO status , Patient's Chart, lab work & pertinent test results  Airway Mallampati: III       Dental  (+) Poor Dentition, Chipped, Missing   Pulmonary Current Smoker,           Cardiovascular hypertension, +CHF (Pt has h/o acute on chronic CHF, diastolic dysfunction with preserved EF, however echo 08/17/18 was normal. BNP is elevated compared to hospital discharge)       Neuro/Psych negative neurological ROS  negative psych ROS   GI/Hepatic GERD  ,(+) Cirrhosis     substance abuse  alcohol use,   Endo/Other  negative endocrine ROS  Renal/GU      Musculoskeletal   Abdominal   Peds  Hematology  (+) Blood dyscrasia, anemia ,   Anesthesia Other Findings 58 yo M discharged hospitalized one week ago for acute on chronic CHF and gout of R knee.  Presented to ED today with worsening RLE pain and edema and WBC 35, with synovial fluid showing gram positive cocci.  Inflamed joints c/w gout  Past Medical History: No date: CHF (congestive heart failure) (HCC) No date: Cirrhosis (HCC) No date: GERD (gastroesophageal reflux disease) No date: Gout No date: Hypertension No date: Pancreatitis  Past Surgical History: No date: CARDIAC CATHETERIZATION No date: COLON SURGERY 10/26/2017: ESOPHAGOGASTRODUODENOSCOPY (EGD) WITH PROPOFOL; N/A     Comment:  Procedure: ESOPHAGOGASTRODUODENOSCOPY (EGD) WITH               PROPOFOL;  Surgeon: [name], MD;  Location: [facility]               ENDOSCOPY;  Service: Endoscopy;  Laterality: N/A;  BMI    Body Mass Index:  26.24 kg/m      Reproductive/Obstetrics negative OB ROS                        Anesthesia Physical Anesthesia Plan  ASA: IV and emergent  Anesthesia Plan: General ETT   Post-op Pain Management:    Induction: Rapid  sequence, Intravenous and Cricoid pressure planned  PONV Risk Score and Plan: Ondansetron, Dexamethasone, Midazolam and Treatment may vary due to age or medical condition  Airway Management Planned:   Additional Equipment:   Intra-op Plan:   Post-operative Plan:   Informed Consent: I have reviewed the patients History and Physical, chart, labs and discussed the procedure including the risks, benefits and alternatives for the proposed anesthesia with the patient or authorized representative who has indicated his/her understanding and acceptance.     Dental Advisory Given  Plan Discussed with: Anesthesiologist and CRNA  Anesthesia Plan Comments:        Anesthesia Quick Evaluation

## 2018-08-26 NOTE — Consult Note (Signed)
ORTHOPAEDIC CONSULTATION  REQUESTING PHYSICIAN: Delfino Lovett, MD  Chief Complaint: Right knee pain and swelling  HPI: Larry Marsico. is a 58 y.o. male with multiple medical comorbidities including history of gouty arthritis presents to the ER today with increased pain and swelling in the right knee.  Patient was recently discharged from the hospital.  During his last hospitalization he was seen by Dr. Tamera Punt for presumed gout in his knees.  Patient was given corticosteroid injections in both knees.  Patient explains that the day after he returned home he experienced increased swelling in the right knee.  This continued to progress throughout the week and this morning he almost fell getting out of bed due to his knee pain and swelling.  He was brought to the [facility] emergency department.  The emergency physician aspirated the right knee.   The aspirate demonstrates nearly 200,000 white cells and gram-positive cocci on the Gram stain. Patient has a white blood cell count of 35 and sed rate is 127.  Patient is afebrile and denies fever or chills at home.  Patient has no other joint pain other than the right knee.  Past Medical History:  Diagnosis Date  . CHF (congestive heart failure) (HCC)   . Cirrhosis (HCC)   . GERD (gastroesophageal reflux disease)   . Gout   . Hypertension   . Pancreatitis    Past Surgical History:  Procedure Laterality Date  . CARDIAC CATHETERIZATION    . COLON SURGERY    . ESOPHAGOGASTRODUODENOSCOPY (EGD) WITH PROPOFOL N/A 10/26/2017   Procedure: ESOPHAGOGASTRODUODENOSCOPY (EGD) WITH PROPOFOL;  Surgeon: [name], MD;  Location: [facility] ENDOSCOPY;  Service: Endoscopy;  Laterality: N/A;   Social History   Socioeconomic History  . Marital status: Single    Spouse name: Not on file  . Number of children: Not on file  . Years of education: Not on file  . Highest education level: Not on file  Occupational History  . Not on file  Social  Needs  . Financial resource strain: Not on file  . Food insecurity:    Worry: Not on file    Inability: Not on file  . Transportation needs:    Medical: Not on file    Non-medical: Not on file  Tobacco Use  . Smoking status: Current Every Day Smoker  . Smokeless tobacco: Never Used  Substance and Sexual Activity  . Alcohol use: Yes    Comment: 1-2 times weekly  . Drug use: No  . Sexual activity: Not on file  Lifestyle  . Physical activity:    Days per week: Not on file    Minutes per session: Not on file  . Stress: Not on file  Relationships  . Social connections:    Talks on phone: Not on file    Gets together: Not on file    Attends religious service: Not on file    Active member of club or organization: Not on file    Attends meetings of clubs or organizations: Not on file    Relationship status: Not on file  Other Topics Concern  . Not on file  Social History Narrative  . Not on file   Family History  Problem Relation Age of Onset  . CVA Mother   . CAD Mother   . CAD Father    Allergies  Allergen Reactions  . Penicillins Rash    Has patient had a PCN reaction causing immediate rash, facial/tongue/throat swelling, SOB or lightheadedness  with hypotension: No Has patient had a PCN reaction causing severe rash involving mucus membranes or skin necrosis: No Has patient had a PCN reaction that required hospitalization: No Has patient had a PCN reaction occurring within the last 10 years: No If all of the above answers are "NO", then may proceed with Cephalosporin use.    Prior to Admission medications   Medication Sig Start Date End Date Taking? Authorizing Provider  albuterol (PROVENTIL) (2.5 MG/3ML) 0.083% nebulizer solution Take 2.5 mg by nebulization every 6 (six) hours as needed for wheezing or shortness of breath.   Yes [provider]  allopurinol (ZYLOPRIM) 100 MG tablet Take 1 tablet (100 mg total) by mouth daily. 02/25/17  Yes Enid Baas,  MD  colchicine 0.6 MG tablet Take 1 tablet (0.6 mg total) by mouth daily. 08/21/18  Yes Shaune Pollack, MD  folic acid (FOLVITE) 1 MG tablet Take 1 tablet (1 mg total) by mouth daily. 08/21/18  Yes Shaune Pollack, MD  metoprolol tartrate (LOPRESSOR) 25 MG tablet Take 25 mg by mouth 2 (two) times daily.   Yes [provider]  Multiple Vitamin (MULTIVITAMIN WITH MINERALS) TABS tablet Take 1 tablet by mouth daily. 10/26/17  Yes Wieting, Richard, MD  oxyCODONE (OXY IR/ROXICODONE) 5 MG immediate release tablet Take 5 mg by mouth every 4 (four) hours as needed for severe pain.    Yes [provider]  vitamin B-12 1000 MCG tablet Take 1 tablet (1,000 mcg total) by mouth daily. 08/21/18  Yes Shaune Pollack, MD   Dg Knee 1-2 Views Right  Result Date: 08/26/2018 CLINICAL DATA:  Knee pain.  No known injury.  Initial encounter. EXAM: RIGHT KNEE - 1-2 VIEW COMPARISON:  None. FINDINGS: A moderate to large knee effusion is noted. Tricompartmental degenerative changes are present. No acute fracture or dislocation. IMPRESSION: Moderate to large knee effusion without acute bony abnormality. Tricompartment degenerative changes. Electronically Signed   By: Harmon Pier M.D.   On: 08/26/2018 18:50   Dg Chest Port 1 View  Result Date: 08/26/2018 CLINICAL DATA:  Sepsis EXAM: PORTABLE CHEST 1 VIEW COMPARISON:  08/16/2018 chest radiograph FINDINGS: The cardiomediastinal silhouette is unremarkable. There is no evidence of focal airspace disease, pulmonary edema, suspicious pulmonary nodule/mass, pleural effusion, or pneumothorax. No acute bony abnormalities are identified. Remote RIGHT rib fractures. IMPRESSION: No active disease. Electronically Signed   By: Harmon Pier M.D.   On: 08/26/2018 16:41    Positive ROS: All other systems have been reviewed and were otherwise negative with the exception of those mentioned in the HPI and as above.  Physical Exam: General: Alert, no acute distress Psychiatric: Patient is  competent for consent with normal mood and affect  MUSCULOSKELETAL: Right knee: Patient has a gauze bandage over the lateral aspect of the knee from his previous aspiration in the ER today.  Patient has a large right knee effusion without obvious ecchymosis or erythema.  Patient has generalized right lower extremity edema without calf tenderness.  His foot appears well perfused despite not being able to palpate pedal pulses due to his edema.  Patient has diminished sensation to light touch in both feet which is his baseline due to neuropathy.  Assessment: Right septic knee joint   Plan: Patient has a large effusion with 200,000 white cells in his aspirate and gram-positive cocci on Gram stain today.  Patient has a clinical right septic knee joint.  There is no other joint involvement at this time.  Patient is n.p.o.  He  is received vancomycin and cefepime.  I am recommending arthroscopic right knee irrigation and debridement to treat his infection.  Patient understood and agreed with this plan.    Juanell Fairly, MD    08/26/2018 7:04 PM

## 2018-08-26 NOTE — ED Provider Notes (Addendum)
[facility] Emergency Department Provider Note  ____________________________________________   I have reviewed the triage vital signs and the nursing notes. Where available I have reviewed prior notes and, if possible and indicated, outside hospital notes.    HISTORY  Chief Complaint Knee Pain    HPI Larry Andrade. is a 58 y.o. male who presents today complaining of increased pain in his leg and feeling generally somewhat weak.  Patient had a very recent complicated hospital stay, at which time it was noted that he had CHF, however it was mostly thought to be secondary to low intravascular protein levels he was given albumin, he had acute kidney injury, he has a chronically elevated lactic, he did have his joints tapped including his knees bilaterally, during his last hospital stay which time it was thought that he had a gout flare, cultures were negative.  Patient states after going home his right knee "blew up" and has been unable to walk.  He denies any fever or chills.  He has a chronic cough he states might be slightly worse.  He denies any vomiting nausea or diarrhea he is not eating as much.  And his complaint is that his right knee gets more more swollen and bigger as the days go by.   No trauma no other complaints   Past Medical History:  Diagnosis Date  . CHF (congestive heart failure) (HCC)   . Cirrhosis (HCC)   . GERD (gastroesophageal reflux disease)   . Gout   . Hypertension   . Pancreatitis     Patient Active Problem List   Diagnosis Date Noted  . Acute on chronic systolic CHF (congestive heart failure) (HCC) 08/17/2018  . Lactic acid acidosis 10/25/2017  . Generalized abdominal pain   . Hematemesis with nausea   . Acute posthemorrhagic anemia   . Gout attack 02/22/2017    Past Surgical History:  Procedure Laterality Date  . CARDIAC CATHETERIZATION    . COLON SURGERY    . ESOPHAGOGASTRODUODENOSCOPY (EGD) WITH PROPOFOL N/A 10/26/2017    Procedure: ESOPHAGOGASTRODUODENOSCOPY (EGD) WITH PROPOFOL;  Surgeon: [name], MD;  Location: [facility] ENDOSCOPY;  Service: Endoscopy;  Laterality: N/A;    Prior to Admission medications   Medication Sig Start Date End Date Taking? Authorizing Provider  albuterol (PROVENTIL) (2.5 MG/3ML) 0.083% nebulizer solution Take 2.5 mg by nebulization every 6 (six) hours as needed for wheezing or shortness of breath.    [provider]  allopurinol (ZYLOPRIM) 100 MG tablet Take 1 tablet (100 mg total) by mouth daily. 02/25/17   Enid Baas, MD  colchicine 0.6 MG tablet Take 1 tablet (0.6 mg total) by mouth daily. 08/21/18   Shaune Pollack, MD  folic acid (FOLVITE) 1 MG tablet Take 1 tablet (1 mg total) by mouth daily. 08/21/18   Shaune Pollack, MD  furosemide (LASIX) 20 MG tablet Take 1 tablet (20 mg total) by mouth 2 (two) times daily. 08/21/18   Shaune Pollack, MD  guaiFENesin-dextromethorphan [facility] DM) 100-10 MG/5ML syrup Take 5 mLs by mouth every 4 (four) hours as needed for cough (chest congestion). 08/21/18   Shaune Pollack, MD  metoprolol tartrate (LOPRESSOR) 25 MG tablet Take 25 mg by mouth 2 (two) times daily.    [provider]  Multiple Vitamin (MULTIVITAMIN WITH MINERALS) TABS tablet Take 1 tablet by mouth daily. 10/26/17   Alford Highland, MD  oxyCODONE (OXY IR/ROXICODONE) 5 MG immediate release tablet Take 5 mg by mouth every 6 (six) hours as needed for  severe pain.    [provider]  pantoprazole (PROTONIX) 40 MG tablet Take 1 tablet (40 mg total) by mouth daily. 10/26/17   Alford HighlandWieting, Richard, MD  predniSONE (DELTASONE) 50 MG tablet Take 1 tablet (50 mg total) by mouth daily with breakfast. 08/21/18   Shaune Pollackhen, Qing, MD  tamsulosin (FLOMAX) 0.4 MG CAPS capsule Take 1 capsule (0.4 mg total) by mouth daily. 08/21/18   Shaune Pollackhen, Qing, MD  vitamin B-12 1000 MCG tablet Take 1 tablet (1,000 mcg total) by mouth daily. 08/21/18   Shaune Pollackhen, Qing, MD    Allergies Penicillins  Family History   Problem Relation Age of Onset  . CVA Mother   . CAD Mother   . CAD Father     Social History Social History   Tobacco Use  . Smoking status: Current Every Day Smoker  . Smokeless tobacco: Never Used  Substance Use Topics  . Alcohol use: Yes    Comment: 1-2 times weekly  . Drug use: No    Review of Systems Constitutional: No fever/chills Eyes: No visual changes. ENT: No sore throat. No stiff neck no neck pain Cardiovascular: Denies chest pain. Respiratory: Denies shortness of breath. Gastrointestinal:   no vomiting.  No diarrhea.  No constipation. Genitourinary: Negative for dysuria. Musculoskeletal: + lower extremity swelling Skin: Negative for rash. Neurological: Negative for severe headaches, focal weakness or numbness.   ____________________________________________   PHYSICAL EXAM:  VITAL SIGNS: ED Triage Vitals  Enc Vitals Group     BP 08/26/18 1446 133/76     Pulse Rate 08/26/18 1446 (!) 126     Resp 08/26/18 1446 19     Temp 08/26/18 1446 98.7 F (37.1 C)     Temp Source 08/26/18 1446 Oral     SpO2 08/26/18 1446 99 %     Weight 08/26/18 1448 198 lb 13.7 oz (90.2 kg)     Height --      Head Circumference --      Peak Flow --      Pain Score 08/26/18 1447 10     Pain Loc --      Pain Edu? --      Excl. in GC? --     Constitutional: Alert and oriented.  No acute medical distress, chronically ill-appearing Eyes: Conjunctivae are normal Head: Atraumatic HEENT: No congestion/rhinnorhea. Mucous membranes are moist.  Oropharynx non-erythematous Neck:   Nontender with no meningismus, no masses, no stridor Cardiovascular: Tachycardia noted, regular rhythm. Grossly normal heart sounds.  Good peripheral circulation. Respiratory: Normal respiratory effort.  No retractions. Lungs CTAB. Abdominal: Soft and nontender. No distention. No guarding no rebound Back:  There is no focal tenderness or step off.  there is no midline tenderness there are no lesions  noted. there is no CVA tenderness Musculoskeletal: He has very significant swelling to the right knee it is not but it is very hot to the touch.  There is also some swelling going down that leg more distally.  He has bilateral lower extremity edema more on the right than left, most of this appears to be chronic edema no joint effusions, no DVT signs strong distal pulses no edema Neurologic:  Normal speech and language. No gross focal neurologic deficits are appreciated.  Skin:  Skin is warm, dry and intact. No rash noted. Psychiatric: Mood and affect are normal. Speech and behavior are normal.  ____________________________________________   LABS (all labs ordered are listed, but only abnormal results are displayed)  Labs Reviewed  CBC  WITH DIFFERENTIAL/PLATELET - Abnormal; Notable for the following components:      Result Value   WBC 35.0 (*)    RBC 3.18 (*)    Hemoglobin 10.7 (*)    HCT 33.3 (*)    MCV 104.7 (*)    RDW 20.1 (*)    Neutro Abs 31.1 (*)    Monocytes Absolute 1.2 (*)    Abs Immature Granulocytes 0.66 (*)    All other components within normal limits  COMPREHENSIVE METABOLIC PANEL - Abnormal; Notable for the following components:   CO2 21 (*)    BUN 26 (*)    Creatinine, Ser 1.34 (*)    Calcium 7.9 (*)    Total Protein 6.3 (*)    Albumin 2.1 (*)    Alkaline Phosphatase 225 (*)    Total Bilirubin 4.9 (*)    GFR calc non Af Amer 59 (*)    Anion gap 16 (*)    All other components within normal limits  LACTIC ACID, PLASMA - Abnormal; Notable for the following components:   Lactic Acid, Venous 3.8 (*)    All other components within normal limits  MAGNESIUM - Abnormal; Notable for the following components:   Magnesium 1.6 (*)    All other components within normal limits  PROTIME-INR - Abnormal; Notable for the following components:   Prothrombin Time 16.4 (*)    INR 1.3 (*)    All other components within normal limits  BRAIN NATRIURETIC PEPTIDE - Abnormal; Notable  for the following components:   B Natriuretic Peptide 361.0 (*)    All other components within normal limits  CULTURE, BLOOD (ROUTINE X 2)  CULTURE, BLOOD (ROUTINE X 2)  CULTURE, BLOOD (ROUTINE X 2)  CULTURE, BLOOD (ROUTINE X 2)  URINE CULTURE  BODY FLUID CULTURE  GRAM STAIN  LACTIC ACID, PLASMA  URINALYSIS, COMPLETE (UACMP) WITH MICROSCOPIC  INFLUENZA PANEL BY PCR (TYPE A & B)  GLUCOSE, BODY FLUID OTHER  PROTEIN, BODY FLUID (OTHER)  SYNOVIAL CELL COUNT + DIFF, W/ CRYSTALS  TROPONIN I    Pertinent labs  results that were available during my care of the patient were reviewed by me and considered in my medical decision making (see chart for details). ____________________________________________  EKG  I personally interpreted any EKGs ordered by me or triage Sinus tach rate 125, intraventricular conduction delay consistent with right bundle branch block noted no acute ST elevation or depression  ____________________________________________  RADIOLOGY  Pertinent labs & imaging results that were available during my care of the patient were reviewed by me and considered in my medical decision making (see chart for details). If possible, patient and/or family made aware of any abnormal findings.  No results found. ____________________________________________    PROCEDURES  Procedure(s) performed: None  .Joint Aspiration/Arthrocentesis Date/Time: 08/26/2018 4:46 PM Performed by: Jeanmarie Plant, MD Authorized by: Jeanmarie Plant, MD   Consent:    Consent obtained:  Verbal and written   Consent given by:  Patient   Risks discussed:  Bleeding, infection, pain, nerve damage and incomplete drainage   Alternatives discussed:  No treatment, delayed treatment, alternative treatment and observation Location:    Location:  Knee   Knee:  R knee Anesthesia (see MAR for exact dosages):    Anesthesia method:  Local infiltration   Local anesthetic:  Lidocaine 1% WITH  epi Procedure details:    Preparation: Patient was prepped and draped in usual sterile fashion     Needle gauge:  18 G  Ultrasound guidance: no     Approach:  Lateral   Aspirate amount:  25   Aspirate characteristics:  Purulent   Steroid injected: no     Specimen collected: yes   Post-procedure details:    Dressing:  Sterile dressing   Patient tolerance of procedure:  Tolerated well, no immediate complications    Critical Care performed: CRITICAL CARE Performed by: Jeanmarie Plant   Total critical care time: 45 minutes  Critical care time was exclusive of separately billable procedures and treating other patients.  Critical care was necessary to treat or prevent imminent or life-threatening deterioration.  Critical care was time spent personally by me on the following activities: development of treatment plan with patient and/or surrogate as well as nursing, discussions with consultants, evaluation of patient's response to treatment, examination of patient, obtaining history from patient or surrogate, ordering and performing treatments and interventions, ordering and review of laboratory studies, ordering and review of radiographic studies, pulse oximetry and re-evaluation of patient's condition.   ____________________________________________   INITIAL IMPRESSION / ASSESSMENT AND PLAN / ED COURSE  Pertinent labs & imaging results that were available during my care of the patient were reviewed by me and considered in my medical decision making (see chart for details).  Patient here with elevated white count increased pain and swelling to the right knee, also has a cough.  Abdomen is benign no history of ascites on recent ultrasound to suggest SBP.  We are giving him broad-spectrum antibiotics.  He thinks he had a rash to penicillin as a child but has had no reaction since that time and no history of cephalosporin allergy.  We are after consultation with the pharmacy go to treat  him with broad-spectrum antibiotics.  He is a little bit tachycardic but he is also somewhat anxious and it is difficult to tease out exactly why his heart rate is up.  We do not want to be too aggressive with his fluids because I think it will just third space at all, his lactic acid is elevated but it is chronically elevated.  Hard to tease out whether aseptic.  My concern is for the joint, I have aspirated the joint.  What came out was a brownish off-white very thick fluid.  I did send it for culture.  Patient did give informed consent prior to this procedure.  He is receiving ginger IV fluids as we do not wish to overload him given his history of CHF and intravascular depletion and we will admit him.  ----------------------------------------- 5:39 PM on 08/26/2018 -----------------------------------------  Talk to Dr. Martha Clan, expressed what we think is likely a septic joint.  He will coordinate with the hospitalist service about when the patient is stable for operation he does agree with all my managed thus far.  Talk to Dr. Tildon Husky, of the hospitalist service will admit the patient under her service.  She had all of the consults.  We are giving patient IV fluids.   ____________________________________________   FINAL CLINICAL IMPRESSION(S) / ED DIAGNOSES  Final diagnoses:  Sepsis (HCC)      This chart was dictated using voice recognition software.  Despite best efforts to proofread,  errors can occur which can change meaning.      Jeanmarie Plant, MD 08/26/18 1649    Jeanmarie Plant, MD 08/26/18 1649    Jeanmarie Plant, MD 08/26/18 1652    Jeanmarie Plant, MD 08/26/18 1739

## 2018-08-26 NOTE — Anesthesia Post-op Follow-up Note (Signed)
Anesthesia QCDR form completed.        

## 2018-08-26 NOTE — ED Triage Notes (Addendum)
Pt arrives via [facility] for knee pain pt states pain has been going on for a few days now. Pt was seen here and d/c on 08/21/18. Pt is A/O pt appears with  jaundice color skin. Pt states he is normally ambulatory and is unable to walk at this time. Pt states his knee is way bigger then what it was at d/c. Pt presents with +3 pitting Edema on the RLE

## 2018-08-26 NOTE — ED Notes (Signed)
Pt moved to Ed treatment room by this writer, EDT Lorel Monaco. @ pt bedside & Seward Speck of pt's arrival

## 2018-08-27 ENCOUNTER — Inpatient Hospital Stay: Payer: Medicare Other

## 2018-08-27 LAB — BLOOD CULTURE ID PANEL (REFLEXED)
Acinetobacter baumannii: NOT DETECTED
Candida albicans: NOT DETECTED
Candida glabrata: NOT DETECTED
Candida krusei: NOT DETECTED
Candida parapsilosis: NOT DETECTED
Candida tropicalis: NOT DETECTED
ENTEROCOCCUS SPECIES: NOT DETECTED
Enterobacter cloacae complex: NOT DETECTED
Enterobacteriaceae species: NOT DETECTED
Escherichia coli: NOT DETECTED
Haemophilus influenzae: NOT DETECTED
Klebsiella oxytoca: NOT DETECTED
Klebsiella pneumoniae: NOT DETECTED
LISTERIA MONOCYTOGENES: NOT DETECTED
Methicillin resistance: DETECTED — AB
Neisseria meningitidis: NOT DETECTED
Proteus species: NOT DETECTED
Pseudomonas aeruginosa: NOT DETECTED
STREPTOCOCCUS AGALACTIAE: NOT DETECTED
Serratia marcescens: NOT DETECTED
Staphylococcus aureus (BCID): DETECTED — AB
Staphylococcus species: DETECTED — AB
Streptococcus pneumoniae: NOT DETECTED
Streptococcus pyogenes: NOT DETECTED
Streptococcus species: NOT DETECTED

## 2018-08-27 LAB — SYNOVIAL CELL COUNT + DIFF, W/ CRYSTALS
Eosinophils-Synovial: 0 %
Lymphocytes-Synovial Fld: 7 %
Monocyte-Macrophage-Synovial Fluid: 3 %
Neutrophil, Synovial: 90 %
WBC, SYNOVIAL: 102683 /mm3 — AB (ref 0–200)

## 2018-08-27 LAB — COMPREHENSIVE METABOLIC PANEL
ALT: 13 U/L (ref 0–44)
AST: 23 U/L (ref 15–41)
Albumin: 1.7 g/dL — ABNORMAL LOW (ref 3.5–5.0)
Alkaline Phosphatase: 160 U/L — ABNORMAL HIGH (ref 38–126)
Anion gap: 11 (ref 5–15)
BILIRUBIN TOTAL: 5 mg/dL — AB (ref 0.3–1.2)
BUN: 27 mg/dL — ABNORMAL HIGH (ref 6–20)
CALCIUM: 7.1 mg/dL — AB (ref 8.9–10.3)
CO2: 23 mmol/L (ref 22–32)
Chloride: 102 mmol/L (ref 98–111)
Creatinine, Ser: 1.32 mg/dL — ABNORMAL HIGH (ref 0.61–1.24)
GFR calc Af Amer: >60 mL/min (ref >60)
GFR calc non Af Amer: >60 mL/min (ref >60)
Glucose, Bld: 117 mg/dL — ABNORMAL HIGH (ref 70–99)
Potassium: 3.8 mmol/L (ref 3.5–5.1)
Sodium: 136 mmol/L (ref 135–145)
TOTAL PROTEIN: 4.8 g/dL — AB (ref 6.5–8.1)

## 2018-08-27 LAB — CBC
HCT: 22.9 % — ABNORMAL LOW (ref 39.0–52.0)
HCT: 29 % — ABNORMAL LOW (ref 39.0–52.0)
Hemoglobin: 7.4 g/dL — ABNORMAL LOW (ref 13.0–17.0)
Hemoglobin: 9.5 g/dL — ABNORMAL LOW (ref 13.0–17.0)
MCH: 33.8 pg (ref 26.0–34.0)
MCH: 32.5 pg (ref 26.0–34.0)
MCHC: 32.3 g/dL (ref 30.0–36.0)
MCHC: 32.8 g/dL (ref 30.0–36.0)
MCV: 104.6 fL — ABNORMAL HIGH (ref 80.0–100.0)
MCV: 99.3 fL (ref 80.0–100.0)
Platelets: 207 10*3/uL (ref 150–400)
Platelets: 228 10*3/uL (ref 150–400)
RBC: 2.19 MIL/uL — ABNORMAL LOW (ref 4.22–5.81)
RBC: 2.92 MIL/uL — AB (ref 4.22–5.81)
RDW: 20.4 % — AB (ref 11.5–15.5)
RDW: 22.1 % — ABNORMAL HIGH (ref 11.5–15.5)
WBC: 27.4 10*3/uL — ABNORMAL HIGH (ref 4.0–10.5)
WBC: 25.3 10*3/uL — ABNORMAL HIGH (ref 4.0–10.5)
nRBC: 0 % (ref 0.0–0.2)
nRBC: 0 % (ref 0.0–0.2)

## 2018-08-27 LAB — VITAMIN B12: Vitamin B-12: 861 pg/mL (ref 180–914)

## 2018-08-27 LAB — LACTIC ACID, PLASMA
LACTIC ACID, VENOUS: 2.3 mmol/L — AB (ref 0.5–1.9)
Lactic Acid, Venous: 2.5 mmol/L (ref 0.5–1.9)

## 2018-08-27 LAB — PROTIME-INR
INR: 1.4 — AB (ref 0.8–1.2)
Prothrombin Time: 17.1 seconds — ABNORMAL HIGH (ref 11.4–15.2)

## 2018-08-27 LAB — FOLATE: FOLATE: 7.1 ng/mL (ref >5.9)

## 2018-08-27 LAB — PREPARE RBC (CROSSMATCH)

## 2018-08-27 LAB — C-REACTIVE PROTEIN: CRP: 28.5 mg/dL — ABNORMAL HIGH (ref <1.0)

## 2018-08-27 LAB — FIBRINOGEN: Fibrinogen: 700 mg/dL — ABNORMAL HIGH (ref 210–475)

## 2018-08-27 LAB — PHOSPHORUS: Phosphorus: 5.1 mg/dL — ABNORMAL HIGH (ref 2.5–4.6)

## 2018-08-27 LAB — MAGNESIUM: Magnesium: 1.5 mg/dL — ABNORMAL LOW (ref 1.7–2.4)

## 2018-08-27 LAB — APTT: aPTT: 41 seconds — ABNORMAL HIGH (ref 24–36)

## 2018-08-27 MED ORDER — MORPHINE SULFATE (PF) 2 MG/ML IV SOLN
0.5000 mg | INTRAVENOUS | Status: DC | PRN
  Administered 2018-09-01 (×2): 1 mg via INTRAVENOUS
  Filled 2018-08-27 (×2): qty 1

## 2018-08-27 MED ORDER — HYDROCODONE-ACETAMINOPHEN 7.5-325 MG PO TABS
1.0000 | ORAL_TABLET | ORAL | Status: DC | PRN
  Administered 2018-08-31 – 2018-09-04 (×8): 2 via ORAL
  Administered 2018-09-06: 1 via ORAL
  Administered 2018-09-06 (×3): 2 via ORAL
  Administered 2018-09-06: 1 via ORAL
  Administered 2018-09-07 (×3): 2 via ORAL
  Filled 2018-08-27 (×4): qty 2
  Filled 2018-08-27 (×2): qty 1
  Filled 2018-08-27 (×11): qty 2

## 2018-08-27 MED ORDER — SODIUM CHLORIDE 0.9% IV SOLUTION
Freq: Once | INTRAVENOUS | Status: AC
  Administered 2018-09-05: 20:00:00 via INTRAVENOUS

## 2018-08-27 MED ORDER — CALCIUM CARBONATE ANTACID 500 MG PO CHEW
2.0000 | CHEWABLE_TABLET | Freq: Once | ORAL | Status: AC
  Administered 2018-08-27: 400 mg via ORAL
  Filled 2018-08-27: qty 2

## 2018-08-27 MED ORDER — METHOCARBAMOL 1000 MG/10ML IJ SOLN
500.0000 mg | Freq: Four times a day (QID) | INTRAVENOUS | Status: DC | PRN
  Filled 2018-08-27: qty 5

## 2018-08-27 MED ORDER — METHOCARBAMOL 500 MG PO TABS
500.0000 mg | ORAL_TABLET | Freq: Four times a day (QID) | ORAL | Status: DC | PRN
  Filled 2018-08-27: qty 1

## 2018-08-27 MED ORDER — PHENOL 1.4 % MT LIQD
1.0000 | OROMUCOSAL | Status: DC | PRN
  Filled 2018-08-27: qty 177

## 2018-08-27 MED ORDER — ENOXAPARIN SODIUM 40 MG/0.4ML ~~LOC~~ SOLN
40.0000 mg | SUBCUTANEOUS | Status: DC
  Administered 2018-08-27 – 2018-09-04 (×9): 40 mg via SUBCUTANEOUS
  Filled 2018-08-27 (×9): qty 0.4

## 2018-08-27 MED ORDER — METOCLOPRAMIDE HCL 5 MG/ML IJ SOLN: 5.0000 mg | Freq: Three times a day (TID) | INTRAMUSCULAR | Status: DC | PRN

## 2018-08-27 MED ORDER — VANCOMYCIN HCL IN DEXTROSE 1-5 GM/200ML-% IV SOLN
1000.0000 mg | Freq: Two times a day (BID) | INTRAVENOUS | Status: DC
  Administered 2018-08-27 – 2018-08-29 (×5): 1000 mg via INTRAVENOUS
  Filled 2018-08-27 (×6): qty 200

## 2018-08-27 MED ORDER — MENTHOL 3 MG MT LOZG
1.0000 | LOZENGE | OROMUCOSAL | Status: DC | PRN
  Filled 2018-08-27: qty 9

## 2018-08-27 MED ORDER — MAGNESIUM SULFATE 4 GM/100ML IV SOLN
4.0000 g | Freq: Once | INTRAVENOUS | Status: AC
  Administered 2018-08-27: 4 g via INTRAVENOUS
  Filled 2018-08-27: qty 100

## 2018-08-27 MED ORDER — DOCUSATE SODIUM 100 MG PO CAPS
100.0000 mg | ORAL_CAPSULE | Freq: Two times a day (BID) | ORAL | Status: DC
  Administered 2018-08-29 – 2018-09-06 (×14): 100 mg via ORAL
  Filled 2018-08-27 (×20): qty 1

## 2018-08-27 MED ORDER — ALUM & MAG HYDROXIDE-SIMETH 200-200-20 MG/5ML PO SUSP
30.0000 mL | ORAL | Status: DC | PRN
  Administered 2018-08-27 – 2018-09-07 (×3): 30 mL via ORAL
  Filled 2018-08-27 (×4): qty 30

## 2018-08-27 MED ORDER — ONDANSETRON HCL 4 MG/2ML IJ SOLN
4.0000 mg | Freq: Four times a day (QID) | INTRAMUSCULAR | Status: DC | PRN
  Administered 2018-09-05: 4 mg via INTRAVENOUS

## 2018-08-27 MED ORDER — POTASSIUM CHLORIDE IN NACL 20-0.9 MEQ/L-% IV SOLN
INTRAVENOUS | Status: DC
  Administered 2018-08-27 – 2018-08-28 (×3): via INTRAVENOUS
  Filled 2018-08-27 (×6): qty 1000

## 2018-08-27 MED ORDER — METOCLOPRAMIDE HCL 10 MG PO TABS
5.0000 mg | ORAL_TABLET | Freq: Three times a day (TID) | ORAL | Status: DC | PRN
  Filled 2018-08-27: qty 1

## 2018-08-27 MED ORDER — HYDROCODONE-ACETAMINOPHEN 5-325 MG PO TABS
1.0000 | ORAL_TABLET | ORAL | Status: DC | PRN
  Administered 2018-08-27: 2 via ORAL
  Administered 2018-08-27: 1 via ORAL
  Administered 2018-08-27 – 2018-09-03 (×20): 2 via ORAL
  Administered 2018-09-04: 1 via ORAL
  Administered 2018-09-04 – 2018-09-08 (×7): 2 via ORAL
  Filled 2018-08-27 (×9): qty 2
  Filled 2018-08-27: qty 1
  Filled 2018-08-27 (×20): qty 2
  Filled 2018-08-27: qty 1

## 2018-08-27 MED ORDER — ONDANSETRON HCL 4 MG PO TABS: 4.0000 mg | ORAL_TABLET | Freq: Four times a day (QID) | ORAL | Status: DC | PRN

## 2018-08-27 MED ORDER — MAGNESIUM CITRATE PO SOLN
1.0000 | Freq: Once | ORAL | Status: DC | PRN
  Filled 2018-08-27: qty 296

## 2018-08-27 NOTE — Progress Notes (Signed)
Hemoglobin 7.4 this Am. Luther Parody, Np made aware. No new orders received at this time.

## 2018-08-27 NOTE — Progress Notes (Signed)
Patient complaining of acid reflux. MD notified. Orders received.

## 2018-08-27 NOTE — Progress Notes (Signed)
PT Cancellation Note  Patient Details Name: Larry Andrade. MRN: [id number] DOB: March 17, 1961   Cancelled Treatment:    Reason Eval/Treat Not Completed: Pain limiting ability to participate. Attempted to see pt again ~30 minutes after pain medication administered per RN.  Upon entrance pt politely declined any physical therapy this date, including bed level activity, despite encouragement provided. SPT provided the option to pt if he feels up to therapy later to notify his RN. Will continue to follow acutely.    Emeline Gins, SPT  08/27/2018, 11:48 AM

## 2018-08-27 NOTE — Progress Notes (Signed)
PHARMACY - PHYSICIAN COMMUNICATION CRITICAL VALUE ALERT - BLOOD CULTURE IDENTIFICATION (BCID)  Larry Andrade. is an 58 y.o. male who presented to [facility] on 08/26/2018 with a chief complaint of septic knee s/p I/D POD 1  Assessment:  Tachycardic, WBC 27.4, kneeXR showing effusions, 2/4 GPC BCID staph aureus mec A + (MRSA)  Name of physician (or Provider) Contacted: Barbaraann Rondo  Current antibiotics: Vanc, cefepime  Changes to prescribed antibiotics recommended:  Provider notified about patient's condition and notified that ID was consulted; recommended to discontinue cefepime, hospitalist in agreeent; cefepime discontinued  Results for orders placed or performed during the hospital encounter of 08/26/18  Blood Culture ID Panel (Reflexed) (Collected: 08/26/2018  3:30 PM)  Result Value Ref Range   Enterococcus species NOT DETECTED NOT DETECTED   Listeria monocytogenes NOT DETECTED NOT DETECTED   Staphylococcus species DETECTED (A) NOT DETECTED   Staphylococcus aureus (BCID) DETECTED (A) NOT DETECTED   Methicillin resistance DETECTED (A) NOT DETECTED   Streptococcus species NOT DETECTED NOT DETECTED   Streptococcus agalactiae NOT DETECTED NOT DETECTED   Streptococcus pneumoniae NOT DETECTED NOT DETECTED   Streptococcus pyogenes NOT DETECTED NOT DETECTED   Acinetobacter baumannii NOT DETECTED NOT DETECTED   Enterobacteriaceae species NOT DETECTED NOT DETECTED   Enterobacter cloacae complex NOT DETECTED NOT DETECTED   Escherichia coli NOT DETECTED NOT DETECTED   Klebsiella oxytoca NOT DETECTED NOT DETECTED   Klebsiella pneumoniae NOT DETECTED NOT DETECTED   Proteus species NOT DETECTED NOT DETECTED   Serratia marcescens NOT DETECTED NOT DETECTED   Haemophilus influenzae NOT DETECTED NOT DETECTED   Neisseria meningitidis NOT DETECTED NOT DETECTED   Pseudomonas aeruginosa NOT DETECTED NOT DETECTED   Candida albicans NOT DETECTED NOT DETECTED   Candida glabrata NOT DETECTED NOT  DETECTED   Candida krusei NOT DETECTED NOT DETECTED   Candida parapsilosis NOT DETECTED NOT DETECTED   Candida tropicalis NOT DETECTED NOT DETECTED   [name], PharmD, BCPS Clinical Pharmacist 08/27/2018

## 2018-08-27 NOTE — [place] FL2 (Signed)
[place] MEDICAID FL2 LEVEL OF CARE SCREENING TOOL     IDENTIFICATION  Patient Name: Larry Andrade. Birthdate: Mar 22, 1961 Sex: male Admission Date (Current Location): 08/26/2018  [place] and [place] Number:  Chiropodist and Address:  [facility], [address], [place], [place] [address]      Provider Number: [id number]  Attending Physician Name and Address:  Delfino Lovett, MD  Relative Name and Phone Number:       Current Level of Care: Hospital Recommended Level of Care: Skilled Nursing Facility Prior Approval Number:    Date Approved/Denied:   PASRR Number: [id number]  Discharge Plan: SNF    Current Diagnoses: Patient Active Problem List   Diagnosis Date Noted  . Sepsis (HCC) 08/26/2018  . Acute on chronic systolic CHF (congestive heart failure) (HCC) 08/17/2018  . Lactic acid acidosis 10/25/2017  . Generalized abdominal pain   . Hematemesis with nausea   . Acute posthemorrhagic anemia   . Gout attack 02/22/2017    Orientation RESPIRATION BLADDER Height & Weight     Self, Time, Situation, Place  Normal Continent Weight: 198 lb 13.7 oz (90.2 kg) Height:     BEHAVIORAL SYMPTOMS/MOOD NEUROLOGICAL BOWEL NUTRITION STATUS  (none) (none) Continent Diet(reg)  AMBULATORY STATUS COMMUNICATION OF NEEDS Skin   Extensive Assist Verbally Normal                       Personal Care Assistance Level of Assistance  Bathing, Dressing Bathing Assistance: Limited assistance   Dressing Assistance: Limited assistance     Functional Limitations Info  (none noted)          SPECIAL CARE FACTORS FREQUENCY  PT (By licensed PT)                    Contractures Contractures Info: Not present    Additional Factors Info  Code Status Code Status Info: full             Current Medications (08/27/2018):  This is the current hospital active medication list Current Facility-Administered Medications  Medication Dose Route  Frequency Provider Last Rate Last Dose  . 0.9 %  sodium chloride infusion (Manually program via Guardrails IV Fluids)   Intravenous Once Raliegh Scarlet, NP      . 0.9 % NaCl with KCl 20 mEq/ L  infusion   Intravenous Continuous Juanell Fairly, MD 75 mL/hr at 08/27/18 0145    . acetaminophen (TYLENOL) tablet 650 mg  650 mg Oral Q6H PRN Juanell Fairly, MD       Or  . acetaminophen (TYLENOL) suppository 650 mg  650 mg Rectal Q6H PRN Juanell Fairly, MD      . albuterol (PROVENTIL) (2.5 MG/3ML) 0.083% nebulizer solution 2.5 mg  2.5 mg Nebulization Q6H PRN Juanell Fairly, MD      . allopurinol (ZYLOPRIM) tablet 100 mg  100 mg Oral Daily Juanell Fairly, MD   100 mg at 08/27/18 0957  . alum & mag hydroxide-simeth (MAALOX/MYLANTA) 200-200-20 MG/5ML suspension 30 mL  30 mL Oral Q4H PRN Juanell Fairly, MD      . bisacodyl (DULCOLAX) suppository 10 mg  10 mg Rectal Daily PRN Juanell Fairly, MD      . colchicine tablet 0.6 mg  0.6 mg Oral Daily Juanell Fairly, MD   0.6 mg at 08/27/18 0956  . docusate sodium (COLACE) capsule 100 mg  100 mg Oral BID Juanell Fairly, MD      .  enoxaparin (LOVENOX) injection 40 mg  40 mg Subcutaneous Q24H Juanell Fairly, MD   40 mg at 08/27/18 0955  . folic acid (FOLVITE) tablet 1 mg  1 mg Oral Daily Juanell Fairly, MD   1 mg at 08/27/18 0957  . furosemide (LASIX) tablet 20 mg  20 mg Oral BID Juanell Fairly, MD   20 mg at 08/27/18 0957  . guaiFENesin-dextromethorphan (ROBITUSSIN DM) 100-10 MG/5ML syrup 5 mL  5 mL Oral Q4H PRN Juanell Fairly, MD      . HYDROcodone-acetaminophen (NORCO) 7.5-325 MG per tablet 1-2 tablet  1-2 tablet Oral Q4H PRN Juanell Fairly, MD      . HYDROcodone-acetaminophen (NORCO/VICODIN) 5-325 MG per tablet 1-2 tablet  1-2 tablet Oral Q4H PRN Juanell Fairly, MD   2 tablet at 08/27/18 1124  . LORazepam (ATIVAN) tablet 1 mg  1 mg Oral Q6H PRN Juanell Fairly, MD       Or  . LORazepam (ATIVAN) injection 1 mg  1 mg Intravenous Q6H  PRN Juanell Fairly, MD      . LORazepam (ATIVAN) tablet 0-4 mg  0-4 mg Oral Q6H Juanell Fairly, MD       Followed by  . [START ON 08/28/2018] LORazepam (ATIVAN) tablet 0-4 mg  0-4 mg Oral Q12H Juanell Fairly, MD      . magnesium citrate solution 1 Bottle  1 Bottle Oral Once PRN Juanell Fairly, MD      . magnesium sulfate IVPB 4 g 100 mL  4 g Intravenous Once Bertram Savin, RPH 50 mL/hr at 08/27/18 1323 4 g at 08/27/18 1323  . menthol-cetylpyridinium (CEPACOL) lozenge 3 mg  1 lozenge Oral PRN Juanell Fairly, MD       Or  . phenol (CHLORASEPTIC) mouth spray 1 spray  1 spray Mouth/Throat PRN Juanell Fairly, MD      . methocarbamol (ROBAXIN) tablet 500 mg  500 mg Oral Q6H PRN Juanell Fairly, MD       Or  . methocarbamol (ROBAXIN) 500 mg in dextrose 5 % 50 mL IVPB  500 mg Intravenous Q6H PRN Juanell Fairly, MD      . metoCLOPramide (REGLAN) tablet 5-10 mg  5-10 mg Oral Q8H PRN Juanell Fairly, MD       Or  . metoCLOPramide (REGLAN) injection 5-10 mg  5-10 mg Intravenous Q8H PRN Juanell Fairly, MD      . metoprolol tartrate (LOPRESSOR) tablet 25 mg  25 mg Oral BID Juanell Fairly, MD   25 mg at 08/27/18 0956  . morphine 2 MG/ML injection 0.5-1 mg  0.5-1 mg Intravenous Q2H PRN Juanell Fairly, MD      . multivitamin with minerals tablet 1 tablet  1 tablet Oral Daily Juanell Fairly, MD   1 tablet at 08/27/18 [phone]  . ondansetron (ZOFRAN) tablet 4 mg  4 mg Oral Q6H PRN Juanell Fairly, MD       Or  . ondansetron [facility]) injection 4 mg  4 mg Intravenous Q6H PRN Juanell Fairly, MD      . pantoprazole (PROTONIX) EC tablet 40 mg  40 mg Oral Daily Juanell Fairly, MD   40 mg at 08/27/18 0957  . polyethylene glycol (MIRALAX / GLYCOLAX) packet 17 g  17 g Oral Daily PRN Juanell Fairly, MD      . predniSONE (DELTASONE) tablet 50 mg  50 mg Oral Q breakfast Juanell Fairly, MD   50 mg at 08/27/18 0956  . sodium chloride flush (NS) 0.9 % injection 3 mL  3  mL Intravenous Once  Juanell Fairly, MD      . tamsulosin [facility]) capsule 0.4 mg  0.4 mg Oral Daily Juanell Fairly, MD   0.4 mg at 08/27/18 0957  . thiamine (VITAMIN B-1) tablet 100 mg  100 mg Oral Daily Juanell Fairly, MD   100 mg at 08/27/18 4098   Or  . thiamine (B-1) injection 100 mg  100 mg Intravenous Daily Juanell Fairly, MD      . vancomycin (VANCOCIN) IVPB 1000 mg/200 mL premix  1,000 mg Intravenous Q12H Juanell Fairly, MD      . vancomycin (VANCOCIN) IVPB 750 mg/150 ml premix  750 mg Intravenous Once Juanell Fairly, MD      . vitamin B-12 (CYANOCOBALAMIN) tablet 1,000 mcg  1,000 mcg Oral Daily Juanell Fairly, MD   1,000 mcg at 08/27/18 1191     Discharge Medications: Please see discharge summary for a list of discharge medications.  Relevant Imaging Results:  Relevant Lab Results:   Additional Information ss [id number]  York Spaniel, LCSW

## 2018-08-27 NOTE — Clinical Social Work Note (Signed)
CSW consulted for nursing home placement. Patient came from home and is having difficulty ambulating with his knee. PT is not able to work with patient yet due to pain control. FL2 and pasrr have been completed I(but not faxed out) n the event patient will require short term rehab.

## 2018-08-27 NOTE — Progress Notes (Signed)
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 POD #1 from I x D with extensive synovectomy by dr Martha Clan. Dr Rivka Safer to see formally tomorrow  In the meantime, please narrow abtx to vancomycin. Will need work up for endocarditis given he has staph aureus bacteremia Please hold off on any picc line placement until bacteremia has been cleared via repeat blood cx NGTD x 48rhs     [facility] Staphylococcus aureus bacteremia   Staphylococcus aureus bacteremia (SAB) is associated with a high rate of complications and mortality.  Specific aspects of clinical management are critical to optimizing the outcome of patients with SAB.  Therefore, the [facility] Antimicrobial Management Team [facility]) has initiated an intervention aimed at improving the management of SAB at [facility].  To do so, Infectious Diseases physicians are providing an evidence-based consult for the management of all patients with SAB.     Yes No Comments  Perform follow-up blood cultures (even if the patient is afebrile) to ensure clearance of bacteremia [x]  []  Will repeat blood cx today  Remove vascular catheter and obtain follow-up blood cultures after the removal of the catheter []  []    Perform echocardiography to evaluate for endocarditis (transthoracic ECHO is 40-50% sensitive, TEE is > 90% sensitive) []  []  Please keep in mind, that neither test can definitively EXCLUDE endocarditis, and that should clinical suspicion remain high for endocarditis the patient should then still be treated with an "endocarditis" duration of therapy = 6 weeks  Consult electrophysiologist to evaluate implanted cardiac device (pacemaker, ICD) []  []    Ensure source control [x]  []  Have all abscesses been drained effectively? Have deep seeded infections (septic joints or osteomyelitis) had appropriate surgical debridement?   Investigate for "metastatic" sites of infection [x]  []  Does the patient have ANY symptom or physical exam finding that would suggest a deeper infection (back or neck pain that may be suggestive of vertebral osteomyelitis or epidural abscess, muscle pain that could be a symptom of pyomyositis)?  Keep in mind that for deep seeded infections MRI imaging with contrast is preferred rather than other often insensitive tests such as plain x-rays, especially early in a patient's presentation.  Change antibiotic therapy to ____vancomycin___ []  []  Beta-lactam antibiotics are preferred for MSSA due to higher cure rates.   If on Vancomycin, goal trough should be 15 - 20 mcg/mL  Estimated duration of IV antibiotic therapy:  Minimum 4 wk []  []  Consult case management for probably prolonged outpatient IV antibiotic therapy

## 2018-08-27 NOTE — Progress Notes (Signed)
PT Cancellation Note  Patient Details Name: Larry Andrade. MRN: [id number] DOB: 1960/09/09   Cancelled Treatment:    Reason Eval/Treat Not Completed: Other (comment)(Per RN wait until after pt has pain medication). RN reported that pt was in a lot of pain and it would be best to attempt therapy following pain medication administration. PT to attempt to see at a later time, will follow acutely.    Emeline Gins, SPT  08/27/2018, 11:24 AM

## 2018-08-27 NOTE — Progress Notes (Signed)
Pharmacy Electrolyte Monitoring Consult:  Pharmacy consulted to assist in monitoring and replacing electrolytes in this 58 y.o. male admitted on 08/26/2018 with Knee Pain  Patient currently ordered CIWA. Patient's MIVF is NS/Potassium 50mEq/L @ 65mL/hr.   Labs:  Sodium (mmol/L)  Date Value  08/27/2018 136   Potassium (mmol/L)  Date Value  08/27/2018 3.8   Magnesium (mg/dL)  Date Value  57/50/5183 1.5 (L)   Phosphorus (mg/dL)  Date Value  35/82/5189 5.1 (H)   Calcium (mg/dL)  Date Value  84/21/0312 7.1 (L)   Calcium, Total (PTH) (mg/dL)  Date Value  81/18/8677 9.4   Albumin (g/dL)  Date Value  37/36/6815 1.7 (L)   Corrected Calcium: 8.5   Assessment/Plan: Will order magnesium 4g IV x 1. Will recheck electrolytes with am labs.   Will replace for goal magnesium ~ 2.   Pharmacy will continue to monitor and adjust per consult.   Simpson,Michael L 08/27/2018 10:57 AM

## 2018-08-27 NOTE — Progress Notes (Signed)
Subjective:  POD #1 s/p right knee arthroscopic I&D.  Patient reports right pain as moderate.  Patient has monosodium urate crystals in synovial fluid samples as well as gram + cocci.   Blood cultures growing MRSA.  Synovial cultures pending.  Objective:   VITALS:   Vitals:   08/27/18 0200 08/27/18 0300 08/27/18 0517 08/27/18 0823  BP: 95/64 98/67 104/81 109/72  Pulse: (!) 102 98 (!) 107 (!) 115  Resp: 18 18 18 18   Temp: 97.6 F (36.4 C) 98 F (36.7 C) 98 F (36.7 C) 98 F (36.7 C)  TempSrc: Oral Oral Oral Oral  SpO2: 100% 100% 97% 98%  Weight:        PHYSICAL EXAM: Right knee:  Dressing C/D/I.  Sensation in bilateral feet diminished due to baseline neuropathy.  + edema in right lower extremity, unchanged from yesterday.  Motor function intact.   LABS  Results for orders placed or performed during the hospital encounter of 08/26/18 (from the past 24 hour(s))  CBC with Differential     Status: Abnormal   Collection Time: 08/26/18  2:51 PM  Result Value Ref Range   WBC 35.0 (H) 4.0 - 10.5 K/uL   RBC 3.18 (L) 4.22 - 5.81 MIL/uL   Hemoglobin 10.7 (L) 13.0 - 17.0 g/dL   HCT 35.8 (L) 25.1 - 89.8 %   MCV 104.7 (H) 80.0 - 100.0 fL   MCH 33.6 26.0 - 34.0 pg   MCHC 32.1 30.0 - 36.0 g/dL   RDW 42.1 (H) 03.1 - 28.1 %   Platelets 305 150 - 400 K/uL   nRBC 0.0 0.0 - 0.2 %   Neutrophils Relative % 90 %   Neutro Abs 31.1 (H) 1.7 - 7.7 K/uL   Lymphocytes Relative 5 %   Lymphs Abs 1.9 0.7 - 4.0 K/uL   Monocytes Relative 3 %   Monocytes Absolute 1.2 (H) 0.1 - 1.0 K/uL   Eosinophils Relative 0 %   Eosinophils Absolute 0.1 0.0 - 0.5 K/uL   Basophils Relative 0 %   Basophils Absolute 0.1 0.0 - 0.1 K/uL   WBC Morphology MORPHOLOGY UNREMARKABLE    Smear Review PLATELETS APPEAR ADEQUATE    Immature Granulocytes 2 %   Abs Immature Granulocytes 0.66 (H) 0.00 - 0.07 K/uL   Polychromasia PRESENT    Target Cells PRESENT   Comprehensive metabolic panel     Status: Abnormal   Collection  Time: 08/26/18  2:51 PM  Result Value Ref Range   Sodium 135 135 - 145 mmol/L   Potassium 3.8 3.5 - 5.1 mmol/L   Chloride 98 98 - 111 mmol/L   CO2 21 (L) 22 - 32 mmol/L   Glucose, Bld 81 70 - 99 mg/dL   BUN 26 (H) 6 - 20 mg/dL   Creatinine, Ser 1.88 (H) 0.61 - 1.24 mg/dL   Calcium 7.9 (L) 8.9 - 10.3 mg/dL   Total Protein 6.3 (L) 6.5 - 8.1 g/dL   Albumin 2.1 (L) 3.5 - 5.0 g/dL   AST 28 15 - 41 U/L   ALT 17 0 - 44 U/L   Alkaline Phosphatase 225 (H) 38 - 126 U/L   Total Bilirubin 4.9 (H) 0.3 - 1.2 mg/dL   GFR calc non Af Amer 59 (L) >60 mL/min   GFR calc Af Amer >60 >60 mL/min   Anion gap 16 (H) 5 - 15  Lactic acid, plasma     Status: Abnormal   Collection Time: 08/26/18  3:25 PM  Result  Value Ref Range   Lactic Acid, Venous 3.8 (HH) 0.5 - 1.9 mmol/L  Blood culture (routine x 2)     Status: None (Preliminary result)   Collection Time: 08/26/18  3:25 PM  Result Value Ref Range   Specimen Description BLOOD LEFT ANTECUBITAL    Special Requests      BOTTLES DRAWN AEROBIC AND ANAEROBIC Blood Culture adequate volume   Culture  Setup Time      GRAM POSITIVE COCCI IN BOTH AEROBIC AND ANAEROBIC BOTTLES CRITICAL RESULT CALLED TO, READ BACK BY AND VERIFIED WITH: DAVID BESANTI ON 08/27/2018 AT 0620 QSD Performed at [facility], [address], [place], [place] [address]    Culture GRAM POSITIVE COCCI    Report Status PENDING   Blood culture (routine x 2)     Status: None (Preliminary result)   Collection Time: 08/26/18  3:30 PM  Result Value Ref Range   Specimen Description      BLOOD BLOOD RIGHT FOREARM Performed at [facility], [address], [place], [place] [address]    Special Requests      BOTTLES DRAWN AEROBIC AND ANAEROBIC Blood Culture results may not be optimal due to an inadequate volume of blood received in culture bottles Performed at [facility], [address] [place], [place] [address]    Culture  Setup Time      GRAM POSITIVE  COCCI IN BOTH AEROBIC AND ANAEROBIC BOTTLES CRITICAL RESULT CALLED TO, READ BACK BY AND VERIFIED WITH: DAVID BESANTI ON 08/27/2018 AT 0620 QSD Performed at [facility], [address] [place], [place] [address]    Culture GRAM POSITIVE COCCI    Report Status PENDING   Protime-INR     Status: Abnormal   Collection Time: 08/26/18  3:30 PM  Result Value Ref Range   Prothrombin Time 16.4 (H) 11.4 - 15.2 seconds   INR 1.3 (H) 0.8 - 1.2  Urinalysis, Complete w Microscopic     Status: Abnormal   Collection Time: 08/26/18  3:30 PM  Result Value Ref Range   Color, Urine AMBER (A) YELLOW   APPearance CLEAR (A) CLEAR   Specific Gravity, Urine 1.025 1.005 - 1.030   pH 5.0 5.0 - 8.0   Glucose, UA NEGATIVE NEGATIVE mg/dL   Hgb urine dipstick SMALL (A) NEGATIVE   Bilirubin Urine SMALL (A) NEGATIVE   Ketones, ur NEGATIVE NEGATIVE mg/dL   Protein, ur 30 (A) NEGATIVE mg/dL   Nitrite NEGATIVE NEGATIVE   Leukocytes,Ua NEGATIVE NEGATIVE   RBC / HPF 0-5 0 - 5 RBC/hpf   WBC, UA 0-5 0 - 5 WBC/hpf   Bacteria, UA NONE SEEN NONE SEEN   Squamous Epithelial / LPF 0-5 0 - 5   Mucus PRESENT    Hyaline Casts, UA PRESENT   Blood Culture ID Panel (Reflexed)     Status: Abnormal   Collection Time: 08/26/18  3:30 PM  Result Value Ref Range   Enterococcus species NOT DETECTED NOT DETECTED   Listeria monocytogenes NOT DETECTED NOT DETECTED   Staphylococcus species DETECTED (A) NOT DETECTED   Staphylococcus aureus (BCID) DETECTED (A) NOT DETECTED   Methicillin resistance DETECTED (A) NOT DETECTED   Streptococcus species NOT DETECTED NOT DETECTED   Streptococcus agalactiae NOT DETECTED NOT DETECTED   Streptococcus pneumoniae NOT DETECTED NOT DETECTED   Streptococcus pyogenes NOT DETECTED NOT DETECTED   Acinetobacter baumannii NOT DETECTED NOT DETECTED   Enterobacteriaceae species NOT DETECTED NOT DETECTED   Enterobacter cloacae complex  NOT DETECTED NOT DETECTED   Escherichia coli NOT DETECTED NOT  DETECTED   Klebsiella oxytoca NOT DETECTED NOT DETECTED   Klebsiella pneumoniae NOT DETECTED NOT DETECTED   Proteus species NOT DETECTED NOT DETECTED   Serratia marcescens NOT DETECTED NOT DETECTED   Haemophilus influenzae NOT DETECTED NOT DETECTED   Neisseria meningitidis NOT DETECTED NOT DETECTED   Pseudomonas aeruginosa NOT DETECTED NOT DETECTED   Candida albicans NOT DETECTED NOT DETECTED   Candida glabrata NOT DETECTED NOT DETECTED   Candida krusei NOT DETECTED NOT DETECTED   Candida parapsilosis NOT DETECTED NOT DETECTED   Candida tropicalis NOT DETECTED NOT DETECTED  Magnesium     Status: Abnormal   Collection Time: 08/26/18  3:34 PM  Result Value Ref Range   Magnesium 1.6 (L) 1.7 - 2.4 mg/dL  Brain natriuretic peptide     Status: Abnormal   Collection Time: 08/26/18  3:46 PM  Result Value Ref Range   B Natriuretic Peptide 361.0 (H) 0.0 - 100.0 pg/mL  Influenza panel by PCR (type A & B)     Status: None   Collection Time: 08/26/18  4:07 PM  Result Value Ref Range   Influenza A By PCR NEGATIVE NEGATIVE   Influenza B By PCR NEGATIVE NEGATIVE  Body fluid culture     Status: None (Preliminary result)   Collection Time: 08/26/18  4:19 PM  Result Value Ref Range   Specimen Description      KNEE Performed at [facility], [address], [place], [place] [address]    Special Requests      NONE Performed at [facility], [address] [place], [place] [address]    Gram Stain      FEW WBC PRESENT, PREDOMINANTLY PMN ABUNDANT GRAM POSITIVE COCCI IN PAIRS IN CLUSTERS Performed at [facility], [address], [place], [place] [address]    Culture PENDING    Report Status PENDING   Synovial cell count + diff, w/ crystals     Status: Abnormal   Collection Time: 08/26/18  4:19 PM  Result Value Ref Range   Color, Synovial YELLOW YELLOW   Appearance-Synovial TURBID (A) CLEAR   Crystals, Fluid EXTRACELLULAR MONOSODIUM URATE CRYSTALS    WBC, Synovial  199,176 (H) 0 - 200 /cu mm   Neutrophil, Synovial 88 %   Lymphocytes-Synovial Fld 10 %   Monocyte-Macrophage-Synovial Fluid 2 %   Eosinophils-Synovial 0 %  Troponin I - Add-On to previous collection     Status: Abnormal   Collection Time: 08/26/18  4:57 PM  Result Value Ref Range   Troponin I 0.03 (HH) <0.03 ng/mL  Lactic acid, plasma     Status: Abnormal   Collection Time: 08/26/18  6:08 PM  Result Value Ref Range   Lactic Acid, Venous 2.6 (HH) 0.5 - 1.9 mmol/L  Sedimentation rate     Status: Abnormal   Collection Time: 08/26/18  7:15 PM  Result Value Ref Range   Sed Rate 127 (H) 0 - 20 mm/hr  C-reactive protein     Status: Abnormal   Collection Time: 08/26/18  7:15 PM  Result Value Ref Range   CRP 28.5 (H) <1.0 mg/dL  MRSA PCR Screening     Status: None   Collection Time: 08/26/18  8:31 PM  Result Value Ref Range   MRSA by PCR NEGATIVE NEGATIVE  Synovial cell count + diff, w/ crystals     Status: Abnormal   Collection Time: 08/26/18  9:49 PM  Result Value Ref Range   Color, Synovial WHITE (A) YELLOW   Appearance-Synovial TURBID (A) CLEAR   Crystals, Fluid EXTRACELLULAR MONOSODIUM URATE CRYSTALS    WBC, Synovial 102,683 (H) 0 - 200 /cu mm   Neutrophil, Synovial 90 %   Lymphocytes-Synovial Fld 7 %   Monocyte-Macrophage-Synovial Fluid 3 %   Eosinophils-Synovial 0 %  Comprehensive metabolic panel     Status: Abnormal   Collection Time: 08/27/18  5:04 AM  Result Value Ref Range   Sodium 136 135 - 145 mmol/L   Potassium 3.8 3.5 - 5.1 mmol/L   Chloride 102 98 - 111 mmol/L   CO2 23 22 - 32 mmol/L   Glucose, Bld 117 (H) 70 - 99 mg/dL   BUN 27 (H) 6 - 20 mg/dL   Creatinine, Ser 4.09 (H) 0.61 - 1.24 mg/dL   Calcium 7.1 (L) 8.9 - 10.3 mg/dL   Total Protein 4.8 (L) 6.5 - 8.1 g/dL   Albumin 1.7 (L) 3.5 - 5.0 g/dL   AST 23 15 - 41 U/L   ALT 13 0 - 44 U/L   Alkaline Phosphatase 160 (H) 38 - 126 U/L   Total Bilirubin 5.0 (H) 0.3 - 1.2 mg/dL   GFR calc non Af Amer >60 >60 mL/min    GFR calc Af Amer >60 >60 mL/min   Anion gap 11 5 - 15  CBC     Status: Abnormal   Collection Time: 08/27/18  5:04 AM  Result Value Ref Range   WBC 27.4 (H) 4.0 - 10.5 K/uL   RBC 2.19 (L) 4.22 - 5.81 MIL/uL   Hemoglobin 7.4 (L) 13.0 - 17.0 g/dL   HCT 81.1 (L) 91.4 - 78.2 %   MCV 104.6 (H) 80.0 - 100.0 fL   MCH 33.8 26.0 - 34.0 pg   MCHC 32.3 30.0 - 36.0 g/dL   RDW 95.6 (H) 21.3 - 08.6 %   Platelets 207 150 - 400 K/uL   nRBC 0.0 0.0 - 0.2 %  Phosphorus     Status: Abnormal   Collection Time: 08/27/18  5:04 AM  Result Value Ref Range   Phosphorus 5.1 (H) 2.5 - 4.6 mg/dL  Magnesium     Status: Abnormal   Collection Time: 08/27/18  5:04 AM  Result Value Ref Range   Magnesium 1.5 (L) 1.7 - 2.4 mg/dL  Prepare RBC     Status: None   Collection Time: 08/27/18  6:34 AM  Result Value Ref Range   Order Confirmation      ORDER PROCESSED BY BLOOD BANK Performed at [facility], [address] [place], [place] [address]   Type and screen [facility]     Status: None (Preliminary result)   Collection Time: 08/27/18  7:59 AM  Result Value Ref Range   ABO/RH(D) AB POS    Antibody Screen NEG    Sample Expiration 08/30/2018    Unit Number [id number]    Blood Component Type RED CELLS,LR    Unit division 00    Status of Unit ALLOCATED    Transfusion Status OK TO TRANSFUSE    Crossmatch Result      Compatible Performed at [facility], [address] [place], [place] [address]   Protime-INR     Status: Abnormal   Collection Time: 08/27/18  7:59 AM  Result Value Ref Range   Prothrombin Time 17.1 (H) 11.4 - 15.2 seconds   INR 1.4 (H) 0.8 - 1.2  PTT  Status: Abnormal   Collection Time: 08/27/18  7:59 AM  Result Value Ref Range   aPTT 41 (H) 24 - 36 seconds  Fibrinogen     Status: Abnormal   Collection Time: 08/27/18  7:59 AM  Result Value Ref Range   Fibrinogen 700 (H) 210 - 475 mg/dL  Lactic acid, plasma     Status: Abnormal    Collection Time: 08/27/18  7:59 AM  Result Value Ref Range   Lactic Acid, Venous 2.3 (HH) 0.5 - 1.9 mmol/L    Dg Knee 1-2 Views Right  Result Date: 08/26/2018 CLINICAL DATA:  Knee pain.  No known injury.  Initial encounter. EXAM: RIGHT KNEE - 1-2 VIEW COMPARISON:  None. FINDINGS: A moderate to large knee effusion is noted. Tricompartmental degenerative changes are present. No acute fracture or dislocation. IMPRESSION: Moderate to large knee effusion without acute bony abnormality. Tricompartment degenerative changes. Electronically Signed   By: Harmon Pier M.D.   On: 08/26/2018 18:50   Ct Tibia Fibula Right W Contrast  Result Date: 08/26/2018 CLINICAL DATA:  Right lower extremity pain and swelling. EXAM: CT OF THE LOWER RIGHT EXTREMITY WITH CONTRAST TECHNIQUE: Multidetector CT imaging of the lower right extremity was performed according to the standard protocol following intravenous contrast administration. COMPARISON:  Radiographs 08/26/2018 CONTRAST:  OMNIPAQUE IOHEXOL 300 MG/ML  SOLN FINDINGS: There is a very large and complex right knee joint effusion with enhancing synovium and synovial calcifications. There is also some air in the joint but this may be related to recent prior joint aspiration. There is also an erosive process involving the tibiofibular joint with a large erosion involving the fibular head and nearby adjacent tibia. 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 Similar findings at the ankle joint with a large complex joint effusion, synovial enhancement and nodular calcified tissue. There also scattered erosions involving the tibia, fibula and talus. Extensive erosive changes are also noted involving the cuneiforms. The anterior ankle tendons demonstrate thickening, nodularity and calcification which is also likely gout related. There is diffuse and fairly marked subcutaneous soft tissue swelling/edema/fluid involving the entire right lower  extremity which would suggest cellulitis. No discrete rim enhancing drainable fluid collection to suggest an abscess. No findings for myofasciitis or pyomyositis. IMPRESSION: 1. Large complex joint effusions involving the knee joint and ankle joint as detailed above. I think the findings are most likely due to gout. Extensive tophaceous changes. Recommend correlation with recent joint aspirations. Could not totally exclude the possibility of superimposed septic arthritis. 2. Erosive changes and soft tissue tophi surrounding the tibiofibular joint proximally. 3. Advanced erosive changes involving the proximal and midfoot bony structures. 4. Tophaceous changes involving the anterior ankle tendons. 5. Diffuse subcutaneous soft tissue swelling/edema/fluid suggesting cellulitis. No definite findings for myofasciitis or pyomyositis. Electronically Signed   By: Rudie Meyer M.D.   On: 08/26/2018 19:23   US Venous Img Lower Unilateral Right  Result Date: 08/27/2018 CLINICAL DATA:  Right lower extremity swelling and pain. EXAM: RIGHT LOWER EXTREMITY VENOUS DOPPLER ULTRASOUND TECHNIQUE: Gray-scale sonography with graded compression, as well as color Doppler and duplex ultrasound were performed to evaluate the lower extremity deep venous systems from the level of the common femoral vein and including the common femoral, femoral, profunda femoral, popliteal and calf veins including the posterior tibial, peroneal and gastrocnemius veins when visible. The superficial great saphenous vein was also interrogated. Spectral Doppler was utilized to evaluate flow at rest and with distal augmentation maneuvers  in the common femoral, femoral and popliteal veins. COMPARISON:  None. FINDINGS: Contralateral Common Femoral Vein: Respiratory phasicity is normal and symmetric with the symptomatic side. No evidence of thrombus. Normal compressibility. Common Femoral Vein: No evidence of thrombus. Normal compressibility, respiratory phasicity  and response to augmentation. Saphenofemoral Junction: No evidence of thrombus. Normal compressibility and flow on color Doppler imaging. Profunda Femoral Vein: No evidence of thrombus. Normal compressibility and flow on color Doppler imaging. Femoral Vein: No evidence of thrombus. Normal compressibility, respiratory phasicity and response to augmentation. Popliteal Vein: No evidence of thrombus. Normal compressibility, respiratory phasicity and response to augmentation. Calf Veins: No evidence of thrombus. Normal compressibility and flow on color Doppler imaging. Superficial Great Saphenous Vein: No evidence of thrombus. Normal compressibility. Venous Reflux:  None. Other Findings:  None. IMPRESSION: No evidence of deep venous thrombosis. Electronically Signed   By: Myles Rosenthal M.D.   On: 08/27/2018 09:36   Dg Chest Port 1 View  Result Date: 08/26/2018 CLINICAL DATA:  Sepsis EXAM: PORTABLE CHEST 1 VIEW COMPARISON:  08/16/2018 chest radiograph FINDINGS: The cardiomediastinal silhouette is unremarkable. There is no evidence of focal airspace disease, pulmonary edema, suspicious pulmonary nodule/mass, pleural effusion, or pneumothorax. No acute bony abnormalities are identified. Remote RIGHT rib fractures. IMPRESSION: No active disease. Electronically Signed   By: Harmon Pier M.D.   On: 08/26/2018 16:41    Assessment/Plan: 1 Day Post-Op   Active Problems:   Sepsis (HCC)  Patient is growing MRSA in his blood culture.  Synovial cultures are pending.  Right knee washed out last night after + aspirate in the ER.  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  Continue current pain management.  Patient may WBAT.   May participate in PT as pain allows.  Continue on antibiotics per medicine.  Patient is on vancomycin.    Juanell Fairly , MD 08/27/2018, 12:50 PM

## 2018-08-27 NOTE — Progress Notes (Signed)
[employer] - [place] [facility]   PATIENT NAME: Larry Andrade    MR#:  [id number]  DATE OF BIRTH:  1960/08/11  SUBJECTIVE:  CHIEF COMPLAINT:   Chief Complaint  Patient presents with  . Knee Pain   8/10 pain in the right knee radiates in a burning sensation to R foot and toes. States pain medications are helping only a little. States he takes high dose narcotics for pain historically.  Edema in R leg lower thigh to foot. No fever, chills, nausea, vomiting.  Appetite improved and awaiting meal tray.  Gout flare in L dorsal wrist surface with associated pain.  Coughing x 3-4 weeks bringing up yellow-ish phlegm. States he was recently treated for cough for 4-5 days with PCP. Unable to provide more details. Documentation not found in chart.   REVIEW OF SYSTEMS:  Review of Systems  Constitutional: Positive for malaise/fatigue. Negative for chills, fever.   HENT: Negative.  Negative for ear discharge, ear pain, hearing loss, nosebleeds and sore throat.   Eyes: Negative.  Negative for blurred vision and pain.  Respiratory: Positive for cough and sputum production (light yellow). Negative for hemoptysis, shortness of breath and wheezing.   Cardiovascular: Positive for leg swelling. Negative for chest pain, palpitations, orthopnea, PND.  Gastrointestinal: Negative.  Negative for abdominal pain, blood in stool, diarrhea, nausea and vomiting.  Genitourinary: Negative.  Negative for dysuria.  Musculoskeletal: Positive for joint pain. Negative for back pain.  Skin: Negative.   Neurological: Negative for dizziness, tremors, speech change, focal weakness, seizures and headaches.  Endo/Heme/Allergies: Negative.  Does not bruise/bleed easily.  Psychiatric/Behavioral: Negative.  Negative for depression, hallucinations and suicidal ideas.   DRUG ALLERGIES:   Allergies  Allergen Reactions  . Penicillins Rash    Has patient had a PCN reaction causing immediate rash,  facial/tongue/throat swelling, SOB or lightheadedness with hypotension: No Has patient had a PCN reaction causing severe rash involving mucus membranes or skin necrosis: No Has patient had a PCN reaction that required hospitalization: No Has patient had a PCN reaction occurring within the last 10 years: No If all of the above answers are "NO", then may proceed with Cephalosporin use.    VITALS:  Blood pressure 98/70, pulse 88, temperature 98.9 F (37.2 C), temperature source Oral, resp. rate 18, weight 90.2 kg, SpO2 97 %. PHYSICAL EXAMINATION:  Physical Exam Constitutional:      General: He is not in acute distress. HENT:     Head: Normocephalic.  Eyes:     General: No scleral icterus.    Pupils: Pupils are equal, round, and reactive to light.  Neck:     Musculoskeletal: Normal range of motion and neck supple.     Vascular: No JVD.     Trachea: No tracheal deviation.  Cardiovascular:     Rate and Rhythm: Normal rate and regular rhythm.     Heart sounds: Normal heart sounds. No murmur. No friction rub. No gallop.   Pulmonary:     Effort: Pulmonary effort is normal. No respiratory distress.     Breath sounds: Normal breath sounds, upper airway sounds clear with coughing.    No wheezing or rales.  Chest:     Chest wall: No tenderness.  Abdominal:     General: Bowel sounds are normal. There is distension.     Palpations: Abdomen is soft. There is no mass.     Tenderness: There is no abdominal tenderness. There is no guarding or rebound.  Musculoskeletal:  General: Swelling and deformity present.     Right lower leg: Edema present.     Comments: Edematous and tender to palpation from lower thigh to foot R lower extremity. Swelling, warmth, erythema at the L wrist dorsal surface (existing gout flare per patient) Skin:    General: Skin is warm.     Findings: No erythema or rash.  Neurological:     General: No focal deficit present.     Mental Status: He is alert and  oriented to person, place, and time. Mental status is at baseline.  Psychiatric:        Judgment: Judgment normal.  LABORATORY PANEL:  Male CBC Recent Labs  Lab 08/27/18 0504  WBC 27.4*  HGB 7.4*  HCT 22.9*  PLT 207   ------------------------------------------------------------------------------------------------------------------ Chemistries  Recent Labs  Lab 08/27/18 0504  NA 136  K 3.8  CL 102  CO2 23  GLUCOSE 117*  BUN 27*  CREATININE 1.32*  CALCIUM 7.1*  MG 1.5*  AST 23  ALT 13  ALKPHOS 160*  BILITOT 5.0*   RADIOLOGY:  Dg Knee 1-2 Views Right  Result Date: 08/26/2018 CLINICAL DATA:  Knee pain.  No known injury.  Initial encounter. EXAM: RIGHT KNEE - 1-2 VIEW COMPARISON:  None. FINDINGS: A moderate to large knee effusion is noted. Tricompartmental degenerative changes are present. No acute fracture or dislocation. IMPRESSION: Moderate to large knee effusion without acute bony abnormality. Tricompartment degenerative changes. Electronically Signed   By: Harmon Pier M.D.   On: 08/26/2018 18:50   Ct Tibia Fibula Right W Contrast  Result Date: 08/26/2018 CLINICAL DATA:  Right lower extremity pain and swelling. EXAM: CT OF THE LOWER RIGHT EXTREMITY WITH CONTRAST TECHNIQUE: Multidetector CT imaging of the lower right extremity was performed according to the standard protocol following intravenous contrast administration. COMPARISON:  Radiographs 08/26/2018 CONTRAST:  OMNIPAQUE IOHEXOL 300 MG/ML  SOLN FINDINGS: There is a very large and complex right knee joint effusion with enhancing synovium and synovial calcifications. There is also some air in the joint but this may be related to recent prior joint aspiration. There is also an erosive process involving the tibiofibular joint with a large erosion involving the fibular head and nearby adjacent tibia. 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 Similar findings at the ankle joint with  a large complex joint effusion, synovial enhancement and nodular calcified tissue. There also scattered erosions involving the tibia, fibula and talus. Extensive erosive changes are also noted involving the cuneiforms. The anterior ankle tendons demonstrate thickening, nodularity and calcification which is also likely gout related. There is diffuse and fairly marked subcutaneous soft tissue swelling/edema/fluid involving the entire right lower extremity which would suggest cellulitis. No discrete rim enhancing drainable fluid collection to suggest an abscess. No findings for myofasciitis or pyomyositis. IMPRESSION: 1. Large complex joint effusions involving the knee joint and ankle joint as detailed above. I think the findings are most likely due to gout. Extensive tophaceous changes. Recommend correlation with recent joint aspirations. Could not totally exclude the possibility of superimposed septic arthritis. 2. Erosive changes and soft tissue tophi surrounding the tibiofibular joint proximally. 3. Advanced erosive changes involving the proximal and midfoot bony structures. 4. Tophaceous changes involving the anterior ankle tendons. 5. Diffuse subcutaneous soft tissue swelling/edema/fluid suggesting cellulitis. No definite findings for myofasciitis or pyomyositis. Electronically Signed   By: Rudie Meyer M.D.   On: 08/26/2018 19:23   US Venous Img Lower Unilateral Right  Result  Date: 08/27/2018 CLINICAL DATA:  Right lower extremity swelling and pain. EXAM: RIGHT LOWER EXTREMITY VENOUS DOPPLER ULTRASOUND TECHNIQUE: Gray-scale sonography with graded compression, as well as color Doppler and duplex ultrasound were performed to evaluate the lower extremity deep venous systems from the level of the common femoral vein and including the common femoral, femoral, profunda femoral, popliteal and calf veins including the posterior tibial, peroneal and gastrocnemius veins when visible. The superficial great saphenous  vein was also interrogated. Spectral Doppler was utilized to evaluate flow at rest and with distal augmentation maneuvers in the common femoral, femoral and popliteal veins. COMPARISON:  None. FINDINGS: Contralateral Common Femoral Vein: Respiratory phasicity is normal and symmetric with the symptomatic side. No evidence of thrombus. Normal compressibility. Common Femoral Vein: No evidence of thrombus. Normal compressibility, respiratory phasicity and response to augmentation. Saphenofemoral Junction: No evidence of thrombus. Normal compressibility and flow on color Doppler imaging. Profunda Femoral Vein: No evidence of thrombus. Normal compressibility and flow on color Doppler imaging. Femoral Vein: No evidence of thrombus. Normal compressibility, respiratory phasicity and response to augmentation. Popliteal Vein: No evidence of thrombus. Normal compressibility, respiratory phasicity and response to augmentation. Calf Veins: No evidence of thrombus. Normal compressibility and flow on color Doppler imaging. Superficial Great Saphenous Vein: No evidence of thrombus. Normal compressibility. Venous Reflux:  None. Other Findings:  None. IMPRESSION: No evidence of deep venous thrombosis. Electronically Signed   By: Myles Rosenthal M.D.   On: 08/27/2018 09:36   Dg Chest Port 1 View  Result Date: 08/26/2018 CLINICAL DATA:  Sepsis EXAM: PORTABLE CHEST 1 VIEW COMPARISON:  08/16/2018 chest radiograph FINDINGS: The cardiomediastinal silhouette is unremarkable. There is no evidence of focal airspace disease, pulmonary edema, suspicious pulmonary nodule/mass, pleural effusion, or pneumothorax. No acute bony abnormalities are identified. Remote RIGHT rib fractures. IMPRESSION: No active disease. Electronically Signed   By: Harmon Pier M.D.   On: 08/26/2018 16:41   ASSESSMENT AND PLAN:   58 year old male with EtOH liver cirrhosis, chronic diastolic heart failure with preserved ejection fraction, ongoing tobacco abuse who was  discharged from the hospital on 24 February with exacerbation of CHF and gout flare who presents today to the emergency room with right knee swelling and pain.  Plan:  1.  Sepsis from right knee septic joint: Patient presented with tachycardia, tachypnea and leukocytosis Gram stain showing gram-positive cocci, blood culture growing MRSA, synovial fluid culture pending Continue vancomycin POD 1 s/p R knee arthroscopic I&D with Dr. Martha Clan ID consulted and orthopedic surgery following Ordered CT of lower extremity and lower extremity Doppler to rule out DVT and as well as abscesses further down from the right knee due to edema, CT does not show signs of DVT and Doppler also shows no evidence of DVT. Lactic acid level down-trending, ordered recheck and it is pending, last 2.3 this AM  2.  Chronic diastolic heart failure with preserved ejection fraction: No signs of exacerbation at this time. Continue to monitor. Continue Lasix  3.  EtOH abuse with liver cirrhosis: Patient reports his last drink was Wednesday CIWA protocol initiated No ascites on recent abdominal ultrasound  4. Hypomagnesemia, hypocalcemia: Repleted, recheck in a.m. Pharmacy consulted for electrolyte management.  5.  Recent gout flare: Continue allopurinol and colchicine. Monitor for improvement.   6.  Essential hypertension: Continue metoprolol  7. Anemia Hbg 7.4 today. Macrocytic. Chronic EtOH abuse. Overnight NP ordered 1 u pRBCs. Recheck AM labs. Added on folate and B12 check, pending. CIWA protocol as above.  8. Tobacco dependence: Patient is encouraged to quit smoking. Counseling was provided for 4 minutes.  9.  BPH: Continue tamsulosin  10. Cough PRNs ordered. Monitor clinically. Reassess for acute cardiopulmonary processes/CHF exacerbation as needed.   All the records are reviewed and case is discussed with Care Management/Social Worker. Management plans discussed with the patient and/or family and they  are in agreement.  CODE STATUS: Full Code  TOTAL TIME TAKING CARE OF THIS PATIENT: 30 minutes.   More than 50% of the time was spent in counseling/coordination of care: YES  POSSIBLE D/C IN 2-3 DAYS, DEPENDING ON CLINICAL CONDITION.   Urvi Imes PA-C on 08/27/2018 at 2:04 PM  Between 7am to 6pm - Pager - [phone]  After 6 pm go to [email or web address] - Scientist, research (life sciences) [facility]  Office  [phone]  CC: Primary care physician; Gildardo Pounds, PA  Note: This dictation was prepared with Dragon dictation along with smaller phrase technology. Any transcriptional errors that result from this process are unintentional.

## 2018-08-28 ENCOUNTER — Encounter: Payer: Self-pay | Admitting: Orthopedic Surgery

## 2018-08-28 DIAGNOSIS — D649 Anemia, unspecified: Secondary | ICD-10-CM

## 2018-08-28 DIAGNOSIS — B9562 Methicillin resistant Staphylococcus aureus infection as the cause of diseases classified elsewhere: Secondary | ICD-10-CM

## 2018-08-28 DIAGNOSIS — I509 Heart failure, unspecified: Secondary | ICD-10-CM

## 2018-08-28 DIAGNOSIS — K7460 Unspecified cirrhosis of liver: Secondary | ICD-10-CM

## 2018-08-28 DIAGNOSIS — M1A9XX1 Chronic gout, unspecified, with tophus (tophi): Secondary | ICD-10-CM

## 2018-08-28 DIAGNOSIS — R7881 Bacteremia: Secondary | ICD-10-CM

## 2018-08-28 DIAGNOSIS — Z880 Allergy status to penicillin: Secondary | ICD-10-CM

## 2018-08-28 DIAGNOSIS — Z79899 Other long term (current) drug therapy: Secondary | ICD-10-CM

## 2018-08-28 DIAGNOSIS — M00061 Staphylococcal arthritis, right knee: Secondary | ICD-10-CM

## 2018-08-28 LAB — CBC
HCT: 27.3 % — ABNORMAL LOW (ref 39.0–52.0)
Hemoglobin: 8.9 g/dL — ABNORMAL LOW (ref 13.0–17.0)
MCH: 32.5 pg (ref 26.0–34.0)
MCHC: 32.6 g/dL (ref 30.0–36.0)
MCV: 99.6 fL (ref 80.0–100.0)
NRBC: 0 % (ref 0.0–0.2)
Platelets: 203 10*3/uL (ref 150–400)
RBC: 2.74 MIL/uL — ABNORMAL LOW (ref 4.22–5.81)
RDW: 22.5 % — ABNORMAL HIGH (ref 11.5–15.5)
WBC: 22.4 10*3/uL — ABNORMAL HIGH (ref 4.0–10.5)

## 2018-08-28 LAB — BASIC METABOLIC PANEL
ANION GAP: 11 (ref 5–15)
BUN: 29 mg/dL — ABNORMAL HIGH (ref 6–20)
CHLORIDE: 102 mmol/L (ref 98–111)
CO2: 24 mmol/L (ref 22–32)
Calcium: 7.6 mg/dL — ABNORMAL LOW (ref 8.9–10.3)
Creatinine, Ser: 1.19 mg/dL (ref 0.61–1.24)
GFR calc Af Amer: >60 mL/min (ref >60)
GFR calc non Af Amer: >60 mL/min (ref >60)
Glucose, Bld: 145 mg/dL — ABNORMAL HIGH (ref 70–99)
Potassium: 4 mmol/L (ref 3.5–5.1)
SODIUM: 137 mmol/L (ref 135–145)

## 2018-08-28 LAB — URINE CULTURE: Culture: NO GROWTH

## 2018-08-28 LAB — LACTIC ACID, PLASMA: Lactic Acid, Venous: 2.5 mmol/L (ref 0.5–1.9)

## 2018-08-28 LAB — BPAM RBC
Blood Product Expiration Date: 202003192359
ISSUE DATE / TIME: 202003011525
Unit Type and Rh: 6200

## 2018-08-28 LAB — TYPE AND SCREEN
ABO/RH(D): AB POS
Antibody Screen: NEGATIVE
Unit division: 0

## 2018-08-28 LAB — GLUCOSE, BODY FLUID OTHER: Glucose, Body Fluid Other: <2 mg/dL

## 2018-08-28 LAB — PROTEIN, BODY FLUID (OTHER): Total Protein, Body Fluid Other: 1.8 g/dL

## 2018-08-28 LAB — MAGNESIUM: Magnesium: 2.4 mg/dL (ref 1.7–2.4)

## 2018-08-28 MED ORDER — BENZONATATE 100 MG PO CAPS: 100.0000 mg | ORAL_CAPSULE | Freq: Two times a day (BID) | ORAL | Status: DC | PRN

## 2018-08-28 NOTE — Evaluation (Signed)
Physical Therapy Evaluation Patient Details Name: Larry Andrade. MRN: [id number] DOB: May 13, 1961 Today's Date: 08/28/2018   History of Present Illness  Pt is a 58 y.o. male presenting to hospital 08/26/18 with increased knee pain and swelling, and weakness; recent hospital stay secondary CHF and gout flare.  Pt admitted with sepsis from R knee septic joint.  R knee aspirated in ED.  S/p R knee arthroscopic I&D with extensive synovectomy 08/26/18.  PMH includes CHF, gout, htn, EtOH liver cirrhosis.  Clinical Impression  Prior to hospital admission, pt was independent with ambulation (used DME as needed for ambulation d/t pain).  Pt lives with his wife and son in 1 level home with 3 steps to enter (no railing).  Currently pt is min assist with bed mobility; max assist x1 to stand from elevated bed (with multiple trials required to achieve standing); and CGA to min assist to ambulate bed to chair and then back to bed with RW (pt declined to sit in chair d/t reporting it would be too difficult to get out of and ambulation distance limited d/t reports of significant R knee pain).  8/10 R knee pain at rest beginning/end of session but increased pain reported with functional activity.  Pt also reporting L hand pain (d/t gout per pt report) which complicated pt's ability to perform mobility as well.  Pt would benefit from skilled PT to address noted impairments and functional limitations (see below for any additional details).  Upon hospital discharge, recommend pt discharge to STR (will continue to monitor pt's progress and anticipate with improved pain control pt's mobility with improve).    Follow Up Recommendations SNF    Equipment Recommendations  Rolling walker with 5" wheels    Recommendations for Other Services       Precautions / Restrictions Precautions Precautions: Fall Restrictions Weight Bearing Restrictions: Yes RLE Weight Bearing: Weight bearing as tolerated      Mobility  Bed  Mobility Overal bed mobility: Needs Assistance Bed Mobility: Supine to Sit;Sit to Supine     Supine to sit: Min assist;HOB elevated Sit to supine: Min assist;HOB elevated   General bed mobility comments: assist for R LE in/out of bed d/t R knee pain; increased time to perform and pt with difficulty using L hand d/t L hand pain; increased time for pt to scoot up in bed on own end of session (pt pulling up on siderail with R hand and using L elbow on bed to assist) with bed flat  Transfers Overall transfer level: Needs assistance Equipment used: Rolling walker (2 wheeled) Transfers: Sit to/from Stand Sit to Stand: Max assist         General transfer comment: x5 trials in order to eventually be able to stand from elevated bed with 1 assist; vc's for UE/LE placement  Ambulation/Gait Ambulation/Gait assistance: Min guard;Min assist Gait Distance (Feet): 8 Feet(bed to recliner and then back to bed) Assistive device: Rolling walker (2 wheeled) Gait Pattern/deviations: Antalgic Gait velocity: decreased   General Gait Details: decreased stance time R LE; R knee flexed; step to gait pattern  Stairs            Wheelchair Mobility    Modified Rankin (Stroke Patients Only)       Balance Overall balance assessment: Needs assistance Sitting-balance support: No upper extremity supported;Feet supported Sitting balance-Leahy Scale: Good Sitting balance - Comments: steady sitting reaching within BOS   Standing balance support: Bilateral upper extremity supported Standing balance-Leahy Scale: Poor Standing balance comment:  pt requiring B UE support for static standing balance                             Pertinent Vitals/Pain Pain Assessment: 0-10 Pain Score: 8  Pain Location: R knee Pain Descriptors / Indicators: Burning Pain Intervention(s): Limited activity within patient's tolerance;Monitored during session;Premedicated before session;Repositioned;Other  (comment)(polar care applied and activated)  HR increased up to 111 bpm with activity.    Home Living Family/patient expects to be discharged to:: Private residence Living Arrangements: Spouse/significant other;Children(wife and son) Available Help at Discharge: Family Type of Home: House Home Access: Stairs to enter Entrance Stairs-Rails: None Entrance Stairs-Number of Steps: 3 Home Layout: One level Home Equipment: Walker - 2 wheels;Crutches      Prior Function Level of Independence: Independent         Comments: Pt uses either axillary crutches or RW as needed for pain (otherwise does not use anything for ambulation).  Takes sponge baths d/t difficulty getting in/out of shower.     Hand Dominance        Extremity/Trunk Assessment   Upper Extremity Assessment Upper Extremity Assessment: (pt with difficulty using L hand d/t "gout" per pt report; swelling noted L hand; R UE WFL)    Lower Extremity Assessment Lower Extremity Assessment: LLE deficits/detail;RLE deficits/detail RLE Deficits / Details: R knee extension grossly 15 degrees short of neutral (limited d/t pain); R knee flexion grossly 60 degrees AROM (limited d/t pain); fair quad set; at least 3/5 R ankle DF/PF AROM RLE: Unable to fully assess due to pain LLE Deficits / Details: strength and ROM WFL    Cervical / Trunk Assessment Cervical / Trunk Assessment: Normal  Communication   Communication: No difficulties  Cognition Arousal/Alertness: Awake/alert Behavior During Therapy: WFL for tasks assessed/performed Overall Cognitive Status: Within Functional Limits for tasks assessed                                        General Comments   Nursing cleared pt for participation in physical therapy.  Pt agreeable to PT session but reports he wished he had not eaten such a big breakfast before therapy.    Exercises Total Joint Exercises Heel Slides: AAROM;Strengthening;Right;10 reps;Supine    Assessment/Plan    PT Assessment Patient needs continued PT services  PT Problem List Decreased strength;Decreased range of motion;Decreased activity tolerance;Decreased balance;Decreased mobility;Decreased knowledge of use of DME;Pain       PT Treatment Interventions DME instruction;Gait training;Stair training;Functional mobility training;Therapeutic activities;Therapeutic exercise;Balance training;Patient/family education    PT Goals (Current goals can be found in the Care Plan section)  Acute Rehab PT Goals Patient Stated Goal: to decrease R knee pain PT Goal Formulation: With patient Time For Goal Achievement: 09/11/18 Potential to Achieve Goals: Fair    Frequency Min 2X/week   Barriers to discharge Inaccessible home environment;Decreased caregiver support      Co-evaluation               AM-PAC PT "6 Clicks" Mobility  Outcome Measure Help needed turning from your back to your side while in a flat bed without using bedrails?: A Little Help needed moving from lying on your back to sitting on the side of a flat bed without using bedrails?: A Little Help needed moving to and from a bed to a chair (including a wheelchair)?: A  Little Help needed standing up from a chair using your arms (e.g., wheelchair or bedside chair)?: A Lot Help needed to walk in hospital room?: A Little Help needed climbing 3-5 steps with a railing? : Total 6 Click Score: 15    End of Session Equipment Utilized During Treatment: Gait belt Activity Tolerance: Patient limited by pain Patient left: in bed;with call bell/phone within reach;with bed alarm set;Other (comment)(B heels elevated via towel rolls; polar care in place and activated) Nurse Communication: Mobility status;Precautions;Weight bearing status;Other (comment)(Pt's pain status) PT Visit Diagnosis: Other abnormalities of gait and mobility (R26.89);Muscle weakness (generalized) (M62.81);History of falling (Z91.81);Difficulty in  walking, not elsewhere classified (R26.2);Pain Pain - Right/Left: Right Pain - part of body: Knee    Time: 1601-0932 PT Time Calculation (min) (ACUTE ONLY): 44 min   Charges:   PT Evaluation $PT Eval Low Complexity: 1 Low PT Treatments $Therapeutic Exercise: 8-22 mins $Therapeutic Activity: 8-22 mins       Hendricks Limes, PT 08/28/18, 10:17 AM [phone]

## 2018-08-28 NOTE — Clinical Social Work Note (Signed)
Clinical Social Work Assessment  Patient Details  Name: Larry Andrade. MRN: [id number] Date of Birth: [birth year]  Date of referral:  08/28/18               Reason for consult:  Facility Placement                Permission sought to share information with:  Chartered certified accountant granted to share information::  Yes, Verbal Permission Granted  Name::      [place]::   [place]   Relationship::     Contact Information:     Housing/Transportation Living arrangements for the past 2 months:  [place] of Information:  Patient Patient Interpreter Needed:  None Criminal Activity/Legal Involvement Pertinent to Current Situation/Hospitalization:  No - Comment as needed Significant Relationships:  Adult Children, Significant Other Lives with:  Adult Children, Significant Other Do you feel safe going back to the place where you live?  Yes Need for family participation in patient care:  Yes (Comment)  Care giving concerns:  Patient lives in [place] with his 56 y.o son and patient's girlfriend Larry Andrade of 23 years.    Social Worker assessment / plan:  Holiday representative (CSW) reviewed chart and noted that PT is recommending SNF. CSW met with patient alone at bedside to discuss D/C plan. Patient was alert and oriented X4 and was laying in the bed. CSW introduced self and explained role of CSW department. Per patient he lives in [place] with his 56 y.o son and patient's girlfriend Larry Andrade. CSW explained SNF process and that medicare requires a 3 night qualifying inpatient hospital stay in order to pay for SNF. Patient was admitted to inpatient 2/29. Patient verbalized his understanding and is agreeable to SNF search in [place]. FL2 complete and faxed out.  CSW presented bed offers to patient. He chose [facility]. [place] admissions coordinator at [facility] is aware of accepted bed offer and is aware that patient may need IV  ABX. CSW will continue to follow and assist as needed.   Employment status:  Disabled (Comment on whether or not currently receiving Disability) Insurance information:  Medicare PT Recommendations:  [place] / Referral to community resources:  [place]  Patient/Family's Response to care:  Patient chose [employer].   Patient/Family's Understanding of and Emotional Response to Diagnosis, Current Treatment, and Prognosis:  Patient was very pleasant and thanked CSW for assistance.   Emotional Assessment Appearance:  Appears stated age Attitude/Demeanor/Rapport:    Affect (typically observed):  Accepting, Adaptable, Pleasant Orientation:  Oriented to Self, Oriented to Place, Oriented to  Time, Oriented to Situation Alcohol / Substance use:  Not Applicable Psych involvement (Current and /or in the community):  No (Comment)  Discharge Needs  Concerns to be addressed:  Discharge Planning Concerns Readmission within the last 30 days:  No Current discharge risk:  Dependent with Mobility Barriers to Discharge:  Continued Medical Work up   [employer], Veronia Beets, LCSW 08/28/2018, 3:43 PM

## 2018-08-28 NOTE — Progress Notes (Signed)
Subjective:  POD #2 s/p arthroscopic I&D of right knee.  Patient reports left knee pain as mild to moderate.  Pain is improving.   WBC is improving.  Patient is afebrile.  Patient states he worked with physical therapy this AM.  Objective:   VITALS:   Vitals:   08/27/18 1702 08/27/18 1901 08/27/18 2338 08/28/18 0828  BP: 107/79 116/84 108/77 (!) 138/98  Pulse: 91 86 82 (!) 103  Resp: Temp: 98.4 F (36.9 C) 98.1 F (36.7 C) 97.9 F (36.6 C) 97.8 F (36.6 C)  TempSrc: Oral Oral Oral Oral  SpO2: 95% 97% 97% 95%  Weight:        PHYSICAL EXAM: Right knee:Incisions C/D/I.   Effusion improved.   Lower extremity edema improved.   No erythema or ecchymosis.   Diminished sensation to light touch in right lower leg/foot from baseline neuropathy.  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  LABS  Results for orders placed or performed during the hospital encounter of 08/26/18 (from the past 24 hour(s))  Lactic acid, plasma     Status: Abnormal   Collection Time: 08/27/18  2:04 PM  Result Value Ref Range   Lactic Acid, Venous 2.5 (HH) 0.5 - 1.9 mmol/L  Folate     Status: None   Collection Time: 08/27/18  2:23 PM  Result Value Ref Range   Folate 7.1 >5.9 ng/mL  Vitamin B12     Status: None   Collection Time: 08/27/18  2:23 PM  Result Value Ref Range   Vitamin B-12 861 180 - 914 pg/mL  CBC     Status: Abnormal   Collection Time: 08/27/18  8:14 PM  Result Value Ref Range   WBC 25.3 (H) 4.0 - 10.5 K/uL   RBC 2.92 (L) 4.22 - 5.81 MIL/uL   Hemoglobin 9.5 (L) 13.0 - 17.0 g/dL   HCT 81.1 (L) 91.4 - 78.2 %   MCV 99.3 80.0 - 100.0 fL   MCH 32.5 26.0 - 34.0 pg   MCHC 32.8 30.0 - 36.0 g/dL   RDW 95.6 (H) 21.3 - 08.6 %   Platelets 228 150 - 400 K/uL   nRBC 0.0 0.0 - 0.2 %  CBC     Status: Abnormal   Collection Time: 08/28/18  3:10 AM  Result Value Ref Range   WBC 22.4 (H) 4.0 - 10.5 K/uL   RBC 2.74 (L)  4.22 - 5.81 MIL/uL   Hemoglobin 8.9 (L) 13.0 - 17.0 g/dL   HCT 57.8 (L) 46.9 - 62.9 %   MCV 99.6 80.0 - 100.0 fL   MCH 32.5 26.0 - 34.0 pg   MCHC 32.6 30.0 - 36.0 g/dL   RDW 52.8 (H) 41.3 - 24.4 %   Platelets 203 150 - 400 K/uL   nRBC 0.0 0.0 - 0.2 %  Basic metabolic panel     Status: Abnormal   Collection Time: 08/28/18  3:10 AM  Result Value Ref Range   Sodium 137 135 - 145 mmol/L   Potassium 4.0 3.5 - 5.1 mmol/L   Chloride 102 98 - 111 mmol/L   CO2 24 22 - 32 mmol/L   Glucose, Bld 145 (H) 70 - 99 mg/dL   BUN 29 (H) 6 - 20 mg/dL   Creatinine, Ser 0.10 0.61 - 1.24 mg/dL   Calcium 7.6 (L) 8.9 - 10.3 mg/dL   GFR calc  non Af Amer >60 >60 mL/min   GFR calc Af Amer >60 >60 mL/min   Anion gap 11 5 - 15  Magnesium     Status: None   Collection Time: 08/28/18  3:10 AM  Result Value Ref Range   Magnesium 2.4 1.7 - 2.4 mg/dL    Dg Knee 1-2 Views Right  Result Date: 08/26/2018 CLINICAL DATA:  Knee pain.  No known injury.  Initial encounter. EXAM: RIGHT KNEE - 1-2 VIEW COMPARISON:  None. FINDINGS: A moderate to large knee effusion is noted. Tricompartmental degenerative changes are present. No acute fracture or dislocation. IMPRESSION: Moderate to large knee effusion without acute bony abnormality. Tricompartment degenerative changes. Electronically Signed   By: Harmon Pier M.D.   On: 08/26/2018 18:50   Ct Tibia Fibula Right W Contrast  Result Date: 08/26/2018 CLINICAL DATA:  Right lower extremity pain and swelling. EXAM: CT OF THE LOWER RIGHT EXTREMITY WITH CONTRAST TECHNIQUE: Multidetector CT imaging of the lower right extremity was performed according to the standard protocol following intravenous contrast administration. COMPARISON:  Radiographs 08/26/2018 CONTRAST:  OMNIPAQUE IOHEXOL 300 MG/ML  SOLN FINDINGS: There is a very large and complex right knee joint effusion with enhancing synovium and synovial calcifications. There is also some air in the joint but this may be related to  recent prior joint aspiration. There is also an erosive process involving the tibiofibular joint with a large erosion involving the fibular head and nearby adjacent tibia. 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 Similar findings at the ankle joint with a large complex joint effusion, synovial enhancement and nodular calcified tissue. There also scattered erosions involving the tibia, fibula and talus. Extensive erosive changes are also noted involving the cuneiforms. The anterior ankle tendons demonstrate thickening, nodularity and calcification which is also likely gout related. There is diffuse and fairly marked subcutaneous soft tissue swelling/edema/fluid involving the entire right lower extremity which would suggest cellulitis. No discrete rim enhancing drainable fluid collection to suggest an abscess. No findings for myofasciitis or pyomyositis. IMPRESSION: 1. Large complex joint effusions involving the knee joint and ankle joint as detailed above. I think the findings are most likely due to gout. Extensive tophaceous changes. Recommend correlation with recent joint aspirations. Could not totally exclude the possibility of superimposed septic arthritis. 2. Erosive changes and soft tissue tophi surrounding the tibiofibular joint proximally. 3. Advanced erosive changes involving the proximal and midfoot bony structures. 4. Tophaceous changes involving the anterior ankle tendons. 5. Diffuse subcutaneous soft tissue swelling/edema/fluid suggesting cellulitis. No definite findings for myofasciitis or pyomyositis. Electronically Signed   By: Rudie Meyer M.D.   On: 08/26/2018 19:23   US Venous Img Lower Unilateral Right  Result Date: 08/27/2018 CLINICAL DATA:  Right lower extremity swelling and pain. EXAM: RIGHT LOWER EXTREMITY VENOUS DOPPLER ULTRASOUND TECHNIQUE: Gray-scale sonography with graded compression, as well as color Doppler and duplex ultrasound were  performed to evaluate the lower extremity deep venous systems from the level of the common femoral vein and including the common femoral, femoral, profunda femoral, popliteal and calf veins including the posterior tibial, peroneal and gastrocnemius veins when visible. The superficial great saphenous vein was also interrogated. Spectral Doppler was utilized to evaluate flow at rest and with distal augmentation maneuvers in the common femoral, femoral and popliteal veins. COMPARISON:  None. FINDINGS: Contralateral Common Femoral Vein: Respiratory phasicity is normal and symmetric with the symptomatic side. No evidence of thrombus. Normal compressibility. Common Femoral Vein: No  evidence of thrombus. Normal compressibility, respiratory phasicity and response to augmentation. Saphenofemoral Junction: No evidence of thrombus. Normal compressibility and flow on color Doppler imaging. Profunda Femoral Vein: No evidence of thrombus. Normal compressibility and flow on color Doppler imaging. Femoral Vein: No evidence of thrombus. Normal compressibility, respiratory phasicity and response to augmentation. Popliteal Vein: No evidence of thrombus. Normal compressibility, respiratory phasicity and response to augmentation. Calf Veins: No evidence of thrombus. Normal compressibility and flow on color Doppler imaging. Superficial Great Saphenous Vein: No evidence of thrombus. Normal compressibility. Venous Reflux:  None. Other Findings:  None. IMPRESSION: No evidence of deep venous thrombosis. Electronically Signed   By: Myles Rosenthal M.D.   On: 08/27/2018 09:36   Dg Chest Port 1 View  Result Date: 08/26/2018 CLINICAL DATA:  Sepsis EXAM: PORTABLE CHEST 1 VIEW COMPARISON:  08/16/2018 chest radiograph FINDINGS: The cardiomediastinal silhouette is unremarkable. There is no evidence of focal airspace disease, pulmonary edema, suspicious pulmonary nodule/mass, pleural effusion, or pneumothorax. No acute bony abnormalities are  identified. Remote RIGHT rib fractures. IMPRESSION: No active disease. Electronically Signed   By: Harmon Pier M.D.   On: 08/26/2018 16:41    Assessment/Plan: 2 Days Post-Op   Active Problems:   Sepsis (HCC)  Improving right knee pain and swelling.   WBC decreased.  No fevers.   Continue vancomycin as he has grown out MRSA in blood cultures.  S.aureus also growing out of knee aspirate.   Continue PT as pain allows.   Patient would benefit from SNF placement upon discharge.   He is a significant fall risk.    Juanell Fairly , MD 08/28/2018, 1:15 PM

## 2018-08-28 NOTE — Progress Notes (Signed)
Pharmacy Electrolyte Monitoring Consult:  Pharmacy consulted to assist in monitoring and replacing electrolytes in this 58 y.o. male admitted on 08/26/2018 with Knee Pain  Patient currently ordered CIWA. Patient's MIVF is NS/Potassium 58mEq/L @ 43mL/hr.   Labs:  Sodium (mmol/L)  Date Value  08/28/2018 137   Potassium (mmol/L)  Date Value  08/28/2018 4.0   Magnesium (mg/dL)  Date Value  39/76/7341 2.4   Phosphorus (mg/dL)  Date Value  93/79/0240 5.1 (H)   Calcium (mg/dL)  Date Value  97/35/3299 7.6 (L)   Calcium, Total (PTH) (mg/dL)  Date Value  24/26/8341 9.4   Albumin (g/dL)  Date Value  96/22/2979 1.7 (L)    Assessment/Plan: No additional supplementation is needed at this time. Will follow up on AM labs.  Pharmacy will continue to monitor and adjust per consult.   Clovia Cuff, PharmD, BCPS 08/28/2018 9:49 AM

## 2018-08-28 NOTE — Anesthesia Postprocedure Evaluation (Signed)
Anesthesia Post Note  Patient: Larry Andrade.  Procedure(s) Performed: ARTHROSCOPY KNEE (Right )  Patient location during evaluation: PACU Anesthesia Type: General Level of consciousness: awake and alert Pain management: pain level controlled Vital Signs Assessment: post-procedure vital signs reviewed and stable Respiratory status: spontaneous breathing, nonlabored ventilation and respiratory function stable Cardiovascular status: blood pressure returned to baseline and stable Postop Assessment: no apparent nausea or vomiting Anesthetic complications: no     Last Vitals:  Vitals:   08/27/18 2338 08/28/18 0828  BP: 108/77 (!) 138/98  Pulse: 82 (!) 103  Resp: 16 18  Temp: 36.6 C 36.6 C  SpO2: 97% 95%    Last Pain:  Vitals:   08/28/18 0957  TempSrc:   PainSc: 7                  Jovita Gamma

## 2018-08-28 NOTE — Consult Note (Signed)
NAME: Larry Andrade.  DOB: February 18, 1961  MRN: [id number]  Date/Time: 08/28/2018 11:55 AM  REQUESTING PROVIDER: Juliene Pina Subjective:  REASON FOR CONSULT:  MRSA septic knee and MRSA bacteremia  Admitted with Rt knee pain ? [name]. is a 58 y.o. with a history of Gout, CHF, Cirrhosis liver was recently in [facility] between 2/19 and 2/24 with swollen legs  And was treated for CHF exacerbation. He also had rt and left  knee arthrocentesis for possible gout flare up but only left knee fluid sent for culture and crystals  and the fluid showed 2836 WBC ( 79% N) culture was  Negative. MSU ws positive.He  Received intra articular steroid injection He returned to the ED on 08/26/18 with severe rt knee pain. His vitals HR 126, BP 133/76  WBC was 35  Blood culture was sent and it came back positive for MRSA bacteremia and I am seeing the patient for the same. He had rt knee arthroscopic irrigation and debridement with extensive synovectomy on 2/29 He is on vancomycin    Past Medical History:  Diagnosis Date  . CHF (congestive heart failure) (HCC)   . Cirrhosis (HCC)   . GERD (gastroesophageal reflux disease)   . Gout   . Hypertension   . Pancreatitis     Past Surgical History:  Procedure Laterality Date  . CARDIAC CATHETERIZATION    . COLON SURGERY    . ESOPHAGOGASTRODUODENOSCOPY (EGD) WITH PROPOFOL N/A 10/26/2017   Procedure: ESOPHAGOGASTRODUODENOSCOPY (EGD) WITH PROPOFOL;  Surgeon: [name], MD;  Location: [facility];  Service: Endoscopy;  Laterality: N/A;  . KNEE ARTHROSCOPY Right 08/26/2018   Procedure: ARTHROSCOPY KNEE;  Surgeon: Juanell Fairly, MD;  Location: [facility] ORS;  Service: Orthopedics;  Laterality: Right;   SH somer Ex alcohol use - says stopped a few months ago  Family History  Problem Relation Age of Onset  . CVA Mother   . CAD Mother   . CAD Father    Allergies  Allergen Reactions  . Penicillins Rash    Has patient had a PCN reaction causing immediate rash,  facial/tongue/throat swelling, SOB or lightheadedness with hypotension: No Has patient had a PCN reaction causing severe rash involving mucus membranes or skin necrosis: No Has patient had a PCN reaction that required hospitalization: No Has patient had a PCN reaction occurring within the last 10 years: No If all of the above answers are "NO", then may proceed with Cephalosporin use.   ? Current Facility-Administered Medications  Medication Dose Route Frequency Provider Last Rate Last Dose  . 0.9 %  sodium chloride infusion (Manually program via Guardrails IV Fluids)   Intravenous Once Raliegh Scarlet, NP      . 0.9 % NaCl with KCl 20 mEq/ L  infusion   Intravenous Continuous Juanell Fairly, MD 75 mL/hr at 08/27/18 1938    . acetaminophen (TYLENOL) tablet 650 mg  650 mg Oral Q6H PRN Juanell Fairly, MD       Or  . acetaminophen (TYLENOL) suppository 650 mg  650 mg Rectal Q6H PRN Juanell Fairly, MD      . albuterol (PROVENTIL) (2.5 MG/3ML) 0.083% nebulizer solution 2.5 mg  2.5 mg Nebulization Q6H PRN Juanell Fairly, MD      . allopurinol (ZYLOPRIM) tablet 100 mg  100 mg Oral Daily Juanell Fairly, MD   100 mg at 08/28/18 0859  . alum & mag hydroxide-simeth (MAALOX/MYLANTA) 200-200-20 MG/5ML suspension 30 mL  30 mL Oral Q4H PRN Juanell Fairly, MD   30  mL at 08/27/18 1512  . benzonatate (TESSALON) capsule 100 mg  100 mg Oral BID PRN Lule, Joana, PA      . bisacodyl (DULCOLAX) suppository 10 mg  10 mg Rectal Daily PRN Juanell Fairly, MD      . colchicine tablet 0.6 mg  0.6 mg Oral Daily Juanell Fairly, MD   0.6 mg at 08/28/18 0857  . docusate sodium (COLACE) capsule 100 mg  100 mg Oral BID Juanell Fairly, MD      . enoxaparin (LOVENOX) injection 40 mg  40 mg Subcutaneous Q24H Juanell Fairly, MD   40 mg at 08/28/18 0858  . folic acid (FOLVITE) tablet 1 mg  1 mg Oral Daily Juanell Fairly, MD   1 mg at 08/28/18 0858  . furosemide (LASIX) tablet 20 mg  20 mg Oral BID Juanell Fairly, MD   20 mg at 08/28/18 0857  . guaiFENesin-dextromethorphan (ROBITUSSIN DM) 100-10 MG/5ML syrup 5 mL  5 mL Oral Q4H PRN Juanell Fairly, MD      . HYDROcodone-acetaminophen (NORCO) 7.5-325 MG per tablet 1-2 tablet  1-2 tablet Oral Q4H PRN Juanell Fairly, MD      . HYDROcodone-acetaminophen (NORCO/VICODIN) 5-325 MG per tablet 1-2 tablet  1-2 tablet Oral Q4H PRN Juanell Fairly, MD   2 tablet at 08/28/18 0857  . LORazepam (ATIVAN) tablet 1 mg  1 mg Oral Q6H PRN Juanell Fairly, MD       Or  . LORazepam (ATIVAN) injection 1 mg  1 mg Intravenous Q6H PRN Juanell Fairly, MD      . LORazepam (ATIVAN) tablet 0-4 mg  0-4 mg Oral Q6H Juanell Fairly, MD       Followed by  . LORazepam (ATIVAN) tablet 0-4 mg  0-4 mg Oral Q12H Juanell Fairly, MD      . magnesium citrate solution 1 Bottle  1 Bottle Oral Once PRN Juanell Fairly, MD      . menthol-cetylpyridinium (CEPACOL) lozenge 3 mg  1 lozenge Oral PRN Juanell Fairly, MD       Or  . phenol (CHLORASEPTIC) mouth spray 1 spray  1 spray Mouth/Throat PRN Juanell Fairly, MD      . methocarbamol (ROBAXIN) tablet 500 mg  500 mg Oral Q6H PRN Juanell Fairly, MD       Or  . methocarbamol (ROBAXIN) 500 mg in dextrose 5 % 50 mL IVPB  500 mg Intravenous Q6H PRN Juanell Fairly, MD      . metoCLOPramide (REGLAN) tablet 5-10 mg  5-10 mg Oral Q8H PRN Juanell Fairly, MD       Or  . metoCLOPramide (REGLAN) injection 5-10 mg  5-10 mg Intravenous Q8H PRN Juanell Fairly, MD      . metoprolol tartrate (LOPRESSOR) tablet 25 mg  25 mg Oral BID Juanell Fairly, MD   25 mg at 08/28/18 0857  . morphine 2 MG/ML injection 0.5-1 mg  0.5-1 mg Intravenous Q2H PRN Juanell Fairly, MD      . multivitamin with minerals tablet 1 tablet  1 tablet Oral Daily Juanell Fairly, MD   1 tablet at 08/28/18 0859  . ondansetron (ZOFRAN) tablet 4 mg  4 mg Oral Q6H PRN Juanell Fairly, MD       Or  . ondansetron [facility]) injection 4 mg  4 mg Intravenous Q6H PRN  Juanell Fairly, MD      . pantoprazole (PROTONIX) EC tablet 40 mg  40 mg Oral Daily Juanell Fairly, MD   40 mg at 08/28/18 0857  .  polyethylene glycol (MIRALAX / GLYCOLAX) packet 17 g  17 g Oral Daily PRN Juanell Fairly, MD      . predniSONE (DELTASONE) tablet 50 mg  50 mg Oral Q breakfast Juanell Fairly, MD   50 mg at 08/28/18 0857  . sodium chloride flush (NS) 0.9 % injection 3 mL  3 mL Intravenous Once Juanell Fairly, MD      . tamsulosin [facility]) capsule 0.4 mg  0.4 mg Oral Daily Juanell Fairly, MD   0.4 mg at 08/28/18 0857  . thiamine (VITAMIN B-1) tablet 100 mg  100 mg Oral Daily Juanell Fairly, MD   100 mg at 08/28/18 3151   Or  . thiamine (B-1) injection 100 mg  100 mg Intravenous Daily Juanell Fairly, MD      . vancomycin (VANCOCIN) IVPB 1000 mg/200 mL premix  1,000 mg Intravenous Q12H Mauri Reading, RPH 200 mL/hr at 08/28/18 0514 1,000 mg at 08/28/18 0514  . vancomycin (VANCOCIN) IVPB 750 mg/150 ml premix  750 mg Intravenous Once Juanell Fairly, MD      . vitamin B-12 (CYANOCOBALAMIN) tablet 1,000 mcg  1,000 mcg Oral Daily Juanell Fairly, MD   1,000 mcg at 08/28/18 7616     Abtx:  Anti-infectives (From admission, onward)   Start     Dose/Rate Route Frequency Ordered Stop   08/27/18 1800  vancomycin (VANCOCIN) IVPB 1000 mg/200 mL premix     1,000 mg 200 mL/hr over 60 Minutes Intravenous Every 12 hours 08/27/18 1645     08/27/18 0800  vancomycin (VANCOCIN) IVPB 1000 mg/200 mL premix  Status:  Discontinued     1,000 mg 200 mL/hr over 60 Minutes Intravenous Every 12 hours 08/26/18 1730 08/27/18 1645   08/27/18 0500  ceFEPIme (MAXIPIME) 2 g in sodium chloride 0.9 % 100 mL IVPB  Status:  Discontinued     2 g 200 mL/hr over 30 Minutes Intravenous Every 12 hours 08/26/18 1721 08/27/18 0642   08/26/18 1830  vancomycin (VANCOCIN) IVPB 750 mg/150 ml premix     750 mg 150 mL/hr over 60 Minutes Intravenous  Once 08/26/18 1730     08/26/18 1645  ceFAZolin (ANCEF) IVPB  2g/100 mL premix  Status:  Discontinued     2 g 200 mL/hr over 30 Minutes Intravenous  Once 08/26/18 1634 08/26/18 1634   08/26/18 1645  ceFEPIme (MAXIPIME) 2 g in sodium chloride 0.9 % 100 mL IVPB     2 g 200 mL/hr over 30 Minutes Intravenous  Once 08/26/18 1635 08/26/18 1738   08/26/18 1545  vancomycin (VANCOCIN) IVPB 1000 mg/200 mL premix     1,000 mg 200 mL/hr over 60 Minutes Intravenous  Once 08/26/18 1536 08/26/18 1657      REVIEW OF SYSTEMS:  Const: fever,  chills, negative weight loss Eyes: negative diplopia or visual changes, negative eye pain ENT: negative coryza, negative sore throat Resp: negative cough, hemoptysis, dyspnea Cards: negative for chest pain, palpitations, lower extremity edema GU: negative for frequency, dysuria and hematuria GI: Negative for abdominal pain, diarrhea, bleeding, constipation Skin: negative for rash and pruritus Heme: negative for easy bruising and gum/nose bleeding MS: positive for myalgias, arthralgias, back pain and muscle weakness Neurolo:negative for headaches, dizziness, vertigo, memory problems  Psych: negative for feelings of anxiety, depression  Endocrine: no polyuria or polydipsia Allergy/Immunology- negative for any medication or food allergies ? Objective:  VITALS:  BP (!) 138/98 (BP Location: Left Arm)   Pulse (!) 103   Temp 97.8 F (36.6 C) (  Oral)   Resp 18   Wt 90.2 kg   SpO2 95%   BMI 26.24 kg/m  PHYSICAL EXAM:  General: Alert, cooperative, no distress, appears stated age.  Head: Normocephalic, without obvious abnormality, atraumatic. Eyes: Conjunctivae clear, icteric sclerae. Pupils are equal ENT Nares normal. No drainage or sinus tenderness. Lips, mucosa, and tongue normal. No Thrush Neck: Supple, symmetrical, no adenopathy, thyroid: non tender no carotid bruit and no JVD. Back: No CVA tenderness. Lungs: b/l air entry- crepts bases Heart: s1s2 Abdomen: Soft,  distended. Lap scar Bowel sounds normal. No  masses Extremities: rt knee surgical dressing/drain in place, b/l edema legs Gouty tophi fingers- deformity + Swollen elbow Skin: No rashes or lesions. Or bruising Lymph: Cervical, supraclavicular normal. Neurologic: Grossly non-focal Pertinent Labs Lab Results CBC    Component Value Date/Time   WBC 22.4 (H) 08/28/2018 0310   RBC 2.74 (L) 08/28/2018 0310   HGB 8.9 (L) 08/28/2018 0310   HCT 27.3 (L) 08/28/2018 0310   PLT 203 08/28/2018 0310   MCV 99.6 08/28/2018 0310   MCH 32.5 08/28/2018 0310   MCHC 32.6 08/28/2018 0310   RDW 22.5 (H) 08/28/2018 0310   LYMPHSABS 1.9 08/26/2018 1451   MONOABS 1.2 (H) 08/26/2018 1451   EOSABS 0.1 08/26/2018 1451   BASOSABS 0.1 08/26/2018 1451    CMP Latest Ref Rng & Units 08/28/2018 08/27/2018 08/26/2018  Glucose 70 - 99 mg/dL 161(W) 960(A) 81  BUN 6 - 20 mg/dL 54(U) 98(J) 19(J)  Creatinine 0.61 - 1.24 mg/dL 4.78 2.95(A) 2.13(Y)  Sodium 135 - 145 mmol/L 137 136 135  Potassium 3.5 - 5.1 mmol/L 4.0 3.8 3.8  Chloride 98 - 111 mmol/L 102 102 98  CO2 22 - 32 mmol/L 24 23 21(L)  Calcium 8.9 - 10.3 mg/dL 7.6(L) 7.1(L) 7.9(L)  Total Protein 6.5 - 8.1 g/dL - 4.8(L) 6.3(L)  Total Bilirubin 0.3 - 1.2 mg/dL - 5.0(H) 4.9(H)  Alkaline Phos 38 - 126 U/L - 160(H) 225(H)  AST 15 - 41 U/L - 23 28  ALT 0 - 44 U/L - 13 17      Microbiology: Recent Results (from the past 240 hour(s))  Body fluid culture     Status: None   Collection Time: 08/18/18  4:18 PM  Result Value Ref Range Status   Specimen Description   Final    KNEE Performed at [facility], [address], [place], [place] [address]    Special Requests   Final    JOINT FLUID Performed at [facility], [address] [place], [place] [address]    Gram Stain   Final    RARE WBC PRESENT, PREDOMINANTLY PMN NO ORGANISMS SEEN    Culture   Final    NO GROWTH 3 DAYS Performed at [facility], [address] [place], [place] [address]    Report Status 08/22/2018  FINAL  Final  Blood culture (routine x 2)     Status: Abnormal (Preliminary result)   Collection Time: 08/26/18  3:25 PM  Result Value Ref Range Status   Specimen Description BLOOD LEFT ANTECUBITAL  Final   Special Requests   Final    BOTTLES DRAWN AEROBIC AND ANAEROBIC Blood Culture adequate volume   Culture  Setup Time   Final    GRAM POSITIVE COCCI IN BOTH AEROBIC AND ANAEROBIC BOTTLES CRITICAL RESULT CALLED TO, READ BACK BY AND VERIFIED WITH: DAVID BESANTI ON 08/27/2018 AT 0620 QSD Performed at [facility], [address] [place], [place] [address]    Culture STAPHYLOCOCCUS AUREUS (A)  Final   Report Status PENDING  Incomplete  Blood culture (routine x 2)     Status: Abnormal (Preliminary result)   Collection Time: 08/26/18  3:30 PM  Result Value Ref Range Status   Specimen Description   Final    BLOOD BLOOD RIGHT FOREARM Performed at [facility], [address], [place], [place] [address]    Special Requests   Final    BOTTLES DRAWN AEROBIC AND ANAEROBIC Blood Culture results may not be optimal due to an inadequate volume of blood received in culture bottles Performed at [facility], [address] [place], [place] [address]    Culture  Setup Time   Final    GRAM POSITIVE COCCI IN BOTH AEROBIC AND ANAEROBIC BOTTLES CRITICAL RESULT CALLED TO, READ BACK BY AND VERIFIED WITH: DAVID BESANTI ON 08/27/2018 AT 0620 QSD Performed at [facility], [address], [place], [place] [address]    Culture (A)  Final    STAPHYLOCOCCUS AUREUS SUSCEPTIBILITIES TO FOLLOW    Report Status PENDING  Incomplete  Blood Culture ID Panel (Reflexed)     Status: Abnormal   Collection Time: 08/26/18  3:30 PM  Result Value Ref Range Status   Enterococcus species NOT DETECTED NOT DETECTED Final   Listeria monocytogenes NOT DETECTED NOT DETECTED Final   Staphylococcus species DETECTED (A) NOT DETECTED Final    Comment: CRITICAL RESULT CALLED TO, READ BACK BY AND  VERIFIED WITH: DAVID BESANTI ON 08/27/2018 AT 0620 QSD    Staphylococcus aureus (BCID) DETECTED (A) NOT DETECTED Final    Comment: Methicillin (oxacillin)-resistant Staphylococcus aureus (MRSA). MRSA is predictably resistant to beta-lactam antibiotics (except ceftaroline). Preferred therapy is vancomycin unless clinically contraindicated. Patient requires contact precautions if  hospitalized. CRITICAL RESULT CALLED TO, READ BACK BY AND VERIFIED WITH: DAVID BESANTI ON 08/27/2018 AT 0620 QSD    Methicillin resistance DETECTED (A) NOT DETECTED Final    Comment: CRITICAL RESULT CALLED TO, READ BACK BY AND VERIFIED WITH: DAVID BESANTI ON 08/27/2018 AT 0620 QSD    Streptococcus species NOT DETECTED NOT DETECTED Final   Streptococcus agalactiae NOT DETECTED NOT DETECTED Final   Streptococcus pneumoniae NOT DETECTED NOT DETECTED Final   Streptococcus pyogenes NOT DETECTED NOT DETECTED Final   Acinetobacter baumannii NOT DETECTED NOT DETECTED Final   Enterobacteriaceae species NOT DETECTED NOT DETECTED Final   Enterobacter cloacae complex NOT DETECTED NOT DETECTED Final   Escherichia coli NOT DETECTED NOT DETECTED Final   Klebsiella oxytoca NOT DETECTED NOT DETECTED Final   Klebsiella pneumoniae NOT DETECTED NOT DETECTED Final   Proteus species NOT DETECTED NOT DETECTED Final   Serratia marcescens NOT DETECTED NOT DETECTED Final   Haemophilus influenzae NOT DETECTED NOT DETECTED Final   Neisseria meningitidis NOT DETECTED NOT DETECTED Final   Pseudomonas aeruginosa NOT DETECTED NOT DETECTED Final   Candida albicans NOT DETECTED NOT DETECTED Final   Candida glabrata NOT DETECTED NOT DETECTED Final   Candida krusei NOT DETECTED NOT DETECTED Final   Candida parapsilosis NOT DETECTED NOT DETECTED Final   Candida tropicalis NOT DETECTED NOT DETECTED Final    Comment: Performed at [facility], [address] [place], [place] [address]  Body fluid culture     Status: None (Preliminary result)    Collection Time: 08/26/18  4:19 PM  Result Value Ref Range Status   Specimen Description   Final    KNEE Performed at [facility], [address], [place], [place] [address]    Special Requests   Final    NONE Performed at [facility], [address] [place], [place] [address]    Gram Stain   Final    FEW WBC PRESENT, PREDOMINANTLY PMN ABUNDANT GRAM POSITIVE COCCI IN PAIRS IN CLUSTERS Performed at [facility], [address], [place], [place] [address]    Culture ABUNDANT STAPHYLOCOCCUS AUREUS  Final   Report Status PENDING  Incomplete  Urine culture     Status: None   Collection Time: 08/26/18  4:52 PM  Result Value Ref Range Status   Specimen Description   Final    URINE, RANDOM Performed at [facility], [address], [place], [place] [address]    Special Requests   Final    NONE Performed at [facility], [address], [place], [place] [address]    Culture   Final    NO GROWTH Performed at [facility], [address], [place], [place] [address]    Report Status 08/28/2018 FINAL  Final  MRSA PCR Screening     Status: None   Collection Time: 08/26/18  8:31 PM  Result Value Ref Range Status   MRSA by PCR NEGATIVE NEGATIVE Final    Comment:        The GeneXpert MRSA Assay (FDA approved for NASAL specimens only), is one component of a comprehensive MRSA colonization surveillance program. It is not intended to diagnose MRSA infection nor to guide or monitor treatment for MRSA infections. Performed at [facility], [address] [place], [place] [address]   Aerobic/Anaerobic Culture (surgical/deep wound)     Status: None (Preliminary result)   Collection Time: 08/26/18  9:51 PM  Result Value Ref Range Status   Specimen Description   Final    SYNOVIAL RIGHT KNEE Performed at [facility], [address], [place], [place] [address]    Special Requests   Final    NONE Performed at  [facility], [address] [place], [place] [address]    Gram Stain   Final    NO WBC SEEN FEW GRAM POSITIVE COCCI IN PAIRS IN CLUSTERS Performed at [facility], [address], [place], [place] [address]    Culture PENDING  Incomplete   Report Status PENDING  Incomplete    IMAGING RESULTS: 2 d echo 2/20 valves Okay  I have personally reviewed the films ? Impression/Recommendation ? 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 Gout- on allopurinol and colchicine and prednisone  CHF on frusemide On metoprolol Anemia Cirrhosis liver ___________________________________________________ Discussed with patient, in detail

## 2018-08-28 NOTE — Clinical Social Work Placement (Signed)
   CLINICAL SOCIAL WORK PLACEMENT  NOTE  Date:  08/28/2018  Patient Details  Name: Larry Andrade. MRN: [id number] Date of Birth: 11-Jan-1961  Clinical Social Work is seeking post-discharge placement for this patient at the Skilled  Nursing Facility level of care (*CSW will initial, date and re-position this form in  chart as items are completed):  Yes   Patient/family provided with [place] Clinical Social Work Department's list of facilities offering this level of care within the geographic area requested by the patient (or if unable, by the patient's family).  Yes   Patient/family informed of their freedom to choose among providers that offer the needed level of care, that participate in Medicare, Medicaid or managed care program needed by the patient, have an available bed and are willing to accept the patient.  Yes   Patient/family informed of [place]'s ownership interest in [facility] and [facility], as well as of the fact that they are under no obligation to receive care at these facilities.  PASRR submitted to EDS on 08/27/18     PASRR number received on 08/27/18     Existing PASRR number confirmed on       FL2 transmitted to all facilities in geographic area requested by pt/family on 08/28/18     FL2 transmitted to all facilities within larger geographic area on       Patient informed that his/her managed care company has contracts with or will negotiate with certain facilities, including the following:        Yes   Patient/family informed of bed offers received.  Patient chooses bed at [facility] )     Physician recommends and patient chooses bed at      Patient to be transferred to   on  .  Patient to be transferred to facility by       Patient family notified on   of transfer.  Name of family member notified:        PHYSICIAN       Additional Comment:    _______________________________________________ Seymore Brodowski, Darleen Crocker, LCSW 08/28/2018, 3:42  PM

## 2018-08-28 NOTE — Progress Notes (Addendum)
[employer] - [place] at [facility]   PATIENT NAME: Yostin Kraus    MR#:  [id number]  DATE OF BIRTH:  June 18, 1961  SUBJECTIVE:  CHIEF COMPLAINT:   Chief Complaint  Patient presents with  . Knee Pain   Pain in R leg less constant today, 7-8/10 when occurs. Burning sensation lessened. Eager to work with PT, echoed ortho orders that he is WBAT.  Cough unchanged from yesterday.  Some nausea/acid reflux last night. Has scheduled and PRN medications ordered. States he takes OTC omeprazole at home.  REVIEW OF SYSTEMS:  Review of Systems  Constitutional:  Negative forchills, fever, malaise/fatigue. HENT:Negative. Negative for ear discharge,ear pain,hearing loss,nosebleedsand sore throat.  Eyes:Negative. Negative for blurred visionand pain.  Respiratory: Positive forcough and sputum production (light yellow). Negative forhemoptysis,shortness of breathand wheezing.  Cardiovascular: Positive forleg swelling. Negative forchest pain,palpitations, orthopnea, PND.  Gastrointestinal:Negative. Negative for abdominal pain,blood in stool,diarrhea,nauseaand vomiting.  Genitourinary:Negative. Negative for dysuria.  Musculoskeletal: Positive forjoint pain. Negative forback pain.  Skin:Negative.  Neurological: Negative fordizziness,tremors,speech change,focal weakness,seizuresand headaches.  Endo/Heme/Allergies:Negative.Does not bruise/bleed easily.  Psychiatric/Behavioral:Negative. Negative for depression,hallucinationsand suicidal ideas.  DRUG ALLERGIES:   Allergies  Allergen Reactions  . Penicillins Rash    Has patient had a PCN reaction causing immediate rash, facial/tongue/throat swelling, SOB or lightheadedness with hypotension: No Has patient had a PCN reaction causing severe rash involving mucus membranes or skin necrosis: No Has patient had a PCN reaction that required hospitalization: No Has patient had a PCN reaction occurring  within the last 10 years: No If all of the above answers are "NO", then may proceed with Cephalosporin use.    VITALS:  Blood pressure (!) 138/98, pulse (!) 103, temperature 97.8 F (36.6 C), temperature source Oral, resp. rate 18, weight 90.2 kg, SpO2 95 %. PHYSICAL EXAMINATION:  Physical Exam Constitutional:  General: He is not in acute distress. HENT:  Head: Normocephalic.  Eyes:  General: No scleral icterus. Pupils: Pupils are equal, round, and reactive to light.  Neck:  Musculoskeletal: Normal range of motionand neck supple.  Vascular: No JVD.  Trachea: No tracheal deviation.  Cardiovascular:  Rate and Rhythm: Normal rateand regular rhythm.  Heart sounds: Normal heart sounds.No murmur. Nofriction rub. Nogallop.  Pulmonary:  Effort: Pulmonary effort is normal. Norespiratory distress.  Breath sounds: Normal breath sounds, upper airway sounds clear with coughing.    Nowheezingor rales.  Chest:  Chest wall: No tenderness.  Abdominal:  General: Bowel sounds are normal. There isdistension.  There is a ventral hernia, easily reducible, nontender.  There is mild tenderness to palpation at the epigastrium. Palpations: Abdomen is soft.   Tenderness: There is noguardingor rebound.  Musculoskeletal:  General: Swellingand deformitypresent.  Right lower leg: Edemapresent.  Comments: Edematous and tender to palpation from lower thigh to foot R lower extremity.  2+ pedal edema.  Improving. Swelling, warmth, erythema at the L wrist dorsal surface (existing gout flare per patient) Skin: General: Skin is warm.  Findings: No erythemaor rash otherwise. Neurological:  General: No focal deficitpresent.  Mental Status: He is alertand oriented to person, place, and time. Mental status isat baseline.  Psychiatric:  Judgment: Judgmentnormal.  LABORATORY PANEL:  Male CBC Recent Labs  Lab  08/28/18 0310  WBC 22.4*  HGB 8.9*  HCT 27.3*  PLT 203   ------------------------------------------------------------------------------------------------------------------ Chemistries  Recent Labs  Lab 08/27/18 0504 08/28/18 0310  NA 136 137  K 3.8 4.0  CL 102 102  CO2 23 24  GLUCOSE 117* 145*  BUN  27* 29*  CREATININE 1.32* 1.19  CALCIUM 7.1* 7.6*  MG 1.5* 2.4  AST 23  --   ALT 13  --   ALKPHOS 160*  --   BILITOT 5.0*  --    RADIOLOGY:  No results found. ASSESSMENT AND PLAN:   58 year old male with EtOH liver cirrhosis, chronic diastolic heart failure with preserved ejection fraction, ongoing tobacco abuse who was discharged from the hospital on 24 February with exacerbation of CHF and gout flare who presents today to the emergency room with right knee swelling and pain.  Plan:  1. Sepsis from right knee septic joint: Patient presented with tachycardia, tachypnea and leukocytosis Gram stain showing gram-positive cocci, blood culture growing MRSA, synovial fluid culture S Aureus. Afebrile today with downtrending white count. Continue vancomycin, pending formal ID consult Dr. Rivka Safer POD 2 s/p R knee arthroscopic I&D with Dr. Martha Clan Ordered CT of lower extremity and lower extremity Doppler to rule out DVT and as well as abscesses further down from the right knee due to edema, CT does not show signs of DVT and Doppler also shows no evidence of DVT. Lactic acid 2.5 from 2.3 most recently, ordered recheck and it is pending.  Continue IV fluids and oral intake. Seen by PT recommended SNF. Continue to work with PT as tolerated.  2. Chronic diastolic heart failure with preserved ejection fraction: No signs of exacerbation at this time. Continue to monitor. Continue Lasix.  3. EtOH abuse with liver cirrhosis: Patient reports his last drink was Wednesday CIWA protocol initiated.  No agitation overnight.  Continue to monitor. No ascites on recent abdominal  ultrasound  4.Hypomagnesemia, hypocalcemia: Repleted, recheck in a.m. Pharmacy consulted for electrolyte management. appreciate input.  5. Recent gout flare: Continue allopurinol and colchicine. Monitor for improvement.  Will consider steroid treatment short-term if not improved.   6. Essential hypertension: Continue metoprolol  7. Anemia Hbg 8.9 today. Macrocytic. Chronic EtOH abuse. 1 u pRBCs given yesterday. Recheck AM labs. Added on folate and B12 check, within normal limits at this time. CIWA protocol as above.   8.Tobacco dependence: Patient is encouraged to quit smoking. Counseling was provided and will be a discharge.  9. BPH: Continue tamsulosin  10. Cough PRNs ordered. Monitor clinically. Reassess for acute cardiopulmonary processes/CHF exacerbation as needed.  All the records are reviewed and case is discussed with Care Management/Social Worker. Management plans discussed with the patient and/or family and they are in agreement.  CODE STATUS: Full Code  TOTAL TIME TAKING CARE OF THIS PATIENT: 30 minutes.   More than 50% of the time was spent in counseling/coordination of care: YES  POSSIBLE D/C IN 2-3 DAYS, DEPENDING ON CLINICAL CONDITION.  Kalup Jaquith PA-C on 08/28/2018 at 1:34 PM  Between 7am to 6pm - Pager - [phone]  After 6 pm go to [email or web address] - Scientist, research (life sciences) [facility]  Office  [phone]  CC: Primary care physician; Gildardo Pounds, PA  Note: This dictation was prepared with Dragon dictation along with smaller phrase technology. Any transcriptional errors that result from this process are unintentional.lprog

## 2018-08-29 LAB — BASIC METABOLIC PANEL
Anion gap: 9 (ref 5–15)
BUN: 27 mg/dL — AB (ref 6–20)
CO2: 23 mmol/L (ref 22–32)
Calcium: 7.2 mg/dL — ABNORMAL LOW (ref 8.9–10.3)
Chloride: 102 mmol/L (ref 98–111)
Creatinine, Ser: 1.32 mg/dL — ABNORMAL HIGH (ref 0.61–1.24)
GFR calc Af Amer: >60 mL/min (ref >60)
GFR calc non Af Amer: >60 mL/min (ref >60)
Glucose, Bld: 144 mg/dL — ABNORMAL HIGH (ref 70–99)
Potassium: 4.4 mmol/L (ref 3.5–5.1)
Sodium: 134 mmol/L — ABNORMAL LOW (ref 135–145)

## 2018-08-29 LAB — CULTURE, BLOOD (ROUTINE X 2): Special Requests: ADEQUATE

## 2018-08-29 LAB — CBC
HCT: 25.9 % — ABNORMAL LOW (ref 39.0–52.0)
Hemoglobin: 8.4 g/dL — ABNORMAL LOW (ref 13.0–17.0)
MCH: 32.3 pg (ref 26.0–34.0)
MCHC: 32.4 g/dL (ref 30.0–36.0)
MCV: 99.6 fL (ref 80.0–100.0)
PLATELETS: 205 10*3/uL (ref 150–400)
RBC: 2.6 MIL/uL — ABNORMAL LOW (ref 4.22–5.81)
RDW: 21.6 % — ABNORMAL HIGH (ref 11.5–15.5)
WBC: 23.2 10*3/uL — ABNORMAL HIGH (ref 4.0–10.5)
nRBC: 0 % (ref 0.0–0.2)

## 2018-08-29 LAB — LACTIC ACID, PLASMA: Lactic Acid, Venous: 2 mmol/L (ref 0.5–1.9)

## 2018-08-29 LAB — HIV ANTIBODY (ROUTINE TESTING W REFLEX): HIV Screen 4th Generation wRfx: NONREACTIVE

## 2018-08-29 LAB — MAGNESIUM: Magnesium: 1.9 mg/dL (ref 1.7–2.4)

## 2018-08-29 LAB — VANCOMYCIN, PEAK: Vancomycin Pk: 38 ug/mL (ref 30–40)

## 2018-08-29 MED ORDER — ALBUTEROL SULFATE (2.5 MG/3ML) 0.083% IN NEBU
2.5000 mg | INHALATION_SOLUTION | RESPIRATORY_TRACT | Status: DC | PRN
  Administered 2018-09-02 – 2018-09-07 (×8): 2.5 mg via RESPIRATORY_TRACT
  Filled 2018-08-29 (×9): qty 3

## 2018-08-29 MED ORDER — IPRATROPIUM-ALBUTEROL 0.5-2.5 (3) MG/3ML IN SOLN
3.0000 mL | Freq: Four times a day (QID) | RESPIRATORY_TRACT | Status: DC
  Administered 2018-08-29 – 2018-08-30 (×5): 3 mL via RESPIRATORY_TRACT
  Filled 2018-08-29 (×5): qty 3

## 2018-08-29 NOTE — Progress Notes (Signed)
Plan is for patient to D/C to [facility] when medically stable. Rick admissions coordinator at [facility] is aware of above.   [employer], LCSW [phone]

## 2018-08-29 NOTE — Progress Notes (Signed)
Subjective:  POD #3 s/p arthroscopic right knee I&D.  Patient reports right knee pain as mild to moderate.  Patient OOB to a chair.  Objective:   VITALS:   Vitals:   08/28/18 0828 08/28/18 2304 08/29/18 0809 08/29/18 0854  BP: (!) 138/98 (!) 125/96 (!) 129/98   Pulse: (!) 103 91 94 (!) 101  Resp: 18 19 16 20   Temp: 97.8 F (36.6 C) 97.8 F (36.6 C) 98.8 F (37.1 C)   TempSrc: Oral Oral Oral   SpO2: 95% 98% 97% 96%  Weight:        PHYSICAL EXAM: Afebrile Neurovascular intact Sensation intact distally Intact pulses distally Dorsiflexion/Plantar flexion intact Incision: dressing C/D/I No cellulitis present Compartment soft  LABS  Results for orders placed or performed during the hospital encounter of 08/26/18 (from the past 24 hour(s))  Lactic acid, plasma     Status: Abnormal   Collection Time: 08/28/18  1:51 PM  Result Value Ref Range   Lactic Acid, Venous 2.5 (HH) 0.5 - 1.9 mmol/L  Lactic acid, plasma     Status: Abnormal   Collection Time: 08/29/18 12:47 AM  Result Value Ref Range   Lactic Acid, Venous 2.0 (HH) 0.5 - 1.9 mmol/L  CULTURE, BLOOD (ROUTINE X 2) w Reflex to ID Panel     Status: None (Preliminary result)   Collection Time: 08/29/18 12:48 AM  Result Value Ref Range   Specimen Description BLOOD LEFT ANTECUBITAL    Special Requests      BOTTLES DRAWN AEROBIC AND ANAEROBIC Blood Culture results may not be optimal due to an excessive volume of blood received in culture bottles   Culture      NO GROWTH < 12 HOURS Performed at [facility], [address] [place], [place] [address]    Report Status PENDING   CULTURE, BLOOD (ROUTINE X 2) w Reflex to ID Panel     Status: None (Preliminary result)   Collection Time: 08/29/18 12:48 AM  Result Value Ref Range   Specimen Description BLOOD BLOOD LEFT HAND    Special Requests      BOTTLES DRAWN AEROBIC AND ANAEROBIC Blood Culture adequate volume   Culture      NO GROWTH < 12 HOURS Performed at  [facility], [address] [place], [place] [address]    Report Status PENDING   CBC     Status: Abnormal   Collection Time: 08/29/18 12:49 AM  Result Value Ref Range   WBC 23.2 (H) 4.0 - 10.5 K/uL   RBC 2.60 (L) 4.22 - 5.81 MIL/uL   Hemoglobin 8.4 (L) 13.0 - 17.0 g/dL   HCT 95.2 (L) 84.1 - 32.4 %   MCV 99.6 80.0 - 100.0 fL   MCH 32.3 26.0 - 34.0 pg   MCHC 32.4 30.0 - 36.0 g/dL   RDW 40.1 (H) 02.7 - 25.3 %   Platelets 205 150 - 400 K/uL   nRBC 0.0 0.0 - 0.2 %  Basic metabolic panel     Status: Abnormal   Collection Time: 08/29/18 12:49 AM  Result Value Ref Range   Sodium 134 (L) 135 - 145 mmol/L   Potassium 4.4 3.5 - 5.1 mmol/L   Chloride 102 98 - 111 mmol/L   CO2 23 22 - 32 mmol/L   Glucose, Bld 144 (H) 70 - 99 mg/dL   BUN 27 (H) 6 - 20 mg/dL   Creatinine, Ser 6.64 (H) 0.61 - 1.24 mg/dL   Calcium 7.2 (L) 8.9 -  10.3 mg/dL   GFR calc non Af Amer >60 >60 mL/min   GFR calc Af Amer >60 >60 mL/min   Anion gap 9 5 - 15  Magnesium     Status: None   Collection Time: 08/29/18 12:49 AM  Result Value Ref Range   Magnesium 1.9 1.7 - 2.4 mg/dL    No results found.  Assessment/Plan: 3 Days Post-Op   Active Problems:   Sepsis [facility])   Patient improving from an orthopaedic standpoint.  Continue PT as pain allows.   He is WBAT on the right lower extremity.  Continue elevation and polar care of the right lower extremity.  Continue antibiotics per medicine.     Juanell Fairly , MD 08/29/2018, 11:00 AM

## 2018-08-29 NOTE — Progress Notes (Signed)
[employer] - [place] at [facility]   PATIENT NAME: Larry Andrade    MR#:  [id number]  DATE OF BIRTH:  1960/07/03  SUBJECTIVE:  CHIEF COMPLAINT:   Chief Complaint  Patient presents with  . Knee Pain   6/10 pain in the R knee and leg today + edema. Worked with PT yesterday, he would like to get up again and move to the chair today. + cough, productive. 3-4 weeks as previously documented. No PFTs on file. 1 pack a day smoker.  - fever, chills, nausea, vomiting Gout flare unchanged/stable   REVIEW OF SYSTEMS:  Review of Systems  Constitutional:  Negative forchills, fever, malaise/fatigue. HENT:Negative. Negative for ear discharge,ear pain,hearing loss,nosebleedsand sore throat.  Eyes:Negative. Negative for blurred visionand pain.  Respiratory: Positive forcoughand sputum production (light yellow). Negative forhemoptysis,shortness of breathand wheezing.  Cardiovascular: Positive forleg swelling. Negative forchest pain,palpitations, orthopnea,PND.  Gastrointestinal:Negative. Negative for abdominal pain,blood in stool,diarrhea,nauseaand vomiting.  Genitourinary:Negative. Negative for dysuria.  Musculoskeletal: Positive forjoint pain. Negative forback pain.  Skin:Negative.  Neurological: Negative fordizziness,tremors,speech change,focal weakness,seizuresand headaches.  Endo/Heme/Allergies:Negative.Does not bruise/bleed easily.  Psychiatric/Behavioral:Negative. Negative for depression,hallucinationsand suicidal ideas. DRUG ALLERGIES:   Allergies  Allergen Reactions  . Penicillins Rash    Has patient had a PCN reaction causing immediate rash, facial/tongue/throat swelling, SOB or lightheadedness with hypotension: No Has patient had a PCN reaction causing severe rash involving mucus membranes or skin necrosis: No Has patient had a PCN reaction that required hospitalization: No Has patient had a PCN reaction occurring  within the last 10 years: No If all of the above answers are "NO", then may proceed with Cephalosporin use.    VITALS:  Blood pressure (!) 129/98, pulse 97, temperature 98.8 F (37.1 C), temperature source Oral, resp. rate 18, weight 90.2 kg, SpO2 96 %. PHYSICAL EXAMINATION:  Physical Exam Constitutional:  General: He is not in acute distress. HENT:  Head: Normocephalic.  Eyes:  General: No scleral icterus. Pupils: Pupils are equal, round, and reactive to light.  Neck:  Musculoskeletal: Normal range of motionand neck supple.  Vascular: No JVD.  Trachea: No tracheal deviation.  Cardiovascular:  Rate and Rhythm: Normal rateand regular rhythm.  Heart sounds: Normal heart sounds.No murmur. Nofriction rub. Nogallop.  Pulmonary:  Effort: Pulmonary effort is normal. Norespiratory distress.  Breath sounds: Diffusely diminished breath sounds with expiratory wheezes present. Nowheezingor rales.  Chest:  Chest wall: No tenderness.  Abdominal:  General: Bowel sounds are normal. There isdistension.  There is a ventral hernia, easily reducible, nontender. Non-tender to palpation today. Palpations: Abdomen is soft.   Tenderness: There is noguardingor rebound.  Musculoskeletal:  General: Swellingand deformitypresent.  Right lower leg: Edemapresent.  Comments:Edematous and tender to palpation from lower thigh to foot R lower extremity.  2+ pedal edema R. Swelling, warmth, erythema at the L wrist dorsal surface (existing gout per patient). Unchanged.  Skin: General: Skin is warm.  Findings: No erythemaor rash otherwise. Neurological:  General: No focal deficitpresent.  Mental Status: He is alertand oriented to person, place, and time. Mental status isat baseline.  Psychiatric:  Judgment: Judgmentnormal. LABORATORY PANEL:  Male CBC Recent Labs  Lab 08/29/18 0049  WBC 23.2*  HGB 8.4*    HCT 25.9*  PLT 205   ------------------------------------------------------------------------------------------------------------------ Chemistries  Recent Labs  Lab 08/27/18 0504  08/29/18 0049  NA 136   < > 134*  K 3.8   < > 4.4  CL 102   < > 102  CO2 23   < >  23  GLUCOSE 117*   < > 144*  BUN 27*   < > 27*  CREATININE 1.32*   < > 1.32*  CALCIUM 7.1*   < > 7.2*  MG 1.5*   < > 1.9  AST 23  --   --   ALT 13  --   --   ALKPHOS 160*  --   --   BILITOT 5.0*  --   --    < > = values in this interval not displayed.   RADIOLOGY:  No results found. ASSESSMENT AND PLAN:    58 year old male with EtOH liver cirrhosis, chronic diastolic heart failure with preserved ejection fraction, ongoing tobacco abuse who was discharged from the hospital on 24 February with exacerbation of CHF and gout flare who presents today to the emergency room with right knee swelling and pain.  Plan: 1. Sepsis from right knee septic joint, resolving:Patient presentedwith tachycardia, tachypnea and leukocytosis Gram stain showing gram-positive cocci, blood culture growing MRSA, synovial fluid culture S Aureus. Afebrile today with WBC 23.3 (from 22.4) Continuevancomycin, pending formal ID consult Dr. Rivka Safer POD 3 s/p R knee arthroscopic I&D withDr. Martha Clan OrderedCT of lower extremity and lower extremity Doppler to rule out DVT and as well as abscesses further down from the right knee due to edema, CT does not show signs of DVT and Doppler also shows no evidence of DVT. Lactic acid 2.0 from 2.5 most recently, ordered recheck and it is pending.   Discontinued IV fluids today and encouraged oral intake. Seen by PT recommended SNF. Continue to work with PT as tolerated.  Reassess for needs as activity tolerance increases with pain control. Continue Polar Care.  2. Chronic diastolic heart failure with preserved ejection fraction:No signs of exacerbation at this time. Continue to  monitor. Continue Lasix.  3. EtOH abuse with liver cirrhosis: Patient reports his last drink was Wednesday CIWA protocol discontinued.  No agitation x 3 days No ascites on recent abdominal ultrasound.  4.Hypomagnesemia, hypocalcemia:Repleted,recheck in a.m.Pharmacy consulted for electrolyte management. appreciate input.  5. Gout:Continue allopurinol and colchicine. Monitor.   6. Essential hypertension:Continue metoprolol  7. Anemia Hbg 8.4 today. Macrocytic. Chronic EtOH abuse. 1 u pRBCs given yesterday. Recheck AM labs. Added on folate and B12 check, within normal limits at this time.  8.Tobacco dependence:Patient is encouraged to quit smoking. Counseling was provided and will be a discharge.  9. MWN:[id number] tamsulosin  10. CoughPRNs and breathing treatments ordered. Monitor clinically. Reassess for acute cardiopulmonary processes/CHF exacerbation as needed.   Given his smoking history and the duration of the cough suspect he has underlying chronic lung disease, question COPD. Did have a CXR recently, some scarring at the lung base only. Patient found follow-up outpatient for PFTs with PCP.    All the records are reviewed and case is discussed with Care Management/Social Worker. Management plans discussed with the patient and/or family and they are in agreement.  CODE STATUS: Full Code  TOTAL TIME TAKING CARE OF THIS PATIENT: 30 minutes.   More than 50% of the time was spent in counseling/coordination of care: YES  POSSIBLE D/C IN 2-3 DAYS, DEPENDING ON CLINICAL CONDITION, ID recommendations.   Harlee Eckroth PA-C on 08/29/2018 at 1:23 PM  Between 7am to 6pm - Pager - [phone]  After 6 pm go to [email or web address] - Scientist, research (life sciences) [facility]  Office  [phone]  CC: Primary care physician; Gildardo Pounds, PA  Note: This dictation was prepared with Office manager  along with smaller phrase technology. Any  transcriptional errors that result from this process are unintentional.

## 2018-08-29 NOTE — Progress Notes (Signed)
Pharmacy Electrolyte Monitoring Consult:  Pharmacy consulted to assist in monitoring and replacing electrolytes in this 58 y.o. male admitted on 08/26/2018 with Knee Pain  Patient currently ordered CIWA. Patient's MIVF is NS/Potassium 37mEq/L @ 74mL/hr.   Labs:  Sodium (mmol/L)  Date Value  08/29/2018 134 (L)   Potassium (mmol/L)  Date Value  08/29/2018 4.4   Magnesium (mg/dL)  Date Value  13/24/4010 1.9   Phosphorus (mg/dL)  Date Value  27/25/3664 5.1 (H)   Calcium (mg/dL)  Date Value  40/34/7425 7.2 (L)   Calcium, Total (PTH) (mg/dL)  Date Value  95/63/8756 9.4   Albumin (g/dL)  Date Value  43/32/9518 1.7 (L)    Assessment/Plan: No additional supplementation is needed at this time.   Will follow up K/Mg with AM labs.  Pharmacy will continue to monitor and adjust per consult.   Albina Billet, PharmD, BCPS Clinical Pharmacist 08/29/2018 7:49 AM

## 2018-08-30 DIAGNOSIS — D72829 Elevated white blood cell count, unspecified: Secondary | ICD-10-CM

## 2018-08-30 DIAGNOSIS — M109 Gout, unspecified: Secondary | ICD-10-CM

## 2018-08-30 LAB — CBC
HEMATOCRIT: 25.6 % — AB (ref 39.0–52.0)
Hemoglobin: 8.4 g/dL — ABNORMAL LOW (ref 13.0–17.0)
MCH: 32.7 pg (ref 26.0–34.0)
MCHC: 32.8 g/dL (ref 30.0–36.0)
MCV: 99.6 fL (ref 80.0–100.0)
Platelets: 201 10*3/uL (ref 150–400)
RBC: 2.57 MIL/uL — ABNORMAL LOW (ref 4.22–5.81)
RDW: 20.9 % — ABNORMAL HIGH (ref 11.5–15.5)
WBC: 22.3 10*3/uL — AB (ref 4.0–10.5)
nRBC: 0 % (ref 0.0–0.2)

## 2018-08-30 LAB — BASIC METABOLIC PANEL
Anion gap: 9 (ref 5–15)
BUN: 25 mg/dL — ABNORMAL HIGH (ref 6–20)
CO2: 23 mmol/L (ref 22–32)
Calcium: 7.6 mg/dL — ABNORMAL LOW (ref 8.9–10.3)
Chloride: 102 mmol/L (ref 98–111)
Creatinine, Ser: 1.08 mg/dL (ref 0.61–1.24)
GFR calc Af Amer: >60 mL/min (ref >60)
GFR calc non Af Amer: >60 mL/min (ref >60)
GLUCOSE: 98 mg/dL (ref 70–99)
Potassium: 4.7 mmol/L (ref 3.5–5.1)
Sodium: 134 mmol/L — ABNORMAL LOW (ref 135–145)

## 2018-08-30 LAB — BODY FLUID CULTURE

## 2018-08-30 LAB — MAGNESIUM: Magnesium: 1.8 mg/dL (ref 1.7–2.4)

## 2018-08-30 LAB — VANCOMYCIN, TROUGH
Vancomycin Tr: 31 ug/mL (ref 15–20)
Vancomycin Tr: 28 ug/mL (ref 15–20)

## 2018-08-30 MED ORDER — VANCOMYCIN VARIABLE DOSE PER UNSTABLE RENAL FUNCTION (PHARMACIST DOSING): Status: DC

## 2018-08-30 MED ORDER — VANCOMYCIN HCL IN DEXTROSE 1-5 GM/200ML-% IV SOLN: 1000.0000 mg | INTRAVENOUS | Status: DC | PRN

## 2018-08-30 MED ORDER — IPRATROPIUM-ALBUTEROL 0.5-2.5 (3) MG/3ML IN SOLN
3.0000 mL | Freq: Three times a day (TID) | RESPIRATORY_TRACT | Status: DC
  Administered 2018-08-30 – 2018-09-01 (×6): 3 mL via RESPIRATORY_TRACT
  Filled 2018-08-30 (×6): qty 3

## 2018-08-30 NOTE — Progress Notes (Signed)
Pharmacy Electrolyte Monitoring Consult:  Pharmacy consulted to assist in monitoring and replacing electrolytes in this 58 y.o. male admitted on 08/26/2018 with Knee Pain  Labs:  Sodium (mmol/L)  Date Value  08/30/2018 134 (L)   Potassium (mmol/L)  Date Value  08/30/2018 4.7   Magnesium (mg/dL)  Date Value  16/03/9603 1.8   Phosphorus (mg/dL)  Date Value  54/02/8118 5.1 (H)   Calcium (mg/dL)  Date Value  14/78/2956 7.6 (L)   Calcium, Total (PTH) (mg/dL)  Date Value  21/30/8657 9.4   Albumin (g/dL)  Date Value  84/69/6295 1.7 (L)    Assessment/Plan: No additional supplementation is needed at this time.   Patient electrolyte's WNL's x 2  Pharmacy will sign off at this time.  Albina Billet, PharmD, BCPS Clinical Pharmacist 08/30/2018 9:16 AM

## 2018-08-30 NOTE — Consult Note (Signed)
Pharmacy Antibiotic Note  Larry Andrade. is a 59 y.o. male admitted on 08/26/2018 with sepsis.  Patient has knee wound with pus.Patient also has concern for SBP. Discussed with ED physician to utilize broad spectrum antibiotics. Pharmacy has been consulted for Vancomycin + Cefepime dosing.  Plan: Ordered Cefepime 2 g IV q12h.  Patient received Vancomycin 1g IV in ED. Will order an additional 750 mg x 1 for a full load of 1750 mg  Will order Vancomycin maintenance dosing of 1000 mg IV q12h.  Kinetics Using IBW of 80 kg, Scr 1.34, and Vd 0.7 Goal AUC 400-550 Anticipated AUC 490.2   Weight: 198 lb 13.7 oz (90.2 kg)  Temp (24hrs), Avg:98.2 F (36.8 C), Min:97.8 F (36.6 C), Max:98.8 F (37.1 C)  Recent Labs  Lab 08/26/18 1451  08/26/18 1808 08/27/18 0504 08/27/18 0759 08/27/18 1404 08/27/18 2014 08/28/18 0310 08/28/18 1351 08/29/18 0047 08/29/18 0049 08/29/18 2046 08/30/18 0507  WBC 35.0*  --   --  27.4*  --   --  25.3* 22.4*  --   --  23.2*  --  22.3*  CREATININE 1.34*  --   --  1.32*  --   --   --  1.19  --   --  1.32*  --  1.08  LATICACIDVEN  --    < > 2.6*  --  2.3* 2.5*  --   --  2.5* 2.0*  --   --   --   VANCOTROUGH  --   --   --   --   --   --   --   --   --   --   --   --  31*  VANCOPEAK  --   --   --   --   --   --   --   --   --   --   --  38  --    < > = values in this interval not displayed.    Estimated Creatinine Clearance: 87.3 mL/min (by C-G formula based on SCr of 1.08 mg/dL).    Allergies  Allergen Reactions  . Penicillins Rash    Has patient had a PCN reaction causing immediate rash, facial/tongue/throat swelling, SOB or lightheadedness with hypotension: No Has patient had a PCN reaction causing severe rash involving mucus membranes or skin necrosis: No Has patient had a PCN reaction that required hospitalization: No Has patient had a PCN reaction occurring within the last 10 years: No If all of the above answers are "NO", then may proceed with  Cephalosporin use.     Antimicrobials this admission: 2/29 Cefepime >>  2/29 Vancomycin >>   Dose adjustments this admission: N/A  3/4 AM vanc trough 31. Hold dose. Recheck with tomorrow AM labs.  Microbiology results: 2/29 BCx: pending 2/29 UCx: pending  2/29 Fluid culture pending  Thank you for allowing pharmacy to be a part of this patient's care.   Erich Montane, PharmD Pharmacy Resident  08/30/2018 6:09 AM

## 2018-08-30 NOTE — Progress Notes (Signed)
[employer] - [place] [facility]   PATIENT NAME: Larry Andrade    MR#:  [id number]  DATE OF BIRTH:  04/04/1961  SUBJECTIVE:  CHIEF COMPLAINT:   Chief Complaint  Patient presents with  . Knee Pain   Sitting up in bed eating breakfast, post breathing treatment. Cough unchanged but improves with scheduled breathing treatments.   Pain in knee mild to moderate, was up in the chair yesterday. Eager to work with PT. Gout flare in wrist slightly improved. Expresses that he wants to get rehab before going home. Plan is for d/c to [place] once medically stable.  REVIEW OF SYSTEMS:  Review of Systems  Constitutional: Negative forchills, fever, malaise/fatigue. HENT:Negative. Negative for ear discharge,ear pain,hearing loss,nosebleedsand sore throat.  Eyes:Negative. Negative for blurred visionand pain.  Respiratory: Positive forcoughand sputum production (light yellow). Negative forhemoptysis,shortness of breathand wheezing.  Cardiovascular: Positive forleg swelling. Negative forchest pain,palpitations, orthopnea,PND.  Gastrointestinal:Negative. Negative for abdominal pain,blood in stool,diarrhea,nauseaand vomiting.  Genitourinary:Negative. Negative for dysuria.  Musculoskeletal: Positive forjoint pain. Negative forback pain.  Skin:Negative.  Neurological: Negative fordizziness,tremors,speech change,focal weakness,seizuresand headaches.  Endo/Heme/Allergies:Negative.Does not bruise/bleed easily.  Psychiatric/Behavioral:Negative. Negative for depression,hallucinationsand suicidal ideas. DRUG ALLERGIES:   Allergies  Allergen Reactions  . Penicillins Rash    Has patient had a PCN reaction causing immediate rash, facial/tongue/throat swelling, SOB or lightheadedness with hypotension: No Has patient had a PCN reaction causing severe rash involving mucus membranes or skin necrosis: No Has patient had a PCN reaction that  required hospitalization: No Has patient had a PCN reaction occurring within the last 10 years: No If all of the above answers are "NO", then may proceed with Cephalosporin use.    VITALS:  Blood pressure (!) 145/101, pulse (!) 108, temperature 97.8 F (36.6 C), temperature source Oral, resp. rate 17, weight 90.2 kg, SpO2 98 %. PHYSICAL EXAMINATION:  Physical Exam Constitutional:  General: He is not in acute distress. HENT:  Head: Normocephalic.  Eyes:  General: No scleral icterus. Pupils: Pupils are equal, round, and reactive to light.  Neck:  Musculoskeletal: Normal range of motionand neck supple.  Vascular: No JVD.  Trachea: No tracheal deviation.  Cardiovascular:  Rate and Rhythm: Normal rateand regular rhythm.  Heart sounds: Normal heart sounds.No murmur. Nofriction rub. Nogallop.  Pulmonary:  Effort: Pulmonary effort is normal. Norespiratory distress.  Breath sounds: Diffusely diminished breath sounds with slight expiratory wheezes present. Nowheezingor rales.  Chest:  Chest wall: No tenderness.  Abdominal:  General: Bowel sounds are normal. There isdistension.There is a ventral hernia, easily reducible, nontender. Non-tender to palpation today. Palpations: Abdomen is soft.  Tenderness: There is noguardingor rebound.  Musculoskeletal:  General: Swellingand deformitypresent.  Right lower leg: Edemapresent.  Comments:Edematous and tender to palpation from lower thigh to foot R lower extremity.1+ pedal edema R. Swelling, warmth, erythema at the L wrist dorsal surface (existing gout per patient). Slightly improved.  Skin: General: Skin is warm.  Findings: No erythemaor rashotherwise. Neurological:  General: No focal deficitpresent.  Mental Status: He is alertand oriented to person, place, and time. Mental status isat baseline.  Psychiatric:  Judgment:  Judgmentnormal. LABORATORY PANEL:  Male CBC Recent Labs  Lab 08/30/18 0507  WBC 22.3*  HGB 8.4*  HCT 25.6*  PLT 201   ------------------------------------------------------------------------------------------------------------------ Chemistries  Recent Labs  Lab 08/27/18 0504  08/30/18 0507  NA 136   < > 134*  K 3.8   < > 4.7  CL 102   < > 102  CO2 23   < >  23  GLUCOSE 117*   < > 98  BUN 27*   < > 25*  CREATININE 1.32*   < > 1.08  CALCIUM 7.1*   < > 7.6*  MG 1.5*   < > 1.8  AST 23  --   --   ALT 13  --   --   ALKPHOS 160*  --   --   BILITOT 5.0*  --   --    < > = values in this interval not displayed.   RADIOLOGY:  No results found. ASSESSMENT AND PLAN:   58 year old male with EtOH liver cirrhosis, chronic diastolic heart failure with preserved ejection fraction, ongoing tobacco abuse who was discharged from the hospital on 08/21/2018 with exacerbation of CHF and gout flare, who then presented to the emergency room with right knee swelling and pain.   Plan: 1. Sepsis from right knee septic joint, resolving:Patient presentedwith tachycardia, tachypnea and leukocytosis Gram stain showing gram-positive cocci, blood culture growing MRSA, synovial fluid cultureS Aureus. Afebrile today with WBC 23.3 Continuevancomycin, pending further recommendations with Dr. Rivka Safer after repeat blood culture ordered 08/29/2018. Will follow. POD4s/p R knee arthroscopic I&D withDr. Martha Clan OrderedCT of lower extremity and lower extremity Doppler to rule out DVT and as well as abscesses further down from the right knee due to edema, CT does not show signs of DVT and Doppler also shows no evidence of DVT. Lactic acid2.0 from 2.5 most recently.  Discontinued IV fluids and encouraged oral intake. Seen by PT recommended SNF.Continue towork with PT as tolerated.  Reassess for needs as activity tolerance increases with pain control. Continue Polar Care.  2. Chronic diastolic  heart failure with preserved ejection fraction:No signs of exacerbation at this time. Continue to monitor. Continue home Lasix.  3. EtOH abuse with liver cirrhosis: Patient reports his last drink was Wednesday CIWA protocol discontinued. No agitation x 4 days No ascites on recent abdominal ultrasound.  4.Hypomagnesemia, hypocalcemia:Repleted,recheck in a.m.Pharmacy initially consulted for electrolyte management.Signed off. appreciate input.  5. Gout:Continue allopurinol and colchicine. Monitor. Steroids ordered as well.    6. Essential hypertension:Continue metoprolol  7. Anemia Hbgstable again at 8.4today. Macrocytic. Chronic EtOH abuse. 1 u pRBCs given prior. Recheck AM labs. Added on folate and B12 check,within normal limits at this time.  8.Tobacco dependence:Patient is encouraged to quit smoking. Counseling was providedand will be a discharge.  9. IEP:PIRJJOAC tamsulosin  10. CoughPRNs and breathing treatments ordered. Monitor clinically. Reassess for acute cardiopulmonary processes/CHF exacerbation as needed.   Given his smoking history and the duration of the cough suspect he has underlying chronic lung disease, question COPD. Did have a CXR recently, some scarring at the lung base only. Patient will be encouraged to follow-up outpatient for PFTs with a PCP.    All the records are reviewed and case is discussed with Care Management/Social Worker. Management plans discussed with the patient and/or family and they are in agreement.  CODE STATUS: Full Code  TOTAL TIME TAKING CARE OF THIS PATIENT: 30 minutes.   More than 50% of the time was spent in counseling/coordination of care: YES  POSSIBLE D/C IN 1-2 DAYS, DEPENDING ON CLINICAL CONDITION, blood culture results and ID recommendations.   Danisha Brassfield PA-C on 08/30/2018 at 8:31 AM  Between 7am to 6pm - Pager - [phone]  After 6 pm go to [email or web address] - Scientist, research (life sciences)  [facility]  Office  [phone]  CC: Primary care physician; Gildardo Pounds, PA  Note: This  dictation was prepared with Dragon dictation along with smaller phrase technology. Any transcriptional errors that result from this process are unintentional.

## 2018-08-30 NOTE — Progress Notes (Signed)
Physical Therapy Treatment Patient Details Name: Larry Andrade. MRN: [id number] DOB: 1960-08-27 Today's Date: 08/30/2018    History of Present Illness 58 y.o. male presenting to hospital 08/26/18 with increased knee pain and swelling, and weakness; recent hospital stay secondary CHF and gout flare.  Pt admitted with sepsis from R knee septic joint.  R knee aspirated in ED.  S/p R knee arthroscopic I&D with extensive synovectomy 08/26/18.  PMH includes CHF, gout, htn, EtOH liver cirrhosis.    PT Comments    Pt was seen for gait and transfer training, and noted his efforts were good, improving from last visit.  Pt is expecting to go to SNF but hoping to go directly home.  Follow for strength, ROM of R knee and progressing training from in the room for gait to the hallway as precautions permit.   Follow Up Recommendations  SNF     Equipment Recommendations  Rolling walker with 5" wheels    Recommendations for Other Services       Precautions / Restrictions Precautions Precautions: Fall Restrictions Weight Bearing Restrictions: Yes RLE Weight Bearing: Weight bearing as tolerated    Mobility  Bed Mobility Overal bed mobility: Needs Assistance Bed Mobility: Supine to Sit     Supine to sit: Min assist     General bed mobility comments: assisted R leg carefully due to pain and stiffness  Transfers Overall transfer level: Needs assistance Equipment used: Rolling walker (2 wheeled);1 person hand held assist Transfers: Sit to/from Stand Sit to Stand: Mod assist         General transfer comment: cued hand placement due to his habits to stand  Ambulation/Gait Ambulation/Gait assistance: Min guard Gait Distance (Feet): 40 Feet Assistive device: Rolling walker (2 wheeled) Gait Pattern/deviations: Antalgic Gait velocity: decreased Gait velocity interpretation: <1.31 ft/sec, indicative of household ambulator General Gait Details: decreased stance time R LE; R knee flexed; step  to gait pattern   Stairs             Wheelchair Mobility    Modified Rankin (Stroke Patients Only)       Balance Overall balance assessment: Needs assistance Sitting-balance support: Feet supported Sitting balance-Leahy Scale: Good     Standing balance support: Bilateral upper extremity supported;During functional activity Standing balance-Leahy Scale: Fair Standing balance comment: fair once standing but not until steadied                            Cognition Arousal/Alertness: Awake/alert Behavior During Therapy: WFL for tasks assessed/performed Overall Cognitive Status: Within Functional Limits for tasks assessed                                        Exercises General Exercises - Lower Extremity Ankle Circles/Pumps: AROM;Both;5 reps Quad Sets: AROM;Both;10 reps Heel Slides: AAROM    General Comments General comments (skin integrity, edema, etc.): R knee is edematous and painful to flex      Pertinent Vitals/Pain Pain Assessment: 0-10 Pain Score: 8  Pain Location: R knee Pain Descriptors / Indicators: Operative site guarding Pain Intervention(s): Limited activity within patient's tolerance;Monitored during session;Premedicated before session;Repositioned    Home Living                      Prior Function            PT  Goals (current goals can now be found in the care plan section) Acute Rehab PT Goals Patient Stated Goal: to decrease R knee pain Progress towards PT goals: Progressing toward goals    Frequency    Min 2X/week      PT Plan Current plan remains appropriate    Co-evaluation              AM-PAC PT "6 Clicks" Mobility   Outcome Measure  Help needed turning from your back to your side while in a flat bed without using bedrails?: A Little Help needed moving from lying on your back to sitting on the side of a flat bed without using bedrails?: A Little Help needed moving to and from a  bed to a chair (including a wheelchair)?: A Little Help needed standing up from a chair using your arms (e.g., wheelchair or bedside chair)?: A Little Help needed to walk in hospital room?: A Little Help needed climbing 3-5 steps with a railing? : A Lot 6 Click Score: 17    End of Session Equipment Utilized During Treatment: Gait belt Activity Tolerance: Patient limited by pain Patient left: with call bell/phone within reach;Other (comment);in chair;with chair alarm set(pt asked to remove the polar care while sitting) Nurse Communication: Mobility status;Precautions;Weight bearing status;Other (comment) PT Visit Diagnosis: Other abnormalities of gait and mobility (R26.89);Muscle weakness (generalized) (M62.81);History of falling (Z91.81);Difficulty in walking, not elsewhere classified (R26.2);Pain Pain - Right/Left: Right Pain - part of body: Knee     Time: 3361-2244 PT Time Calculation (min) (ACUTE ONLY): 37 min  Charges:  $Gait Training: 8-22 mins $Therapeutic Exercise: 8-22 mins                    [name] 08/30/2018, 4:51 PM  [name], PT MS Acute Rehab Dept. Number: [facility] [id number] and [facility] [phone]

## 2018-08-30 NOTE — Progress Notes (Signed)
Subjective:  POD #4 s/p arthroscopic right knee I&D.  Patient reports right knee pain as mild to moderate.  No other complaints.  Objective:   VITALS:   Vitals:   08/30/18 0803 08/30/18 0825 08/30/18 1156 08/30/18 1538  BP:  (!) 145/101  (!) 139/93  Pulse: (!) 105 (!) 108 99 99  Resp: 20 17 18 17   Temp:  97.8 F (36.6 C)  (!) 97.4 F (36.3 C)  TempSrc:  Oral  Oral  SpO2: 98% 98% 99% 98%  Weight:        PHYSICAL EXAM: Right lower extremity: Dressing is clean dry and intact. Edema in the right lower leg is improved. Patient has intact motor function distally. Patient is at baseline which is diminished sensation in both feet due to peripheral neuropathy. Feet are well-perfused.   LABS  Results for orders placed or performed during the hospital encounter of 08/26/18 (from the past 24 hour(s))  Vancomycin, peak     Status: None   Collection Time: 08/29/18  8:46 PM  Result Value Ref Range   Vancomycin Pk 38 30 - 40 ug/mL  Basic metabolic panel     Status: Abnormal   Collection Time: 08/30/18  5:07 AM  Result Value Ref Range   Sodium 134 (L) 135 - 145 mmol/L   Potassium 4.7 3.5 - 5.1 mmol/L   Chloride 102 98 - 111 mmol/L   CO2 23 22 - 32 mmol/L   Glucose, Bld 98 70 - 99 mg/dL   BUN 25 (H) 6 - 20 mg/dL   Creatinine, Ser 1.85 0.61 - 1.24 mg/dL   Calcium 7.6 (L) 8.9 - 10.3 mg/dL   GFR calc non Af Amer >60 >60 mL/min   GFR calc Af Amer >60 >60 mL/min   Anion gap 9 5 - 15  Magnesium     Status: None   Collection Time: 08/30/18  5:07 AM  Result Value Ref Range   Magnesium 1.8 1.7 - 2.4 mg/dL  Vancomycin, trough     Status: Abnormal   Collection Time: 08/30/18  5:07 AM  Result Value Ref Range   Vancomycin Tr 31 (HH) 15 - 20 ug/mL  CBC     Status: Abnormal   Collection Time: 08/30/18  5:07 AM  Result Value Ref Range   WBC 22.3 (H) 4.0 - 10.5 K/uL   RBC 2.57 (L) 4.22 - 5.81 MIL/uL   Hemoglobin 8.4 (L) 13.0 - 17.0 g/dL   HCT 63.1 (L) 49.7 - 02.6 %   MCV 99.6 80.0 -  100.0 fL   MCH 32.7 26.0 - 34.0 pg   MCHC 32.8 30.0 - 36.0 g/dL   RDW 37.8 (H) 58.8 - 50.2 %   Platelets 201 150 - 400 K/uL   nRBC 0.0 0.0 - 0.2 %  Vancomycin, trough     Status: Abnormal   Collection Time: 08/30/18  9:41 AM  Result Value Ref Range   Vancomycin Tr 28 (HH) 15 - 20 ug/mL    No results found.  Assessment/Plan: 4 Days Post-Op   Active Problems:   Sepsis (HCC)  Patient progressing well from an orthopedic standpoint.  Continue with physical therapy as pain allows.  Change dressings as needed.  Patient is weightbearing as tolerated on the right lower extremity.  Agree with skilled nursing facility placement after discharge.  Patient may follow-up in my office in 7 to 10 days after discharge for wound check and suture removal.  Continue antibiotics per infectious disease.  Juanell Fairly , MD 08/30/2018, 8:01 PM

## 2018-08-30 NOTE — Progress Notes (Signed)
   Date of Admission:  08/26/2018   *   ID: Larry Andrade. is a 58 y.o. male  Active Problems:   Sepsis (HCC) rt knee MRSA septic arthritis MRSA bacteremia  Subjective: Still has rt kne epain  Medications:  . sodium chloride   Intravenous Once  . allopurinol  100 mg Oral Daily  . colchicine  0.6 mg Oral Daily  . docusate sodium  100 mg Oral BID  . enoxaparin (LOVENOX) injection  40 mg Subcutaneous Q24H  . folic acid  1 mg Oral Daily  . furosemide  20 mg Oral BID  . ipratropium-albuterol  3 mL Nebulization TID  . LORazepam  0-4 mg Oral Q12H  . metoprolol tartrate  25 mg Oral BID  . multivitamin with minerals  1 tablet Oral Daily  . pantoprazole  40 mg Oral Daily  . predniSONE  50 mg Oral Q breakfast  . sodium chloride flush  3 mL Intravenous Once  . tamsulosin  0.4 mg Oral Daily  . thiamine  100 mg Oral Daily   Or  . thiamine  100 mg Intravenous Daily  . vancomycin variable dose per unstable renal function (pharmacist dosing)   Does not apply See admin instructions  . cyanocobalamin  1,000 mcg Oral Daily    Objective: Vital signs in last 24 hours: Temp:  [97.8 F (36.6 C)-98.1 F (36.7 C)] 97.8 F (36.6 C) (03/04 0825) Pulse Rate:  [85-108] 99 (03/04 1156) Resp:  [12-21] 18 (03/04 1156) BP: (133-145)/(89-101) 145/101 (03/04 0825) SpO2:  [96 %-99 %] 99 % (03/04 1156)  PHYSICAL EXAM:  General: Alert, cooperative, no distress, appears stated age.  Lungs: Clear to auscultation bilaterally. No Wheezing or Rhonchi. No rales. Heart: Regular rate and rhythm, no murmur, rub or gallop. Abdomen: Soft,  distended. Bowel sounds normal. No masses Extremities: rt knee- dressing not removed Skin: multiple bruises Lymph: Cervical, supraclavicular normal. Neurologic: Grossly non-focal  Lab Results Recent Labs    08/29/18 0049 08/30/18 0507  WBC 23.2* 22.3*  HGB 8.4* 8.4*  HCT 25.9* 25.6*  NA 134* 134*  K 4.4 4.7  CL 102 102  CO2 23 23  BUN 27* 25*  CREATININE 1.32*  1.08   Liver Panel No results for input(s): PROT, ALBUMIN, AST, ALT, ALKPHOS, BILITOT, BILIDIR, IBILI in the last 72 hours. Sedimentation Rate No results for input(s): ESRSEDRATE in the last 72 hours. C-Reactive Protein No results for input(s): CRP in the last 72 hours.  Microbiology: 2/29 [facility] MRSA  2/29 Rt knee MRSA Studies/Results: No results found.   Assessment/Plan: MRSA bacteremia with MRSA septic arthritis rt knee- on vancomycin- MIC 0.5 Recent b/l knee arthrocentesis and kenalog injection- Rt knee fluid was not sent for culture , hence not sure how long he had the MRSA in the knee and also no blood culture was sent last time So he will need TEE to r/o endocarditis repeat blood culture pending Will need IV for atleast 6 weeks depending on TEE  Leucocytosis persisting  ? Gout- on allopurinol and colchicine and prednisone  CHF on frusemide On metoprolol Anemia Cirrhosis liver ___________________________________________________ Discussed with patient, in detail

## 2018-08-31 ENCOUNTER — Inpatient Hospital Stay
Admit: 2018-08-31 | Discharge: 2018-08-31 | Disposition: A | Discharge status: Home / Self Care | Payer: Medicare Other | Attending: Cardiology | Admitting: Cardiology

## 2018-08-31 LAB — CBC
HCT: 25.9 % — ABNORMAL LOW (ref 39.0–52.0)
Hemoglobin: 8.5 g/dL — ABNORMAL LOW (ref 13.0–17.0)
MCH: 32.3 pg (ref 26.0–34.0)
MCHC: 32.8 g/dL (ref 30.0–36.0)
MCV: 98.5 fL (ref 80.0–100.0)
Platelets: 202 10*3/uL (ref 150–400)
RBC: 2.63 MIL/uL — ABNORMAL LOW (ref 4.22–5.81)
RDW: 20.6 % — ABNORMAL HIGH (ref 11.5–15.5)
WBC: 21.7 10*3/uL — ABNORMAL HIGH (ref 4.0–10.5)
nRBC: 0 % (ref 0.0–0.2)

## 2018-08-31 LAB — VANCOMYCIN, RANDOM: Vancomycin Rm: 15

## 2018-08-31 LAB — BASIC METABOLIC PANEL
Anion gap: 8 (ref 5–15)
BUN: 23 mg/dL — ABNORMAL HIGH (ref 6–20)
CO2: 25 mmol/L (ref 22–32)
Calcium: 7.8 mg/dL — ABNORMAL LOW (ref 8.9–10.3)
Chloride: 99 mmol/L (ref 98–111)
Creatinine, Ser: 1.07 mg/dL (ref 0.61–1.24)
GFR calc Af Amer: >60 mL/min (ref >60)
GFR calc non Af Amer: >60 mL/min (ref >60)
Glucose, Bld: 118 mg/dL — ABNORMAL HIGH (ref 70–99)
POTASSIUM: 4.1 mmol/L (ref 3.5–5.1)
Sodium: 132 mmol/L — ABNORMAL LOW (ref 135–145)

## 2018-08-31 MED ORDER — LIDOCAINE VISCOUS HCL 2 % MT SOLN
OROMUCOSAL | Status: AC
  Filled 2018-08-31: qty 15

## 2018-08-31 MED ORDER — SODIUM CHLORIDE 0.9 % IV SOLN: INTRAVENOUS | Status: DC

## 2018-08-31 MED ORDER — FENTANYL CITRATE (PF) 100 MCG/2ML IJ SOLN
INTRAMUSCULAR | Status: AC | PRN
  Administered 2018-08-31 (×2): 25 ug via INTRAVENOUS
  Administered 2018-08-31: 50 ug via INTRAVENOUS

## 2018-08-31 MED ORDER — FENTANYL CITRATE (PF) 100 MCG/2ML IJ SOLN
INTRAMUSCULAR | Status: AC
  Filled 2018-08-31: qty 2

## 2018-08-31 MED ORDER — MIDAZOLAM HCL 5 MG/5ML IJ SOLN
INTRAMUSCULAR | Status: AC | PRN
  Administered 2018-08-31 (×2): 1 mg via INTRAVENOUS
  Administered 2018-08-31: 2 mg via INTRAVENOUS
  Administered 2018-08-31: 1 mg via INTRAVENOUS

## 2018-08-31 MED ORDER — SODIUM CHLORIDE FLUSH 0.9 % IV SOLN
INTRAVENOUS | Status: AC
  Filled 2018-08-31: qty 10

## 2018-08-31 MED ORDER — MIDAZOLAM HCL 5 MG/5ML IJ SOLN
INTRAMUSCULAR | Status: AC
  Filled 2018-08-31: qty 5

## 2018-08-31 MED ORDER — VANCOMYCIN HCL 10 G IV SOLR
1750.0000 mg | INTRAVENOUS | Status: DC
  Administered 2018-08-31: 1750 mg via INTRAVENOUS
  Filled 2018-08-31 (×2): qty 1750

## 2018-08-31 MED ORDER — BUTAMBEN-TETRACAINE-BENZOCAINE 2-2-14 % EX AERO
INHALATION_SPRAY | CUTANEOUS | Status: AC | PRN
  Administered 2018-08-31: 3 via TOPICAL

## 2018-08-31 MED ORDER — LIDOCAINE VISCOUS HCL 2 % MT SOLN
OROMUCOSAL | Status: AC | PRN
  Administered 2018-08-31: 15 mL via OROMUCOSAL

## 2018-08-31 NOTE — Consult Note (Signed)
Pharmacy Antibiotic Note  Larry Andrade. is a 58 y.o. male admitted on 08/26/2018 with sepsis.  Patient has knee wound with pus.  Patient also has concern for SBP. Discussed with ED physician to utilize broad spectrum antibiotics. Pharmacy has been consulted for Vancomycin dosing.  Plan: Patient originally on Vancomycin maintenance dosing of 1000 mg IV q12h.  Estimated Kinetics Using IBW of 80 kg, Scr 1.34, and Vd 0.7 Goal AUC 400-550 Anticipated AUC 490.2  However, based on assessing several levels, pt doesn't clear vancomycin as expected from renal function - Vancomycin was held for 2 doses while waiting for trough to reach acceptable level for redosing  3/3 1707 last dose  (at 1g q12) 3/3 2046 Vanc peak 38 3/4 0507 Vanc trough 31 (redrew in case error) 3/4 0941 Vanc trough 28 (didn't appear to be error) 3/5 0414 Vanc random 15  Running Vancomycin 2 Level PK Calculator  Ke = 0.0295, t 1/2 = 23.5 hr  Will dose starting 03/5 @ 0900  q36h   Weight: 198 lb 13.7 oz (90.2 kg)  Temp (24hrs), Avg:97.7 F (36.5 C), Min:97.4 F (36.3 C), Max:97.8 F (36.6 C)  Recent Labs  Lab 08/26/18 1808 08/27/18 0504 08/27/18 0759 08/27/18 1404 08/27/18 2014 08/28/18 0310 08/28/18 1351 08/29/18 0047 08/29/18 0049 08/29/18 2046 08/30/18 0507 08/30/18 0941 08/31/18 0414  WBC  --  27.4*  --   --  25.3* 22.4*  --   --  23.2*  --  22.3*  --  21.7*  CREATININE  --  1.32*  --   --   --  1.19  --   --  1.32*  --  1.08  --  1.07  LATICACIDVEN 2.6*  --  2.3* 2.5*  --   --  2.5* 2.0*  --   --   --   --   --   VANCOTROUGH  --   --   --   --   --   --   --   --   --   --  31* 28*  --   VANCOPEAK  --   --   --   --   --   --   --   --   --  8  --   --   --   VANCORANDOM  --   --   --   --   --   --   --   --   --   --   --   --  15    Estimated Creatinine Clearance: 88.2 mL/min (by C-G formula based on SCr of 1.07 mg/dL).    Allergies  Allergen Reactions  . Penicillins Rash     Has patient had a PCN reaction causing immediate rash, facial/tongue/throat swelling, SOB or lightheadedness with hypotension: No Has patient had a PCN reaction causing severe rash involving mucus membranes or skin necrosis: No Has patient had a PCN reaction that required hospitalization: No Has patient had a PCN reaction occurring within the last 10 years: No If all of the above answers are "NO", then may proceed with Cephalosporin use.     Antimicrobials this admission: 2/29 Cefepime >> 3/1 2/29 Vancomycin >>   Dose adjustments this admission: N/A  Microbiology results: Results for orders placed or performed during the hospital encounter of 08/26/18  Blood culture (routine x 2)     Status: Abnormal   Collection Time: 08/26/18  3:25 PM  Result Value Ref Range Status  Specimen Description   Final    BLOOD LEFT ANTECUBITAL Performed at [facility], [address] [place], [place] [address]    Special Requests   Final    BOTTLES DRAWN AEROBIC AND ANAEROBIC Blood Culture adequate volume Performed at [facility], [address] [place], [place] [address]    Culture  Setup Time   Final    GRAM POSITIVE COCCI IN BOTH AEROBIC AND ANAEROBIC BOTTLES CRITICAL RESULT CALLED TO, READ BACK BY AND VERIFIED WITH: DAVID BESANTI ON 08/27/2018 AT 0620 QSD Performed at [facility], [address] [place], [place] [address]    Culture (A)  Final    STAPHYLOCOCCUS AUREUS SUSCEPTIBILITIES PERFORMED ON PREVIOUS CULTURE WITHIN THE LAST 5 DAYS. Performed at [facility], [address], [place], [place] [address]    Report Status 08/29/2018 FINAL  Final  Blood culture (routine x 2)     Status: Abnormal   Collection Time: 08/26/18  3:30 PM  Result Value Ref Range Status   Specimen Description   Final    BLOOD BLOOD RIGHT FOREARM Performed at [facility], [address] [place], [place] [address]    Special Requests   Final    BOTTLES DRAWN  AEROBIC AND ANAEROBIC Blood Culture results may not be optimal due to an inadequate volume of blood received in culture bottles Performed at [facility], [address], [place], [place] [address]    Culture  Setup Time   Final    GRAM POSITIVE COCCI IN BOTH AEROBIC AND ANAEROBIC BOTTLES CRITICAL RESULT CALLED TO, READ BACK BY AND VERIFIED WITH: DAVID BESANTI ON 08/27/2018 AT 5784 QSD Performed at [facility], [address] [place], [place] [address]    Culture METHICILLIN RESISTANT STAPHYLOCOCCUS AUREUS (A)  Final   Report Status 08/29/2018 FINAL  Final   Organism ID, Bacteria METHICILLIN RESISTANT STAPHYLOCOCCUS AUREUS  Final      Susceptibility   Methicillin resistant staphylococcus aureus - MIC*    CIPROFLOXACIN 1 SENSITIVE Sensitive     ERYTHROMYCIN RESISTANT Resistant     GENTAMICIN <=0.5 SENSITIVE Sensitive     OXACILLIN >=4 RESISTANT Resistant     TETRACYCLINE <=1 SENSITIVE Sensitive     VANCOMYCIN <=0.5 SENSITIVE Sensitive     TRIMETH/SULFA <=10 SENSITIVE Sensitive     CLINDAMYCIN RESISTANT Resistant     RIFAMPIN <=0.5 SENSITIVE Sensitive     Inducible Clindamycin POSITIVE Resistant     * METHICILLIN RESISTANT STAPHYLOCOCCUS AUREUS  Blood Culture ID Panel (Reflexed)     Status: Abnormal   Collection Time: 08/26/18  3:30 PM  Result Value Ref Range Status   Enterococcus species NOT DETECTED NOT DETECTED Final   Listeria monocytogenes NOT DETECTED NOT DETECTED Final   Staphylococcus species DETECTED (A) NOT DETECTED Final    Comment: CRITICAL RESULT CALLED TO, READ BACK BY AND VERIFIED WITH: DAVID BESANTI ON 08/27/2018 AT 0620 QSD    Staphylococcus aureus (BCID) DETECTED (A) NOT DETECTED Final    Comment: Methicillin (oxacillin)-resistant Staphylococcus aureus (MRSA). MRSA is predictably resistant to beta-lactam antibiotics (except ceftaroline). Preferred therapy is vancomycin unless clinically contraindicated. Patient requires contact precautions if   hospitalized. CRITICAL RESULT CALLED TO, READ BACK BY AND VERIFIED WITH: DAVID BESANTI ON 08/27/2018 AT 5284 QSD    Methicillin resistance DETECTED (A) NOT DETECTED Final    Comment: CRITICAL RESULT CALLED TO, READ BACK BY AND VERIFIED WITH: DAVID BESANTI ON 08/27/2018 AT 0620 QSD  Streptococcus species NOT DETECTED NOT DETECTED Final   Streptococcus agalactiae NOT DETECTED NOT DETECTED Final   Streptococcus pneumoniae NOT DETECTED NOT DETECTED Final   Streptococcus pyogenes NOT DETECTED NOT DETECTED Final   Acinetobacter baumannii NOT DETECTED NOT DETECTED Final   Enterobacteriaceae species NOT DETECTED NOT DETECTED Final   Enterobacter cloacae complex NOT DETECTED NOT DETECTED Final   Escherichia coli NOT DETECTED NOT DETECTED Final   Klebsiella oxytoca NOT DETECTED NOT DETECTED Final   Klebsiella pneumoniae NOT DETECTED NOT DETECTED Final   Proteus species NOT DETECTED NOT DETECTED Final   Serratia marcescens NOT DETECTED NOT DETECTED Final   Haemophilus influenzae NOT DETECTED NOT DETECTED Final   Neisseria meningitidis NOT DETECTED NOT DETECTED Final   Pseudomonas aeruginosa NOT DETECTED NOT DETECTED Final   Candida albicans NOT DETECTED NOT DETECTED Final   Candida glabrata NOT DETECTED NOT DETECTED Final   Candida krusei NOT DETECTED NOT DETECTED Final   Candida parapsilosis NOT DETECTED NOT DETECTED Final   Candida tropicalis NOT DETECTED NOT DETECTED Final    Comment: Performed at [facility], [address] [place], [place] [address]  Body fluid culture     Status: None   Collection Time: 08/26/18  4:19 PM  Result Value Ref Range Status   Specimen Description   Final    KNEE Performed at [facility], [address] [place], [place] [address]    Special Requests   Final    NONE Performed at [facility], [address] [place], [place] [address]    Gram Stain   Final    FEW WBC PRESENT, PREDOMINANTLY PMN ABUNDANT GRAM POSITIVE  COCCI IN PAIRS IN CLUSTERS Performed at [facility], [address] [place], [place] [address]    Culture   Final    ABUNDANT METHICILLIN RESISTANT STAPHYLOCOCCUS AUREUS   Report Status 08/30/2018 FINAL  Final   Organism ID, Bacteria METHICILLIN RESISTANT STAPHYLOCOCCUS AUREUS  Final      Susceptibility   Methicillin resistant staphylococcus aureus - MIC*    CIPROFLOXACIN 1 SENSITIVE Sensitive     ERYTHROMYCIN RESISTANT Resistant     GENTAMICIN <=0.5 SENSITIVE Sensitive     OXACILLIN >=4 RESISTANT Resistant     TETRACYCLINE <=1 SENSITIVE Sensitive     VANCOMYCIN <=0.5 SENSITIVE Sensitive     TRIMETH/SULFA <=10 SENSITIVE Sensitive     CLINDAMYCIN RESISTANT Resistant     RIFAMPIN <=0.5 SENSITIVE Sensitive     Inducible Clindamycin POSITIVE Resistant     * ABUNDANT METHICILLIN RESISTANT STAPHYLOCOCCUS AUREUS  Urine culture     Status: None   Collection Time: 08/26/18  4:52 PM  Result Value Ref Range Status   Specimen Description   Final    URINE, RANDOM Performed at [facility], [address] [place], [place] [address]    Special Requests   Final    NONE Performed at [facility], [address] [place], [place] [address]    Culture   Final    NO GROWTH Performed at [facility], [address], [place], [place] [address]    Report Status 08/28/2018 FINAL  Final  MRSA PCR Screening     Status: None   Collection Time: 08/26/18  8:31 PM  Result Value Ref Range Status   MRSA by PCR NEGATIVE NEGATIVE Final    Comment:        The GeneXpert MRSA Assay (FDA approved for NASAL specimens only), is one component  of a comprehensive MRSA colonization surveillance program. It is not intended to diagnose MRSA infection nor to guide or monitor treatment for MRSA infections. Performed at [facility], [address] [place], [place] [address]   Aerobic/Anaerobic Culture (surgical/deep wound)     Status: None (Preliminary result)    Collection Time: 08/26/18  9:51 PM  Result Value Ref Range Status   Specimen Description   Final    SYNOVIAL RIGHT KNEE Performed at [facility], [address], [place], [place] [address]    Special Requests   Final    NONE Performed at [facility], [address] [place], [place] [address]    Gram Stain   Final    NO WBC SEEN FEW GRAM POSITIVE COCCI IN PAIRS IN CLUSTERS Performed at [facility], [address], [place], [place] [address]    Culture   Final    ABUNDANT STAPHYLOCOCCUS AUREUS NO ANAEROBES ISOLATED; CULTURE IN PROGRESS FOR 5 DAYS    Report Status PENDING  Incomplete   Organism ID, Bacteria STAPHYLOCOCCUS AUREUS  Final      Susceptibility   Staphylococcus aureus - MIC*    CIPROFLOXACIN 2 INTERMEDIATE Intermediate     ERYTHROMYCIN RESISTANT Resistant     GENTAMICIN <=0.5 SENSITIVE Sensitive     OXACILLIN >=4 RESISTANT Resistant     TETRACYCLINE <=1 SENSITIVE Sensitive     VANCOMYCIN <=0.5 SENSITIVE Sensitive     TRIMETH/SULFA <=10 SENSITIVE Sensitive     CLINDAMYCIN RESISTANT Resistant     RIFAMPIN <=0.5 SENSITIVE Sensitive     Inducible Clindamycin POSITIVE Resistant     * ABUNDANT STAPHYLOCOCCUS AUREUS  CULTURE, BLOOD (ROUTINE X 2) w Reflex to ID Panel     Status: None (Preliminary result)   Collection Time: 08/29/18 12:48 AM  Result Value Ref Range Status   Specimen Description BLOOD LEFT ANTECUBITAL  Final   Special Requests   Final    BOTTLES DRAWN AEROBIC AND ANAEROBIC Blood Culture results may not be optimal due to an excessive volume of blood received in culture bottles   Culture   Final    NO GROWTH 2 DAYS Performed at [facility], [address], [place], [place] [address]    Report Status PENDING  Incomplete  CULTURE, BLOOD (ROUTINE X 2) w Reflex to ID Panel     Status: None (Preliminary result)   Collection Time: 08/29/18 12:48 AM  Result Value Ref Range Status   Specimen Description BLOOD BLOOD LEFT  HAND  Final   Special Requests   Final    BOTTLES DRAWN AEROBIC AND ANAEROBIC Blood Culture adequate volume   Culture   Final    NO GROWTH 2 DAYS Performed at [facility], [address] [place], [place] [address]    Report Status PENDING  Incomplete   Summary: MRSA Bacteremia and synovial fluid aspirate  Thank you for allowing pharmacy to be a part of this patient's care.  Albina Billet, PharmD, BCPS Clinical Pharmacist 08/31/2018 8:26 AM

## 2018-08-31 NOTE — Progress Notes (Signed)
[employer] [facility] [place] [facility]   PATIENT NAME: Larry Andrade    MR#:  [id number]  DATE OF BIRTH:  12-01-1960  SUBJECTIVE:  CHIEF COMPLAINT:   Chief Complaint  Patient presents with  . Knee Pain   Seen in the afternoon eating lunch. 6/10 R knee pain. + lower extremity swelling. + cough, unchanged.   TEE ordered by ID Dr. Rivka Safer not able to be performed today, patient unable to be sedated.  Will retry tomorrow with anesthesia per Dr. Lady Gary Cardiology.  REVIEW OF SYSTEMS:  Review of Systems  Constitutional: Negative forchills, fever, malaise/fatigue. HENT:Negative. Negative for ear discharge,ear pain,hearing loss,nosebleedsand sore throat.  Eyes:Negative. Negative for blurred visionand pain.  Respiratory: Positive forcoughand sputum production (light yellow). Negative forhemoptysis,shortness of breathand wheezing.  Cardiovascular: Positive forleg swelling. Negative forchest pain,palpitations, orthopnea,PND.  Gastrointestinal:Negative. Negative for abdominal pain,blood in stool,diarrhea,nauseaand vomiting.  Genitourinary:Negative. Negative for dysuria.  Musculoskeletal: Positive forjoint pain. Negative forback pain.  Skin:Negative.  Neurological: Negative fordizziness,tremors,speech change,focal weakness,seizuresand headaches.  Endo/Heme/Allergies:Negative.Does not bruise/bleed easily.  Psychiatric/Behavioral:Negative. Negative for depression,hallucinationsand suicidal ideas. DRUG ALLERGIES:   Allergies  Allergen Reactions  . Penicillins Rash    Has patient had a PCN reaction causing immediate rash, facial/tongue/throat swelling, SOB or lightheadedness with hypotension: No Has patient had a PCN reaction causing severe rash involving mucus membranes or skin necrosis: No Has patient had a PCN reaction that required hospitalization: No Has patient had a PCN reaction occurring within the last 10 years: No If  all of the above answers are "NO", then may proceed with Cephalosporin use.    VITALS:  Blood pressure (!) 137/107, pulse 94, temperature 98 F (36.7 C), temperature source Oral, resp. rate 17, weight 90.2 kg, SpO2 98 %. PHYSICAL EXAMINATION:  Physical Exam Constitutional:  General: He is not in acute distress. HENT:  Head: Normocephalic.  Eyes:  General: No scleral icterus. Pupils: Pupils are equal, round, and reactive to light.  Neck:  Musculoskeletal: Normal range of motionand neck supple.  Vascular: No JVD.  Trachea: No tracheal deviation.  Cardiovascular:  Rate and Rhythm: Normal rateand regular rhythm.  Heart sounds: Normal heart sounds.No murmur. Nofriction rub. Nogallop.  Pulmonary:  Effort: Pulmonary effort is normal. Norespiratory distress.  Breath sounds: Diffusely diminished breath sounds with slight expiratory wheezes present, improved from yesterday. Nowheezingor rales.  Chest:  Chest wall: No tenderness.  Abdominal:  General: Bowel sounds are normal. There isdistension, unchanged.There is a ventral hernia, easily reducible, nontender.Non-tender to palpation diffusely today. Palpations: Abdomen is soft.  Tenderness: There is noguardingor rebound.  Musculoskeletal:  General: Swellingand deformitypresent.  Right lower leg: Edemapresent.  Comments:Edematous and tender to palpation from lower thigh to foot R lower extremity.2+ pedal edemaR. Swelling, warmth, at the L wrist dorsal surface (existing gout per patient). Improved.No erythema. Tophaceous changes.  Skin: General: Skin is warm.  Findings: No erythemaor rashotherwise. Neurological:  General: No focal deficitpresent.  Mental Status: He is alertand oriented to person, place, and time. Mental status isat baseline.  Psychiatric:  Judgment: Judgmentnormal. LABORATORY PANEL:  Male CBC Recent Labs    Lab 08/31/18 0414  WBC 21.7*  HGB 8.5*  HCT 25.9*  PLT 202   ------------------------------------------------------------------------------------------------------------------ Chemistries  Recent Labs  Lab 08/27/18 0504  08/30/18 0507 08/31/18 0414  NA 136   < > 134* 132*  K 3.8   < > 4.7 4.1  CL 102   < > 102 99  CO2 23   < > 23 25  GLUCOSE  117*   < > 98 118*  BUN 27*   < > 25* 23*  CREATININE 1.32*   < > 1.08 1.07  CALCIUM 7.1*   < > 7.6* 7.8*  MG 1.5*   < > 1.8  --   AST 23  --   --   --   ALT 13  --   --   --   ALKPHOS 160*  --   --   --   BILITOT 5.0*  --   --   --    < > = values in this interval not displayed.   RADIOLOGY:  No results found. ASSESSMENT AND PLAN:   57 year 61old male with EtOH liver cirrhosis, chronic diastolic heart failure with preserved ejection fraction, ongoing tobacco abuse who was discharged from the hospital on 08/21/2018 with exacerbation of CHF and gout flare, who then presented to the emergency room with right knee swelling and pain.   Plan: 1. Sepsis from right knee septic joint, resolving:Patient presentedwith tachycardia, tachypnea and leukocytosis Gram stain showing gram-positive cocci, blood culture growing MRSA, synovial fluid cultureS Aureus. Afebrile today withWBC 21.7 Continuevancomycin, pending further recommendations with Dr. Rivka Safer after repeat blood culture ordered 08/29/2018. Will follow. POD5s/p R knee arthroscopic I&D withDr. Martha Clan OrderedCT of lower extremity and lower extremity Doppler to rule out DVT and as well as abscesses further down from the right knee due to edema, CT does not show signs of DVT and Doppler also shows no evidence of DVT. Lactic acid2.6from 2.42most recently.  DiscontinuedIV fluidsand encouragedoral intake. Seen by PT recommended SNF.Continue towork with PT as tolerated.Reassess for needs as activity tolerance increases with pain control. Continue [facility]. Consulted  ID, Dr. Rivka Safer who ordered TEE to evaluate for septic emboli. TEE not performed 08/31/2018 as patient unable to be sedated. N.p.o. after midnight, reattempt 09/01/2018 with anesthesia per Dr. Lady Gary.  2. Chronic diastolic heart failure with preserved ejection fraction:No signs of exacerbation at this time. Continue to monitor. Continue home Lasix.  3. EtOH abuse with liver cirrhosis: Patient reports his last drink was Wednesday CIWA protocoldiscontinued. No agitationx 5 days No ascites on recent abdominal ultrasound.  4.Hypomagnesemia, hypocalcemia:Repleted,recheck in a.m.Pharmacy initially consulted for electrolyte management.Signed off. appreciate input.  5.Gout:Continue allopurinol and colchicine. Monitor. Steroids ordered as well. Improving.    6. Essential hypertension:Continue metoprolol   7. Anemia Hbgstable 8.5today. Macrocytic. Chronic EtOH abuse. 1 u pRBCs given prior. Recheck AM labs. Added on folate and B12 check,within normal limits.  8.Tobacco dependence:Patient is encouraged to quit smoking. Counseling was providedand will be a discharge.  9. ZOX:WRUEAVWU tamsulosin   10. CoughPRNs and breathing treatmentsordered. Monitor clinically. Reassess for acute cardiopulmonary processes/CHF exacerbation as needed.  Given his smoking history and the duration of the cough suspect he has underlyingchroniclung disease,question COPD. Did have a CXR recently, some scarring at the lung base only.Patient will be encouraged to follow-up outpatient for PFTs with a PCP.   All the records are reviewed and case is discussed with Care Management/Social Worker. Management plans discussed with the patient and/or family and they are in agreement.  CODE STATUS: Full Code  TOTAL TIME TAKING CARE OF THIS PATIENT: 30 minutes.   More than 50% of the time was spent in counseling/coordination of care: YES  POSSIBLE D/C IN 1-2 DAYS, DEPENDING ON CLINICAL  CONDITION.   Alexiz Sustaita PA-C on 08/31/2018 at 3:50 PM  Between 7am to 6pm - Pager - [phone]  After 6 pm go to [email or web address] - password [email or web address]  [facility]  [employer] [facility]  Office  [phone]  CC: Primary care physician; Gildardo Pounds, PA  Note: This dictation was prepared with Dragon dictation along with smaller phrase technology. Any transcriptional errors that result from this process are unintentional.

## 2018-08-31 NOTE — Care Management Important Message (Signed)
Important Message  Patient Details  Name: Larry Andrade. MRN: [id number] Date of Birth: 11-05-1960   Medicare Important Message Given:  Yes    Barrie Dunker, RN 08/31/2018, 1:30 PM

## 2018-08-31 NOTE — Progress Notes (Signed)
Patient Name: Larry Andrade. Date of Encounter: 08/31/2018  Hospital Problem List     Active Problems:   Sepsis [facility])    Patient Profile     Patient with bacteremia.  Subjective   Complains of knee pain.  Inpatient Medications    . sodium chloride   Intravenous Once  . allopurinol  100 mg Oral Daily  . colchicine  0.6 mg Oral Daily  . docusate sodium  100 mg Oral BID  . enoxaparin (LOVENOX) injection  40 mg Subcutaneous Q24H  . fentaNYL      . folic acid  1 mg Oral Daily  . furosemide  20 mg Oral BID  . ipratropium-albuterol  3 mL Nebulization TID  . lidocaine      . metoprolol tartrate  25 mg Oral BID  . midazolam      . multivitamin with minerals  1 tablet Oral Daily  . pantoprazole  40 mg Oral Daily  . predniSONE  50 mg Oral Q breakfast  . sodium chloride flush  3 mL Intravenous Once  . sodium chloride flush      . tamsulosin  0.4 mg Oral Daily  . thiamine  100 mg Oral Daily   Or  . thiamine  100 mg Intravenous Daily  . cyanocobalamin  1,000 mcg Oral Daily    Vital Signs    Vitals:   08/30/18 2328 08/30/18 2330 08/31/18 0435 08/31/18 0845  BP: (!) 142/103 (!) 139/98 (!) 130/98 (!) 137/107  Pulse: 91 87 96 94  Resp: 18 18 17    Temp: 97.7 F (36.5 C) 97.7 F (36.5 C) 97.8 F (36.6 C) 98 F (36.7 C)  TempSrc: Oral Oral Oral Oral  SpO2: 99% 99% 99% 98%  Weight:        Intake/Output Summary (Last 24 hours) at 08/31/2018 1311 Last data filed at 08/31/2018 1100 Gross per 24 hour  Intake -  Output 4710 ml  Net -4710 ml   Filed Weights   08/26/18 1448  Weight: 90.2 kg    Physical Exam    GEN: Well nourished, well developed, in no acute distress.  HEENT: normal.  Neck: Supple, no JVD, carotid bruits, or masses. Cardiac: RRR, no murmurs, rubs, or gallops. No clubbing, cyanosis, edema.  Radials/DP/PT 2+ and equal bilaterally.  Respiratory:  Respirations regular and unlabored, clear to auscultation bilaterally. GI: Soft, nontender, nondistended, BS  + x 4. MS: no deformity or atrophy. Skin: warm and dry, no rash. Neuro:  Strength and sensation are intact. Psych: Normal affect.  Labs    CBC Recent Labs    08/30/18 0507 08/31/18 0414  WBC 22.3* 21.7*  HGB 8.4* 8.5*  HCT 25.6* 25.9*  MCV 99.6 98.5  PLT 201 202   Basic Metabolic Panel Recent Labs    65/78/46 0049 08/30/18 0507 08/31/18 0414  NA 134* 134* 132*  K 4.4 4.7 4.1  CL 102 102 99  CO2 23 23 25   GLUCOSE 144* 98 118*  BUN 27* 25* 23*  CREATININE 1.32* 1.08 1.07  CALCIUM 7.2* 7.6* 7.8*  MG 1.9 1.8  --    Liver Function Tests No results for input(s): AST, ALT, ALKPHOS, BILITOT, PROT, ALBUMIN in the last 72 hours. No results for input(s): LIPASE, AMYLASE in the last 72 hours. Cardiac Enzymes No results for input(s): CKTOTAL, CKMB, CKMBINDEX, TROPONINI in the last 72 hours. BNP No results for input(s): BNP in the last 72 hours. D-Dimer No results for input(s): DDIMER in the last  72 hours. Hemoglobin A1C No results for input(s): HGBA1C in the last 72 hours. Fasting Lipid Panel No results for input(s): CHOL, HDL, LDLCALC, TRIG, CHOLHDL, LDLDIRECT in the last 72 hours. Thyroid Function Tests No results for input(s): TSH, T4TOTAL, T3FREE, THYROIDAB in the last 72 hours.  Invalid input(s): FREET3  Telemetry    Sinus rhythm  ECG    Sinus rhythm with no ischemia  Radiology    Dg Chest 2 View  Result Date: 08/16/2018 CLINICAL DATA:  Shortness of breath and congestion for 2 weeks. History of CHF. EXAM: CHEST - 2 VIEW COMPARISON:  None. FINDINGS: Cardiac silhouette is mildly enlarged. Calcified coronary artery versus stent. Calcified aortic arch. LEFT lung base scarring. No pleural effusion or focal consolidation. No pneumothorax. Old RIGHT rib fractures. Osteopenia. IMPRESSION: Mild cardiomegaly.  No acute pulmonary process. Electronically Signed   By: Awilda Metro M.D.   On: 08/16/2018 17:06   Dg Knee 1-2 Views Right  Result Date:  08/26/2018 CLINICAL DATA:  Knee pain.  No known injury.  Initial encounter. EXAM: RIGHT KNEE - 1-2 VIEW COMPARISON:  None. FINDINGS: A moderate to large knee effusion is noted. Tricompartmental degenerative changes are present. No acute fracture or dislocation. IMPRESSION: Moderate to large knee effusion without acute bony abnormality. Tricompartment degenerative changes. Electronically Signed   By: Harmon Pier M.D.   On: 08/26/2018 18:50   Ct Tibia Fibula Right W Contrast  Result Date: 08/26/2018 CLINICAL DATA:  Right lower extremity pain and swelling. EXAM: CT OF THE LOWER RIGHT EXTREMITY WITH CONTRAST TECHNIQUE: Multidetector CT imaging of the lower right extremity was performed according to the standard protocol following intravenous contrast administration. COMPARISON:  Radiographs 08/26/2018 CONTRAST:  OMNIPAQUE IOHEXOL 300 MG/ML  SOLN FINDINGS: There is a very large and complex right knee joint effusion with enhancing synovium and synovial calcifications. There is also some air in the joint but this may be related to recent prior joint aspiration. There is also an erosive process involving the tibiofibular joint with a large erosion involving the fibular head and nearby adjacent tibia. 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 Similar findings at the ankle joint with a large complex joint effusion, synovial enhancement and nodular calcified tissue. There also scattered erosions involving the tibia, fibula and talus. Extensive erosive changes are also noted involving the cuneiforms. The anterior ankle tendons demonstrate thickening, nodularity and calcification which is also likely gout related. There is diffuse and fairly marked subcutaneous soft tissue swelling/edema/fluid involving the entire right lower extremity which would suggest cellulitis. No discrete rim enhancing drainable fluid collection to suggest an abscess. No findings for myofasciitis or  pyomyositis. IMPRESSION: 1. Large complex joint effusions involving the knee joint and ankle joint as detailed above. I think the findings are most likely due to gout. Extensive tophaceous changes. Recommend correlation with recent joint aspirations. Could not totally exclude the possibility of superimposed septic arthritis. 2. Erosive changes and soft tissue tophi surrounding the tibiofibular joint proximally. 3. Advanced erosive changes involving the proximal and midfoot bony structures. 4. Tophaceous changes involving the anterior ankle tendons. 5. Diffuse subcutaneous soft tissue swelling/edema/fluid suggesting cellulitis. No definite findings for myofasciitis or pyomyositis. Electronically Signed   By: Rudie Meyer M.D.   On: 08/26/2018 19:23   US Venous Img Lower Unilateral Right  Result Date: 08/27/2018 CLINICAL DATA:  Right lower extremity swelling and pain. EXAM: RIGHT LOWER EXTREMITY VENOUS DOPPLER ULTRASOUND TECHNIQUE: Gray-scale sonography with graded compression, as well  as color Doppler and duplex ultrasound were performed to evaluate the lower extremity deep venous systems from the level of the common femoral vein and including the common femoral, femoral, profunda femoral, popliteal and calf veins including the posterior tibial, peroneal and gastrocnemius veins when visible. The superficial great saphenous vein was also interrogated. Spectral Doppler was utilized to evaluate flow at rest and with distal augmentation maneuvers in the common femoral, femoral and popliteal veins. COMPARISON:  None. FINDINGS: Contralateral Common Femoral Vein: Respiratory phasicity is normal and symmetric with the symptomatic side. No evidence of thrombus. Normal compressibility. Common Femoral Vein: No evidence of thrombus. Normal compressibility, respiratory phasicity and response to augmentation. Saphenofemoral Junction: No evidence of thrombus. Normal compressibility and flow on color Doppler imaging. Profunda  Femoral Vein: No evidence of thrombus. Normal compressibility and flow on color Doppler imaging. Femoral Vein: No evidence of thrombus. Normal compressibility, respiratory phasicity and response to augmentation. Popliteal Vein: No evidence of thrombus. Normal compressibility, respiratory phasicity and response to augmentation. Calf Veins: No evidence of thrombus. Normal compressibility and flow on color Doppler imaging. Superficial Great Saphenous Vein: No evidence of thrombus. Normal compressibility. Venous Reflux:  None. Other Findings:  None. IMPRESSION: No evidence of deep venous thrombosis. Electronically Signed   By: Myles Rosenthal M.D.   On: 08/27/2018 09:36   Dg Chest Port 1 View  Result Date: 08/26/2018 CLINICAL DATA:  Sepsis EXAM: PORTABLE CHEST 1 VIEW COMPARISON:  08/16/2018 chest radiograph FINDINGS: The cardiomediastinal silhouette is unremarkable. There is no evidence of focal airspace disease, pulmonary edema, suspicious pulmonary nodule/mass, pleural effusion, or pneumothorax. No acute bony abnormalities are identified. Remote RIGHT rib fractures. IMPRESSION: No active disease. Electronically Signed   By: Harmon Pier M.D.   On: 08/26/2018 16:41   [place] Ascites (abdomen Limited)  Result Date: 08/18/2018 CLINICAL DATA:  Cirrhosis. EXAM: LIMITED ABDOMEN ULTRASOUND FOR ASCITES TECHNIQUE: Limited ultrasound survey for ascites was performed in all four abdominal quadrants. COMPARISON:  None. FINDINGS: Survey of the abdominal cavity demonstrates no visible ascites. IMPRESSION: No ascites identified in the peritoneal cavity by ultrasound. Electronically Signed   By: Irish Lack M.D.   On: 08/18/2018 16:40    Assessment & Plan    Patient presented for transesophageal echo at the request of infectious disease.  After 5 mg of IV Versed and 100 mg of fentanyl, patient had no evidence of sedation.  Patient very combative and noncooperative.  Aborted the procedure today.  Will need to reattempt tomorrow  with anesthesia assistance with sedation.  Will attempt repeat procedure tomorrow with sedation per department of anesthesia.  Signed, Darlin Priestly Orlin Kann MD 08/31/2018, 1:11 PM  Pager: [phone]

## 2018-08-31 NOTE — Progress Notes (Signed)
Plan is for patient to D/C to [facility] pending medical clearance. Rick admissions coordinator at [facility] is aware of above.   [employer], LCSW [phone]

## 2018-09-01 ENCOUNTER — Encounter
Admission: EM | Disposition: A | Discharge status: Skilled Nursing Facility | Payer: Self-pay | Source: Home / Self Care | Attending: Internal Medicine

## 2018-09-01 LAB — CBC
HCT: 25.7 % — ABNORMAL LOW (ref 39.0–52.0)
Hemoglobin: 8.3 g/dL — ABNORMAL LOW (ref 13.0–17.0)
MCH: 32.5 pg (ref 26.0–34.0)
MCHC: 32.3 g/dL (ref 30.0–36.0)
MCV: 100.8 fL — ABNORMAL HIGH (ref 80.0–100.0)
Platelets: 192 10*3/uL (ref 150–400)
RBC: 2.55 MIL/uL — ABNORMAL LOW (ref 4.22–5.81)
RDW: 20.5 % — ABNORMAL HIGH (ref 11.5–15.5)
WBC: 22.8 10*3/uL — ABNORMAL HIGH (ref 4.0–10.5)
nRBC: 0 % (ref 0.0–0.2)

## 2018-09-01 LAB — AEROBIC/ANAEROBIC CULTURE W GRAM STAIN (SURGICAL/DEEP WOUND): Gram Stain: NONE SEEN

## 2018-09-01 LAB — HEPATIC FUNCTION PANEL
ALT: 29 U/L (ref 0–44)
AST: 41 U/L (ref 15–41)
Albumin: 1.9 g/dL — ABNORMAL LOW (ref 3.5–5.0)
Alkaline Phosphatase: 243 U/L — ABNORMAL HIGH (ref 38–126)
Bilirubin, Direct: 0.9 mg/dL — ABNORMAL HIGH (ref 0.0–0.2)
Indirect Bilirubin: 1.1 mg/dL — ABNORMAL HIGH (ref 0.3–0.9)
Total Bilirubin: 2 mg/dL — ABNORMAL HIGH (ref 0.3–1.2)
Total Protein: 5.9 g/dL — ABNORMAL LOW (ref 6.5–8.1)

## 2018-09-01 LAB — BASIC METABOLIC PANEL
ANION GAP: 8 (ref 5–15)
BUN: 21 mg/dL — ABNORMAL HIGH (ref 6–20)
CO2: 28 mmol/L (ref 22–32)
Calcium: 8 mg/dL — ABNORMAL LOW (ref 8.9–10.3)
Chloride: 98 mmol/L (ref 98–111)
Creatinine, Ser: 0.99 mg/dL (ref 0.61–1.24)
GFR calc Af Amer: >60 mL/min (ref >60)
GFR calc non Af Amer: >60 mL/min (ref >60)
Glucose, Bld: 122 mg/dL — ABNORMAL HIGH (ref 70–99)
Potassium: 4.3 mmol/L (ref 3.5–5.1)
Sodium: 134 mmol/L — ABNORMAL LOW (ref 135–145)

## 2018-09-01 LAB — VANCOMYCIN, RANDOM: Vancomycin Rm: 11

## 2018-09-01 SURGERY — ECHOCARDIOGRAM, TRANSESOPHAGEAL
Anesthesia: Moderate Sedation

## 2018-09-01 SURGERY — ECHOCARDIOGRAM, TRANSESOPHAGEAL
Anesthesia: Choice

## 2018-09-01 MED ORDER — VANCOMYCIN HCL 10 G IV SOLR
1250.0000 mg | INTRAVENOUS | Status: DC
  Administered 2018-09-01 – 2018-09-07 (×7): 1250 mg via INTRAVENOUS
  Filled 2018-09-01 (×10): qty 1250

## 2018-09-01 MED ORDER — VANCOMYCIN VARIABLE DOSE PER UNSTABLE RENAL FUNCTION (PHARMACIST DOSING): Status: DC

## 2018-09-01 MED ORDER — LIDOCAINE HCL (PF) 1 % IJ SOLN
30.0000 mL | Freq: Once | INTRAMUSCULAR | Status: DC
  Filled 2018-09-01: qty 30

## 2018-09-01 MED ORDER — VANCOMYCIN HCL 10 G IV SOLR
1250.0000 mg | Freq: Once | INTRAVENOUS | Status: DC
  Filled 2018-09-01: qty 1250

## 2018-09-01 MED ORDER — LIDOCAINE HCL (PF) 1 % IJ SOLN
60.0000 mL | Freq: Once | INTRAMUSCULAR | Status: DC
  Filled 2018-09-01: qty 60

## 2018-09-01 NOTE — Consult Note (Addendum)
Pharmacy Antibiotic Note  Larry Andrade. is a 58 y.o. male admitted on 08/26/2018 with sepsis.  Patient has knee wound with pus, septic arthritis with MRSA in synovial fluid and blood cultures (2/29).  Pharmacy has been consulted for Vancomycin dosing.  Plan: Patient originally on Vancomycin maintenance dosing of 1000 mg IV q12h.  Estimated Kinetics Using IBW of 80 kg, Scr 1.34, and Vd 0.7 Goal AUC 400-550 Anticipated AUC 490.2  However, based on assessing several levels, pt doesn't clear vancomycin as expected from renal function - Vancomycin was held for 2 doses while waiting for trough to reach acceptable level for redosing  3/3 1707 last dose 1000mg  (at 1g q12) 3/3 2046 Vanc peak 38 3/4 0507 Vanc trough 31 (redrew in case error) 3/4 0941 Vanc trough 28 (didn't appear to be error) 3/5 0414 Vanc random 15  Running Vancomycin 2 Level PK Calculator  Ke = 0.0295, t 1/2 = 23.5 hr  Will check vanc random 3/6 @ 2000 to assure clearing, consider dosing at 1250mg  q 24h if vancomycin clearing appropriately   Weight: 198 lb 13.7 oz (90.2 kg)  Temp (24hrs), Avg:98.2 F (36.8 C), Min:97.9 F (36.6 C), Max:98.4 F (36.9 C)  Recent Labs  Lab 08/26/18 1808  08/27/18 0759 08/27/18 1404  08/28/18 0310 08/28/18 1351 08/29/18 0047 08/29/18 0049 08/29/18 2046 08/30/18 0507 08/30/18 0941 08/31/18 0414 09/01/18 0510  WBC  --    < >  --   --    < > 22.4*  --   --  23.2*  --  22.3*  --  21.7* 22.8*  CREATININE  --    < >  --   --   --  1.19  --   --  1.32*  --  1.08  --  1.07 0.99  LATICACIDVEN 2.6*  --  2.3* 2.5*  --   --  2.5* 2.0*  --   --   --   --   --   --   VANCOTROUGH  --   --   --   --   --   --   --   --   --   --  31* 28*  --   --   VANCOPEAK  --   --   --   --   --   --   --   --   --  66  --   --   --   --   VANCORANDOM  --   --   --   --   --   --   --   --   --   --   --   --  15  --    < > = values in this interval not displayed.    Estimated Creatinine Clearance:  95.3 mL/min (by C-G formula based on SCr of 0.99 mg/dL).    Allergies  Allergen Reactions  . Penicillins Rash    Has patient had a PCN reaction causing immediate rash, facial/tongue/throat swelling, SOB or lightheadedness with hypotension: No Has patient had a PCN reaction causing severe rash involving mucus membranes or skin necrosis: No Has patient had a PCN reaction that required hospitalization: No Has patient had a PCN reaction occurring within the last 10 years: No If all of the above answers are "NO", then may proceed with Cephalosporin use.     Antimicrobials this admission: 2/29 Cefepime >> 3/1 2/29 Vancomycin >>   Dose adjustments this admission:  N/A  Microbiology results: Results for orders placed or performed during the hospital encounter of 08/26/18  Blood culture (routine x 2)     Status: Abnormal   Collection Time: 08/26/18  3:25 PM  Result Value Ref Range Status   Specimen Description   Final    BLOOD LEFT ANTECUBITAL Performed at [facility], [address], [place], [place] [address]    Special Requests   Final    BOTTLES DRAWN AEROBIC AND ANAEROBIC Blood Culture adequate volume Performed at [facility], [address], [place], [place] [address]    Culture  Setup Time   Final    GRAM POSITIVE COCCI IN BOTH AEROBIC AND ANAEROBIC BOTTLES CRITICAL RESULT CALLED TO, READ BACK BY AND VERIFIED WITH: DAVID BESANTI ON 08/27/2018 AT 0620 QSD Performed at [facility], [address] [place], [place] [address]    Culture (A)  Final    STAPHYLOCOCCUS AUREUS SUSCEPTIBILITIES PERFORMED ON PREVIOUS CULTURE WITHIN THE LAST 5 DAYS. Performed at [facility], [address] [place], [place] [address]    Report Status 08/29/2018 FINAL  Final  Blood culture (routine x 2)     Status: Abnormal   Collection Time: 08/26/18  3:30 PM  Result Value Ref Range Status   Specimen Description   Final    BLOOD BLOOD RIGHT FOREARM Performed at  [facility], [address] [place], [place] [address]    Special Requests   Final    BOTTLES DRAWN AEROBIC AND ANAEROBIC Blood Culture results may not be optimal due to an inadequate volume of blood received in culture bottles Performed at [facility], [address] [place], [place] [address]    Culture  Setup Time   Final    GRAM POSITIVE COCCI IN BOTH AEROBIC AND ANAEROBIC BOTTLES CRITICAL RESULT CALLED TO, READ BACK BY AND VERIFIED WITH: DAVID BESANTI ON 08/27/2018 AT 5784 QSD Performed at [facility], [address], [place], [place] [address]    Culture METHICILLIN RESISTANT STAPHYLOCOCCUS AUREUS (A)  Final   Report Status 08/29/2018 FINAL  Final   Organism ID, Bacteria METHICILLIN RESISTANT STAPHYLOCOCCUS AUREUS  Final      Susceptibility   Methicillin resistant staphylococcus aureus - MIC*    CIPROFLOXACIN 1 SENSITIVE Sensitive     ERYTHROMYCIN RESISTANT Resistant     GENTAMICIN <=0.5 SENSITIVE Sensitive     OXACILLIN >=4 RESISTANT Resistant     TETRACYCLINE <=1 SENSITIVE Sensitive     VANCOMYCIN <=0.5 SENSITIVE Sensitive     TRIMETH/SULFA <=10 SENSITIVE Sensitive     CLINDAMYCIN RESISTANT Resistant     RIFAMPIN <=0.5 SENSITIVE Sensitive     Inducible Clindamycin POSITIVE Resistant     * METHICILLIN RESISTANT STAPHYLOCOCCUS AUREUS  Blood Culture ID Panel (Reflexed)     Status: Abnormal   Collection Time: 08/26/18  3:30 PM  Result Value Ref Range Status   Enterococcus species NOT DETECTED NOT DETECTED Final   Listeria monocytogenes NOT DETECTED NOT DETECTED Final   Staphylococcus species DETECTED (A) NOT DETECTED Final    Comment: CRITICAL RESULT CALLED TO, READ BACK BY AND VERIFIED WITH: DAVID BESANTI ON 08/27/2018 AT 0620 QSD    Staphylococcus aureus (BCID) DETECTED (A) NOT DETECTED Final    Comment: Methicillin (oxacillin)-resistant Staphylococcus aureus (MRSA). MRSA is predictably resistant to beta-lactam antibiotics (except ceftaroline).  Preferred therapy is vancomycin unless clinically contraindicated. Patient requires contact precautions if  hospitalized. CRITICAL RESULT CALLED TO, READ BACK BY  AND VERIFIED WITH: DAVID BESANTI ON 08/27/2018 AT 0620 QSD    Methicillin resistance DETECTED (A) NOT DETECTED Final    Comment: CRITICAL RESULT CALLED TO, READ BACK BY AND VERIFIED WITH: DAVID BESANTI ON 08/27/2018 AT 0620 QSD    Streptococcus species NOT DETECTED NOT DETECTED Final   Streptococcus agalactiae NOT DETECTED NOT DETECTED Final   Streptococcus pneumoniae NOT DETECTED NOT DETECTED Final   Streptococcus pyogenes NOT DETECTED NOT DETECTED Final   Acinetobacter baumannii NOT DETECTED NOT DETECTED Final   Enterobacteriaceae species NOT DETECTED NOT DETECTED Final   Enterobacter cloacae complex NOT DETECTED NOT DETECTED Final   Escherichia coli NOT DETECTED NOT DETECTED Final   Klebsiella oxytoca NOT DETECTED NOT DETECTED Final   Klebsiella pneumoniae NOT DETECTED NOT DETECTED Final   Proteus species NOT DETECTED NOT DETECTED Final   Serratia marcescens NOT DETECTED NOT DETECTED Final   Haemophilus influenzae NOT DETECTED NOT DETECTED Final   Neisseria meningitidis NOT DETECTED NOT DETECTED Final   Pseudomonas aeruginosa NOT DETECTED NOT DETECTED Final   Candida albicans NOT DETECTED NOT DETECTED Final   Candida glabrata NOT DETECTED NOT DETECTED Final   Candida krusei NOT DETECTED NOT DETECTED Final   Candida parapsilosis NOT DETECTED NOT DETECTED Final   Candida tropicalis NOT DETECTED NOT DETECTED Final    Comment: Performed at [facility], [address] [place], [place] [address]  Body fluid culture     Status: None   Collection Time: 08/26/18  4:19 PM  Result Value Ref Range Status   Specimen Description   Final    KNEE Performed at [facility], [address] [place], [place] [address]    Special Requests   Final    NONE Performed at [facility], [address]  [place], [place] [address]    Gram Stain   Final    FEW WBC PRESENT, PREDOMINANTLY PMN ABUNDANT GRAM POSITIVE COCCI IN PAIRS IN CLUSTERS Performed at [facility], [address] [place], [place] [address]    Culture   Final    ABUNDANT METHICILLIN RESISTANT STAPHYLOCOCCUS AUREUS   Report Status 08/30/2018 FINAL  Final   Organism ID, Bacteria METHICILLIN RESISTANT STAPHYLOCOCCUS AUREUS  Final      Susceptibility   Methicillin resistant staphylococcus aureus - MIC*    CIPROFLOXACIN 1 SENSITIVE Sensitive     ERYTHROMYCIN RESISTANT Resistant     GENTAMICIN <=0.5 SENSITIVE Sensitive     OXACILLIN >=4 RESISTANT Resistant     TETRACYCLINE <=1 SENSITIVE Sensitive     VANCOMYCIN <=0.5 SENSITIVE Sensitive     TRIMETH/SULFA <=10 SENSITIVE Sensitive     CLINDAMYCIN RESISTANT Resistant     RIFAMPIN <=0.5 SENSITIVE Sensitive     Inducible Clindamycin POSITIVE Resistant     * ABUNDANT METHICILLIN RESISTANT STAPHYLOCOCCUS AUREUS  Urine culture     Status: None   Collection Time: 08/26/18  4:52 PM  Result Value Ref Range Status   Specimen Description   Final    URINE, RANDOM Performed at [facility], [address] [place], [place] [address]    Special Requests   Final    NONE Performed at [facility], [address] [place], [place] [address]    Culture   Final    NO GROWTH Performed at [facility], [address] [place], [place] [address]    Report Status 08/28/2018 FINAL  Final  MRSA PCR Screening     Status: None  Collection Time: 08/26/18  8:31 PM  Result Value Ref Range Status   MRSA by PCR NEGATIVE NEGATIVE Final    Comment:        The GeneXpert MRSA Assay (FDA approved for NASAL specimens only), is one component of a comprehensive MRSA colonization surveillance program. It is not intended to diagnose MRSA infection nor to guide or monitor treatment for MRSA infections. Performed at [facility], [address]  [place], [place] [address]   Aerobic/Anaerobic Culture (surgical/deep wound)     Status: None (Preliminary result)   Collection Time: 08/26/18  9:51 PM  Result Value Ref Range Status   Specimen Description   Final    SYNOVIAL RIGHT KNEE Performed at [facility], [address], [place], [place] [address]    Special Requests   Final    NONE Performed at [facility], [address] [place], [place] [address]    Gram Stain   Final    NO WBC SEEN FEW GRAM POSITIVE COCCI IN PAIRS IN CLUSTERS Performed at [facility], [address], [place], [place] [address]    Culture   Final    ABUNDANT METHICILLIN RESISTANT STAPHYLOCOCCUS AUREUS NO ANAEROBES ISOLATED; CULTURE IN PROGRESS FOR 5 DAYS    Report Status PENDING  Incomplete   Organism ID, Bacteria METHICILLIN RESISTANT STAPHYLOCOCCUS AUREUS  Final      Susceptibility   Methicillin resistant staphylococcus aureus - MIC*    CIPROFLOXACIN 2 INTERMEDIATE Intermediate     ERYTHROMYCIN RESISTANT Resistant     GENTAMICIN <=0.5 SENSITIVE Sensitive     OXACILLIN >=4 RESISTANT Resistant     TETRACYCLINE <=1 SENSITIVE Sensitive     VANCOMYCIN <=0.5 SENSITIVE Sensitive     TRIMETH/SULFA <=10 SENSITIVE Sensitive     CLINDAMYCIN RESISTANT Resistant     RIFAMPIN <=0.5 SENSITIVE Sensitive     Inducible Clindamycin POSITIVE Resistant     * ABUNDANT METHICILLIN RESISTANT STAPHYLOCOCCUS AUREUS  CULTURE, BLOOD (ROUTINE X 2) w Reflex to ID Panel     Status: None (Preliminary result)   Collection Time: 08/29/18 12:48 AM  Result Value Ref Range Status   Specimen Description BLOOD LEFT ANTECUBITAL  Final   Special Requests   Final    BOTTLES DRAWN AEROBIC AND ANAEROBIC Blood Culture results may not be optimal due to an excessive volume of blood received in culture bottles   Culture   Final    NO GROWTH 3 DAYS Performed at [facility], [address], [place], [place] [address]    Report Status PENDING  Incomplete   CULTURE, BLOOD (ROUTINE X 2) w Reflex to ID Panel     Status: None (Preliminary result)   Collection Time: 08/29/18 12:48 AM  Result Value Ref Range Status   Specimen Description BLOOD BLOOD LEFT HAND  Final   Special Requests   Final    BOTTLES DRAWN AEROBIC AND ANAEROBIC Blood Culture adequate volume   Culture   Final    NO GROWTH 3 DAYS Performed at [facility], [address] [place], [place] [address]    Report Status PENDING  Incomplete   Summary: MRSA Bacteremia and synovial fluid aspirate  Thank you for allowing pharmacy to be a part of this patient's care.  Albina Billet, PharmD, BCPS Clinical Pharmacist 09/01/2018 8:22 AM

## 2018-09-01 NOTE — Progress Notes (Signed)
Order rec'd from Dr. Sherryll Burger to start regular diet since TEE has been cancelled.

## 2018-09-01 NOTE — Progress Notes (Signed)
Subjective: 6 Days Post-Op Procedure(s) (LRB): ARTHROSCOPY KNEE (Right)   Patient right knee remains swollen.  It is not red or hot.  Pain is moderate.  His white count remains elevated at 22,000.  He has not had any fever or chills however.  Cultures from surgery did grow MRSA.  He remains on steroids however.  Patient reports pain as moderate.  Objective:   VITALS:   Vitals:   09/01/18 0205 09/01/18 0745  BP:  (!) 147/88  Pulse: 90 94  Resp:  18  Temp:  97.9 F (36.6 C)  SpO2:  98%    Neurologically intact ABD soft Neurovascular intact Sensation intact distally Intact pulses distally Dorsiflexion/Plantar flexion intact Incision: Wounds are benign.  There is no redness and minimal warmth around the knee.  There is an effusion however.  LABS Recent Labs    08/30/18 0507 08/31/18 0414 09/01/18 0510  HGB 8.4* 8.5* 8.3*  HCT 25.6* 25.9* 25.7*  WBC 22.3* 21.7* 22.8*  PLT 201 202 192    Recent Labs    08/30/18 0507 08/31/18 0414 09/01/18 0510  NA 134* 132* 134*  K 4.7 4.1 4.3  BUN 25* 23* 21*  CREATININE 1.08 1.07 0.99  GLUCOSE 98 118* 122*    No results for input(s): LABPT, INR in the last 72 hours.   Assessment/Plan: 6 Days Post-Op Procedure(s) (LRB): ARTHROSCOPY KNEE (Right)   Advance diet Up with therapy   I aspirated the knee and removed about 120 cc of thick creamy fluid.  Sample was sent for culture again. He remains on high-dose steroids which may account for the elevated white count in addition to the infection.  Would continue IV antibiotics. We will reevaluate him for response to the aspiration. Consider re-washing the knee on Sunday if necessary. PT may ambulate the patient

## 2018-09-01 NOTE — Progress Notes (Signed)
[employer] - [place] at [facility]   PATIENT NAME: Larry KannerJohn Andrade    MR#:  [id number]  DATE OF BIRTH:  06/16/1961  SUBJECTIVE:  CHIEF COMPLAINT:   Chief Complaint  Patient presents with  . Knee Pain   In bed. Polar care off when seen. I re-applied. 7/10 pain in knee. + swelling R extremity. TEE canceled by Dr. Lady GaryFath today as it will not change clinical management. Eager to get up to chair, encouraged to ask RN.  Still plan for d/c to [facility] once ready.   REVIEW OF SYSTEMS:  Review of Systems  Constitutional: Negative forchills, fever, malaise/fatigue. HENT:Negative. Negative for ear discharge,ear pain,hearing loss,nosebleedsand sore throat. Eyes:Negative. Negative for blurred visionand pain.  Respiratory: Positive forcoughand sputum production (light yellow). Negative forhemoptysis,shortness of breathand wheezing.  Cardiovascular: Positive forleg swelling. Negative forchest pain,palpitations, orthopnea,PND.  Gastrointestinal:Negative. Negative for abdominal pain,blood in stool,diarrhea,nauseaand vomiting.  Genitourinary:Negative. Negative for dysuria.  Musculoskeletal: Positive forjoint pain. Negative forback pain.  Skin:Negative.  Neurological: Negative fordizziness,tremors,speech change,focal weakness,seizuresand headaches.  Endo/Heme/Allergies:Negative.Does not bruise/bleed easily.  Psychiatric/Behavioral:Negative. Negative for depression,hallucinationsand suicidal ideas. DRUG ALLERGIES:   Allergies  Allergen Reactions  . Penicillins Rash    Has patient had a PCN reaction causing immediate rash, facial/tongue/throat swelling, SOB or lightheadedness with hypotension: No Has patient had a PCN reaction causing severe rash involving mucus membranes or skin necrosis: No Has patient had a PCN reaction that required hospitalization: No Has patient had a PCN reaction occurring within the last 10 years:  No If all of the above answers are "NO", then may proceed with Cephalosporin use.    VITALS:  Blood pressure (!) 147/88, pulse 94, temperature 97.9 F (36.6 C), temperature source Oral, resp. rate 18, weight 90.2 kg, SpO2 98 %. PHYSICAL EXAMINATION:  Physical Exam Constitutional:  General: He is not in acute distress. HENT:  Head: Normocephalic.  Eyes:  General: No scleral icterus. Pupils: Pupils are equal, round, and reactive to light.  Neck:  Musculoskeletal: Normal range of motionand neck supple.  Vascular: No JVD.  Trachea: No tracheal deviation.  Cardiovascular:  Rate and Rhythm: Normal rateand regular rhythm.  Heart sounds: Normal heart sounds.No murmur. Nofriction rub. Nogallop.  Pulmonary:  Effort: Pulmonary effort is normal. Norespiratory distress.  Breath sounds: Diffusely diminished breath sounds withslightexpiratory wheezes present, improved from yesterday. Nowheezingor rales.  Chest:  Chest wall: No tenderness.  Abdominal:  General: Bowel sounds are normal. There isdistension, unchanged.There is a ventral hernia, easily reducible, nontender.Non-tender to palpation diffusely today. Palpations: Abdomen is soft.  Tenderness: There is noguardingor rebound.  Musculoskeletal:  General: Swellingand deformitypresent.  Right lower leg: Edemapresent.  Comments:Edematous and tender to palpation from lower thigh to foot R lower extremity.2+ pedal edemaR.  Tophaceous changes L and R hands dorsal surface. Swelling, erythema, and pain improved.  Skin: General: Skin is warm.  Findings: No erythemaor rashotherwise. Neurological:  General: No focal deficitpresent.  Mental Status: He is alertand oriented to person, place, and time. Mental status isat baseline.  Psychiatric:  Judgment: Judgmentnormal. LABORATORY PANEL:  Male CBC Recent Labs  Lab 09/01/18 0510   WBC 22.8*  HGB 8.3*  HCT 25.7*  PLT 192   ------------------------------------------------------------------------------------------------------------------ Chemistries  Recent Labs  Lab 08/27/18 0504  08/30/18 0507  09/01/18 0510  NA 136   < > 134*   < > 134*  K 3.8   < > 4.7   < > 4.3  CL 102   < > 102   < >  98  CO2 23   < > 23   < > 28  GLUCOSE 117*   < > 98   < > 122*  BUN 27*   < > 25*   < > 21*  CREATININE 1.32*   < > 1.08   < > 0.99  CALCIUM 7.1*   < > 7.6*   < > 8.0*  MG 1.5*   < > 1.8  --   --   AST 23  --   --   --   --   ALT 13  --   --   --   --   ALKPHOS 160*  --   --   --   --   BILITOT 5.0*  --   --   --   --    < > = values in this interval not displayed.   RADIOLOGY:  No results found. ASSESSMENT AND PLAN:   58 year old male with EtOH liver cirrhosis, chronic diastolic heart failure with preserved ejection fraction, ongoing tobacco abuse who was discharged from the hospital on2/24/2020with exacerbation of CHF and gout flare,who thenpresentedto the emergency room with right knee swelling and pain.   Plan: 1. Sepsis from right knee septic joint, resolving:Patient presentedwith tachycardia, tachypnea and leukocytosis Gram stain showing gram-positive cocci, blood culture growing MRSA, synovial fluid cultureS Aureus. Afebrile today withWBC 22.8 Continuevancomycin 4-6 weeks IV per ID/Dr. Rivka Safer.  POD6s/p R knee arthroscopic I&D withDr. Martha Clan OrderedCT of lower extremity and lower extremity Doppler to rule out DVT and as well as abscesses further down from the right knee due to edema, CT does not show signs of DVT and Doppler also shows no evidence of DVT. Lactic acid2.85from 2.10most recently. DiscontinuedIV fluidsand encouragedoral intake. Seen by PT recommended SNF.Continue towork with PT as tolerated.Reassess for needs as activity tolerance increases with pain control. Continue Polar Care. Consulted ID, Dr. Rivka Safer who  ordered TEE to evaluate for septic emboli. TEE not performed 08/31/2018 as patient unable to be sedated. N.p.o. after midnight, reattempt 09/01/2018 with anesthesia per Dr. Lady Gary. 09/01/2018 TEE not performed. See note Dr. Lady Gary. Will require 4-6 weeks IV abx regardless. Ortho consulted again Dr. Hyacinth Meeker - per ID, patient should be re-evaluated for septic joint prior to d/c to facility.   2. Chronic diastolic heart failure with preserved ejection fraction:No signs of exacerbation at this time. Continue to monitor. ContinuehomeLasix.  3. EtOH abuse with liver cirrhosis: Patient reports his last drink was Wednesday CIWA protocoldiscontinued. No agitationx 6  days No ascites on recent abdominal ultrasound.  4.Hypomagnesemia, hypocalcemia:Repleted,recheck in a.m.Pharmacy initiallyconsulted for electrolyte management.Signed off.appreciate input.  5.Gout:Continue allopurinol and colchicine. Monitor. Steroids ordered as well.Improving.    6. Essential hypertension:Continue metoprolol   7. Anemia Hbgstable 8.3today. Macrocytic. Chronic EtOH abuse. 1 u pRBCsgiven prior. Recheck AM labs. Added on folate and B12 check,within normal limits.  8.Tobacco dependence:Patient is encouraged to quit smoking. Counseling was providedand will be a discharge.  9. PZW:CHENIDPO tamsulosin   10. CoughPRNs and breathing treatmentsordered. Monitor clinically. Reassess for acute cardiopulmonary processes/CHF exacerbation as needed.  Given his smoking history (40 pack-year history) and the duration of the cough (months) suspect he has underlyingchroniclung disease,question COPD. Did have a CXR recently, some scarring at the lung base only.Patient should be encouraged tofollow-up outpatient for PFTs withaPCP.   All the records are reviewed and case is discussed with Care Management/Social Worker. Management plans discussed with the patient and/or family and they are in  agreement.  CODE STATUS:  Full Code  TOTAL TIME TAKING CARE OF THIS PATIENT: 30 minutes.   More than 50% of the time was spent in counseling/coordination of care: YES  POSSIBLE D/C IN 1 DAYS, DEPENDING ON CLINICAL CONDITION.   Wylee Dorantes PA-C on 09/01/2018 at 1:57 PM  Between 7am to 6pm - Pager - [phone]  After 6 pm go to [email or web address] - Scientist, research (life sciences) [facility]  Office  [phone]  CC: Primary care physician; Gildardo Pounds, PA  Note: This dictation was prepared with Dragon dictation along with smaller phrase technology. Any transcriptional errors that result from this process are unintentional.

## 2018-09-01 NOTE — Progress Notes (Addendum)
   Date of Admission:  08/26/2018     ID: Larry Yax. is a 58 y.o. male  Active Problems:   Sepsis (HCC) MRSA bacteremia MRSA septic arthritis rt knee Tophaceous gout  Gouty arthritis  Subjective: Pt has severe rt knee pain Had bed side arthrocentesis and 200 cc bloody pus removed by Dr.Miller  Medications:  . sodium chloride   Intravenous Once  . allopurinol  100 mg Oral Daily  . colchicine  0.6 mg Oral Daily  . docusate sodium  100 mg Oral BID  . enoxaparin (LOVENOX) injection  40 mg Subcutaneous Q24H  . folic acid  1 mg Oral Daily  . furosemide  20 mg Oral BID  . ipratropium-albuterol  3 mL Nebulization TID  . metoprolol tartrate  25 mg Oral BID  . multivitamin with minerals  1 tablet Oral Daily  . pantoprazole  40 mg Oral Daily  . predniSONE  50 mg Oral Q breakfast  . sodium chloride flush  3 mL Intravenous Once  . tamsulosin  0.4 mg Oral Daily  . thiamine  100 mg Oral Daily   Or  . thiamine  100 mg Intravenous Daily  . cyanocobalamin  1,000 mcg Oral Daily    Objective: Vital signs in last 24 hours: Temp:  [97.9 F (36.6 C)-98.4 F (36.9 C)] 97.9 F (36.6 C) (03/06 0745) Pulse Rate:  [89-119] 94 (03/06 0745) Resp:  [12-24] 18 (03/06 0745) BP: (129-151)/(88-119) 147/88 (03/06 0745) SpO2:  [93 %-99 %] 98 % (03/06 0745) Weight:  [90.2 kg] 90.2 kg (03/05 1217)  PHYSICAL EXAM:  General: Alert, cooperative, no distress, appears stated age.  Lungs: Clear to auscultation bilaterally. No Wheezing or Rhonchi. No rales. Heart: Regular rate and rhythm, no murmur, rub or gallop. Abdomen: Soft,  distended. Bowel sounds normal. No masses Extremities: rt elbow eroded tophaceous gout ulcer  MCp joints have tophi Skin: No rashes or lesions. Or bruising Lymph: Cervical, supraclavicular normal. Neurologic: Grossly non-focal  Lab Results Recent Labs    08/31/18 0414 09/01/18 0510  WBC 21.7* 22.8*  HGB 8.5* 8.3*  HCT 25.9* 25.7*  NA 132* 134*  K 4.1 4.3  CL 99 98   CO2 25 28  BUN 23* 21*  CREATININE 1.07 0.99   Liver Panel No results for input(s): PROT, ALBUMIN, AST, ALT, ALKPHOS, BILITOT, BILIDIR, IBILI in the last 72 hours. Sedimentation Rate No results for input(s): ESRSEDRATE in the last 72 hours. C-Reactive Protein No results for input(s): CRP in the last 72 hours.  Microbiology: 2/29 BC - MRSA 3/3 BC NG 2/29 aspirate MRSA  Assessment/Plan:  MRSA bacteremia with MRSA septic arthritis rt knee- on vancomycin- MIC 0.5 Recent b/l knee arthrocentesis and kenalog injection- Rt knee fluid was not sent for culture , hence not sure how long he had the MRSA in the knee and also no blood culture was sent last time  TEE could not be done as patient was agitated with sedation  repeat blood culture NG so far- will skip TEE and treat for 6 weeks   Leucocytosis persisting- discussed with ortho- repeat aspiration done today- may need washout if needed on Sunday. Prednisone has been stopped cultured the rt elbow ulcer to see whether it has MRSA ? Gout-  Management as per primary team  CHF   Anemia  Cirrhosis liver  ID will follow him peripherally this weekend

## 2018-09-01 NOTE — Progress Notes (Signed)
Per discussion with infectious disease consultant, will cancel transesophageal echo as treatment will proceed with 4 to 6 weeks of IV antibiotics regardless of TEE data.  Will cancel TEE for now.

## 2018-09-01 NOTE — Progress Notes (Signed)
Pharmacy Antibiotic Note  Larry Andrade. is a 58 y.o. male admitted on 08/26/2018 with sepsis.  Patient has knee wound with pus, septic arthritis with MRSA in synovial fluid and blood cultures (2/29).  Pharmacy has been consulted for Vancomycin dosing.  Plan: Patient originally on Vancomycin maintenance dosing of 1000 mg IV q12h.  Estimated Kinetics Using IBW of 80 kg, Scr 1.34, and Vd 0.7 Goal AUC 400-550 Anticipated AUC 490.2  However, based on assessing several levels, pt doesn't clear vancomycin as expected from renal function - Vancomycin was held for 2 doses while waiting for trough to reach acceptable level for redosing  3/3 1707 last dose 1000mg  (at 1g q12) 3/3 2046 Vanc peak 38 3/4 0507 Vanc trough 31 (redrew in case error) 3/4 0941 Vanc trough 28 (didn't appear to be error) 3/5 0414 Vanc random 15  Running Vancomycin 2 Level PK Calculator  Ke = 0.0295, t 1/2 = 23.5 hr  Will check vanc random 3/6 @ 2000 to assure clearing, consider dosing at 1250mg  q 24h if vancomycin clearing appropriately  3/6:  Vanc random @ 2019 = 11 mcg/mL    Will order Vancomycin 1250 mg IV Q24H to start on 3/6 @ 2100. Will order Vanc peak 90 minutes after 4 th dose on 3/10 @ 0030 and draw trough 30 minutes before 5th dose on 3/10 @ 2030.   Recent Labs  Lab 08/26/18 1808  08/27/18 0759 08/27/18 1404  08/28/18 0310 08/28/18 1351 08/29/18 0047 08/29/18 0049 08/29/18 2046 08/30/18 0507 08/30/18 0941 08/31/18 0414 09/01/18 0510 09/01/18 2019  WBC  --    < >  --   --    < > 22.4*  --   --  23.2*  --  22.3*  --  21.7* 22.8*  --   CREATININE  --    < >  --   --   --  1.19  --   --  1.32*  --  1.08  --  1.07 0.99  --   LATICACIDVEN 2.6*  --  2.3* 2.5*  --   --  2.5* 2.0*  --   --   --   --   --   --   --   VANCOTROUGH  --   --   --   --   --   --   --   --   --   --  31* 28*  --   --   --   VANCOPEAK  --   --   --   --   --   --   --   --   --  73  --   --   --   --   --   VANCORANDOM   --   --   --   --   --   --   --   --   --   --   --   --  15  --  11   < > = values in this interval not displayed.    Estimated Creatinine Clearance: 95.3 mL/min (by C-G formula based on SCr of 0.99 mg/dL).    Allergies  Allergen Reactions  . Penicillins Rash    Has patient had a PCN reaction causing immediate rash, facial/tongue/throat swelling, SOB or lightheadedness with hypotension: No Has patient had a PCN reaction causing severe rash involving mucus membranes or skin necrosis: No Has patient had a PCN reaction that required hospitalization: No  Has patient had a PCN reaction occurring within the last 10 years: No If all of the above answers are "NO", then may proceed with Cephalosporin use.     Antimicrobials this admission:   >>    >>   Dose adjustments this admission:   Microbiology results:  BCx:   UCx:    Sputum:    MRSA PCR:   Thank you for allowing pharmacy to be a part of this patient's care.  Syrina Wake D 09/01/2018 10:14 PM

## 2018-09-02 DIAGNOSIS — M009 Pyogenic arthritis, unspecified: Secondary | ICD-10-CM

## 2018-09-02 LAB — BASIC METABOLIC PANEL
Anion gap: 10 (ref 5–15)
Anion gap: 8 (ref 5–15)
BUN: 23 mg/dL — AB (ref 6–20)
BUN: 21 mg/dL — ABNORMAL HIGH (ref 6–20)
CO2: 27 mmol/L (ref 22–32)
CO2: 28 mmol/L (ref 22–32)
Calcium: 8.1 mg/dL — ABNORMAL LOW (ref 8.9–10.3)
Calcium: 8 mg/dL — ABNORMAL LOW (ref 8.9–10.3)
Chloride: 98 mmol/L (ref 98–111)
Chloride: 97 mmol/L — ABNORMAL LOW (ref 98–111)
Creatinine, Ser: 0.93 mg/dL (ref 0.61–1.24)
Creatinine, Ser: 1.05 mg/dL (ref 0.61–1.24)
GFR calc Af Amer: >60 mL/min (ref >60)
GFR calc Af Amer: >60 mL/min (ref >60)
GFR calc non Af Amer: >60 mL/min (ref >60)
GFR calc non Af Amer: >60 mL/min (ref >60)
GLUCOSE: 138 mg/dL — AB (ref 70–99)
Glucose, Bld: 110 mg/dL — ABNORMAL HIGH (ref 70–99)
Potassium: 4 mmol/L (ref 3.5–5.1)
Potassium: 3.9 mmol/L (ref 3.5–5.1)
Sodium: 135 mmol/L (ref 135–145)
Sodium: 133 mmol/L — ABNORMAL LOW (ref 135–145)

## 2018-09-02 LAB — CBC
HCT: 26.4 % — ABNORMAL LOW (ref 39.0–52.0)
Hemoglobin: 8.4 g/dL — ABNORMAL LOW (ref 13.0–17.0)
MCH: 32.1 pg (ref 26.0–34.0)
MCHC: 31.8 g/dL (ref 30.0–36.0)
MCV: 100.8 fL — ABNORMAL HIGH (ref 80.0–100.0)
Platelets: 235 K/uL (ref 150–400)
RBC: 2.62 MIL/uL — ABNORMAL LOW (ref 4.22–5.81)
RDW: 20.6 % — ABNORMAL HIGH (ref 11.5–15.5)
WBC: 23.5 K/uL — ABNORMAL HIGH (ref 4.0–10.5)
nRBC: 0 % (ref 0.0–0.2)

## 2018-09-02 LAB — MRSA PCR SCREENING: MRSA by PCR: NEGATIVE

## 2018-09-02 LAB — MAGNESIUM: Magnesium: 1.3 mg/dL — ABNORMAL LOW (ref 1.7–2.4)

## 2018-09-02 LAB — GLUCOSE, CAPILLARY: Glucose-Capillary: 110 mg/dL — ABNORMAL HIGH (ref 70–99)

## 2018-09-02 MED ORDER — MAGNESIUM SULFATE 4 GM/100ML IV SOLN
4.0000 g | Freq: Once | INTRAVENOUS | Status: AC
  Administered 2018-09-02: 4 g via INTRAVENOUS
  Filled 2018-09-02: qty 100

## 2018-09-02 MED ORDER — DEXTROSE IN LACTATED RINGERS 5 % IV SOLN
INTRAVENOUS | Status: DC
  Administered 2018-09-02: 21:00:00 via INTRAVENOUS

## 2018-09-02 MED ORDER — DILTIAZEM LOAD VIA INFUSION
10.0000 mg | Freq: Once | INTRAVENOUS | Status: AC
  Administered 2018-09-02: 10 mg via INTRAVENOUS
  Filled 2018-09-02: qty 10

## 2018-09-02 MED ORDER — METOPROLOL TARTRATE 5 MG/5ML IV SOLN: 5.0000 mg | INTRAVENOUS | Status: DC

## 2018-09-02 MED ORDER — DIGOXIN 0.25 MG/ML IJ SOLN
0.2500 mg | Freq: Once | INTRAMUSCULAR | Status: AC
  Administered 2018-09-02: 0.25 mg via INTRAVENOUS
  Filled 2018-09-02: qty 2

## 2018-09-02 MED ORDER — DILTIAZEM HCL-DEXTROSE 100-5 MG/100ML-% IV SOLN (PREMIX)
5.0000 mg/h | INTRAVENOUS | Status: DC
  Administered 2018-09-02: 12.5 mg/h via INTRAVENOUS
  Administered 2018-09-02 – 2018-09-03 (×2): 15 mg/h via INTRAVENOUS
  Filled 2018-09-02 (×4): qty 100

## 2018-09-02 MED ORDER — SODIUM CHLORIDE 0.9 % IV BOLUS: 1000.0000 mL | Freq: Once | INTRAVENOUS | Status: DC

## 2018-09-02 MED ORDER — DILTIAZEM HCL 25 MG/5ML IV SOLN
INTRAVENOUS | Status: AC
  Filled 2018-09-02: qty 5

## 2018-09-02 MED ORDER — THIAMINE HCL 100 MG/ML IJ SOLN
Freq: Once | INTRAVENOUS | Status: AC
  Administered 2018-09-02: 22:00:00 via INTRAVENOUS
  Filled 2018-09-02: qty 1000

## 2018-09-02 MED ORDER — METOPROLOL TARTRATE 5 MG/5ML IV SOLN
5.0000 mg | Freq: Once | INTRAVENOUS | Status: AC
  Administered 2018-09-02: 5 mg via INTRAVENOUS
  Filled 2018-09-02: qty 5

## 2018-09-02 NOTE — Progress Notes (Signed)
Physical Therapy Treatment Patient Details Name: Larry Andrade. MRN: [id number] DOB: 10-Jun-1961 Today's Date: 09/02/2018    History of Present Illness 58 y.o. male presenting to hospital 08/26/18 with increased knee pain and swelling, and weakness; recent hospital stay secondary CHF and gout flare.  Pt admitted with sepsis from R knee septic joint.  R knee aspirated in ED.  S/p R knee arthroscopic I&D with extensive synovectomy 08/26/18.  PMH includes CHF, gout, htn, EtOH liver cirrhosis.    PT Comments    Patient received in recliner, agrees to PT. Reports right knee pain, 8/10 when walking. Patient requires mod assist to stand from low recliner. Min guard to walk 40 feet with RW. Distance limited by increasing pain. Patient will continue to benefit from skilled PT while here to improve functional mobility, strength and safety to return home at discharge.      Follow Up Recommendations  Home health PT     Equipment Recommendations  Rolling walker with 5" wheels    Recommendations for Other Services       Precautions / Restrictions Precautions Precautions: Fall Restrictions Weight Bearing Restrictions: No RLE Weight Bearing: Weight bearing as tolerated    Mobility  Bed Mobility               General bed mobility comments: not assessed, patient in recliner upon arrival and remained in recliner  Transfers Overall transfer level: Needs assistance Equipment used: Rolling walker (2 wheeled) Transfers: Sit to/from Stand Sit to Stand: Min assist            Ambulation/Gait Ambulation/Gait assistance: Min guard Gait Distance (Feet): 40 Feet Assistive device: Rolling walker (2 wheeled) Gait Pattern/deviations: Antalgic;Decreased stance time - right;Step-to pattern Gait velocity: decreased       Stairs             Wheelchair Mobility    Modified Rankin (Stroke Patients Only)       Balance Overall balance assessment: Modified  Independent Sitting-balance support: Feet supported Sitting balance-Leahy Scale: Normal     Standing balance support: Bilateral upper extremity supported Standing balance-Leahy Scale: Good                              Cognition Arousal/Alertness: Awake/alert Behavior During Therapy: WFL for tasks assessed/performed Overall Cognitive Status: Within Functional Limits for tasks assessed                                        Exercises Total Joint Exercises Ankle Circles/Pumps: AROM;10 reps;Supine Heel Slides: AROM;10 reps;Supine    General Comments        Pertinent Vitals/Pain Pain Assessment: 0-10 Pain Score: 8  Pain Location: R knee Pain Descriptors / Indicators: Aching;Discomfort;Sore;Operative site guarding Pain Intervention(s): Limited activity within patient's tolerance;Monitored during session;Repositioned;Ice applied    Home Living                      Prior Function            PT Goals (current goals can now be found in the care plan section) Acute Rehab PT Goals Patient Stated Goal: to decrease R knee pain PT Goal Formulation: With patient Time For Goal Achievement: 09/11/18 Potential to Achieve Goals: Good Progress towards PT goals: Progressing toward goals    Frequency    Min 2X/week  PT Plan Discharge plan needs to be updated    Co-evaluation              AM-PAC PT "6 Clicks" Mobility   Outcome Measure  Help needed turning from your back to your side while in a flat bed without using bedrails?: A Little Help needed moving from lying on your back to sitting on the side of a flat bed without using bedrails?: A Little Help needed moving to and from a bed to a chair (including a wheelchair)?: A Little Help needed standing up from a chair using your arms (e.g., wheelchair or bedside chair)?: A Little Help needed to walk in hospital room?: A Little Help needed climbing 3-5 steps with a railing? : A  Little 6 Click Score: 18    End of Session Equipment Utilized During Treatment: Gait belt Activity Tolerance: Patient limited by fatigue;Patient limited by pain Patient left: in chair;with call bell/phone within reach Nurse Communication: Mobility status PT Visit Diagnosis: Muscle weakness (generalized) (M62.81);Unsteadiness on feet (R26.81);Difficulty in walking, not elsewhere classified (R26.2);Pain Pain - Right/Left: Right Pain - part of body: Knee     Time: 4268-3419 PT Time Calculation (min) (ACUTE ONLY): 20 min  Charges:  $Gait Training: 8-22 mins                     Justyne Roell, PT, GCS 09/02/18,11:40 AM

## 2018-09-02 NOTE — Progress Notes (Signed)
Called report to the ICU RN at this time. All questions answered. Will tube any medications if needed. Will transport momentarily. Jari Favre [facility]

## 2018-09-02 NOTE — Progress Notes (Signed)
Report called to [facility]

## 2018-09-02 NOTE — Progress Notes (Signed)
Subjective: 7 Days Post-Op Procedure(s) (LRB): ARTHROSCOPY KNEE (Right)   The patient reports that he feels about the same.  He has some knee pain.  Ever a did get up and walk with PT today.  His heart rate shot up to 1 60-1 70.  He was initially transferred to look to telemetry but then was sent to the ICU for treatment.  His white count remains unchanged.  Lab work is unchanged otherwise.  Patient reports pain as moderate.  Objective:   VITALS:   Vitals:   09/02/18 1833 09/02/18 2235  BP: 105/80 95/66  Pulse:  (!) 137  Resp: 19   Temp: 99.6 F (37.6 C)   SpO2:      Neurologically intact ABD soft Neurovascular intact Sensation intact distally Intact pulses distally Dorsiflexion/Plantar flexion intact Incision: no drainage   The knee has reaccumulated some fluid.  Is not red or hot.  His drainage incisions are healing well.  Does have pain with movement.  LABS Recent Labs    08/31/18 0414 09/01/18 0510 09/02/18 0534  HGB 8.5* 8.3* 8.4*  HCT 25.9* 25.7* 26.4*  WBC 21.7* 22.8* 23.5*  PLT 202 192 235    Recent Labs    09/01/18 0510 09/02/18 0534 09/02/18 1849  NA 134* 135 133*  K 4.3 4.0 3.9  BUN 21* 23* 21*  CREATININE 0.99 0.93 1.05  GLUCOSE 122* 138* 110*    No results for input(s): LABPT, INR in the last 72 hours.   Assessment/Plan: 7 Days Post-Op Procedure(s) (LRB): ARTHROSCOPY KNEE (Right)   Up with therapy as tolerated by heart rate.   I think he probably should have another washout of the knee but given his current cardiac problems will put this off until Monday morning at the earliest. We will reevaluate him tomorrow for this. Repeat culture yesterday showed a few gram-positive cocci

## 2018-09-02 NOTE — Progress Notes (Addendum)
[employer] - [place] at [facility]   PATIENT NAME: Larry Andrade    MR#:  [id number]  DATE OF BIRTH:  08-05-1960  SUBJECTIVE:  CHIEF COMPLAINT:   Chief Complaint  Patient presents with  . Knee Pain   No new complaints this morning.  No fevers.  Had aspiration of right knee joint done by orthopedic physician yesterday. REVIEW OF SYSTEMS:  Review of Systems  Constitutional: Negative for chills and fever.  HENT: Negative for hearing loss and tinnitus.   Eyes: Negative for blurred vision and double vision.  Respiratory: Negative for cough, sputum production and shortness of breath.   Cardiovascular: Negative for chest pain, palpitations and orthopnea.  Gastrointestinal: Negative for heartburn, nausea and vomiting.  Genitourinary: Negative for dysuria and urgency.  Musculoskeletal: Negative for myalgias and neck pain.       Gradual improvement in right knee swelling  Skin: Negative for itching and rash.  Neurological: Negative for dizziness and headaches.  Psychiatric/Behavioral: Negative for depression and hallucinations.    DRUG ALLERGIES:   Allergies  Allergen Reactions  . Penicillins Rash    Has patient had a PCN reaction causing immediate rash, facial/tongue/throat swelling, SOB or lightheadedness with hypotension: No Has patient had a PCN reaction causing severe rash involving mucus membranes or skin necrosis: No Has patient had a PCN reaction that required hospitalization: No Has patient had a PCN reaction occurring within the last 10 years: No If all of the above answers are "NO", then may proceed with Cephalosporin use.    VITALS:  Blood pressure (!) 149/109, pulse (!) 109, temperature 98 F (36.7 C), temperature source Oral, resp. rate 18, weight 90.2 kg, SpO2 98 %. PHYSICAL EXAMINATION:   Physical Exam  Constitutional: He is oriented to person, place, and time. He appears well-developed and well-nourished.  HENT:  Head: Normocephalic and  atraumatic.  Right Ear: External ear normal.  Mouth/Throat: Oropharynx is clear and moist. No oropharyngeal exudate.  Eyes: Pupils are equal, round, and reactive to light. Conjunctivae and EOM are normal. Right eye exhibits no discharge.  Neck: Normal range of motion. No tracheal deviation present.  Cardiovascular: Normal rate, regular rhythm and normal heart sounds.  Respiratory: Effort normal and breath sounds normal. No stridor. He has no wheezes.  GI: Soft. Bowel sounds are normal. There is no abdominal tenderness.  Musculoskeletal:        General: Edema present. No tenderness.     Comments: Dressing in place over right knee joint.  Neurological: He is alert and oriented to person, place, and time. No cranial nerve deficit.  Skin: Skin is warm. No erythema.  Psychiatric: He has a normal mood and affect. His behavior is normal.     LABORATORY PANEL:  Male CBC Recent Labs  Lab 09/02/18 0534  WBC 23.5*  HGB 8.4*  HCT 26.4*  PLT 235   ------------------------------------------------------------------------------------------------------------------ Chemistries  Recent Labs  Lab 08/30/18 0507  09/01/18 0510 09/02/18 0534  NA 134*   < > 134* 135  K 4.7   < > 4.3 4.0  CL 102   < > 98 98  CO2 23   < > 28 27  GLUCOSE 98   < > 122* 138*  BUN 25*   < > 21* 23*  CREATININE 1.08   < > 0.99 0.93  CALCIUM 7.6*   < > 8.0* 8.1*  MG 1.8  --   --   --   AST  --   --  41  --   ALT  --   --  29  --   ALKPHOS  --   --  243*  --   BILITOT  --   --  2.0*  --    < > = values in this interval not displayed.   RADIOLOGY:  No results found. ASSESSMENT AND PLAN:   58 year old male with EtOH liver cirrhosis, chronic diastolic heart failure with preserved ejection fraction, ongoing tobacco abuse who was discharged from the hospital on2/24/2020with exacerbation of CHF and gout flare,who thenpresentedto the emergency room with right knee swelling and pain.   Plan: 1. Sepsis from  right knee septic joint, resolving:Patient presentedwith tachycardia, tachypnea and leukocytosis Gram stain showing gram-positive cocci, blood culture growing MRSA, synovial fluid cultureS Aureus.  Patient still has leukocytosis with white count of 23,000. Seen by infectious disease specialist.  Recommendation is to continue vancomycin for 4-6 weeks IV per ID/Dr. Rivka Safer.  S/p R knee arthroscopic I&D withDr. Martha Clan.  Patient had about 120 cc aspirated yesterday from the right knee.  Orthopedic surgery to reevaluate right knee tomorrow and make a decision regarding re-washing No evidence of DVT on venous Doppler ultrasound recently.   Lactic acid2.51from 2.44most recently. DiscontinuedIV fluidsand encouragedoral intake. Seen by PT recommended SNF.Continue towork with PT as tolerated.Reassess for needs as activity tolerance increases with pain control. 09/01/2018 TEE not performed. See note Dr. Lady Gary. Will require 4-6 weeks IV abx regardless. PICC line to be placed prior to discharge, likely on Monday morning after the weekend  2. Chronic diastolic heart failure with preserved ejection fraction:No signs of exacerbation at this time. Continue to monitor. ContinuehomeLasix.  3. EtOH abuse with liver cirrhosis: Patient reports his last drink was Wednesday prior to admission CIWA protocoldiscontinued. No agitationx 6 days No ascites on recent abdominal ultrasound.  4.Hypomagnesemia, hypocalcemia:Repleted,   5.Gout:Continue allopurinol and colchicine. Monitor.  Improving  6. Essential hypertension:Continue metoprolol  7. Anemia Hbgstable 8.4today. Macrocytic. Chronic EtOH abuse. 1 u pRBCsgiven prior. Recheck AM labs. Added on folate and B12 check,within normal limits.  8.Tobacco dependence:Patient is encouraged to quit smoking. Counseling was providedand will be a discharge.  9. VKF:MMCRFVOH tamsulosin   10. CoughPRNs and breathing  treatmentsordered. Monitor clinically. Reassess for acute cardiopulmonary processes/CHF exacerbation as needed.  Given his smoking history (40 pack-year history) and the duration of the cough (months) suspect he has underlyingchroniclung disease,question COPD. Did have a CXR recently, some scarring at the lung base only.Patient should be encouraged tofollow-up outpatient for PFTs withaPCP.  11.  Atrial fibrillation with rapid ventricular response. Heart rate in the 160s.  Patient denies any chest pain.  No shortness of breath. Currently no evidence of alcohol withdrawal clinically. Ordered a dose of 5 mg of IV Lopressor.  Unable to do IV Cardizem on this floor. Being transferred to 2A; telemetry floor Consult placed for cardiologist.  Dr. Lady Gary to see patient.  DVT prophylaxis; Lovenox   All the records are reviewed and case discussed with Care Management/Social Worker. Management plans discussed with the patient, family and they are in agreement.  CODE STATUS: Full Code  TOTAL TIME TAKING CARE OF THIS PATIENT: 36 minutes.   More than 50% of the time was spent in counseling/coordination of care: YES  POSSIBLE D/C IN 2 DAYS, DEPENDING ON CLINICAL CONDITION.   Xia Stohr M.D on 09/02/2018 at 12:13 PM  Between 7am to 6pm - Pager - [phone]  After 6pm go to [email or web address] - Therapist, nutritional  Hospitalists  Office  [phone]  CC: Primary care physician; Gildardo Pounds, PA  Note: This dictation was prepared with Dragon dictation along with smaller phrase technology. Any transcriptional errors that result from this process are unintentional.

## 2018-09-02 NOTE — Progress Notes (Signed)
Central tele notified this nurse pts HR in the 160's-170's. MD Ojie at nurses station and notified. MD at bedside assessing pt, Orders received for STAT EKG , 5 mg IV Metoprolol. After IV Metoprolol pts HR remains the same. MD transferring pt to 2nd floor telemetry.

## 2018-09-03 LAB — CBC
HCT: 27.2 % — ABNORMAL LOW (ref 39.0–52.0)
Hemoglobin: 8.7 g/dL — ABNORMAL LOW (ref 13.0–17.0)
MCH: 32.5 pg (ref 26.0–34.0)
MCHC: 32 g/dL (ref 30.0–36.0)
MCV: 101.5 fL — ABNORMAL HIGH (ref 80.0–100.0)
Platelets: 235 K/uL (ref 150–400)
RBC: 2.68 MIL/uL — ABNORMAL LOW (ref 4.22–5.81)
RDW: 21 % — ABNORMAL HIGH (ref 11.5–15.5)
WBC: 23 K/uL — ABNORMAL HIGH (ref 4.0–10.5)
nRBC: 0 % (ref 0.0–0.2)

## 2018-09-03 LAB — BASIC METABOLIC PANEL WITH GFR
Anion gap: 7 (ref 5–15)
BUN: 19 mg/dL (ref 6–20)
CO2: 28 mmol/L (ref 22–32)
Calcium: 8 mg/dL — ABNORMAL LOW (ref 8.9–10.3)
Chloride: 96 mmol/L — ABNORMAL LOW (ref 98–111)
Creatinine, Ser: 1 mg/dL (ref 0.61–1.24)
GFR calc Af Amer: >60 mL/min
GFR calc non Af Amer: >60 mL/min
Glucose, Bld: 84 mg/dL (ref 70–99)
Potassium: 3.9 mmol/L (ref 3.5–5.1)
Sodium: 131 mmol/L — ABNORMAL LOW (ref 135–145)

## 2018-09-03 LAB — CULTURE, BLOOD (ROUTINE X 2)
CULTURE: NO GROWTH
Culture: NO GROWTH
Special Requests: ADEQUATE

## 2018-09-03 LAB — MAGNESIUM: Magnesium: 2.1 mg/dL (ref 1.7–2.4)

## 2018-09-03 LAB — PHOSPHORUS: Phosphorus: 3.2 mg/dL (ref 2.5–4.6)

## 2018-09-03 MED ORDER — AMIODARONE HCL IN DEXTROSE 360-4.14 MG/200ML-% IV SOLN
60.0000 mg/h | INTRAVENOUS | Status: AC
  Administered 2018-09-03 (×2): 60 mg/h via INTRAVENOUS
  Filled 2018-09-03 (×2): qty 200

## 2018-09-03 MED ORDER — LIDOCAINE-EPINEPHRINE (PF) 1.5 %-1:200000 IJ SOLN
30.0000 mL | Freq: Once | INTRAMUSCULAR | Status: AC
  Administered 2018-09-03: 30 mL via INTRADERMAL
  Filled 2018-09-03 (×2): qty 30

## 2018-09-03 MED ORDER — DILTIAZEM HCL-DEXTROSE 100-5 MG/100ML-% IV SOLN (PREMIX): 5.0000 mg/h | INTRAVENOUS | Status: DC

## 2018-09-03 MED ORDER — AMIODARONE HCL IN DEXTROSE 360-4.14 MG/200ML-% IV SOLN
30.0000 mg/h | INTRAVENOUS | Status: DC
  Administered 2018-09-03 – 2018-09-04 (×2): 30 mg/h via INTRAVENOUS
  Filled 2018-09-03 (×2): qty 200

## 2018-09-03 MED ORDER — DILTIAZEM HCL 30 MG PO TABS
30.0000 mg | ORAL_TABLET | Freq: Four times a day (QID) | ORAL | Status: DC
  Administered 2018-09-03: 30 mg via ORAL
  Filled 2018-09-03: qty 1

## 2018-09-03 MED ORDER — AMIODARONE LOAD VIA INFUSION
150.0000 mg | Freq: Once | INTRAVENOUS | Status: AC
  Administered 2018-09-03: 150 mg via INTRAVENOUS
  Filled 2018-09-03: qty 83.34

## 2018-09-03 NOTE — Progress Notes (Signed)
Pt is A & O X 4, Sitting up in bed watching TV. HR 96. Cardizem drip at 15 mg/hr on assessment. Will continue to monitor.

## 2018-09-03 NOTE — Consult Note (Signed)
Cardiology Consultation Note    Patient ID: Larry HumanJohn Miggins Jr., MRN: [id number], DOB/AGE: July 18, 1960 58 y.o. Admit date: 08/26/2018   Date of Consult: 09/03/2018 Primary Physician: Gildardo PoundsWhitten, Robin A, PA Primary Cardiologist:    Chief Complaint: right knee pain Reason for Consultation: afib with rvr Requesting MD: Dr. Enid Baasjie  HPI: Larry HumanJohn Varnell Jr. is a 58 y.o. male with history of chronic diastolic heart failure, history of alcoholic cirrhosis who was admitted with progressive right knee pain.  Initially was diagnosed with gout however presented with fever malaise and right lower extremity edema.  Blood cultures were positive for MRSA.  He was subsequently treated with antibiotics.  He since has developed atrial fibrillation with rapid ventricular rate [place].  He was given IV Cardizem without improvement.  Transferred to the ICU where his heart rate has remained fairly rapid.  He has been hemodynamically stable.  He currently is in atrial fibrillation with a ventricular rate in the low 100s.  He complains of knee pain.  He denies chest pain.  Past Medical History:  Diagnosis Date  . CHF (congestive heart failure) (HCC)   . Cirrhosis (HCC)   . GERD (gastroesophageal reflux disease)   . Gout   . Hypertension   . Pancreatitis       Surgical History:  Past Surgical History:  Procedure Laterality Date  . CARDIAC CATHETERIZATION    . COLON SURGERY    . ESOPHAGOGASTRODUODENOSCOPY (EGD) WITH PROPOFOL N/A 10/26/2017   Procedure: ESOPHAGOGASTRODUODENOSCOPY (EGD) WITH PROPOFOL;  Surgeon: Wyline MoodAnna, Kiran, MD;  [facility];  Service: Endoscopy;  Laterality: N/A;  . KNEE ARTHROSCOPY Right 08/26/2018   Procedure: ARTHROSCOPY KNEE;  Surgeon: Juanell FairlyKrasinski, Kevin, MD;  Location: [facility] ORS;  Service: Orthopedics;  Laterality: Right;     Home Meds: Prior to Admission medications   Medication Sig Start Date End Date Taking? Authorizing Provider  albuterol (PROVENTIL) (2.5 MG/3ML) 0.083% nebulizer solution  Take 2.5 mg by nebulization every 6 (six) hours as needed for wheezing or shortness of breath.   Yes [provider]  allopurinol (ZYLOPRIM) 100 MG tablet Take 1 tablet (100 mg total) by mouth daily. 02/25/17  Yes Enid BaasKalisetti, Radhika, MD  colchicine 0.6 MG tablet Take 1 tablet (0.6 mg total) by mouth daily. 08/21/18  Yes Shaune Pollackhen, Qing, MD  folic acid (FOLVITE) 1 MG tablet Take 1 tablet (1 mg total) by mouth daily. 08/21/18  Yes Shaune Pollackhen, Qing, MD  metoprolol tartrate (LOPRESSOR) 25 MG tablet Take 25 mg by mouth 2 (two) times daily.   Yes [provider]  Multiple Vitamin (MULTIVITAMIN WITH MINERALS) TABS tablet Take 1 tablet by mouth daily. 10/26/17  Yes Wieting, Richard, MD  oxyCODONE (OXY IR/ROXICODONE) 5 MG immediate release tablet Take 5 mg by mouth every 4 (four) hours as needed for severe pain.    Yes [provider]  vitamin B-12 1000 MCG tablet Take 1 tablet (1,000 mcg total) by mouth daily. 08/21/18  Yes Shaune Pollackhen, Qing, MD    Inpatient Medications:  . sodium chloride   Intravenous Once  . allopurinol  100 mg Oral Daily  . amiodarone  150 mg Intravenous Once  . colchicine  0.6 mg Oral Daily  . diltiazem  30 mg Oral Q6H  . docusate sodium  100 mg Oral BID  . enoxaparin (LOVENOX) injection  40 mg Subcutaneous Q24H  . folic acid  1 mg Oral Daily  . furosemide  20 mg Oral BID  . lidocaine (PF)  30 mL Infiltration Once  .  lidocaine (PF)  60 mL Infiltration Once  . metoprolol tartrate  25 mg Oral BID  . multivitamin with minerals  1 tablet Oral Daily  . pantoprazole  40 mg Oral Daily  . sodium chloride flush  3 mL Intravenous Once  . tamsulosin  0.4 mg Oral Daily  . thiamine  100 mg Oral Daily   Or  . thiamine  100 mg Intravenous Daily  . vancomycin variable dose per unstable renal function (pharmacist dosing)   Does not apply See admin instructions  . cyanocobalamin  1,000 mcg Oral Daily   . sodium chloride    . amiodarone     Followed by  . amiodarone    . dextrose  5% lactated ringers 75 mL/hr at 09/02/18 2057  . diltiazem (CARDIZEM) infusion    . methocarbamol (ROBAXIN) IV    . vancomycin Stopped (09/03/18 0015)    Allergies:  Allergies  Allergen Reactions  . Penicillins Rash    Has patient had a PCN reaction causing immediate rash, facial/tongue/throat swelling, SOB or lightheadedness with hypotension: No Has patient had a PCN reaction causing severe rash involving mucus membranes or skin necrosis: No Has patient had a PCN reaction that required hospitalization: No Has patient had a PCN reaction occurring within the last 10 years: No If all of the above answers are "NO", then may proceed with Cephalosporin use.     Social History   Socioeconomic History  . Marital status: Single    Spouse name: Not on file  . Number of children: Not on file  . Years of education: Not on file  . Highest education level: Not on file  Occupational History  . Not on file  Social Needs  . Financial resource strain: Not on file  . Food insecurity:    Worry: Not on file    Inability: Not on file  . Transportation needs:    Medical: Not on file    Non-medical: Not on file  Tobacco Use  . Smoking status: Current Every Day Smoker  . Smokeless tobacco: Never Used  Substance and Sexual Activity  . Alcohol use: Yes    Comment: 1-2 times weekly  . Drug use: No  . Sexual activity: Not on file  Lifestyle  . Physical activity:    Days per week: Not on file    Minutes per session: Not on file  . Stress: Not on file  Relationships  . Social connections:    Talks on phone: Not on file    Gets together: Not on file    Attends religious service: Not on file    Active member of club or organization: Not on file    Attends meetings of clubs or organizations: Not on file    Relationship status: Not on file  . Intimate partner violence:    Fear of current or ex partner: Not on file    Emotionally abused: Not on file    Physically abused: Not on file     Forced sexual activity: Not on file  Other Topics Concern  . Not on file  Social History Narrative  . Not on file     Family History  Problem Relation Age of Onset  . CVA Mother   . CAD Mother   . CAD Father      Review of Systems: A 12-system review of systems was performed and is negative except as noted in the HPI.  Labs: No results for input(s): CKTOTAL, CKMB, TROPONINI in  the last 72 hours. Lab Results  Component Value Date   WBC 23.0 (H) 09/03/2018   HGB 8.7 (L) 09/03/2018   HCT 27.2 (L) 09/03/2018   MCV 101.5 (H) 09/03/2018   PLT 235 09/03/2018    Recent Labs  Lab 09/01/18 0510  09/03/18 0552  NA 134*   < > 131*  K 4.3   < > 3.9  CL 98   < > 96*  CO2 28   < > 28  BUN 21*   < > 19  CREATININE 0.99   < > 1.00  CALCIUM 8.0*   < > 8.0*  PROT 5.9*  --   --   BILITOT 2.0*  --   --   ALKPHOS 243*  --   --   ALT 29  --   --   AST 41  --   --   GLUCOSE 122*   < > 84   < > = values in this interval not displayed.   No results found for: CHOL, HDL, LDLCALC, TRIG No results found for: DDIMER  Radiology/Studies:  Dg Chest 2 View  Result Date: 08/16/2018 CLINICAL DATA:  Shortness of breath and congestion for 2 weeks. History of CHF. EXAM: CHEST - 2 VIEW COMPARISON:  None. FINDINGS: Cardiac silhouette is mildly enlarged. Calcified coronary artery versus stent. Calcified aortic arch. LEFT lung base scarring. No pleural effusion or focal consolidation. No pneumothorax. Old RIGHT rib fractures. Osteopenia. IMPRESSION: Mild cardiomegaly.  No acute pulmonary process. Electronically Signed   By: Awilda Metro M.D.   On: 08/16/2018 17:06   Dg Knee 1-2 Views Right  Result Date: 08/26/2018 CLINICAL DATA:  Knee pain.  No known injury.  Initial encounter. EXAM: RIGHT KNEE - 1-2 VIEW COMPARISON:  None. FINDINGS: A moderate to large knee effusion is noted. Tricompartmental degenerative changes are present. No acute fracture or dislocation. IMPRESSION: Moderate to large knee  effusion without acute bony abnormality. Tricompartment degenerative changes. Electronically Signed   By: Harmon Pier M.D.   On: 08/26/2018 18:50   Ct Tibia Fibula Right W Contrast  Result Date: 08/26/2018 CLINICAL DATA:  Right lower extremity pain and swelling. EXAM: CT OF THE LOWER RIGHT EXTREMITY WITH CONTRAST TECHNIQUE: Multidetector CT imaging of the lower right extremity was performed according to the standard protocol following intravenous contrast administration. COMPARISON:  Radiographs 08/26/2018 CONTRAST:  OMNIPAQUE IOHEXOL 300 MG/ML  SOLN FINDINGS: There is a very large and complex right knee joint effusion with enhancing synovium and synovial calcifications. There is also some air in the joint but this may be related to recent prior joint aspiration. There is also an erosive process involving the tibiofibular joint with a large erosion involving the fibular head and nearby adjacent tibia. 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 Similar findings at the ankle joint with a large complex joint effusion, synovial enhancement and nodular calcified tissue. There also scattered erosions involving the tibia, fibula and talus. Extensive erosive changes are also noted involving the cuneiforms. The anterior ankle tendons demonstrate thickening, nodularity and calcification which is also likely gout related. There is diffuse and fairly marked subcutaneous soft tissue swelling/edema/fluid involving the entire right lower extremity which would suggest cellulitis. No discrete rim enhancing drainable fluid collection to suggest an abscess. No findings for myofasciitis or pyomyositis. IMPRESSION: 1. Large complex joint effusions involving the knee joint and ankle joint as detailed above. I think the findings are most likely due to gout. Extensive  tophaceous changes. Recommend correlation with recent joint aspirations. Could not totally exclude the possibility of  superimposed septic arthritis. 2. Erosive changes and soft tissue tophi surrounding the tibiofibular joint proximally. 3. Advanced erosive changes involving the proximal and midfoot bony structures. 4. Tophaceous changes involving the anterior ankle tendons. 5. Diffuse subcutaneous soft tissue swelling/edema/fluid suggesting cellulitis. No definite findings for myofasciitis or pyomyositis. Electronically Signed   By: Rudie Meyer M.D.   On: 08/26/2018 19:23   US Venous Img Lower Unilateral Right  Result Date: 08/27/2018 CLINICAL DATA:  Right lower extremity swelling and pain. EXAM: RIGHT LOWER EXTREMITY VENOUS DOPPLER ULTRASOUND TECHNIQUE: Gray-scale sonography with graded compression, as well as color Doppler and duplex ultrasound were performed to evaluate the lower extremity deep venous systems from the level of the common femoral vein and including the common femoral, femoral, profunda femoral, popliteal and calf veins including the posterior tibial, peroneal and gastrocnemius veins when visible. The superficial great saphenous vein was also interrogated. Spectral Doppler was utilized to evaluate flow at rest and with distal augmentation maneuvers in the common femoral, femoral and popliteal veins. COMPARISON:  None. FINDINGS: Contralateral Common Femoral Vein: Respiratory phasicity is normal and symmetric with the symptomatic side. No evidence of thrombus. Normal compressibility. Common Femoral Vein: No evidence of thrombus. Normal compressibility, respiratory phasicity and response to augmentation. Saphenofemoral Junction: No evidence of thrombus. Normal compressibility and flow on color Doppler imaging. Profunda Femoral Vein: No evidence of thrombus. Normal compressibility and flow on color Doppler imaging. Femoral Vein: No evidence of thrombus. Normal compressibility, respiratory phasicity and response to augmentation. Popliteal Vein: No evidence of thrombus. Normal compressibility, respiratory phasicity  and response to augmentation. Calf Veins: No evidence of thrombus. Normal compressibility and flow on color Doppler imaging. Superficial Great Saphenous Vein: No evidence of thrombus. Normal compressibility. Venous Reflux:  None. Other Findings:  None. IMPRESSION: No evidence of deep venous thrombosis. Electronically Signed   By: Myles Rosenthal M.D.   On: 08/27/2018 09:36   Dg Chest Port 1 View  Result Date: 08/26/2018 CLINICAL DATA:  Sepsis EXAM: PORTABLE CHEST 1 VIEW COMPARISON:  08/16/2018 chest radiograph FINDINGS: The cardiomediastinal silhouette is unremarkable. There is no evidence of focal airspace disease, pulmonary edema, suspicious pulmonary nodule/mass, pleural effusion, or pneumothorax. No acute bony abnormalities are identified. Remote RIGHT rib fractures. IMPRESSION: No active disease. Electronically Signed   By: Harmon Pier M.D.   On: 08/26/2018 16:41   [place] Ascites (abdomen Limited)  Result Date: 08/18/2018 CLINICAL DATA:  Cirrhosis. EXAM: LIMITED ABDOMEN ULTRASOUND FOR ASCITES TECHNIQUE: Limited ultrasound survey for ascites was performed in all four abdominal quadrants. COMPARISON:  None. FINDINGS: Survey of the abdominal cavity demonstrates no visible ascites. IMPRESSION: No ascites identified in the peritoneal cavity by ultrasound. Electronically Signed   By: Irish Lack M.D.   On: 08/18/2018 16:40    Wt Readings from Last 3 Encounters:  09/02/18 93.6 kg  08/31/18 90.2 kg  08/21/18 90.2 kg    EKG: Atrial fibrillation with rapid ventricular response  Physical Exam:  Blood pressure 102/69, pulse 96, temperature 98 F (36.7 C), temperature source Oral, resp. rate (!) 21, height 6\' 1"  (1.854 m), weight 93.6 kg, SpO2 96 %. Body mass index is 27.22 kg/m. General: Somewhat unkempt Head: Normocephalic, atraumatic, sclera non-icteric, no xanthomas, nares are without discharge.  Neck: Negative for carotid bruits. JVD not elevated. Lungs: Clear bilaterally to auscultation without  wheezes, rales, or rhonchi. Breathing is unlabored. Heart: Irregular regular rate and rhythm  Abdomen: Soft, non-tender, non-distended with normoactive bowel sounds. No hepatomegaly. No rebound/guarding. No obvious abdominal masses. Msk:  Strength and tone appear normal for age. Extremities: Bilateral lower extremity edema.  Right knee wrapped. Neuro: Alert and oriented X 3. No facial asymmetry. No focal deficit. Moves all extremities spontaneously. Psych:  Responds to questions appropriately with a normal affect.     Assessment and Plan  58 year old male with history of preserved LV function with a septic right knee.  He has MRSA bacteremia.  He is on IV antibiotics with a planned treatment from 4 to 6 weeks.  Developed atrial fibrillation with rapid ventricular response.  Did not respond to IV Cardizem or IV digoxin.  Hemodynamically stable.  Will discontinue IV Cardizem and load with IV amiodarone to attempt to control rate. Signed, Dalia Heading MD 09/03/2018, 11:16 AM Pager: [phone]

## 2018-09-03 NOTE — Op Note (Signed)
08/26/2018  2:32 PM  PATIENT:  Larry Andrade.  58 y.o. male  PRE-OPERATIVE DIAGNOSIS:  right knee infection  POST-OPERATIVE DIAGNOSIS:  Same  PROCEDURE: Incision and drainage right knee joint at bedside  SURGEON:  Valinda Hoar MD  ASST:    ANESTHESIA: Xylocaine with epinephrine  EBL: Minimal  TOURNIQUET TIME: None  OPERATIVE FINDINGS: Copious amounts of Larry creamy pus approximately 300 cc  OPERATIVE PROCEDURE: Hope to take the patient to the operating room to do another irrigation and debridement of the knee.  However his cardiac status prevents this and Dr. Enid Baas and the nurses told me they felt he would not be ready for surgery by tomorrow either.  Therefore I elected to proceed a with a bedside procedure to decompress the large amount of fluid in the knee.  I discussed this with the patient and he was in complete agreement with this.  Risks and benefits were discussed with him as well.  After sterile prep with chlorhexidine the lateral side of the knee was infiltrated with 1.5% Xylocaine with epinephrine.  After waiting 10 minutes the leg was prepped again sterilely and draped with sterile towels.  A 1 inch incision was made laterally through subtenons skin subcutaneous tissue and joint capsule.  At this point large amounts of pus gushed out of the knee and it took 2 emesis basin to collect at all.  I was able to express virtually all the fluid with minimal discomfort to the patient.  There was virtually no bleeding.  I was unable to introduce good amount of iodoform gauze to allow for further drainage.  Wound was then dressed with 4 x 4's ABDs and gauze.  Ace bandage was applied.  On IV vancomycin.  Hopefully this will prevent the need for further surgical treatment for a few days until his cardiac status can be stabilized.  Nursing staff has instructions to reinforce the dressing as needed.  Dr. Martha Clan will return tomorrow and resume care of the patient.   Deeann Saint, MD

## 2018-09-03 NOTE — Plan of Care (Signed)
Patient resting in bed this shift, watching television.  Able to make needs known.  No acute distress noted.  Good urine output. Cold therapy continues on right knee.  Converted to sinus rhythm.  NP aware.  Will continue to monitor.

## 2018-09-03 NOTE — Care Plan (Signed)
Case reviewed with bedside nurse.  He has been evaluated by cardiology and amiodarone infusion has been started due to persistent poor control of ventricular rate with atrial fibrillation.  Patient had bedside I&D of the right knee by Dr. Hyacinth Meeker today.  Still with great amounts of pus coming from the right knee.  Patient is on vancomycin.  He is off of all steroids.  Continue recommendations by orthopedics, cardiology and infectious disease.  From our standpoint the patient may be moved to the telemetry unit as he has no titratable drips and is otherwise hemodynamically stable with no evidence of severe sepsis or septic shock.

## 2018-09-03 NOTE — Progress Notes (Signed)
[employer] - [place] [facility]   PATIENT NAME: Larry Andrade    MR#:  [id number]  DATE OF BIRTH:  05/11/1961  SUBJECTIVE:  CHIEF COMPLAINT:   Chief Complaint  Patient presents with  . Knee Pain   No new complaints this morning.  No fevers.  Patient had to be transferred to ICU on 09/02/2018 due to atrial fibrillation with rapid ventricular response.  Currently on IV Cardizem drip at 15 mg/h.  REVIEW OF SYSTEMS:  Review of Systems  Constitutional: Negative for chills and fever.  HENT: Negative for hearing loss and tinnitus.   Eyes: Negative for blurred vision and double vision.  Respiratory: Negative for cough, sputum production and shortness of breath.   Cardiovascular: Negative for chest pain, palpitations and orthopnea.  Gastrointestinal: Negative for heartburn, nausea and vomiting.  Genitourinary: Negative for dysuria and urgency.  Musculoskeletal: Negative for myalgias and neck pain.       Gradual improvement in right knee swelling  Skin: Negative for itching and rash.  Neurological: Negative for dizziness and headaches.  Psychiatric/Behavioral: Negative for depression and hallucinations.    DRUG ALLERGIES:   Allergies  Allergen Reactions  . Penicillins Rash    Has patient had a PCN reaction causing immediate rash, facial/tongue/throat swelling, SOB or lightheadedness with hypotension: No Has patient had a PCN reaction causing severe rash involving mucus membranes or skin necrosis: No Has patient had a PCN reaction that required hospitalization: No Has patient had a PCN reaction occurring within the last 10 years: No If all of the above answers are "NO", then may proceed with Cephalosporin use.    VITALS:  Blood pressure 102/69, pulse 96, temperature 98 F (36.7 C), temperature source Oral, resp. rate (!) 21, height  (1.854 m), weight 93.6 kg, SpO2 96 %. PHYSICAL EXAMINATION:   Physical Exam  Constitutional: He is oriented to person,  place, and time. He appears well-developed and well-nourished.  HENT:  Head: Normocephalic and atraumatic.  Right Ear: External ear normal.  Mouth/Throat: Oropharynx is clear and moist. No oropharyngeal exudate.  Eyes: Pupils are equal, round, and reactive to light. Conjunctivae and EOM are normal. Right eye exhibits no discharge.  Neck: Normal range of motion. No tracheal deviation present.  Cardiovascular: Normal rate, regular rhythm and normal heart sounds.  Respiratory: Effort normal and breath sounds normal. No stridor. He has no wheezes.  GI: Soft. Bowel sounds are normal. There is no abdominal tenderness.  Musculoskeletal:        General: Edema present. No tenderness.     Comments: Dressing in place over right knee joint.  Neurological: He is alert and oriented to person, place, and time. No cranial nerve deficit.  Skin: Skin is warm. No erythema.  Psychiatric: He has a normal mood and affect. His behavior is normal.     LABORATORY PANEL:  Male CBC Recent Labs  Lab 09/03/18 0552  WBC 23.0*  HGB 8.7*  HCT 27.2*  PLT 235   ------------------------------------------------------------------------------------------------------------------ Chemistries  Recent Labs  Lab 09/01/18 0510  09/03/18 0552  NA 134*   < > 131*  K 4.3   < > 3.9  CL 98   < > 96*  CO2 28   < > 28  GLUCOSE 122*   < > 84  BUN 21*   < > 19  CREATININE 0.99   < > 1.00  CALCIUM 8.0*   < > 8.0*  MG  --    < > 2.1  AST 41  --   --   ALT 29  --   --   ALKPHOS 243*  --   --   BILITOT 2.0*  --   --    < > = values in this interval not displayed.   RADIOLOGY:  No results found. ASSESSMENT AND PLAN:   58 year old male with EtOH liver cirrhosis, chronic diastolic heart failure with preserved ejection fraction, ongoing tobacco abuse who was discharged from the hospital on2/24/2020with exacerbation of CHF and gout flare,who thenpresentedto the emergency room with right knee swelling and pain.     Plan: 1. Sepsis from right knee septic joint, resolving:Patient presentedwith tachycardia, tachypnea and leukocytosis Gram stain showing gram-positive cocci, blood culture growing MRSA, synovial fluid cultureS Aureus.  Patient still has leukocytosis with white count of 23,000. Seen by infectious disease specialist.  Recommendation is to continue vancomycin for 4-6 weeks IV per ID/Dr. Rivka Safer.  S/p R knee arthroscopic I&D withDr. Martha Clan.  Patient had about 120 cc aspirated on 09/01/2018 yesterday fom the right knee.  Orthopedic surgery has rescheduled washout of right knee joint for tomorrow to allow for better control of heart rate and stabilization of cardiac issues prior to surgery. No evidence of DVT on venous Doppler ultrasound recently.   Lactic acid2.52from 2.32most recently. DiscontinuedIV fluidsand encouragedoral intake. Seen by PT recommended SNF.Continue towork with PT as tolerated.Reassess for needs as activity tolerance increases with pain control. 09/01/2018 TEE not performed. See note Dr. Lady Gary. Will require 4-6 weeks IV abx regardless. PICC line to be placed prior to discharge, likely on Monday morning after the weekend  2. Chronic diastolic heart failure with preserved ejection fraction:No signs of exacerbation at this time. Continue to monitor. ContinuehomeLasix.  3. EtOH abuse with liver cirrhosis: Patient reports his last drink was Wednesday prior to admission CIWA protocoldiscontinued. No agitationx 6 days No ascites on recent abdominal ultrasound.  4.Hypomagnesemia, hypocalcemia:Repleted,   5.Gout:Continue allopurinol and colchicine. Monitor.  Improving  6. Essential hypertension:Continue metoprolol  7. Anemia Hbgstable 8.7today. Macrocytic. Chronic EtOH abuse. 1 u pRBCsgiven prior. Recheck AM labs. Added on folate and B12 check,within normal limits.  8.Tobacco dependence:Patient is encouraged to quit smoking. Counseling  was providedand will be a discharge.  9. TKZ:SWFUXNAT tamsulosin   10. CoughPRNs and breathing treatmentsordered. Monitor clinically. Reassess for acute cardiopulmonary processes/CHF exacerbation as needed.  Given his smoking history (40 pack-year history) and the duration of the cough (months) suspect he has underlyingchroniclung disease,question COPD. Did have a CXR recently, some scarring at the lung base only.Patient should be encouraged tofollow-up outpatient for PFTs withaPCP.  11.  Atrial fibrillation with rapid ventricular response. Heart rate in the 160s.  Patient denies any chest pain.  No shortness of breath. Currently no evidence of alcohol withdrawal clinically. Patient was transferred to the ICU on 09/02/2018.  Currently on Cardizem drip.   Seen by cardiologist this morning with plans to discontinue Cardizem and load with amiodarone drip for better rate control  DVT prophylaxis; Lovenox   All the records are reviewed and case discussed with Care Management/Social Worker. Management plans discussed with the patient, family and they are in agreement.  CODE STATUS: Full Code  TOTAL TIME TAKING CARE OF THIS PATIENT: 38 minutes.   More than 50% of the time was spent in counseling/coordination of care: YES  POSSIBLE D/C IN 2 DAYS, DEPENDING ON CLINICAL CONDITION.   Berlie Persky M.D on 09/03/2018 at 12:26 PM  Between 7am to 6pm - Pager - [phone]  After  6pm go to [email or web address] - Scientist, research (life sciences) [facility]  Office  [phone]  CC: Primary care physician; Gildardo Pounds, PA  Note: This dictation was prepared with Dragon dictation along with smaller phrase technology. Any transcriptional errors that result from this process are unintentional.

## 2018-09-03 NOTE — Progress Notes (Signed)
Subjective: 8 Days Post-Op Procedure(s) (LRB): ARTHROSCOPY KNEE (Right)   Patient continues to have a knee full of fluid with moderate pain.  Is not red and slightly slightly warm.  Does have pain with movement.  His white blood count is unchanged but is not vacillated.  His temperature is may remained afebrile.  I discussed can condition with the patient and since he is not stable from a cardiac standpoint to be taken to surgery for a general anesthetic I recommended we perform a bedside incision and drainage and he is agreeable to this.  Patient reports pain as moderate.  Objective:   VITALS:   Vitals:   09/03/18 1200 09/03/18 1300  BP: 107/65   Pulse: (!) 106 96  Resp: 20   Temp:    SpO2: 94% 96%    Neurologically intact ABD soft Neurovascular intact Sensation intact distally Intact pulses distally Dorsiflexion/Plantar flexion intact The knee is quite swollen and moderately tender.  There is no redness or significant warmth.  There is pain with movement.  Neurovascular status is good distally.   LABS Recent Labs    09/01/18 0510 09/02/18 0534 09/03/18 0552  HGB 8.3* 8.4* 8.7*  HCT 25.7* 26.4* 27.2*  WBC 22.8* 23.5* 23.0*  PLT 192 235 235    Recent Labs    09/02/18 0534 09/02/18 1849 09/03/18 0552  NA 135 133* 131*  K 4.0 3.9 3.9  BUN 23* 21* 19  CREATININE 0.93 1.05 1.00  GLUCOSE 138* 110* 84    No results for input(s): LABPT, INR in the last 72 hours.   Assessment/Plan: 8 Days Post-Op Procedure(s) (LRB): ARTHROSCOPY KNEE (Right)   Up with therapy when deemed stable by the medical service I performed the incision and drainage of the knee at the bedside without complication.  About 300 cc of pus was removed.  The wounds were redressed.  An iodoform drain was left in the wound.  We will continue IV vancomycin.  Dr. Martha Clan will resume the patient's care tomorrow.

## 2018-09-03 NOTE — Consult Note (Signed)
PULMONARY / CRITICAL CARE MEDICINE  Name: Larry Andrade. MRN: [id number] DOB: 01-19-1961    LOS: 8  Referring Provider: Dr. Enid Baas Reason for Referral: A. fib with RVR Brief patient description: 58 year old male admitted with right knee pain and underwent a right knee arthroscopy 7 days ago who is being transferred to the ICU for A. 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  He was found to have MRSA septic knee and MRSA bacteremia during right knee arthroscopic irrigation and debridement with extensive Synovectomy.  Today he developed atrial fibrillation with rapid ventricular rate in the 160s and 170s.  He was started on a diltiazem drip and transferred to the ICU for further management.  He is on 15 mg/h of diltiazem and his heart rate remains in the 140s.  He denies chest pain, dizziness, headaches, but reports palpitations. He does not have a history of atrial fibrillation at baseline.  His last echo on 08/17/2018 showed an EF of 55 to 60% with no valve abnormalities.  Past Medical History:  Diagnosis Date  . CHF (congestive heart failure) (HCC)   . Cirrhosis (HCC)   . GERD (gastroesophageal reflux disease)   . Gout   . Hypertension   . Pancreatitis    Past Surgical History:  Procedure Laterality Date  . CARDIAC CATHETERIZATION    . COLON SURGERY    . ESOPHAGOGASTRODUODENOSCOPY (EGD) WITH PROPOFOL N/A 10/26/2017   Procedure: ESOPHAGOGASTRODUODENOSCOPY (EGD) WITH PROPOFOL;  Surgeon: [name], MD;  Location: [facility] ENDOSCOPY;  Service: Endoscopy;  Laterality: N/A;  . KNEE ARTHROSCOPY Right 08/26/2018   Procedure: ARTHROSCOPY KNEE;  Surgeon: Juanell Fairly, MD;  Location: [facility] ORS;  Service:  Orthopedics;  Laterality: Right;   Prior to Admission medications   Medication Sig Start Date End Date Taking? Authorizing Provider  amLODipine (NORVASC) 5 MG tablet Take 5 mg by mouth daily.   Yes [provider]  clopidogrel (PLAVIX) 75 MG tablet Take 75 mg by mouth daily.   Yes [provider]  donepezil (ARICEPT) 5 MG tablet Take 1 tablet (5 mg total) by mouth at bedtime. 02/20/18 04/01/18 Yes Sowles, Danna Hefty, MD  empagliflozin (JARDIANCE) 25 MG TABS tablet Take 25 mg by mouth daily.   Yes [provider]  glycopyrrolate (ROBINUL) 1 MG tablet Take 1 mg by mouth 2 (two) times daily.   Yes [provider]  insulin aspart (NOVOLOG FLEXPEN) 100 UNIT/ML FlexPen Inject 12 Units into the skin 2 (two) times daily.   Yes [provider]  insulin aspart (NOVOLOG) 100 UNIT/ML FlexPen Inject 18 Units into the skin daily. At 1700   Yes [provider]  Insulin Degludec-Liraglutide (XULTOPHY) 100-3.6 UNIT-MG/ML SOPN Inject 50 Units into the skin daily.   Yes [provider]  levETIRAcetam (KEPPRA) 500 MG tablet Take 500 mg by mouth 2 (two) times daily.   Yes [provider]  lipase/protease/amylase (CREON) 12000 units CPEP capsule Take 6,000 Units by mouth 3 (three) times daily before meals.   Yes [provider]  lipase/protease/amylase (CREON) 12000 units CPEP capsule Take 3,000 Units by mouth at bedtime. With snack   Yes [provider]  lisinopril (PRINIVIL,ZESTRIL) 5 MG tablet Take 5 mg by mouth daily.   Yes [provider]  metoprolol succinate (TOPROL-XL) 25 MG 24 hr tablet Take 1 tablet (25 mg total) by mouth daily. 02/20/18  Yes Sowles, Danna Hefty, MD  rosuvastatin (CRESTOR) 40 MG tablet Take 1 tablet (40 mg total) by mouth daily. 02/20/18 04/01/18 Yes Alba Cory, MD  aspirin EC 81 MG tablet Take 81 mg by mouth daily.    [provider]  famotidine (PEPCID) 20 MG tablet Take 1 tablet (20 mg  total) by mouth 2 (two) times daily. 02/20/18 03/22/18  Alba Cory, MD  gabapentin (NEURONTIN) 300 MG capsule Take 1 capsule (300 mg total) by mouth 2 (two) times daily. 02/20/18 03/22/18  Alba Cory, MD  insulin glargine (LANTUS) 100 UNIT/ML injection Inject 0.1 mLs (10 Units total) into the skin daily. 02/20/18 03/22/18  Alba Cory, MD  lacosamide 100 MG TABS Take 1 tablet (100 mg total) by mouth 2 (two) times daily. Patient not taking: Reported on 04/01/2018 07/08/17   Enedina Finner, MD  promethazine (PHENERGAN) 12.5 MG tablet Take 1 tablet (12.5 mg total) by mouth every 6 (six) hours as needed for nausea or vomiting. Patient not taking: Reported on 04/01/2018 04/26/17   Almond Lint, MD  sertraline (ZOLOFT) 25 MG tablet Take 1 tablet (25 mg total) by mouth daily. Patient not taking: Reported on 04/01/2018 02/20/18   Alba Cory, MD   Allergies Allergies  Allergen Reactions  . Penicillins Rash    Has patient had a PCN reaction causing immediate rash, facial/tongue/throat swelling, SOB or lightheadedness with hypotension: No Has patient had a PCN reaction causing severe rash involving mucus membranes or skin necrosis: No Has patient had a PCN reaction that required hospitalization: No Has patient had a PCN reaction occurring within the last 10 years: No If all of the above answers are "NO", then may proceed with Cephalosporin use.     Family History Family History  Problem Relation Age of Onset  . CVA Mother   . CAD Mother   . CAD Father    Social History  reports that he has been smoking. He has never used smokeless tobacco. He reports current alcohol use. He reports that he does not use drugs.  Review Of Systems:   Constitutional: Negative for fever and chills.  HENT: Negative for congestion and rhinorrhea.  Eyes: Negative for redness and visual disturbance.  Respiratory: Negative for shortness of breath and wheezing.  Cardiovascular: Negative for chest pain but  reports palpitations.  Gastrointestinal: Negative  for nausea , vomiting and abdominal pain and  Loose stools Genitourinary: Negative for dysuria and urgency.  Endocrine: Denies polyuria, polyphagia and heat intolerance Musculoskeletal: Reports right knee pain Skin: Negative for pallor and wound.  Neurological: Negative for dizziness and headaches   VITAL SIGNS: BP 108/77 (BP Location: Left Arm)   Pulse (!) 107   Temp 98.6 F (37 C) (Oral)   Resp (!) 22   Ht  (1.854 m)   Wt 93.6 kg   SpO2 95%   BMI 27.22 kg/m   HEMODYNAMICS:    VENTILATOR SETTINGS:    INTAKE / OUTPUT: I/O last 3 completed shifts: In: 251.4 [IV Piggyback:251.4] Out: 2775 [Urine:2775]  PHYSICAL EXAMINATION: General: No acute distress HEENT: PERRLA, trachea midline, no JVD Neuro: Alert and oriented x3, no focal deficits Cardiovascular: Apical pulse irregular, tachycardic, S1-S2, no murmur regurg or gallop, +2 pulses bilaterally, no edema Lungs: Clear to auscultation bilaterally, diminished in the bases Abdomen: Non-distended, normal bowel sounds in all 4 quadrants, palpation reveals no organomegaly Musculoskeletal:  Right knee immobilized, swollen, dressing in place Skin: Warm and dry   LABS:  BMET Recent Labs  Lab 09/01/18 0510 09/02/18 0534 09/02/18 1849  NA 134* 135 133*  K 4.3 4.0 3.9  CL 98 98 97*  CO2 28 27 28   BUN 21* 23* 21*  CREATININE 0.99 0.93 1.05  GLUCOSE 122* 138* 110*    Electrolytes Recent Labs  Lab 08/27/18 0504  08/29/18 0049 08/30/18 0507  09/01/18 0510 09/02/18 0534 09/02/18 1849  CALCIUM 7.1*   < > 7.2* 7.6*   < > 8.0* 8.1* 8.0*  MG 1.5*   < > 1.9 1.8  --   --   --  1.3*  PHOS 5.1*  --   --   --   --   --   --   --    < > = values in this interval not displayed.    CBC Recent Labs  Lab 08/31/18 0414 09/01/18 0510 09/02/18 0534  WBC 21.7* 22.8* 23.5*  HGB 8.5* 8.3* 8.4*  HCT 25.9* 25.7* 26.4*  PLT 202 192 235    Coag's Recent Labs  Lab  08/27/18 0759  APTT 41*  INR 1.4*    Sepsis Markers Recent Labs  Lab 08/27/18 1404 08/28/18 1351 08/29/18 0047  LATICACIDVEN 2.5* 2.5* 2.0*    ABG No results for input(s): PHART, PCO2ART, PO2ART in the last 168 hours.  Liver Enzymes Recent Labs  Lab 08/27/18 0504 09/01/18 0510  AST 23 41  ALT 13 29  ALKPHOS 160* 243*  BILITOT 5.0* 2.0*  ALBUMIN 1.7* 1.9*    Cardiac Enzymes No results for input(s): TROPONINI, PROBNP in the last 168 hours.  Glucose Recent Labs  Lab 09/02/18 1830  GLUCAP 110*    Imaging No results found.   STUDIES:  None  CULTURES: Deep wound cultures pending  ANTIBIOTICS: Vancomycin  SIGNIFICANT EVENTS: 08/26/2018: Admitted 09/02/2018: Transferred to the ICU  LINES/TUBES: Peripheral IVs  DISCUSSION: 58 year old male status post right knee arthroscopy with Synovectomy now presenting with A. fib with RVR secondary to volume depletion and electrolyte imbalances  ASSESSMENT  A. fib with RVR Right septic knee status post arthroscopy with Synovectomy MRSA septic knee and MRSA bacteremia Hypomagnesemia Acute blood loss anemia versus anemia of chronic disease  PLAN Hemodynamic monitoring per ICU protocol Continue diltiazem infusion and transition to p.o. diltiazem once heart rate is less than 100 bpm Magnesium 4 g IV x1 Banana bag x1 and relay with lactated Ringer's with D5 at 75 cc an hour Monitor and correct electrolyte imbalances Monitor I's and O's Rest of the treatment plan unchanged  Best Practice: Code Status: Full code Diet: Heart healthy diet GI prophylaxis: Not indicated VTE prophylaxis: SCDs and subcu Lovenox  FAMILY  - Updates: No family at bedside.  Patient updated on current treatment plan [facility] ANP-BC Pulmonary and Critical Care Medicine [facility] Pager [phone] or [phone]  NB: This document was prepared using Dragon voice recognition software and may include unintentional  dictation errors.    09/03/2018, 4:49 AM

## 2018-09-04 ENCOUNTER — Inpatient Hospital Stay: Payer: Medicare Other | Admitting: Hematology and Oncology

## 2018-09-04 ENCOUNTER — Inpatient Hospital Stay: Payer: Self-pay

## 2018-09-04 DIAGNOSIS — E440 Moderate protein-calorie malnutrition: Secondary | ICD-10-CM

## 2018-09-04 DIAGNOSIS — I4891 Unspecified atrial fibrillation: Secondary | ICD-10-CM

## 2018-09-04 LAB — BASIC METABOLIC PANEL
Anion gap: 10 (ref 5–15)
BUN: 18 mg/dL (ref 6–20)
CO2: 26 mmol/L (ref 22–32)
Calcium: 8.4 mg/dL — ABNORMAL LOW (ref 8.9–10.3)
Chloride: 96 mmol/L — ABNORMAL LOW (ref 98–111)
Creatinine, Ser: 1.09 mg/dL (ref 0.61–1.24)
GFR calc Af Amer: >60 mL/min (ref >60)
GFR calc non Af Amer: >60 mL/min (ref >60)
Glucose, Bld: 89 mg/dL (ref 70–99)
Potassium: 4.5 mmol/L (ref 3.5–5.1)
SODIUM: 132 mmol/L — AB (ref 135–145)

## 2018-09-04 LAB — CBC
HCT: 30 % — ABNORMAL LOW (ref 39.0–52.0)
Hemoglobin: 9.7 g/dL — ABNORMAL LOW (ref 13.0–17.0)
MCH: 32.2 pg (ref 26.0–34.0)
MCHC: 32.3 g/dL (ref 30.0–36.0)
MCV: 99.7 fL (ref 80.0–100.0)
Platelets: 287 10*3/uL (ref 150–400)
RBC: 3.01 MIL/uL — ABNORMAL LOW (ref 4.22–5.81)
RDW: 20.7 % — ABNORMAL HIGH (ref 11.5–15.5)
WBC: 24.9 10*3/uL — ABNORMAL HIGH (ref 4.0–10.5)
nRBC: 0 % (ref 0.0–0.2)

## 2018-09-04 LAB — MAGNESIUM: Magnesium: 1.8 mg/dL (ref 1.7–2.4)

## 2018-09-04 MED ORDER — SODIUM CHLORIDE 0.9% FLUSH: 10.0000 mL | INTRAVENOUS | Status: DC | PRN

## 2018-09-04 MED ORDER — ENSURE ENLIVE PO LIQD
237.0000 mL | Freq: Two times a day (BID) | ORAL | Status: DC
  Administered 2018-09-04 – 2018-09-06 (×2): 237 mL via ORAL

## 2018-09-04 MED ORDER — AMIODARONE HCL 200 MG PO TABS
400.0000 mg | ORAL_TABLET | Freq: Two times a day (BID) | ORAL | Status: DC
  Administered 2018-09-04 – 2018-09-08 (×9): 400 mg via ORAL
  Filled 2018-09-04 (×9): qty 2

## 2018-09-04 MED ORDER — SODIUM CHLORIDE 0.9 % IV SOLN
INTRAVENOUS | Status: DC | PRN
  Administered 2018-09-04 – 2018-09-06 (×2): 500 mL via INTRAVENOUS

## 2018-09-04 MED ORDER — DIPHENHYDRAMINE HCL 25 MG PO CAPS
25.0000 mg | ORAL_CAPSULE | Freq: Four times a day (QID) | ORAL | Status: DC | PRN
  Administered 2018-09-04: 25 mg via ORAL
  Filled 2018-09-04: qty 1

## 2018-09-04 MED ORDER — SODIUM CHLORIDE 0.9% FLUSH
10.0000 mL | Freq: Two times a day (BID) | INTRAVENOUS | Status: DC
  Administered 2018-09-04 – 2018-09-08 (×9): 10 mL

## 2018-09-04 NOTE — Care Management Note (Signed)
Case Management Note  Patient Details  Name: Larry Andrade. MRN: [id number] Date of Birth: September 08, 1960  Subjective/Objective:    Plan for full wash out of right knee tomorrow.  Patient would like to DC to [facility] when medically stable.  Will need updated PT evaluation after procedure tomorrow.                  Action/Plan:   Expected Discharge Date:                  Expected Discharge Plan:  Skilled Nursing Facility  In-House Referral:     Discharge planning Services  CM Consult  Post Acute Care Choice:    Choice offered to:     DME Arranged:    DME Agency:     HH Arranged:    HH Agency:     Status of Service:  Completed, signed off  If discussed at [employer] of Stay Meetings, dates discussed:    Additional Comments:  Sherren Kerns, RN 09/04/2018, 3:34 PM

## 2018-09-04 NOTE — Progress Notes (Signed)
  Subjective:  Aware of the events over the weekend.  Patient being treated for atrial fibrillation.  Dr. Hyacinth Meeker drained approximately 300 cc of purulent fluid yesterday.  Patient reports pain as moderate.  His wife is at the bedside.  Objective:   VITALS:   Vitals:   09/03/18 1800 09/03/18 1935 09/04/18 0520 09/04/18 0730  BP: 112/77 116/75 138/89 123/76  Pulse: (!) 107  (!) 110 (!) 101  Resp: (!) 22  18   Temp:  99 F (37.2 C) 99.8 F (37.7 C) 99 F (37.2 C)  TempSrc:  Oral Oral Oral  SpO2: 95% 96% 95% 97%  Weight:   92.8 kg   Height:   6\' 1"  (1.854 m)     PHYSICAL EXAM: Right lower extremity: Bandage is clean and dry.  Patient has diminished sensation to light touch in the right foot which is his baseline due to neuropathy.  His foot is well-perfused.  There is intact motor function.  Right knee is still swollen.  LABS  Results for orders placed or performed during the hospital encounter of 08/26/18 (from the past 24 hour(s))  CBC     Status: Abnormal   Collection Time: 09/04/18  8:40 AM  Result Value Ref Range   WBC 24.9 (H) 4.0 - 10.5 K/uL   RBC 3.01 (L) 4.22 - 5.81 MIL/uL   Hemoglobin 9.7 (L) 13.0 - 17.0 g/dL   HCT 42.3 (L) 95.3 - 20.2 %   MCV 99.7 80.0 - 100.0 fL   MCH 32.2 26.0 - 34.0 pg   MCHC 32.3 30.0 - 36.0 g/dL   RDW 33.4 (H) 35.6 - 86.1 %   Platelets 287 150 - 400 K/uL   nRBC 0.0 0.0 - 0.2 %  Basic metabolic panel     Status: Abnormal   Collection Time: 09/04/18  8:40 AM  Result Value Ref Range   Sodium 132 (L) 135 - 145 mmol/L   Potassium 4.5 3.5 - 5.1 mmol/L   Chloride 96 (L) 98 - 111 mmol/L   CO2 26 22 - 32 mmol/L   Glucose, Bld 89 70 - 99 mg/dL   BUN 18 6 - 20 mg/dL   Creatinine, Ser 6.83 0.61 - 1.24 mg/dL   Calcium 8.4 (L) 8.9 - 10.3 mg/dL   GFR calc non Af Amer >60 >60 mL/min   GFR calc Af Amer >60 >60 mL/min   Anion gap 10 5 - 15  Magnesium     Status: None   Collection Time: 09/04/18  8:40 AM  Result Value Ref Range   Magnesium 1.8 1.7  - 2.4 mg/dL    [place] [employer]  Result Date: 09/04/2018 If Site Rite image not attached, placement could not be confirmed due to current cardiac rhythm.   Assessment/Plan: 9 Days Post-Op   Active Problems:   Sepsis (HCC)  Recommending a formal open washout of the right knee tomorrow.  I have left the patient's drain in today.  I will remove it tomorrow during the procedure.  Patient will be n.p.o. after midnight.  Dr. Lady Gary is indicated the patient is cleared for surgery.  I explained the procedure to the patient and he is in agreement with the plan.    Juanell Fairly , MD 09/04/2018, 12:33 PM

## 2018-09-04 NOTE — Care Management Important Message (Signed)
Important Message  Patient Details  Name: Larry Andrade. MRN: [id number] Date of Birth: January 04, 1961   Medicare Important Message Given:  Yes    Sherren Kerns, RN 09/04/2018, 1:20 PM

## 2018-09-04 NOTE — Plan of Care (Signed)
  Problem: Pain Managment: Goal: General experience of comfort will improve Outcome: Progressing Note:  Patient for a R knee "washout" tomorrow per orthopedic surgery. Diet order already entered. Patient currently receiving PICC line. Will continue to monitor overall progress. Larry Andrade [facility]

## 2018-09-04 NOTE — Progress Notes (Signed)
Patient Name: Larry Andrade. Date of Encounter: 09/04/2018  Hospital Problem List     Active Problems:   Sepsis [facility])    Patient Profile     58 year old male with septic right knee now with atrial fibrillation.  Rate improved with IV amiodarone.  Will convert to p.o.  Subjective   Complains of right knee pain  Inpatient Medications    . sodium chloride   Intravenous Once  . allopurinol  100 mg Oral Daily  . amiodarone  400 mg Oral BID  . colchicine  0.6 mg Oral Daily  . docusate sodium  100 mg Oral BID  . enoxaparin (LOVENOX) injection  40 mg Subcutaneous Q24H  . folic acid  1 mg Oral Daily  . furosemide  20 mg Oral BID  . lidocaine (PF)  30 mL Infiltration Once  . lidocaine (PF)  60 mL Infiltration Once  . metoprolol tartrate  25 mg Oral BID  . multivitamin with minerals  1 tablet Oral Daily  . pantoprazole  40 mg Oral Daily  . sodium chloride flush  3 mL Intravenous Once  . tamsulosin  0.4 mg Oral Daily  . thiamine  100 mg Oral Daily   Or  . thiamine  100 mg Intravenous Daily  . vancomycin variable dose per unstable renal function (pharmacist dosing)   Does not apply See admin instructions  . cyanocobalamin  1,000 mcg Oral Daily    Vital Signs    Vitals:   09/03/18 1800 09/03/18 1935 09/04/18 0520 09/04/18 0730  BP: 112/77 116/75 138/89 123/76  Pulse: (!) 107  (!) 110 (!) 101  Resp: (!) 22  18   Temp:  99 F (37.2 C) 99.8 F (37.7 C) 99 F (37.2 C)  TempSrc:  Oral Oral Oral  SpO2: 95% 96% 95% 97%  Weight:   92.8 kg   Height:   6\' 1"  (1.854 m)     Intake/Output Summary (Last 24 hours) at 09/04/2018 4128 Last data filed at 09/04/2018 0700 Gross per 24 hour  Intake -  Output 1350 ml  Net -1350 ml   Filed Weights   08/26/18 1448 09/02/18 1833 09/04/18 0520  Weight: 90.2 kg 93.6 kg 92.8 kg    Physical Exam    GEN: Well nourished, well developed, in no acute distress.  HEENT: normal.  Neck: Supple, no JVD, carotid bruits, or masses. Cardiac: RRR,  no murmurs, rubs, or gallops. No clubbing, cyanosis, edema.  Radials/DP/PT 2+ and equal bilaterally.  Respiratory:  Respirations regular and unlabored, clear to auscultation bilaterally. GI: Soft, nontender, nondistended, BS + x 4. MS: no deformity or atrophy. Skin: warm and dry, no rash. Neuro:  Strength and sensation are intact. Psych: Normal affect.  Labs    CBC Recent Labs    09/02/18 0534 09/03/18 0552  WBC 23.5* 23.0*  HGB 8.4* 8.7*  HCT 26.4* 27.2*  MCV 100.8* 101.5*  PLT 235 235   Basic Metabolic Panel Recent Labs    78/67/67 1849 09/03/18 0552  NA 133* 131*  K 3.9 3.9  CL 97* 96*  CO2 28 28  GLUCOSE 110* 84  BUN 21* 19  CREATININE 1.05 1.00  CALCIUM 8.0* 8.0*  MG 1.3* 2.1  PHOS  --  3.2   Liver Function Tests No results for input(s): AST, ALT, ALKPHOS, BILITOT, PROT, ALBUMIN in the last 72 hours. No results for input(s): LIPASE, AMYLASE in the last 72 hours. Cardiac Enzymes No results for input(s): CKTOTAL, CKMB, CKMBINDEX, TROPONINI  in the last 72 hours. BNP No results for input(s): BNP in the last 72 hours. D-Dimer No results for input(s): DDIMER in the last 72 hours. Hemoglobin A1C No results for input(s): HGBA1C in the last 72 hours. Fasting Lipid Panel No results for input(s): CHOL, HDL, LDLCALC, TRIG, CHOLHDL, LDLDIRECT in the last 72 hours. Thyroid Function Tests No results for input(s): TSH, T4TOTAL, T3FREE, THYROIDAB in the last 72 hours.  Invalid input(s): FREET3  Telemetry    Atrial fibrillation with variable ventricular response  ECG    Atrial fibrillation  Radiology    Dg Chest 2 View  Result Date: 08/16/2018 CLINICAL DATA:  Shortness of breath and congestion for 2 weeks. History of CHF. EXAM: CHEST - 2 VIEW COMPARISON:  None. FINDINGS: Cardiac silhouette is mildly enlarged. Calcified coronary artery versus stent. Calcified aortic arch. LEFT lung base scarring. No pleural effusion or focal consolidation. No pneumothorax. Old  RIGHT rib fractures. Osteopenia. IMPRESSION: Mild cardiomegaly.  No acute pulmonary process. Electronically Signed   By: Awilda Metro M.D.   On: 08/16/2018 17:06   Dg Knee 1-2 Views Right  Result Date: 08/26/2018 CLINICAL DATA:  Knee pain.  No known injury.  Initial encounter. EXAM: RIGHT KNEE - 1-2 VIEW COMPARISON:  None. FINDINGS: A moderate to large knee effusion is noted. Tricompartmental degenerative changes are present. No acute fracture or dislocation. IMPRESSION: Moderate to large knee effusion without acute bony abnormality. Tricompartment degenerative changes. Electronically Signed   By: Harmon Pier M.D.   On: 08/26/2018 18:50   Ct Tibia Fibula Right W Contrast  Result Date: 08/26/2018 CLINICAL DATA:  Right lower extremity pain and swelling. EXAM: CT OF THE LOWER RIGHT EXTREMITY WITH CONTRAST TECHNIQUE: Multidetector CT imaging of the lower right extremity was performed according to the standard protocol following intravenous contrast administration. COMPARISON:  Radiographs 08/26/2018 CONTRAST:  OMNIPAQUE IOHEXOL 300 MG/ML  SOLN FINDINGS: There is a very large and complex right knee joint effusion with enhancing synovium and synovial calcifications. There is also some air in the joint but this may be related to recent prior joint aspiration. There is also an erosive process involving the tibiofibular joint with a large erosion involving the fibular head and nearby adjacent tibia. 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 Similar findings at the ankle joint with a large complex joint effusion, synovial enhancement and nodular calcified tissue. There also scattered erosions involving the tibia, fibula and talus. Extensive erosive changes are also noted involving the cuneiforms. The anterior ankle tendons demonstrate thickening, nodularity and calcification which is also likely gout related. There is diffuse and fairly marked subcutaneous soft  tissue swelling/edema/fluid involving the entire right lower extremity which would suggest cellulitis. No discrete rim enhancing drainable fluid collection to suggest an abscess. No findings for myofasciitis or pyomyositis. IMPRESSION: 1. Large complex joint effusions involving the knee joint and ankle joint as detailed above. I think the findings are most likely due to gout. Extensive tophaceous changes. Recommend correlation with recent joint aspirations. Could not totally exclude the possibility of superimposed septic arthritis. 2. Erosive changes and soft tissue tophi surrounding the tibiofibular joint proximally. 3. Advanced erosive changes involving the proximal and midfoot bony structures. 4. Tophaceous changes involving the anterior ankle tendons. 5. Diffuse subcutaneous soft tissue swelling/edema/fluid suggesting cellulitis. No definite findings for myofasciitis or pyomyositis. Electronically Signed   By: Rudie Meyer M.D.   On: 08/26/2018 19:23   US Venous Img Lower Unilateral Right  Result  Date: 08/27/2018 CLINICAL DATA:  Right lower extremity swelling and pain. EXAM: RIGHT LOWER EXTREMITY VENOUS DOPPLER ULTRASOUND TECHNIQUE: Gray-scale sonography with graded compression, as well as color Doppler and duplex ultrasound were performed to evaluate the lower extremity deep venous systems from the level of the common femoral vein and including the common femoral, femoral, profunda femoral, popliteal and calf veins including the posterior tibial, peroneal and gastrocnemius veins when visible. The superficial great saphenous vein was also interrogated. Spectral Doppler was utilized to evaluate flow at rest and with distal augmentation maneuvers in the common femoral, femoral and popliteal veins. COMPARISON:  None. FINDINGS: Contralateral Common Femoral Vein: Respiratory phasicity is normal and symmetric with the symptomatic side. No evidence of thrombus. Normal compressibility. Common Femoral Vein: No  evidence of thrombus. Normal compressibility, respiratory phasicity and response to augmentation. Saphenofemoral Junction: No evidence of thrombus. Normal compressibility and flow on color Doppler imaging. Profunda Femoral Vein: No evidence of thrombus. Normal compressibility and flow on color Doppler imaging. Femoral Vein: No evidence of thrombus. Normal compressibility, respiratory phasicity and response to augmentation. Popliteal Vein: No evidence of thrombus. Normal compressibility, respiratory phasicity and response to augmentation. Calf Veins: No evidence of thrombus. Normal compressibility and flow on color Doppler imaging. Superficial Great Saphenous Vein: No evidence of thrombus. Normal compressibility. Venous Reflux:  None. Other Findings:  None. IMPRESSION: No evidence of deep venous thrombosis. Electronically Signed   By: Myles Rosenthal M.D.   On: 08/27/2018 09:36   Dg Chest Port 1 View  Result Date: 08/26/2018 CLINICAL DATA:  Sepsis EXAM: PORTABLE CHEST 1 VIEW COMPARISON:  08/16/2018 chest radiograph FINDINGS: The cardiomediastinal silhouette is unremarkable. There is no evidence of focal airspace disease, pulmonary edema, suspicious pulmonary nodule/mass, pleural effusion, or pneumothorax. No acute bony abnormalities are identified. Remote RIGHT rib fractures. IMPRESSION: No active disease. Electronically Signed   By: Harmon Pier M.D.   On: 08/26/2018 16:41   [place] Ascites (abdomen Limited)  Result Date: 08/18/2018 CLINICAL DATA:  Cirrhosis. EXAM: LIMITED ABDOMEN ULTRASOUND FOR ASCITES TECHNIQUE: Limited ultrasound survey for ascites was performed in all four abdominal quadrants. COMPARISON:  None. FINDINGS: Survey of the abdominal cavity demonstrates no visible ascites. IMPRESSION: No ascites identified in the peritoneal cavity by ultrasound. Electronically Signed   By: Irish Lack M.D.   On: 08/18/2018 16:40    Assessment & Plan    58 year old male admitted with septic right knee with  bacteremia.  Now with atrial fibrillation with rapid ventricular response.  A. fib-rate is improved with IV amiodarone.  Will convert to p.o. amiodarone.  We will continue oral load at 400 mg twice daily.  At discharge patient should only be on 400 mg twice daily for 1 week followed by 200 mg twice daily for 3 weeks and then 200 mg daily.  Will defer chronic anticoagulation recommendations for now pending further orthopedic work-up  Septic right knee-continue with IV antibiotics.  Signed, Darlin Priestly Bryan Omura MD 09/04/2018, 8:22 AM  Pager: [phone]

## 2018-09-04 NOTE — Progress Notes (Signed)
[employer] - [place] at [facility]   PATIENT NAME: Larry Andrade    MR#:  [id number]  DATE OF BIRTH:  09-29-1960  SUBJECTIVE:  CHIEF COMPLAINT:   Chief Complaint  Patient presents with  . Knee Pain   No new complaints this morning.  No fevers.  Patient had to be transferred to ICU on 09/02/2018 due to atrial fibrillation with rapid ventricular response.  Heart rate was not controlled with Cardizem drip as such patient was started on amiodarone drip which is being transitioned to p.o. amiodarone.  Patient already transferred out of ICU.  Had bedside I&D with about 300 cc of purulent material drained out by orthopedic physician at bedside on 09/03/2018. Scheduled for washout in the OR by orthopedic physician tomorrow.  REVIEW OF SYSTEMS:  Review of Systems  Constitutional: Negative for chills and fever.  HENT: Negative for hearing loss and tinnitus.   Eyes: Negative for blurred vision and double vision.  Respiratory: Negative for cough, sputum production and shortness of breath.   Cardiovascular: Negative for chest pain, palpitations and orthopnea.  Gastrointestinal: Negative for heartburn, nausea and vomiting.  Genitourinary: Negative for dysuria and urgency.  Musculoskeletal: Negative for myalgias and neck pain.       Gradual improvement in right knee swelling  Skin: Negative for itching and rash.  Neurological: Negative for dizziness and headaches.  Psychiatric/Behavioral: Negative for depression and hallucinations.    DRUG ALLERGIES:   Allergies  Allergen Reactions  . Penicillins Rash    Has patient had a PCN reaction causing immediate rash, facial/tongue/throat swelling, SOB or lightheadedness with hypotension: No Has patient had a PCN reaction causing severe rash involving mucus membranes or skin necrosis: No Has patient had a PCN reaction that required hospitalization: No Has patient had a PCN reaction occurring within the last 10 years: No If all of  the above answers are "NO", then may proceed with Cephalosporin use.    VITALS:  Blood pressure 123/76, pulse (!) 101, temperature 99 F (37.2 C), temperature source Oral, resp. rate 18, height 6\' 1"  (1.854 m), weight 92.8 kg, SpO2 97 %. PHYSICAL EXAMINATION:   Physical Exam  Constitutional: He is oriented to person, place, and time. He appears well-developed and well-nourished.  HENT:  Head: Normocephalic and atraumatic.  Right Ear: External ear normal.  Mouth/Throat: Oropharynx is clear and moist. No oropharyngeal exudate.  Eyes: Pupils are equal, round, and reactive to light. Conjunctivae and EOM are normal. Right eye exhibits no discharge.  Neck: Normal range of motion. No tracheal deviation present.  Cardiovascular: Normal rate, regular rhythm and normal heart sounds.  Respiratory: Effort normal and breath sounds normal. No stridor. He has no wheezes.  GI: Soft. Bowel sounds are normal. There is no abdominal tenderness.  Musculoskeletal:        General: Edema present. No tenderness.     Comments: Dressing in place over right knee joint.  Neurological: He is alert and oriented to person, place, and time. No cranial nerve deficit.  Skin: Skin is warm. No erythema.  Psychiatric: He has a normal mood and affect. His behavior is normal.     LABORATORY PANEL:  Male CBC Recent Labs  Lab 09/04/18 0840  WBC 24.9*  HGB 9.7*  HCT 30.0*  PLT 287   ------------------------------------------------------------------------------------------------------------------ Chemistries  Recent Labs  Lab 09/01/18 0510  09/04/18 0840  NA 134*   < > 132*  K 4.3   < > 4.5  CL 98   < >  96*  CO2 28   < > 26  GLUCOSE 122*   < > 89  BUN 21*   < > 18  CREATININE 0.99   < > 1.09  CALCIUM 8.0*   < > 8.4*  MG  --    < > 1.8  AST 41  --   --   ALT 29  --   --   ALKPHOS 243*  --   --   BILITOT 2.0*  --   --    < > = values in this interval not displayed.   RADIOLOGY:  [place] Ekg Site  Rite  Result Date: 09/04/2018 If Site Rite image not attached, placement could not be confirmed due to current cardiac rhythm.  ASSESSMENT AND PLAN:   58 year old male with EtOH liver cirrhosis, chronic diastolic heart failure with preserved ejection fraction, ongoing tobacco abuse who was discharged from the hospital on2/24/2020with exacerbation of CHF and gout flare,who thenpresentedto the emergency room with right knee swelling and pain.   Plan: 1. Sepsis from right knee septic joint, resolving:Patient presentedwith tachycardia, tachypnea and leukocytosis Gram stain showing gram-positive cocci, blood culture growing MRSA, synovial fluid cultureS Aureus.  Patient still has leukocytosis with white count of 24,000. Seen by infectious disease specialist.  Recommendation is to continue vancomycin for 4-6 weeks IV per ID/Dr. Rivka Safer.  S/p R knee arthroscopic I&D withDr. Martha Clan.  Patient had about 120 cc aspirated on 09/01/2018 yesterday fom the right knee.  Orthopedic surgery has rescheduled washout of right knee joint for tomorrow to allow for better control of heart rate and stabilization of cardiac issues prior to surgery. No evidence of DVT on venous Doppler ultrasound recently.   Lactic acid2.64from 2.20most recently. DiscontinuedIV fluidsand encouragedoral intake.  Had bedside I&D with about 300 cc of purulent material drained out by orthopedic physician at bedside on 09/03/2018. Scheduled for washout in the OR by orthopedic physician tomorrow. Seen by PT recommended SNF.Continue towork with PT as tolerated.Reassess for needs as activity tolerance increases with pain control. 09/01/2018 TEE not performed. See note Dr. Lady Gary. Will require 4-6 weeks IV abx regardless. PICC line placed today   2. Chronic diastolic heart failure with preserved ejection fraction:No signs of exacerbation at this time. Continue to monitor. ContinuehomeLasix.  3. EtOH abuse with liver  cirrhosis: Patient reports his last drink was Wednesday prior to admission CIWA protocoldiscontinued. No agitationx 6 days No ascites on recent abdominal ultrasound.  4.Hypomagnesemia, hypocalcemia:Repleted,   5.Gout:Continue allopurinol and colchicine. Monitor.  Improving  6. Essential hypertension:Continue metoprolol  7. Anemia Hbgstable 8.7today. Macrocytic. Chronic EtOH abuse. 1 u pRBCsgiven prior. Recheck AM labs. Added on folate and B12 check,within normal limits.  8.Tobacco dependence:Patient is encouraged to quit smoking. Counseling was providedand will be a discharge.  9. HMC:NOBSJGGE tamsulosin   10. CoughPRNs and breathing treatmentsordered. Monitor clinically. Reassess for acute cardiopulmonary processes/CHF exacerbation as needed.  Given his smoking history (40 pack-year history) and the duration of the cough (months) suspect he has underlyingchroniclung disease,question COPD. Did have a CXR recently, some scarring at the lung base only.Patient should be encouraged tofollow-up outpatient for PFTs withaPCP.  11.  Atrial fibrillation with rapid ventricular response. Heart rate in the 160s.  Patient denies any chest pain.  No shortness of breath. Currently no evidence of alcohol withdrawal clinically. Patient was transferred to the ICU on 09/02/2018 and heart rate remained uncontrolled with Cardizem drip.  Was subsequently started on amiodarone drip.  Patient already transitioned to p.o. amiodarone  At  discharge patient should only be on 400 mg twice daily for 1 week followed by 200 mg twice daily for 3 weeks and then 200 mg daily.  Will defer chronic anticoagulation recommendations for now pending further orthopedic work-up  DVT prophylaxis; Lovenox   All the records are reviewed and case discussed with Care Management/Social Worker. Management plans discussed with the patient, family and they are in agreement.  CODE STATUS: Full  Code  TOTAL TIME TAKING CARE OF THIS PATIENT: 36 minutes.   More than 50% of the time was spent in counseling/coordination of care: YES  POSSIBLE D/C IN 2 DAYS, DEPENDING ON CLINICAL CONDITION.   Delorean Knutzen M.D on 09/04/2018 at 2:19 PM  Between 7am to 6pm - Pager - [phone]  After 6pm go to [email or web address] - Scientist, research (life sciences) [place] [facility]  Office  [phone]  CC: Primary care physician; Gildardo Pounds, PA  Note: This dictation was prepared with Dragon dictation along with smaller phrase technology. Any transcriptional errors that result from this process are unintentional.

## 2018-09-04 NOTE — Progress Notes (Signed)
PT Cancellation Note  Patient Details Name: Larry Andrade. MRN: [id number] DOB: 03/01/1961   Cancelled Treatment:    Reason Eval/Treat Not Completed: Other (comment).  Chart reviewed.  Pt noted to have transferred to CCU 09/02/18 d/t a-fib with RVR (pt now transferred to telemetry floor; per ortho note pt s/p I&D of R knee at bedside 09/03/18 with drain left in wound).  No PT continue at transfer orders received when pt transferred to CCU so will discontinue current PT order per PT guidelines d/t change in medical status.  Please re-consult PT when pt is medically appropriate to participate in therapy (per ortho note recommendation for formal open washout of R knee tomorrow).  Discussed with CM.  Hendricks Limes, PT 09/04/18, 3:27 PM [phone]

## 2018-09-04 NOTE — Progress Notes (Signed)
Initial Nutrition Assessment  DOCUMENTATION CODES:   Non-severe (moderate) malnutrition in context of chronic illness  INTERVENTION:   - Continue MVI with minerals daily  - Ensure Enlive po BID, each supplement provides 350 kcal and 20 grams of protein  - Snacks TID between meals, RD ordered via HealthTouch diet software  NUTRITION DIAGNOSIS:   Moderate Malnutrition related to chronic illness (cirrhosis, CHF) as evidenced by moderate fat depletion, severe fat depletion, moderate muscle depletion, severe muscle depletion.  GOAL:   Patient will meet greater than or equal to 90% of their needs  MONITOR:   PO intake, Supplement acceptance, I & O's, Labs, Weight trends  REASON FOR ASSESSMENT:   Malnutrition Screening Tool    ASSESSMENT:   58 year old male who presented to the ED on 2/29 with knee pain. PMH of CHF, cirrhosis due to EtOH, GERD, gout, HTN. Pt admitted with sepsis from right knee septic joint. Pt found to have MRSA bacteremia.  2/29 - s/p arthroscopic I&D of right knee 3/6 - bedside aspiration with removal of 120 cc thick creamy fluid 3/7 - transfer to ICU for atrial fibrillation with RVR 3/8 - transfer back to floor, bedside I&D of right knee joint with removal of 300 cc brown creamy pus  Spoke with pt at bedside. Pt asleep at time of visit but awakened to RD touch and voice. Pt reports that his appetite is good and that he consumed 100% of his breakfast meal this morning which included sausage and egg sandwich and oatmeal.  Pt states that his appetite is normally good but that he "can't eat much at one time" due to getting full easily. Pt reports that he had his colon removed 5 years ago.  Surgical history in chart lists "colon surgery" with no date. Pt reports that since this time, he has had to consume smaller and more frequent "meals." Pt reports that when he eats, it is not a "full meal" and might include a sandwich.  Pt endorses weight loss which he states  began 5 years ago after his colon surgery. Pt reports that his weight prior to weight loss was 287 lbs. Pt reveals that he has experienced over 80 lb weight loss. Weight history in chart is limited as available weights only date back to 02/22/17. Available weights indicate that pt's weight has slowly trended upward since that time.  Pt requesting to be moved up in bed. Alerted NT who assisted in repositioning pt.  Pt amenable to receiving snacks between meals and Ensure Enlive oral nutrition supplements to aid in meeting kcal and protein needs.  Noted plan for pt to d/c to SNF for rehab and due to need for continued IV antibiotics.  Meal Completion: 100% x 1 meal recorded  Medications reviewed and include: Colace, folic acid, Lasix, MVI with minerals, Protonix, thiamine, vitamin B-12, IV antibiotics  Labs reviewed: sodium 132 (L), chloride 96 (L), hemoglobin 9.7 (L) K+, mag, phos all WNL  UOP: 2050 ml x 24 hours I/O's: -13.2 L since admit per flowsheet  NUTRITION - FOCUSED PHYSICAL EXAM:    Most Recent Value  Orbital Region  Moderate depletion  Upper Arm Region  Severe depletion  Thoracic and Lumbar Region  Moderate depletion  Buccal Region  Moderate depletion  Temple Region  Moderate depletion  Clavicle Bone Region  Moderate depletion  Clavicle and Acromion Bone Region  Severe depletion  Scapular Bone Region  Moderate depletion  Dorsal Hand  Moderate depletion  Patellar Region  Moderate depletion  Anterior Thigh Region  Moderate depletion  Posterior Calf Region  Moderate depletion  Edema (RD Assessment)  Moderate [BLE]  Hair  Reviewed  Eyes  Reviewed  Mouth  Reviewed  Skin  Reviewed [jaundiced in appearance]  Nails  Reviewed       Diet Order:   Diet Order            Diet regular Room service appropriate? Yes; Fluid consistency: Thin  Diet effective now              EDUCATION NEEDS:   No education needs have been identified at this time  Skin:  Skin Assessment:  Reviewed RN Assessment  Last BM:  09/04/18 large type 5  Height:   Ht Readings from Last 1 Encounters:  09/04/18 6\' 1"  (1.854 m)    Weight:   Wt Readings from Last 1 Encounters:  09/04/18 92.8 kg    Ideal Body Weight:  83.6 kg  BMI:  Body mass index is 26.98 kg/m.  Estimated Nutritional Needs:   Kcal:  2200-2400  Protein:  110-125 grams  Fluid:  >/= 2.2 L    Earma Reading, MS, RD, LDN Inpatient Clinical Dietitian Pager: [phone] Weekend/After Hours: [phone]

## 2018-09-04 NOTE — Progress Notes (Deleted)
[facility]-  Cancer Center  Clinic day:  09/04/2018  Larry Complaint: Larry Andrade. is a 58 y.o. male with macrocytic anemia who is seen in follow-up after interval hospitalization.  HPI: The patient was admitted to [facility] from 08/17/2018 - 08/21/2018 with a CHF exacerbation.  While hospitalized, I was consulted secondary to macrocytic anemia.  He was unaware of a history of cirrhosis or CHF.  He drinks liquor 1-2 drinks/day, sometimes more.  He denies any history of hepatitis.  He describes a transfusion several years ago in [place].  He states that part of his colon was removed about 5-6 years ago secondary to a mass.  He is unaware of any follow-up colonoscopies.  He does not a history of diverticulosis.  He denies any melena, hematochezia or hematuria.    He was unaware of any issues with his blood counts.  He states that he came to the ER because of pain and swelling in his legs x 2 weeks.  He describes issues with gout.    He describes his diet as good.  He eats meat every day.  He eats green beans.  He denies any pica.    Review of labs dating back to 02/22/2017 reveal a chronic macrocytic anemia.  Prior to this admission on 10/25/2017, hematocrit was 25.5, hemoglobin 8.9, MCV 117.3, platelets 261,000, WBC 11,000.  CBC on admission included a hematocrit of 22.7, hemoglobin 7.6, MCV 109.1, platelets 338,000, WBC 18,800.  CBC today reveals a hematocrit of 18.9, hemoglobin 6.4, MCV 108.6, platelets 200,000, WBC 10,500.  LDH was 208 (98-192). Creatinine was 1.5.  Albumen was 1.7.  Protein was 5.4.  AST was 68, ALT 16, alkaline phosphatase 300, and bilirubin 3.1.  B12 was 1096.  Folate was 5.1 (low).  Bilirubin 4.3 (direct 2.5) on 08/17/2018.  Urinalysis revealed no bilirubin or hematuria.  Peripheral smear revealed a macrocytic anemia, normal leukocyte and differential, and normal platelet count and morphology.  He received 2 units of PRBCS (08/18/2018 and  08/19/2018).  He was admitted to [facility] on 08/26/2018 with right knee pain and a septic joint.  Right knee tap revealed MRSA.  He underwent right knee arthroscopic irrigation and debridement with extensive synovectomy by Dr. Juanell Fairly on 08/26/2018.  Course was complicated by atrial fibrillation.  He underwent right knee  Incision and drainage on 09/03/2018 by Dr. Deeann Saint.  Approximately 300 cc of pus was removed.    Past Medical History:  Diagnosis Date  . CHF (congestive heart failure) (HCC)   . Cirrhosis (HCC)   . GERD (gastroesophageal reflux disease)   . Gout   . Hypertension   . Pancreatitis     Past Surgical History:  Procedure Laterality Date  . CARDIAC CATHETERIZATION    . COLON SURGERY    . ESOPHAGOGASTRODUODENOSCOPY (EGD) WITH PROPOFOL N/A 10/26/2017   Procedure: ESOPHAGOGASTRODUODENOSCOPY (EGD) WITH PROPOFOL;  Surgeon: [name], MD;  Location: [facility] ENDOSCOPY;  Service: Endoscopy;  Laterality: N/A;  . KNEE ARTHROSCOPY Right 08/26/2018   Procedure: ARTHROSCOPY KNEE;  Surgeon: Juanell Fairly, MD;  Location: [facility] ORS;  Service: Orthopedics;  Laterality: Right;    Family History  Problem Relation Age of Onset  . CVA Mother   . CAD Mother   . CAD Father     Social History:  reports that he has been smoking. He has never used smokeless tobacco. He reports current alcohol use. He reports that he does not use drugs.   The patient has smoked  1 ppd x 45 years.  The patient is accompanied by *** alone today.  Allergies:  Allergies  Allergen Reactions  . Penicillins Rash    Has patient had a PCN reaction causing immediate rash, facial/tongue/throat swelling, SOB or lightheadedness with hypotension: No Has patient had a PCN reaction causing severe rash involving mucus membranes or skin necrosis: No Has patient had a PCN reaction that required hospitalization: No Has patient had a PCN reaction occurring within the last 10 years: No If all of the above answers are  "NO", then may proceed with Cephalosporin use.     Current Medications: No current facility-administered medications for this visit.    No current outpatient medications on file.   Facility-Administered Medications Ordered in Other Visits  Medication Dose Route Frequency Provider Last Rate Last Dose  . 0.9 %  sodium chloride infusion (Manually program via Guardrails IV Fluids)   Intravenous Once Raliegh Scarlet, NP      . 0.9 %  sodium chloride infusion   Intravenous Continuous Dalia Heading, MD      . acetaminophen (TYLENOL) tablet 650 mg  650 mg Oral Q6H PRN Juanell Fairly, MD       Or  . acetaminophen (TYLENOL) suppository 650 mg  650 mg Rectal Q6H PRN Juanell Fairly, MD      . albuterol (PROVENTIL) (2.5 MG/3ML) 0.083% nebulizer solution 2.5 mg  2.5 mg Nebulization Q2H PRN Delfino Lovett, MD   2.5 mg at 09/03/18 1448  . allopurinol (ZYLOPRIM) tablet 100 mg  100 mg Oral Daily Juanell Fairly, MD   100 mg at 09/03/18 1026  . alum & mag hydroxide-simeth (MAALOX/MYLANTA) 200-200-20 MG/5ML suspension 30 mL  30 mL Oral Q4H PRN Juanell Fairly, MD   30 mL at 08/27/18 1512  . amiodarone (NEXTERONE PREMIX) 360-4.14 MG/200ML-% (1.8 mg/mL) IV infusion  30 mg/hr Intravenous Continuous Dalia Heading, MD 16.67 mL/hr at 09/03/18 2204 30 mg/hr at 09/03/18 2204  . benzonatate (TESSALON) capsule 100 mg  100 mg Oral BID PRN Lule, Joana, PA      . bisacodyl (DULCOLAX) suppository 10 mg  10 mg Rectal Daily PRN Juanell Fairly, MD      . colchicine tablet 0.6 mg  0.6 mg Oral Daily Juanell Fairly, MD   0.6 mg at 09/03/18 1026  . dextrose 5 % in lactated ringers infusion   Intravenous Continuous Tukov-Yual, Magdalene S, NP 75 mL/hr at 09/02/18 2057    . docusate sodium (COLACE) capsule 100 mg  100 mg Oral BID Juanell Fairly, MD   100 mg at 09/03/18 1012  . enoxaparin (LOVENOX) injection 40 mg  40 mg Subcutaneous Q24H Juanell Fairly, MD   40 mg at 09/03/18 0732  . folic acid (FOLVITE) tablet 1 mg   1 mg Oral Daily Juanell Fairly, MD   1 mg at 09/03/18 1012  . furosemide (LASIX) tablet 20 mg  20 mg Oral BID Juanell Fairly, MD   20 mg at 09/03/18 1831  . guaiFENesin-dextromethorphan (ROBITUSSIN DM) 100-10 MG/5ML syrup 5 mL  5 mL Oral Q4H PRN Juanell Fairly, MD   5 mL at 09/02/18 2235  . HYDROcodone-acetaminophen (NORCO) 7.5-325 MG per tablet 1-2 tablet  1-2 tablet Oral Q4H PRN Juanell Fairly, MD   2 tablet at 09/03/18 1650  . HYDROcodone-acetaminophen (NORCO/VICODIN) 5-325 MG per tablet 1-2 tablet  1-2 tablet Oral Q4H PRN Juanell Fairly, MD   2 tablet at 09/03/18 2056  . lidocaine (PF) (XYLOCAINE) 1 % injection 30 mL  30  mL Infiltration Once Deeann Saint, MD      . lidocaine (PF) (XYLOCAINE) 1 % injection 60 mL  60 mL Infiltration Once Deeann Saint, MD      . magnesium citrate solution 1 Bottle  1 Bottle Oral Once PRN Juanell Fairly, MD      . menthol-cetylpyridinium (CEPACOL) lozenge 3 mg  1 lozenge Oral PRN Juanell Fairly, MD       Or  . phenol (CHLORASEPTIC) mouth spray 1 spray  1 spray Mouth/Throat PRN Juanell Fairly, MD      . methocarbamol (ROBAXIN) tablet 500 mg  500 mg Oral Q6H PRN Juanell Fairly, MD       Or  . methocarbamol (ROBAXIN) 500 mg in dextrose 5 % 50 mL IVPB  500 mg Intravenous Q6H PRN Juanell Fairly, MD      . metoCLOPramide (REGLAN) tablet 5-10 mg  5-10 mg Oral Q8H PRN Juanell Fairly, MD       Or  . metoCLOPramide (REGLAN) injection 5-10 mg  5-10 mg Intravenous Q8H PRN Juanell Fairly, MD      . metoprolol tartrate (LOPRESSOR) tablet 25 mg  25 mg Oral BID Juanell Fairly, MD   25 mg at 09/03/18 2159  . multivitamin with minerals tablet 1 tablet  1 tablet Oral Daily Juanell Fairly, MD   1 tablet at 09/03/18 1012  . ondansetron (ZOFRAN) tablet 4 mg  4 mg Oral Q6H PRN Juanell Fairly, MD       Or  . ondansetron [facility]) injection 4 mg  4 mg Intravenous Q6H PRN Juanell Fairly, MD      . pantoprazole (PROTONIX) EC tablet 40 mg  40 mg Oral  Daily Juanell Fairly, MD   40 mg at 09/03/18 1014  . polyethylene glycol (MIRALAX / GLYCOLAX) packet 17 g  17 g Oral Daily PRN Juanell Fairly, MD      . sodium chloride flush (NS) 0.9 % injection 3 mL  3 mL Intravenous Once Juanell Fairly, MD      . tamsulosin [facility]) capsule 0.4 mg  0.4 mg Oral Daily Juanell Fairly, MD   0.4 mg at 09/03/18 1014  . thiamine (VITAMIN B-1) tablet 100 mg  100 mg Oral Daily Juanell Fairly, MD   100 mg at 09/02/18 1017   Or  . thiamine (B-1) injection 100 mg  100 mg Intravenous Daily Juanell Fairly, MD   100 mg at 09/03/18 1013  . vancomycin (VANCOCIN) 1,250 mg in sodium chloride 0.9 % 250 mL IVPB  1,250 mg Intravenous Q24H Sherryll Burger, Vipul, MD 166.7 mL/hr at 09/03/18 2051 1,250 mg at 09/03/18 2051  . vancomycin variable dose per unstable renal function (pharmacist dosing)   Does not apply See admin instructions Albina Billet, [facility]      . vitamin B-12 (CYANOCOBALAMIN) tablet 1,000 mcg  1,000 mcg Oral Daily Juanell Fairly, MD   1,000 mcg at 09/03/18 1014    Review of Systems:  GENERAL:  Feels good.  Active.  No fevers, sweats or weight loss. PERFORMANCE STATUS (ECOG):  *** HEENT:  No visual changes, runny nose, sore throat, mouth sores or tenderness. Lungs: No shortness of breath or cough.  No hemoptysis. Cardiac:  No chest pain, palpitations, orthopnea, or PND. GI:  No nausea, vomiting, diarrhea, constipation, melena or hematochezia. GU:  No urgency, frequency, dysuria, or hematuria. Musculoskeletal:  No back pain.  No joint pain.  No muscle tenderness. Extremities:  No pain or swelling. Skin:  No rashes or skin changes. Neuro:  No headache, numbness or weakness, balance or coordination issues. Endocrine:  No diabetes, thyroid issues, hot flashes or night sweats. Psych:  No mood changes, depression or anxiety. Pain:  No focal pain. Review of systems:  All other systems reviewed and found to be negative.  Physical Exam: There were no vitals  taken for this visit. GENERAL:  Well developed, well nourished, **man sitting comfortably in the exam room in no acute distress. MENTAL STATUS:  Alert and oriented to person, place and time. HEAD:  *** hair.  Normocephalic, atraumatic, face symmetric, no Cushingoid features. EYES:  *** eyes.  Pupils equal round and reactive to light and accomodation.  No conjunctivitis or scleral icterus. ENT:  Oropharynx clear without lesion.  Tongue normal. Mucous membranes moist.  RESPIRATORY:  Clear to auscultation without rales, wheezes or rhonchi. CARDIOVASCULAR:  Regular rate and rhythm without murmur, rub or gallop. ABDOMEN:  Soft, non-tender, with active bowel sounds, and no hepatosplenomegaly.  No masses. SKIN:  No rashes, ulcers or lesions. EXTREMITIES: No edema, no skin discoloration or tenderness.  No palpable cords. LYMPH NODES: No palpable cervical, supraclavicular, axillary or inguinal adenopathy  NEUROLOGICAL: Unremarkable. PSYCH:  Appropriate.   No results displayed because visit has over 200 results.      Assessment:  Larry Andrade. is a 58 y.o. male with macrocytic RBC indices felt secondary to cirrhosis and folate deficiency.  He has a history of cirrhosissecondary to alcohol use. He has had macrocytic RBC indices since at least 01/2017.  He drinks alcohol daily. He notes a history of transfusion years ago and a colectomy for a mass (no records available) 5-6 years ago.  CBC on 02/20/2020included a hematocrit of 22.7, hemoglobin 7.6, MCV 109.1, platelets 338,000, WBC 18,800. LDH was 208 (98-192). Creatinine was 1.5. Albumen was 1.7. Protein was 5.4. AST was 68, ALT 16, alkaline phosphatase 300, and bilirubin 3.1. B12was 1096. Folatewas 5.1 (low). Bilirubin4.3 (direct 2.5) on 08/17/2018. Urinalysis revealed no bilirubin or hematuria.  Ferritin was 1003. TSH and free T4 were normal.  Retic was 1.2%.   Peripheral smearrevealed a macrocytic anemia, normal leukocyte and  differential, and normal platelet count and morphology.  He received 2 units of PRBCs (08/18/2018 and 08/19/2018).  Plan: 1.   2.   3.   4.    Rosey Bath, MD  09/04/2018, 2:33 AM   I saw and evaluated the patient, participating in the key portions of the service and reviewing pertinent diagnostic studies and records.  I reviewed the nurse practitioner's note and agree with the findings and the plan.  The assessment and plan were discussed with the patient.  Additional diagnostic studies of *** are needed to clarify *** and would change the clinical management.  A few ***multiple questions were asked by the patient and answered.   Nelva Nay, MD 09/04/2018,2:33 AM

## 2018-09-04 NOTE — Progress Notes (Signed)
Date of Admission:  08/26/2018     ID: Larry Andrade. is a 58 y.o. male  Active Problems:   Sepsis (HCC) MRSA bacteremia MRSA rt septic knee Gout Afib   Subjective: Developed Afib over the weekend Had another I/D at bedside on 09/03/18   Medications:  . sodium chloride   Intravenous Once  . allopurinol  100 mg Oral Daily  . amiodarone  400 mg Oral BID  . colchicine  0.6 mg Oral Daily  . docusate sodium  100 mg Oral BID  . enoxaparin (LOVENOX) injection  40 mg Subcutaneous Q24H  . feeding supplement (ENSURE ENLIVE)  237 mL Oral BID BM  . folic acid  1 mg Oral Daily  . furosemide  20 mg Oral BID  . lidocaine (PF)  30 mL Infiltration Once  . lidocaine (PF)  60 mL Infiltration Once  . metoprolol tartrate  25 mg Oral BID  . multivitamin with minerals  1 tablet Oral Daily  . pantoprazole  40 mg Oral Daily  . sodium chloride flush  3 mL Intravenous Once  . tamsulosin  0.4 mg Oral Daily  . thiamine  100 mg Oral Daily   Or  . thiamine  100 mg Intravenous Daily  . cyanocobalamin  1,000 mcg Oral Daily    Objective: Vital signs in last 24 hours: Temp:  [98.6 F (37 C)-99.8 F (37.7 C)] 99 F (37.2 C) (03/09 0730) Pulse Rate:  [88-116] 101 (03/09 0730) Resp:  [18-23] 18 (03/09 0520) BP: (101-138)/(65-89) 123/76 (03/09 0730) SpO2:  [92 %-97 %] 97 % (03/09 0730) Weight:  [92.8 kg] 92.8 kg (03/09 0520)  PHYSICAL EXAM:  General: Alert, cooperative, no distress, appears stated age.  Head: Normocephalic, without obvious abnormality, atraumatic. Eyes: Conjunctivae clear, anicteric sclerae. Pupils are equal ENT Nares normal. No drainage or sinus tenderness. Lips, mucosa, and tongue normal. No Thrush Neck: Supple, symmetrical, no adenopathy, thyroid: non tender no carotid bruit and no JVD. Back: No CVA tenderness. Lungs: Clear to auscultation bilaterally. No Wheezing or Rhonchi. No rales. Heart: Regular rate and rhythm, no murmur, rub or gallop. Abdomen: Soft, non-tender,not  distended. Bowel sounds normal. No masses Extremities: atraumatic, no cyanosis. No edema. No clubbing Skin: No rashes or lesions. Or bruising Lymph: Cervical, supraclavicular normal. Neurologic: Grossly non-focal  Lab Results Recent Labs    09/03/18 0552 09/04/18 0840  WBC 23.0* 24.9*  HGB 8.7* 9.7*  HCT 27.2* 30.0*  NA 131* 132*  K 3.9 4.5  CL 96* 96*  CO2 28 26  BUN 19 18  CREATININE 1.00 1.09     Microbiology: 2/29 BC -MRSA 08/29/18 BC NG 3/6 - synovial fluid- MRSA   Assessment/Plan:  Afib-received Cardizem and now on amiodarone- followed by cardiology    MRSA bacteremia with MRSA septic arthritis rt knee- on vancomycin- MIC 0.5 Recent b/l knee arthrocentesis and kenalog injection- Rt knee fluid was not sent for culture, hence not sure how long he had the MRSA in the knee and also no blood culture was sent last time  TEE could not be done as patient was agitated with sedation  repeat blood cultureNG so far-but persistent MRSA in the Rt knee   Leucocytosis persisting-  Had aspiration on the rt knee on 3/6 and I/D on 3/8- wash out planned tomorrow Discussed with Dr.Fath to see whether TEE can be done at the same time  ? Gout-  Management as per primary team  CHF   Anemia  Cirrhosis liver  Discussed the management with patient and his wife

## 2018-09-04 NOTE — Progress Notes (Signed)
Peripherally Inserted Central Catheter/Midline Placement  The IV Nurse has discussed with the patient and/or persons authorized to consent for the patient, the purpose of this procedure and the potential benefits and risks involved with this procedure.  The benefits include less needle sticks, lab draws from the catheter, and the patient may be discharged home with the catheter. Risks include, but not limited to, infection, bleeding, blood clot (thrombus formation), and puncture of an artery; nerve damage and irregular heartbeat and possibility to perform a PICC exchange if needed/ordered by physician.  Alternatives to this procedure were also discussed.  Bard Power PICC patient education guide, fact sheet on infection prevention and patient information card has been provided to patient /or left at bedside.    PICC/Midline Placement Documentation        Larry Andrade 09/04/2018, 1:56 PM

## 2018-09-05 ENCOUNTER — Inpatient Hospital Stay: Payer: Medicare Other | Admitting: Certified Registered Nurse Anesthetist

## 2018-09-05 ENCOUNTER — Encounter
Admission: EM | Disposition: A | Discharge status: Skilled Nursing Facility | Payer: Self-pay | Source: Home / Self Care | Attending: Internal Medicine

## 2018-09-05 ENCOUNTER — Other Ambulatory Visit: Payer: Self-pay

## 2018-09-05 HISTORY — PX: I & D EXTREMITY: SHX5045

## 2018-09-05 LAB — CBC
HCT: 26 % — ABNORMAL LOW (ref 39.0–52.0)
Hemoglobin: 8.4 g/dL — ABNORMAL LOW (ref 13.0–17.0)
MCH: 32.3 pg (ref 26.0–34.0)
MCHC: 32.3 g/dL (ref 30.0–36.0)
MCV: 100 fL (ref 80.0–100.0)
PLATELETS: 243 10*3/uL (ref 150–400)
RBC: 2.6 MIL/uL — AB (ref 4.22–5.81)
RDW: 20.8 % — ABNORMAL HIGH (ref 11.5–15.5)
WBC: 21.8 10*3/uL — ABNORMAL HIGH (ref 4.0–10.5)
nRBC: 0 % (ref 0.0–0.2)

## 2018-09-05 LAB — BASIC METABOLIC PANEL
ANION GAP: 9 (ref 5–15)
BUN: 19 mg/dL (ref 6–20)
CO2: 29 mmol/L (ref 22–32)
Calcium: 7.7 mg/dL — ABNORMAL LOW (ref 8.9–10.3)
Chloride: 95 mmol/L — ABNORMAL LOW (ref 98–111)
Creatinine, Ser: 1.14 mg/dL (ref 0.61–1.24)
GFR calc Af Amer: >60 mL/min (ref >60)
GFR calc non Af Amer: >60 mL/min (ref >60)
Glucose, Bld: 103 mg/dL — ABNORMAL HIGH (ref 70–99)
Potassium: 3.9 mmol/L (ref 3.5–5.1)
Sodium: 133 mmol/L — ABNORMAL LOW (ref 135–145)

## 2018-09-05 LAB — AEROBIC CULTURE W GRAM STAIN (SUPERFICIAL SPECIMEN): Culture: NORMAL

## 2018-09-05 LAB — MAGNESIUM: Magnesium: 1.7 mg/dL (ref 1.7–2.4)

## 2018-09-05 LAB — VANCOMYCIN, PEAK: VANCOMYCIN PK: 28 ug/mL — AB (ref 30–40)

## 2018-09-05 LAB — VANCOMYCIN, TROUGH: Vancomycin Tr: 14 ug/mL — ABNORMAL LOW (ref 15–20)

## 2018-09-05 SURGERY — IRRIGATION AND DEBRIDEMENT EXTREMITY
Anesthesia: General | Site: Knee | Laterality: Right

## 2018-09-05 MED ORDER — NEOMYCIN-POLYMYXIN B GU 40-200000 IR SOLN
Status: AC
  Filled 2018-09-05: qty 1

## 2018-09-05 MED ORDER — PROPOFOL 10 MG/ML IV BOLUS
INTRAVENOUS | Status: AC
  Filled 2018-09-05: qty 20

## 2018-09-05 MED ORDER — MORPHINE SULFATE (PF) 2 MG/ML IV SOLN
2.0000 mg | INTRAVENOUS | Status: DC | PRN
  Administered 2018-09-05: 2 mg via INTRAVENOUS
  Filled 2018-09-05: qty 1

## 2018-09-05 MED ORDER — FENTANYL CITRATE (PF) 100 MCG/2ML IJ SOLN
INTRAMUSCULAR | Status: DC | PRN
  Administered 2018-09-05 (×2): 50 ug via INTRAVENOUS
  Administered 2018-09-05: 25 ug via INTRAVENOUS
  Administered 2018-09-05: 50 ug via INTRAVENOUS
  Administered 2018-09-05: 25 ug via INTRAVENOUS

## 2018-09-05 MED ORDER — PROPOFOL 10 MG/ML IV BOLUS
INTRAVENOUS | Status: DC | PRN
  Administered 2018-09-05: 120 mg via INTRAVENOUS

## 2018-09-05 MED ORDER — FENTANYL CITRATE (PF) 100 MCG/2ML IJ SOLN
INTRAMUSCULAR | Status: AC
  Administered 2018-09-05: 25 ug via INTRAVENOUS
  Filled 2018-09-05: qty 2

## 2018-09-05 MED ORDER — ONDANSETRON HCL 4 MG/2ML IJ SOLN: 4.0000 mg | Freq: Once | INTRAMUSCULAR | Status: DC | PRN

## 2018-09-05 MED ORDER — FENTANYL CITRATE (PF) 100 MCG/2ML IJ SOLN
INTRAMUSCULAR | Status: AC
  Filled 2018-09-05: qty 2

## 2018-09-05 MED ORDER — FENTANYL CITRATE (PF) 100 MCG/2ML IJ SOLN
25.0000 ug | INTRAMUSCULAR | Status: DC | PRN
  Administered 2018-09-05 (×4): 25 ug via INTRAVENOUS

## 2018-09-05 MED ORDER — MAGNESIUM OXIDE 400 (241.3 MG) MG PO TABS
400.0000 mg | ORAL_TABLET | Freq: Every day | ORAL | Status: DC
  Administered 2018-09-05 – 2018-09-08 (×4): 400 mg via ORAL
  Filled 2018-09-05 (×4): qty 1

## 2018-09-05 MED ORDER — LACTATED RINGERS IV SOLN
INTRAVENOUS | Status: DC
  Administered 2018-09-05: 15:00:00 via INTRAVENOUS

## 2018-09-05 MED ORDER — NEOMYCIN-POLYMYXIN B GU 40-200000 IR SOLN
Status: DC | PRN
  Administered 2018-09-05: 40 mL

## 2018-09-05 SURGICAL SUPPLY — 44 items
BANDAGE ACE 6X5 VEL STRL LF (GAUZE/BANDAGES/DRESSINGS) ×6 IMPLANT
BLADE SURG 10 STRL SS SAFETY (BLADE) ×3 IMPLANT
BLADE SURG 15 STRL LF DISP TIS (BLADE) ×2 IMPLANT
BLADE SURG 15 STRL SS (BLADE) ×4
BNDG COHESIVE 4X5 TAN STRL (GAUZE/BANDAGES/DRESSINGS) ×3 IMPLANT
BNDG ESMARK 4X12 TAN STRL LF (GAUZE/BANDAGES/DRESSINGS) ×3 IMPLANT
CANISTER SUCT 1200ML W/VALVE (MISCELLANEOUS) ×3 IMPLANT
CANISTER SUCT 3000ML PPV (MISCELLANEOUS) ×9 IMPLANT
CAST PADDING 3X4FT ST 30246 (SOFTGOODS) ×6
CHLORAPREP W/TINT 26 (MISCELLANEOUS) ×6 IMPLANT
COVER WAND RF STERILE (DRAPES) ×3 IMPLANT
CUFF TOURN 34 STER (MISCELLANEOUS) IMPLANT
CUFF TOURN SGL QUICK 24 (TOURNIQUET CUFF) ×2
CUFF TRNQT CYL 24X4X16.5-23 (TOURNIQUET CUFF) ×1 IMPLANT
DRAPE U-SHAPE 47X51 STRL (DRAPES) ×3 IMPLANT
DURAPREP 26ML APPLICATOR (WOUND CARE) IMPLANT
ELECT REM PT RETURN 9FT ADLT (ELECTROSURGICAL) ×3
ELECTRODE REM PT RTRN 9FT ADLT (ELECTROSURGICAL) ×1 IMPLANT
GAUZE PETRO XEROFOAM 1X8 (MISCELLANEOUS) ×3 IMPLANT
GAUZE SPONGE 4X4 12PLY STRL (GAUZE/BANDAGES/DRESSINGS) ×9 IMPLANT
GLOVE BIOGEL PI IND STRL 9 (GLOVE) ×1 IMPLANT
GLOVE BIOGEL PI INDICATOR 9 (GLOVE) ×2
GLOVE SURG 9.0 ORTHO LTXF (GLOVE) ×6 IMPLANT
GOWN STRL REUS TWL 2XL XL LVL4 (GOWN DISPOSABLE) ×3 IMPLANT
GOWN STRL REUS W/ TWL LRG LVL3 (GOWN DISPOSABLE) ×2 IMPLANT
GOWN STRL REUS W/TWL LRG LVL3 (GOWN DISPOSABLE) ×4
HANDLE YANKAUER SUCT BULB TIP (MISCELLANEOUS) ×3 IMPLANT
HEMOVAC 400CC 10FR (MISCELLANEOUS) ×3 IMPLANT
MAT ABSORB  FLUID 56X50 GRAY (MISCELLANEOUS)
MAT ABSORB FLUID 56X50 GRAY (MISCELLANEOUS) IMPLANT
NS IRRIG 1000ML POUR BTL (IV SOLUTION) ×3 IMPLANT
PACK EXTREMITY ARMC (MISCELLANEOUS) ×3 IMPLANT
PAD ABD DERMACEA PRESS 5X9 (GAUZE/BANDAGES/DRESSINGS) ×3 IMPLANT
PAD CAST CTTN 3X4 STRL (SOFTGOODS) ×3 IMPLANT
PULSAVAC PLUS IRRIG FAN TIP (DISPOSABLE) ×3
SOL .9 NS 3000ML IRR  AL (IV SOLUTION) ×6
SOL .9 NS 3000ML IRR UROMATIC (IV SOLUTION) ×3 IMPLANT
SPONGE LAP 18X18 RF (DISPOSABLE) ×3 IMPLANT
STAPLER SKIN PROX 35W (STAPLE) ×3 IMPLANT
STOCKINETTE STRL 4IN 9604848 (GAUZE/BANDAGES/DRESSINGS) ×3 IMPLANT
SUT ETHILON 3-0 FS-10 30 BLK (SUTURE) ×3
SUT PDS AB 0 CT1 27 (SUTURE) ×9 IMPLANT
SUTURE EHLN 3-0 FS-10 30 BLK (SUTURE) ×1 IMPLANT
TIP FAN IRRIG PULSAVAC PLUS (DISPOSABLE) ×1 IMPLANT

## 2018-09-05 NOTE — Anesthesia Post-op Follow-up Note (Signed)
Anesthesia QCDR form completed.        

## 2018-09-05 NOTE — Transfer of Care (Signed)
Immediate Anesthesia Transfer of Care Note  Patient: Larry Andrade.  Procedure(s) Performed: IRRIGATION AND DEBRIDEMENT EXTREMITY (Right Knee)  Patient Location: PACU  Anesthesia Type:General  Level of Consciousness: awake  Airway & Oxygen Therapy: Patient Spontanous Breathing and Patient connected to face mask oxygen  Post-op Assessment: Report given to RN and Post -op Vital signs reviewed and stable  Post vital signs: Reviewed and stable  Last Vitals:  Vitals Value Taken Time  BP    Temp    Pulse    Resp    SpO2      Last Pain:  Vitals:   09/05/18 1451  TempSrc: Tympanic  PainSc: 8       Patients Stated Pain Goal: 4 (09/04/18 1406)  Complications: No apparent anesthesia complications

## 2018-09-05 NOTE — Progress Notes (Signed)
[employer] - [place] at [facility]   PATIENT NAME: Larry Andrade    MR#:  [id number]  DATE OF BIRTH:  02/13/1961  SUBJECTIVE:  CHIEF COMPLAINT:   Chief Complaint  Patient presents with  . Knee Pain   No new complaints this morning. Had bedside I&D with about 300 cc of purulent material drained out by orthopedic physician at bedside on 09/03/2018.  Patient scheduled for washout in the OR the OR by orthopedic physician today. No fevers. REVIEW OF SYSTEMS:  Review of Systems  Constitutional: Negative for chills and fever.  HENT: Negative for hearing loss and tinnitus.   Eyes: Negative for blurred vision and double vision.  Respiratory: Negative for cough, sputum production and shortness of breath.   Cardiovascular: Negative for chest pain, palpitations and orthopnea.  Gastrointestinal: Negative for heartburn, nausea and vomiting.  Genitourinary: Negative for dysuria and urgency.  Musculoskeletal: Negative for myalgias and neck pain.       Gradual improvement in right knee swelling  Skin: Negative for itching and rash.  Neurological: Negative for dizziness and headaches.  Psychiatric/Behavioral: Negative for depression and hallucinations.    DRUG ALLERGIES:   Allergies  Allergen Reactions  . Penicillins Rash    Has patient had a PCN reaction causing immediate rash, facial/tongue/throat swelling, SOB or lightheadedness with hypotension: No Has patient had a PCN reaction causing severe rash involving mucus membranes or skin necrosis: No Has patient had a PCN reaction that required hospitalization: No Has patient had a PCN reaction occurring within the last 10 years: No If all of the above answers are "NO", then may proceed with Cephalosporin use.    VITALS:  Blood pressure 128/90, pulse 98, temperature 99.5 F (37.5 C), temperature source Oral, resp. rate 18, height 6\' 1"  (1.854 m), weight 89.8 kg, SpO2 96 %. PHYSICAL EXAMINATION:   Physical Exam    Constitutional: He is oriented to person, place, and time. He appears well-developed and well-nourished.  HENT:  Head: Normocephalic and atraumatic.  Right Ear: External ear normal.  Mouth/Throat: Oropharynx is clear and moist. No oropharyngeal exudate.  Eyes: Pupils are equal, round, and reactive to light. Conjunctivae and EOM are normal. Right eye exhibits no discharge.  Neck: Normal range of motion. No tracheal deviation present.  Cardiovascular: Normal rate, regular rhythm and normal heart sounds.  Respiratory: Effort normal and breath sounds normal. No stridor. He has no wheezes.  GI: Soft. Bowel sounds are normal. There is no abdominal tenderness.  Musculoskeletal:        General: Edema present. No tenderness.     Comments: Dressing in place over right knee joint.  Neurological: He is alert and oriented to person, place, and time. No cranial nerve deficit.  Skin: Skin is warm. No erythema.  Psychiatric: He has a normal mood and affect. His behavior is normal.     LABORATORY PANEL:  Male CBC Recent Labs  Lab 09/05/18 0045  WBC 21.8*  HGB 8.4*  HCT 26.0*  PLT 243   ------------------------------------------------------------------------------------------------------------------ Chemistries  Recent Labs  Lab 09/01/18 0510  09/05/18 0045  NA 134*   < > 133*  K 4.3   < > 3.9  CL 98   < > 95*  CO2 28   < > 29  GLUCOSE 122*   < > 103*  BUN 21*   < > 19  CREATININE 0.99   < > 1.14  CALCIUM 8.0*   < > 7.7*  MG  --    < >  1.7  AST 41  --   --   ALT 29  --   --   ALKPHOS 243*  --   --   BILITOT 2.0*  --   --    < > = values in this interval not displayed.   RADIOLOGY:  No results found. ASSESSMENT AND PLAN:   58 year old male with EtOH liver cirrhosis, chronic diastolic heart failure with preserved ejection fraction, ongoing tobacco abuse who was discharged from the hospital on2/24/2020with exacerbation of CHF and gout flare,who thenpresentedto the emergency  room with right knee swelling and pain.   Plan: 1. Sepsis from right knee septic joint, resolving: Patient presentedwith tachycardia, tachypnea and leukocytosis Gram stain showing gram-positive cocci, blood culture growing MRSA, synovial fluid cultureS Aureus.  Patient still has leukocytosis with white count of 24,000. Seen by infectious disease specialist.  Recommendation is to continue vancomycin for 4-6 weeks IV per ID/Dr. Rivka Safer.  S/p R knee arthroscopic I&D withDr. Martha Clan.  Patient had about 120 cc aspirated on 09/01/2018  No evidence of DVT on venous Doppler ultrasound recently.   Lactic acid2.59from 2.19most recently.  Had bedside I&D with about 300 cc of purulent material drained out by orthopedic physician at bedside on 09/03/2018. Scheduled for washout in the OR by orthopedic physician today. Seen by PT recommended SNF.Continue towork with PT as tolerated.Reassess for needs as activity tolerance increases with pain control. 09/01/2018 TEE not performed. See note Dr. Lady Gary. Will require 4-6 weeks IV abx regardless. PICC line placed today   2. Chronic diastolic heart failure with preserved ejection fraction:No signs of exacerbation at this time. Continue to monitor. ContinuehomeLasix.  3. EtOH abuse with liver cirrhosis: Patient reports his last drink was Wednesday prior to admission CIWA protocoldiscontinued. No agitationx 6 days No ascites on recent abdominal ultrasound.  4.Hypomagnesemia, hypocalcemia:Repleted,   5.Gout:Continue allopurinol and colchicine. Monitor.  Improving  6. Essential hypertension:Continue metoprolol  7. Anemia Hbgstable 8.7today. Macrocytic. Chronic EtOH abuse. 1 u pRBCsgiven prior. Recheck AM labs. Added on folate and B12 check,within normal limits.  8.Tobacco dependence:Patient is encouraged to quit smoking. Counseling was providedand will be a discharge.  9. ZOX:WRUEAVWU tamsulosin   10.  CoughPRNs and breathing treatmentsordered. Monitor clinically. Reassess for acute cardiopulmonary processes/CHF exacerbation as needed.  Given his smoking history (40 pack-year history) and the duration of the cough (months) suspect he has underlyingchroniclung disease,question COPD. Did have a CXR recently, some scarring at the lung base only.Patient should be encouraged tofollow-up outpatient for PFTs withaPCP.  11.  Atrial fibrillation with rapid ventricular response. Heart rate in the 160s.  Patient denies any chest pain.  No shortness of breath. Currently no evidence of alcohol withdrawal clinically. Patient was transferred to the ICU on 09/02/2018 and heart rate remained uncontrolled with Cardizem drip.  Was subsequently started on amiodarone drip.  Patient already transitioned to p.o. amiodarone  At discharge patient should only be on 400 mg twice daily for 1 week followed by 200 mg twice daily for 3 weeks and then 200 mg daily.    Anticoagulation not yet initiated given present comorbidities and plans for surgical intervention today  DVT prophylaxis; Lovenox   All the records are reviewed and case discussed with Care Management/Social Worker. Management plans discussed with the patient, family and they are in agreement.  CODE STATUS: Full Code  TOTAL TIME TAKING CARE OF THIS PATIENT: 37 minutes.   More than 50% of the time was spent in counseling/coordination of care: YES  POSSIBLE D/C IN  2 DAYS, DEPENDING ON CLINICAL CONDITION.   Karas Pickerill M.D on 09/05/2018 at 2:26 PM  Between 7am to 6pm - Pager - [phone]  After 6pm go to [email or web address] - Scientist, research (life sciences)  Hospitalists  Office  [phone]  CC: Primary care physician; Gildardo Pounds, PA  Note: This dictation was prepared with Dragon dictation along with smaller phrase technology. Any transcriptional errors that result from this process are unintentional.

## 2018-09-05 NOTE — Progress Notes (Signed)
Patient Name: Larry Andrade. Date of Encounter: 09/05/2018  Hospital Problem List     Active Problems:   Sepsis [facility])   Malnutrition of moderate degree    Patient Profile     58 year old male with history of septic knee and bacteremia now with atrial fibrillation with variable ventricular response.  Has converted back to sinus rhythm.  Subjective   Knee pain  Inpatient Medications    . sodium chloride   Intravenous Once  . allopurinol  100 mg Oral Daily  . amiodarone  400 mg Oral BID  . colchicine  0.6 mg Oral Daily  . docusate sodium  100 mg Oral BID  . feeding supplement (ENSURE ENLIVE)  237 mL Oral BID BM  . folic acid  1 mg Oral Daily  . furosemide  20 mg Oral BID  . lidocaine (PF)  30 mL Infiltration Once  . lidocaine (PF)  60 mL Infiltration Once  . metoprolol tartrate  25 mg Oral BID  . multivitamin with minerals  1 tablet Oral Daily  . pantoprazole  40 mg Oral Daily  . sodium chloride flush  10-40 mL Intracatheter Q12H  . tamsulosin  0.4 mg Oral Daily  . thiamine  100 mg Oral Daily   Or  . thiamine  100 mg Intravenous Daily  . cyanocobalamin  1,000 mcg Oral Daily    Vital Signs    Vitals:   09/04/18 1950 09/05/18 0200 09/05/18 0418 09/05/18 0735  BP: 114/75  116/84 128/90  Pulse: (!) 104 91 91 98  Resp: 20  20 18   Temp: 99.1 F (37.3 C)  98.5 F (36.9 C) 99.5 F (37.5 C)  TempSrc: Oral  Oral Oral  SpO2: 95%  95% 96%  Weight:   89.8 kg   Height:        Intake/Output Summary (Last 24 hours) at 09/05/2018 0755 Last data filed at 09/05/2018 0418 Gross per 24 hour  Intake 785.69 ml  Output 1800 ml  Net -1014.31 ml   Filed Weights   09/02/18 1833 09/04/18 0520 09/05/18 0418  Weight: 93.6 kg 92.8 kg 89.8 kg    Physical Exam    GEN: Well nourished, well developed, in no acute distress.  HEENT: normal.  Neck: Supple, no JVD, carotid bruits, or masses. Cardiac: RRR, no murmurs Respiratory:  Respirations regular and unlabored, clear to  auscultation bilaterally. GI: Soft, nontender, nondistended, BS + x 4. MS: no deformity or atrophy. Skin: Right knee wrapped Neuro:  Strength and sensation are intact. Psych: Normal affect.  Labs    CBC Recent Labs    09/04/18 0840 09/05/18 0045  WBC 24.9* 21.8*  HGB 9.7* 8.4*  HCT 30.0* 26.0*  MCV 99.7 100.0  PLT 287 243   Basic Metabolic Panel Recent Labs    78/93/81 0552 09/04/18 0840 09/05/18 0045  NA 131* 132* 133*  K 3.9 4.5 3.9  CL 96* 96* 95*  CO2 28 26 29   GLUCOSE 84 89 103*  BUN 19 18 19   CREATININE 1.00 1.09 1.14  CALCIUM 8.0* 8.4* 7.7*  MG 2.1 1.8 1.7  PHOS 3.2  --   --    Liver Function Tests No results for input(s): AST, ALT, ALKPHOS, BILITOT, PROT, ALBUMIN in the last 72 hours. No results for input(s): LIPASE, AMYLASE in the last 72 hours. Cardiac Enzymes No results for input(s): CKTOTAL, CKMB, CKMBINDEX, TROPONINI in the last 72 hours. BNP No results for input(s): BNP in the last 72 hours. D-Dimer No  results for input(s): DDIMER in the last 72 hours. Hemoglobin A1C No results for input(s): HGBA1C in the last 72 hours. Fasting Lipid Panel No results for input(s): CHOL, HDL, LDLCALC, TRIG, CHOLHDL, LDLDIRECT in the last 72 hours. Thyroid Function Tests No results for input(s): TSH, T4TOTAL, T3FREE, THYROIDAB in the last 72 hours.  Invalid input(s): FREET3  Telemetry    Sinus rhythm  ECG       Radiology    Dg Chest 2 View  Result Date: 08/16/2018 CLINICAL DATA:  Shortness of breath and congestion for 2 weeks. History of CHF. EXAM: CHEST - 2 VIEW COMPARISON:  None. FINDINGS: Cardiac silhouette is mildly enlarged. Calcified coronary artery versus stent. Calcified aortic arch. LEFT lung base scarring. No pleural effusion or focal consolidation. No pneumothorax. Old RIGHT rib fractures. Osteopenia. IMPRESSION: Mild cardiomegaly.  No acute pulmonary process. Electronically Signed   By: Awilda Metro M.D.   On: 08/16/2018 17:06   Dg Knee  1-2 Views Right  Result Date: 08/26/2018 CLINICAL DATA:  Knee pain.  No known injury.  Initial encounter. EXAM: RIGHT KNEE - 1-2 VIEW COMPARISON:  None. FINDINGS: A moderate to large knee effusion is noted. Tricompartmental degenerative changes are present. No acute fracture or dislocation. IMPRESSION: Moderate to large knee effusion without acute bony abnormality. Tricompartment degenerative changes. Electronically Signed   By: Harmon Pier M.D.   On: 08/26/2018 18:50   Ct Tibia Fibula Right W Contrast  Result Date: 08/26/2018 CLINICAL DATA:  Right lower extremity pain and swelling. EXAM: CT OF THE LOWER RIGHT EXTREMITY WITH CONTRAST TECHNIQUE: Multidetector CT imaging of the lower right extremity was performed according to the standard protocol following intravenous contrast administration. COMPARISON:  Radiographs 08/26/2018 CONTRAST:  OMNIPAQUE IOHEXOL 300 MG/ML  SOLN FINDINGS: There is a very large and complex right knee joint effusion with enhancing synovium and synovial calcifications. There is also some air in the joint but this may be related to recent prior joint aspiration. There is also an erosive process involving the tibiofibular joint with a large erosion involving the fibular head and nearby adjacent tibia. 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 Similar findings at the ankle joint with a large complex joint effusion, synovial enhancement and nodular calcified tissue. There also scattered erosions involving the tibia, fibula and talus. Extensive erosive changes are also noted involving the cuneiforms. The anterior ankle tendons demonstrate thickening, nodularity and calcification which is also likely gout related. There is diffuse and fairly marked subcutaneous soft tissue swelling/edema/fluid involving the entire right lower extremity which would suggest cellulitis. No discrete rim enhancing drainable fluid collection to suggest an abscess. No  findings for myofasciitis or pyomyositis. IMPRESSION: 1. Large complex joint effusions involving the knee joint and ankle joint as detailed above. I think the findings are most likely due to gout. Extensive tophaceous changes. Recommend correlation with recent joint aspirations. Could not totally exclude the possibility of superimposed septic arthritis. 2. Erosive changes and soft tissue tophi surrounding the tibiofibular joint proximally. 3. Advanced erosive changes involving the proximal and midfoot bony structures. 4. Tophaceous changes involving the anterior ankle tendons. 5. Diffuse subcutaneous soft tissue swelling/edema/fluid suggesting cellulitis. No definite findings for myofasciitis or pyomyositis. Electronically Signed   By: Rudie Meyer M.D.   On: 08/26/2018 19:23   US Venous Img Lower Unilateral Right  Result Date: 08/27/2018 CLINICAL DATA:  Right lower extremity swelling and pain. EXAM: RIGHT LOWER EXTREMITY VENOUS DOPPLER ULTRASOUND TECHNIQUE: Gray-scale sonography with  graded compression, as well as color Doppler and duplex ultrasound were performed to evaluate the lower extremity deep venous systems from the level of the common femoral vein and including the common femoral, femoral, profunda femoral, popliteal and calf veins including the posterior tibial, peroneal and gastrocnemius veins when visible. The superficial great saphenous vein was also interrogated. Spectral Doppler was utilized to evaluate flow at rest and with distal augmentation maneuvers in the common femoral, femoral and popliteal veins. COMPARISON:  None. FINDINGS: Contralateral Common Femoral Vein: Respiratory phasicity is normal and symmetric with the symptomatic side. No evidence of thrombus. Normal compressibility. Common Femoral Vein: No evidence of thrombus. Normal compressibility, respiratory phasicity and response to augmentation. Saphenofemoral Junction: No evidence of thrombus. Normal compressibility and flow on color  Doppler imaging. Profunda Femoral Vein: No evidence of thrombus. Normal compressibility and flow on color Doppler imaging. Femoral Vein: No evidence of thrombus. Normal compressibility, respiratory phasicity and response to augmentation. Popliteal Vein: No evidence of thrombus. Normal compressibility, respiratory phasicity and response to augmentation. Calf Veins: No evidence of thrombus. Normal compressibility and flow on color Doppler imaging. Superficial Great Saphenous Vein: No evidence of thrombus. Normal compressibility. Venous Reflux:  None. Other Findings:  None. IMPRESSION: No evidence of deep venous thrombosis. Electronically Signed   By: Myles Rosenthal M.D.   On: 08/27/2018 09:36   Dg Chest Port 1 View  Result Date: 08/26/2018 CLINICAL DATA:  Sepsis EXAM: PORTABLE CHEST 1 VIEW COMPARISON:  08/16/2018 chest radiograph FINDINGS: The cardiomediastinal silhouette is unremarkable. There is no evidence of focal airspace disease, pulmonary edema, suspicious pulmonary nodule/mass, pleural effusion, or pneumothorax. No acute bony abnormalities are identified. Remote RIGHT rib fractures. IMPRESSION: No active disease. Electronically Signed   By: Harmon Pier M.D.   On: 08/26/2018 16:41   [place] Ascites (abdomen Limited)  Result Date: 08/18/2018 CLINICAL DATA:  Cirrhosis. EXAM: LIMITED ABDOMEN ULTRASOUND FOR ASCITES TECHNIQUE: Limited ultrasound survey for ascites was performed in all four abdominal quadrants. COMPARISON:  None. FINDINGS: Survey of the abdominal cavity demonstrates no visible ascites. IMPRESSION: No ascites identified in the peritoneal cavity by ultrasound. Electronically Signed   By: Irish Lack M.D.   On: 08/18/2018 16:40   [place] Ekg Site Rite  Result Date: 09/04/2018 If Site Rite image not attached, placement could not be confirmed due to current cardiac rhythm.   Assessment & Plan    Atrial fibrillation-converted back to sinus rhythm on amiodarone.  We will continue with p.o.  amiodarone.  Hold chronic anticoagulation at present given comorbidities.  Bacteremia-on long-term antibiotics.  Infected right knee-plan for irrigation of this today.  Patient is optimized from a cardiac standpoint for this procedure.  Signed, Darlin Priestly Shandie Bertz MD 09/05/2018, 7:55 AM  Pager: [phone]

## 2018-09-05 NOTE — Progress Notes (Signed)
Patient returned to floor from surgery s/p R knee I&D.  Resting comfortably at this time.  R knee wrapped with hemovac in place.  Positive pedal pulse noted.

## 2018-09-05 NOTE — Plan of Care (Signed)
Pain relieved with prn meds.  

## 2018-09-05 NOTE — Progress Notes (Signed)
Pharmacy Antibiotic Note  Larry Andrade. is a 58 y.o. male admitted on 08/26/2018 with sepsis.  Patient has knee wound with pus, septic arthritis with MRSA in synovial fluid and blood cultures (2/29).  Pharmacy has been consulted for Vancomycin dosing.  Plan: 03/10 peak 28 mcg/mL; trough 14 mcg/mL w/ vanc 1250 mg IV q24h. Ke 0.0362 T1/2 19.1 Cmax 30 Cmin 13.3 Current AUC 494.7   AUC WNL no changes needed will continue to monitor.    However, based on assessing several levels, pt doesn't clear vancomycin as expected from renal function - Vancomycin was held for 2 doses while waiting for trough to reach acceptable level for redosing  3/3 1707 last dose 1000mg  (at 1g q12) 3/3 2046 Vanc peak 38 3/4 0507 Vanc trough 31 (redrew in case error) 3/4 0941 Vanc trough 28 (didn't appear to be error) 3/5 0414 Vanc random 15  Running Vancomycin 2 Level PK Calculator  Ke = 0.0295, t 1/2 = 23.5 hr  Will check vanc random 3/6 @ 2000 to assure clearing, consider dosing at 1250mg  q 24h if vancomycin clearing appropriately  3/6:  Vanc random @ 2019 = 11 mcg/mL    Will order Vancomycin 1250 mg IV Q24H to start on 3/6 @ 2100. Will order Vanc peak 90 minutes after 4 th dose on 3/10 @ 0030 and draw trough 30 minutes before 5th dose on 3/10 @ 2030.   Recent Labs  Lab 08/30/18 0941 08/31/18 0414 09/01/18 0510 09/01/18 2019 09/02/18 0534 09/02/18 1849 09/03/18 0552 09/04/18 0840 09/05/18 0045 09/05/18 1953  WBC  --  21.7* 22.8*  --  23.5*  --  23.0* 24.9* 21.8*  --   CREATININE  --  1.07 0.99  --  0.93 1.05 1.00 1.09 1.14  --   VANCOTROUGH 28*  --   --   --   --   --   --   --   --  14*  VANCOPEAK  --   --   --   --   --   --   --   --  28*  --   VANCORANDOM  --  15  --  11  --   --   --   --   --   --     Estimated Creatinine Clearance: 82.7 mL/min (by C-G formula based on SCr of 1.14 mg/dL).    Allergies  Allergen Reactions  . Penicillins Rash    Has patient had a PCN reaction  causing immediate rash, facial/tongue/throat swelling, SOB or lightheadedness with hypotension: No Has patient had a PCN reaction causing severe rash involving mucus membranes or skin necrosis: No Has patient had a PCN reaction that required hospitalization: No Has patient had a PCN reaction occurring within the last 10 years: No If all of the above answers are "NO", then may proceed with Cephalosporin use.     Antimicrobials this admission:   >>    >>   Dose adjustments this admission:   Microbiology results:  BCx:   UCx:    Sputum:    MRSA PCR:   Thank you for allowing pharmacy to be a part of this patient's care.  Larry Andrade 09/05/2018 10:30 PM

## 2018-09-05 NOTE — Op Note (Signed)
09/05/2018  6:20 PM  PATIENT:  Larry Schaible Jr.    PRE-OPERATIVE DIAGNOSIS:  Septic right knee joint with gout  POST-OPERATIVE DIAGNOSIS:  Same  PROCEDURE:  OPEN IRRIGATION AND DEBRIDEMENT RIGHT KNEE  SURGEON:   , MD  ANESTHESIA:   General  PREOPERATIVE INDICATIONS:  [name] is a  57 y.o. male with a diagnosis of infected right knee who failed conservative measures and elected for surgical management.    I discussed the risks and benefits of surgery. The risks include but are not limited to infection, bleeding, nerve or blood vessel injury, joint stiffness or loss of motion, persistent pain, weakness or instability and the need for further surgery. Medical risks include but are not limited to DVT and pulmonary embolism, myocardial infarction, stroke, pneumonia, respiratory failure and death. Patient understood these risks and wished to proceed.   OPERATIVE FINDINGS: Crystalline deposits throughout the synovium and bone surfaces likely consistent with gout.  Cloudy synovial fluid.  OPERATIVE PROCEDURE: Patient was met in the preoperative area.  His right knee was marked with the word yes and my initials according the hospital's correct site of surgery protocol.  Brought to the operating room.  Is placed supine on the operative table.  He underwent general anesthesia.  A bump was placed under his right hip.  A tourniquet was applied to his right thigh.  He was prepped and draped in a sterile fashion.  A timeout was performed to confirm the correct site of surgery and the correct procedure to be performed.  Once all in attendance were in agreement the case began.  Patient was not due for his vancomycin yet and antibiotics were not administered.  Sutures from the patient's previous arthroscopic I&D were removed.  The iodoform wick from his bedside I&D on Saturday it was also removed.  A linear incision was made over the anterior knee.  Caseous tear was encountered in the  prepatellar bursa.  This was cultured with swabs and sent to the lab.  This caseous material was debrided.  A medial parapatellar arthrotomy was then made.  Cloudy synovial fluid was encountered and also cultured with swabs.  The bone surfaces of the femur and tibia and patella had calcifications on the articular surfaces.  There is diffuse wear of the cartilage.  The synovium was also covered with calcifications.  An extensive synovectomy was performed to remove these crystalline deposits.  The synovium was sent to pathology for identification.  9 L of GU infused saline was then used to irrigate thoroughly the right knee joint.  Hemovac drain was then placed and brought out laterally.  The incision from his bedside I&D was closed with 3-0 nylon.    The medial arthrotomy was closed with interrupted 0 PDS.  The subcutaneous tissue was closed with 2-0 PDS.  It was approximated with staples.  Hemovac drain was started.  A dry sterile dressing was applied.  Patient was then awoken and brought to the PACU in stable condition.  I scrubbed and present for the entire case and all sharp and instrument counts were correct at the conclusion the case.  I spoke with the patient's wife by phone to let her know the patient was stable in the recovery room.    

## 2018-09-05 NOTE — Anesthesia Preprocedure Evaluation (Signed)
Anesthesia Evaluation  Patient identified by MRN, date of birth, ID band Patient awake    Reviewed: Allergy & Precautions, H&P , NPO status , Patient's Chart, lab work & pertinent test results, reviewed documented beta blocker date and time   Airway Mallampati: II  TM Distance: >3 FB Neck ROM: full    Dental  (+) Teeth Intact   Pulmonary neg pulmonary ROS, Current Smoker,    Pulmonary exam normal        Cardiovascular Exercise Tolerance: Poor hypertension, On Medications +CHF  + dysrhythmias Atrial Fibrillation  Rate:Normal     Neuro/Psych negative neurological ROS  negative psych ROS   GI/Hepatic Neg liver ROS, GERD  Medicated,  Endo/Other  negative endocrine ROS  Renal/GU negative Renal ROS  negative genitourinary   Musculoskeletal   Abdominal   Peds  Hematology  (+) Blood dyscrasia, anemia ,   Anesthesia Other Findings   Reproductive/Obstetrics negative OB ROS                             Anesthesia Physical Anesthesia Plan  ASA: III and emergent  Anesthesia Plan: General LMA   Post-op Pain Management:    Induction:   PONV Risk Score and Plan:   Airway Management Planned:   Additional Equipment:   Intra-op Plan:   Post-operative Plan:   Informed Consent: I have reviewed the patients History and Physical, chart, labs and discussed the procedure including the risks, benefits and alternatives for the proposed anesthesia with the patient or authorized representative who has indicated his/her understanding and acceptance.       Plan Discussed with: CRNA  Anesthesia Plan Comments:         Anesthesia Quick Evaluation

## 2018-09-06 LAB — BASIC METABOLIC PANEL
ANION GAP: 10 (ref 5–15)
BUN: 22 mg/dL — ABNORMAL HIGH (ref 6–20)
CO2: 29 mmol/L (ref 22–32)
Calcium: 8 mg/dL — ABNORMAL LOW (ref 8.9–10.3)
Chloride: 96 mmol/L — ABNORMAL LOW (ref 98–111)
Creatinine, Ser: 1.08 mg/dL (ref 0.61–1.24)
GFR calc Af Amer: >60 mL/min (ref >60)
GFR calc non Af Amer: >60 mL/min (ref >60)
Glucose, Bld: 117 mg/dL — ABNORMAL HIGH (ref 70–99)
Potassium: 3.9 mmol/L (ref 3.5–5.1)
Sodium: 135 mmol/L (ref 135–145)

## 2018-09-06 LAB — CBC
HCT: 24.7 % — ABNORMAL LOW (ref 39.0–52.0)
Hemoglobin: 7.9 g/dL — ABNORMAL LOW (ref 13.0–17.0)
MCH: 32.4 pg (ref 26.0–34.0)
MCHC: 32 g/dL (ref 30.0–36.0)
MCV: 101.2 fL — ABNORMAL HIGH (ref 80.0–100.0)
Platelets: 272 10*3/uL (ref 150–400)
RBC: 2.44 MIL/uL — AB (ref 4.22–5.81)
RDW: 20.4 % — ABNORMAL HIGH (ref 11.5–15.5)
WBC: 18.5 10*3/uL — ABNORMAL HIGH (ref 4.0–10.5)
nRBC: 0 % (ref 0.0–0.2)

## 2018-09-06 LAB — AEROBIC/ANAEROBIC CULTURE W GRAM STAIN (SURGICAL/DEEP WOUND)

## 2018-09-06 LAB — AEROBIC/ANAEROBIC CULTURE (SURGICAL/DEEP WOUND)

## 2018-09-06 LAB — MAGNESIUM: MAGNESIUM: 1.7 mg/dL (ref 1.7–2.4)

## 2018-09-06 MED ORDER — ALLOPURINOL 100 MG PO TABS
200.0000 mg | ORAL_TABLET | Freq: Every day | ORAL | Status: DC
  Administered 2018-09-07 – 2018-09-08 (×2): 200 mg via ORAL
  Filled 2018-09-06 (×2): qty 2

## 2018-09-06 MED ORDER — ENOXAPARIN SODIUM 40 MG/0.4ML ~~LOC~~ SOLN
40.0000 mg | SUBCUTANEOUS | Status: DC
  Administered 2018-09-06 – 2018-09-07 (×2): 40 mg via SUBCUTANEOUS
  Filled 2018-09-06 (×2): qty 0.4

## 2018-09-06 NOTE — Progress Notes (Signed)
[employer] [facility] - [place] at [facility]   PATIENT NAME: Larry Andrade    MR#:  [id number]  DATE OF BIRTH:  01-Sep-1960  SUBJECTIVE:  CHIEF COMPLAINT:   Chief Complaint  Patient presents with  . Knee Pain   No new complaints this morning.  Patient had washout with I&D done in the OR by orthopedic physician yesterday.  Has a drain in place to the right knee joint.  No fevers overnight  REVIEW OF SYSTEMS:  Review of Systems  Constitutional: Negative for chills and fever.  HENT: Negative for hearing loss and tinnitus.   Eyes: Negative for blurred vision and double vision.  Respiratory: Negative for cough, sputum production and shortness of breath.   Cardiovascular: Negative for chest pain, palpitations and orthopnea.  Gastrointestinal: Negative for heartburn, nausea and vomiting.  Genitourinary: Negative for dysuria and urgency.  Musculoskeletal: Negative for myalgias and neck pain.       Gradual improvement in right knee swelling  Skin: Negative for itching and rash.  Neurological: Negative for dizziness and headaches.  Psychiatric/Behavioral: Negative for depression and hallucinations.    DRUG ALLERGIES:   Allergies  Allergen Reactions  . Penicillins Rash    Has patient had a PCN reaction causing immediate rash, facial/tongue/throat swelling, SOB or lightheadedness with hypotension: No Has patient had a PCN reaction causing severe rash involving mucus membranes or skin necrosis: No Has patient had a PCN reaction that required hospitalization: No Has patient had a PCN reaction occurring within the last 10 years: No If all of the above answers are "NO", then may proceed with Cephalosporin use.    VITALS:  Blood pressure 99/69, pulse 91, temperature 98.3 F (36.8 C), temperature source Oral, resp. rate 12, height 6\' 1"  (1.854 m), weight 94.3 kg, SpO2 97 %. PHYSICAL EXAMINATION:   Physical Exam  Constitutional: He is oriented to person, place, and time. He  appears well-developed and well-nourished.  HENT:  Head: Normocephalic and atraumatic.  Right Ear: External ear normal.  Mouth/Throat: Oropharynx is clear and moist. No oropharyngeal exudate.  Eyes: Pupils are equal, round, and reactive to light. Conjunctivae and EOM are normal. Right eye exhibits no discharge.  Neck: Normal range of motion. No tracheal deviation present.  Cardiovascular: Normal rate, regular rhythm and normal heart sounds.  Respiratory: Effort normal and breath sounds normal. No stridor. He has no wheezes.  GI: Soft. Bowel sounds are normal. There is no abdominal tenderness.  Musculoskeletal:        General: No tenderness or edema.     Comments: Dressing in place over right knee joint.  Drain in place to the right knee joint status post recent I&D  Neurological: He is alert and oriented to person, place, and time. No cranial nerve deficit.  Skin: Skin is warm. No erythema.  Psychiatric: He has a normal mood and affect. His behavior is normal.     LABORATORY PANEL:  Male CBC Recent Labs  Lab 09/06/18 0414  WBC 18.5*  HGB 7.9*  HCT 24.7*  PLT 272   ------------------------------------------------------------------------------------------------------------------ Chemistries  Recent Labs  Lab 09/01/18 0510  09/06/18 0414  NA 134*   < > 135  K 4.3   < > 3.9  CL 98   < > 96*  CO2 28   < > 29  GLUCOSE 122*   < > 117*  BUN 21*   < > 22*  CREATININE 0.99   < > 1.08  CALCIUM 8.0*   < >  8.0*  MG  --    < > 1.7  AST 41  --   --   ALT 29  --   --   ALKPHOS 243*  --   --   BILITOT 2.0*  --   --    < > = values in this interval not displayed.   RADIOLOGY:  No results found. ASSESSMENT AND PLAN:   58 year old male with EtOH liver cirrhosis, chronic diastolic heart failure with preserved ejection fraction, ongoing tobacco abuse who was discharged from the hospital on2/24/2020with exacerbation of CHF and gout flare,who thenpresentedto the emergency room with  right knee swelling and pain.   Plan: 1. Sepsis from right knee septic joint, resolving: Patient presentedwith tachycardia, tachypnea and leukocytosis Gram stain showing gram-positive cocci, blood culture growing MRSA, synovial fluid cultureS Aureus.   Leukocytosis gradually improving with white blood cell count down to 18,500.  Seen by infectious disease specialist.  Recommendation is to continue vancomycin for 4-6 weeks IV per ID/Dr. Rivka Safer.  S/p R knee arthroscopic I&D withDr. Martha Clan.  Patient had about 120 cc aspirated on 09/01/2018  No evidence of DVT on venous Doppler ultrasound recently.   Lactic acid2.19from 2.37most recently.  Had bedside I&D with about 300 cc of purulent material drained out by orthopedic physician at bedside on 09/03/2018.  Patient had another I&D with washout of the right knee joint on 09/05/2018.  Has drain in place to the right knee joint.  Orthopedic physician planning on taking out the drain tomorrow if minimal drainage Seen by PT recommended SNF.Continue towork with PT as tolerated. 09/01/2018 TEE not performed.  I discussed case with cardiologist Dr. Lady Gary and no plans for TEE at this time.  Recommendation is to complete 4-6 weeks IV abx as already planned PICC line placed on 09/05/2018 Dose of allopurinol increased to 200 mg daily due to findings of crystalline deposits throughout the synovium and bone surfaces on recent washout by tabetic physician.  2. Chronic diastolic heart failure with preserved ejection fraction:No signs of exacerbation at this time. Continue to monitor. ContinuehomeLasix.  3. EtOH abuse with liver cirrhosis: Patient reports his last drink was Wednesday prior to admission CIWA protocoldiscontinued. No agitationx 6 days No ascites on recent abdominal ultrasound.  4.Hypomagnesemia, hypocalcemia:Repleted,   5.Gout:Continue allopurinol and colchicine. Monitor.  Improving  6. Essential  hypertension:Continue metoprolol  7. Anemia Hbgstable 7.9  Macrocytic. Chronic EtOH abuse. 1 u pRBCsgiven prior. Recheck AM labs. Added on folate and B12 check,within normal limits.  8.Tobacco dependence:Patient is encouraged to quit smoking. Counseling was providedand will be a discharge.  9. HUO:[id number] tamsulosin   10. CoughPRNs and breathing treatmentsordered. Monitor clinically. Reassess for acute cardiopulmonary processes/CHF exacerbation as needed.  Given his smoking history (40 pack-year history) and the duration of the cough (months) suspect he has underlyingchroniclung disease,question COPD. Did have a CXR recently, some scarring at the lung base only.Patient should be encouraged tofollow-up outpatient for PFTs withaPCP.  11.  Atrial fibrillation with rapid ventricular response. Heart rate in the 160s.  Patient denies any chest pain.  No shortness of breath. Currently no evidence of alcohol withdrawal clinically. Patient was transferred to the ICU on 09/02/2018 and heart rate remained uncontrolled with Cardizem drip.  Was subsequently started on amiodarone drip.  Patient already transitioned to p.o. amiodarone  At discharge patient should only be on 400 mg twice daily for 1 week followed by 200 mg twice daily for 3 weeks and then 200 mg daily.  I discussed anticoagulation with cardiologist Dr.  Lady Gary.  Recommendation is to have patient follow-up with him in cardiology clinic before anticoagulation will be initiated especially due to current ongoing surgical issues outlined above   DVT prophylaxis; Lovenox   All the records are reviewed and case discussed with Care Management/Social Worker. Management plans discussed with the patient, family and they are in agreement.  CODE STATUS: Full Code  TOTAL TIME TAKING CARE OF THIS PATIENT: 36 minutes.   More than 50% of the time was spent in counseling/coordination of care: YES  POSSIBLE D/C IN 2 DAYS,  DEPENDING ON CLINICAL CONDITION.   Johanny Segers M.D on 09/06/2018 at 1:56 PM  Between 7am to 6pm - Pager - [phone]  After 6pm go to [email or web address] - Scientist, research (life sciences) [facility]  Office  [phone]  CC: Primary care physician; Gildardo Pounds, PA  Note: This dictation was prepared with Dragon dictation along with smaller phrase technology. Any transcriptional errors that result from this process are unintentional.

## 2018-09-06 NOTE — Plan of Care (Signed)
  Problem: Education: Goal: Knowledge of General Education information will improve Description Including pain rating scale, medication(s)/side effects and non-pharmacologic comfort measures Outcome: Progressing   Problem: Pain Managment: Goal: General experience of comfort will improve Outcome: Progressing   Problem: Safety: Goal: Ability to remain free from injury will improve Outcome: Progressing   Problem: Skin Integrity: Goal: Risk for impaired skin integrity will decrease Outcome: Progressing   Problem: Education: Goal: Required Educational Video(s) Outcome: Progressing   Problem: Clinical Measurements: Goal: Postoperative complications will be avoided or minimized Outcome: Progressing   Problem: Skin Integrity: Goal: Demonstration of wound healing without infection will improve Outcome: Progressing

## 2018-09-06 NOTE — Progress Notes (Signed)
   Date of Admission:  08/26/2018  Today 09/06/18    ID: Larry Human. is a 58 y.o. male Active Problems:   Sepsis (HCC)   Malnutrition of moderate degree MRSA bacteremia MRSA rt knee septic arthritis   Subjective: Feeling okay Still has pain rt knee  Medications:  . [START ON 09/07/2018] allopurinol  200 mg Oral Daily  . amiodarone  400 mg Oral BID  . colchicine  0.6 mg Oral Daily  . docusate sodium  100 mg Oral BID  . enoxaparin (LOVENOX) injection  40 mg Subcutaneous Q24H  . feeding supplement (ENSURE ENLIVE)  237 mL Oral BID BM  . folic acid  1 mg Oral Daily  . furosemide  20 mg Oral BID  . lidocaine (PF)  30 mL Infiltration Once  . lidocaine (PF)  60 mL Infiltration Once  . magnesium oxide  400 mg Oral Daily  . metoprolol tartrate  25 mg Oral BID  . multivitamin with minerals  1 tablet Oral Daily  . pantoprazole  40 mg Oral Daily  . sodium chloride flush  10-40 mL Intracatheter Q12H  . tamsulosin  0.4 mg Oral Daily  . thiamine  100 mg Oral Daily   Or  . thiamine  100 mg Intravenous Daily  . cyanocobalamin  1,000 mcg Oral Daily    Objective: Vital signs in last 24 hours: Temp:  [98.3 F (36.8 C)-100.1 F (37.8 C)] 98.5 F (36.9 C) (03/11 1700) Pulse Rate:  [83-103] 96 (03/11 1700) Resp:  [12-20] 16 (03/11 1700) BP: (99-127)/(69-96) 114/75 (03/11 1700) SpO2:  [92 %-97 %] 96 % (03/11 1700) Weight:  [94.3 kg] 94.3 kg (03/11 0138)  PHYSICAL EXAM:  General: Alert, cooperative, no distress, appears stated age.  Lungs:b/l air entry Heart: irregular Abdomen: Soft,  distended.  Extremities: rt knee surgical dressing, ice pack, drain Skin: No rashes or lesions. Or bruising Lymph: Cervical, supraclavicular normal. Neurologic: Grossly non-focal  Lab Results Recent Labs    09/05/18 0045 09/06/18 0414  WBC 21.8* 18.5*  HGB 8.4* 7.9*  HCT 26.0* 24.7*  NA 133* 135  K 3.9 3.9  CL 95* 96*  CO2 29 29  BUN 19 22*  CREATININE 1.14 1.08   Liver Panel No  results for input(s): PROT, ALBUMIN, AST, ALT, ALKPHOS, BILITOT, BILIDIR, IBILI in the last 72 hours. Sedimentation Rate No results for input(s): ESRSEDRATE in the last 72 hours. C-Reactive Protein No results for input(s): CRP in the last 72 hours.  Microbiology: Microbiology: 2/29 [facility] 08/29/18 BC NG 3/10 BC NG 3/6 - synovial fluid- MRSA-vanco MIC < 0.5 Studies/Results: No results found.   Assessment/Plan: MRSA bacteremia with MRSA septic arthritis rt knee- on vancomycin- MIC 0.5 repeat blood cultureNG so far-but persistent MRSA in the Rt knee Is going to knee minimum 6 weeks of IV vanco Keep level therapeutic 15-20   Leucocytosis persisting-  Had aspiration on the rt knee on 3/6 and I/D on 3/8- wash out on 09/05/18 tomorrow  Gout- Management as per primary team  CHF   Anemia  Cirrhosis liver  Discussed the management with patient

## 2018-09-06 NOTE — Progress Notes (Addendum)
Subjective:  POD #1 s/p open I&D of right knee.  Patient reports right knee pain as moderate.  WBC is down today.    Objective:   VITALS:   Vitals:   09/06/18 0138 09/06/18 0219 09/06/18 0326 09/06/18 0835  BP:   105/70 99/69  Pulse:   (!) 103 91  Resp:   20 12  Temp:   100.1 F (37.8 C) 98.3 F (36.8 C)  TempSrc:   Oral Oral  SpO2:  97% 92% 97%  Weight: 94.3 kg     Height:        PHYSICAL EXAM: Right knee:  Hemovac drain in place.  Dressing C/D/I.  + chronic foot edema.  Baseline neuropathy in feet.   LABS  Results for orders placed or performed during the hospital encounter of 08/26/18 (from the past 24 hour(s))  Aerobic/Anaerobic Culture (surgical/deep wound)     Status: None (Preliminary result)   Collection Time: 09/05/18  4:40 PM  Result Value Ref Range   Specimen Description      WOUND RIGHT KNEE Performed at [facility], [address] [place], [place] [address]    Special Requests      NONE Performed at [facility], [address] [place], [place] [address]    Gram Stain      FEW WBC PRESENT, PREDOMINANTLY PMN RARE GRAM POSITIVE COCCI IN CLUSTERS    Culture      TOO YOUNG TO READ Performed at [facility], [address] [place], [place] [address]    Report Status PENDING   Vancomycin, trough     Status: Abnormal   Collection Time: 09/05/18  7:53 PM  Result Value Ref Range   Vancomycin Tr 14 (L) 15 - 20 ug/mL  Basic metabolic panel     Status: Abnormal   Collection Time: 09/06/18  4:14 AM  Result Value Ref Range   Sodium 135 135 - 145 mmol/L   Potassium 3.9 3.5 - 5.1 mmol/L   Chloride 96 (L) 98 - 111 mmol/L   CO2 29 22 - 32 mmol/L   Glucose, Bld 117 (H) 70 - 99 mg/dL   BUN 22 (H) 6 - 20 mg/dL   Creatinine, Ser 4.32 0.61 - 1.24 mg/dL   Calcium 8.0 (L) 8.9 - 10.3 mg/dL   GFR calc non Af Amer >60 >60 mL/min   GFR calc Af Amer >60 >60 mL/min   Anion gap 10 5 - 15  CBC     Status: Abnormal   Collection Time:  09/06/18  4:14 AM  Result Value Ref Range   WBC 18.5 (H) 4.0 - 10.5 K/uL   RBC 2.44 (L) 4.22 - 5.81 MIL/uL   Hemoglobin 7.9 (L) 13.0 - 17.0 g/dL   HCT 76.1 (L) 47.0 - 92.9 %   MCV 101.2 (H) 80.0 - 100.0 fL   MCH 32.4 26.0 - 34.0 pg   MCHC 32.0 30.0 - 36.0 g/dL   RDW 57.4 (H) 73.4 - 03.7 %   Platelets 272 150 - 400 K/uL   nRBC 0.0 0.0 - 0.2 %  Magnesium     Status: None   Collection Time: 09/06/18  4:14 AM  Result Value Ref Range   Magnesium 1.7 1.7 - 2.4 mg/dL    No results found.  Assessment/Plan: 1 Day Post-Op   Active Problems:   Sepsis (HCC)   Malnutrition of moderate degree  Continue IV antibiotics per ID.  Will pull drain tomorrow and change dressings.  Recommend PT evaluation.  Continue right lower extremity elevation and polar care.  Patient will likely be ready for discharge tomorrow from an orthopaedic standpoint.  Continue lovenox.    Juanell Fairly , MD 09/06/2018, 1:42 PM

## 2018-09-06 NOTE — Progress Notes (Signed)
RT to bedside per patient request. Patient states he had a "coughing fit" and became short of breath. Patient has expiratory wheezes and coarse crackles but does not appear in distress. RA SAT at 98%. PRN breathing treatment give, HOB elevated.

## 2018-09-06 NOTE — Plan of Care (Signed)
  Problem: Education: Goal: Knowledge of General Education information will improve Description Including pain rating scale, medication(s)/side effects and non-pharmacologic comfort measures Outcome: Progressing   Problem: Pain Managment: Goal: General experience of comfort will improve Outcome: Progressing   Problem: Safety: Goal: Ability to remain free from injury will improve Outcome: Progressing   Problem: Skin Integrity: Goal: Risk for impaired skin integrity will decrease Outcome: Progressing   Problem: Education: Goal: Required Educational Video(s) Outcome: Progressing   Problem: Clinical Measurements: Goal: Postoperative complications will be avoided or minimized Outcome: Progressing   Problem: Skin Integrity: Goal: Demonstration of wound healing without infection will improve Outcome: Progressing   

## 2018-09-07 LAB — CBC
HCT: 23.3 % — ABNORMAL LOW (ref 39.0–52.0)
Hemoglobin: 7.4 g/dL — ABNORMAL LOW (ref 13.0–17.0)
MCH: 31.9 pg (ref 26.0–34.0)
MCHC: 31.8 g/dL (ref 30.0–36.0)
MCV: 100.4 fL — ABNORMAL HIGH (ref 80.0–100.0)
Platelets: 247 10*3/uL (ref 150–400)
RBC: 2.32 MIL/uL — ABNORMAL LOW (ref 4.22–5.81)
RDW: 20.2 % — ABNORMAL HIGH (ref 11.5–15.5)
WBC: 12.5 10*3/uL — ABNORMAL HIGH (ref 4.0–10.5)
nRBC: 0 % (ref 0.0–0.2)

## 2018-09-07 LAB — BASIC METABOLIC PANEL
Anion gap: 9 (ref 5–15)
BUN: 18 mg/dL (ref 6–20)
CO2: 29 mmol/L (ref 22–32)
Calcium: 8.3 mg/dL — ABNORMAL LOW (ref 8.9–10.3)
Chloride: 97 mmol/L — ABNORMAL LOW (ref 98–111)
Creatinine, Ser: 1.08 mg/dL (ref 0.61–1.24)
GFR calc Af Amer: >60 mL/min (ref >60)
GFR calc non Af Amer: >60 mL/min (ref >60)
Glucose, Bld: 87 mg/dL (ref 70–99)
Potassium: 3.7 mmol/L (ref 3.5–5.1)
Sodium: 135 mmol/L (ref 135–145)

## 2018-09-07 LAB — SURGICAL PATHOLOGY

## 2018-09-07 LAB — MAGNESIUM: Magnesium: 1.9 mg/dL (ref 1.7–2.4)

## 2018-09-07 NOTE — Evaluation (Signed)
Physical Therapy Re-Evaluation Patient Details Name: Larry Andrade. MRN: [id number] DOB: [birth year] Today's Date: 09/07/2018   History of Present Illness  58 y.o. male presenting to hospital 08/26/18 with increased R knee pain and swelling, and weakness; recent hospital stay secondary CHF and gout flare.  Pt admitted with sepsis from R knee septic joint.  R knee aspirated in ED.  S/p R knee arthroscopic I&D with extensive synovectomy 08/26/18.  S/p bedside arthrocentesis 09/01/18.  Transferred to CCU 09/02/18 secondary a-fib with RVR (HR 160's).  S/p I&D R knee joint bedside 09/03/18.  S/p open irrigation and debridement R knee 09/05/18.  New PT orders now received.  PMH includes CHF, gout, htn, EtOH liver cirrhosis.  Clinical Impression  New PT consult received so PT re-evaluation performed.  PT goals/POC reviewed and updated.  Pt able to perform bed mobility with min assist for R LE and then stand from significantly elevated bed with RW (with mod assist).  Pt ambulated a couple feet forward with RW but pt reporting significant R knee pain and requesting back to bed so pt took steps backwards towards bed and then assisted into sitting and then laying back down in bed.  R knee flexion and extension ROM impairments noted.  R knee pain 6-8/10 beginning and end of session but increased with activity.  Recommend STR upon hospital discharge.    Follow Up Recommendations SNF    Equipment Recommendations  Rolling walker with 5" wheels    Recommendations for Other Services       Precautions / Restrictions Precautions Precautions: Fall Restrictions Weight Bearing Restrictions: Yes RLE Weight Bearing: Weight bearing as tolerated      Mobility  Bed Mobility Overal bed mobility: Needs Assistance Bed Mobility: Supine to Sit     Supine to sit: Min assist;HOB elevated Sit to supine: Min assist;HOB elevated   General bed mobility comments: assist for R LE semi-supine to/from sit; increased effort to  perform; use of bed rail; increased time for pt to scoot up in bed on own end of session  Transfers Overall transfer level: Needs assistance Equipment used: Rolling walker (2 wheeled) Transfers: Sit to/from Stand Sit to Stand: Mod assist;From elevated surface         General transfer comment: bed height significantly elevated; vc's for UE/LE placement and transfer technique; assist to initiate and come to full stand  Ambulation/Gait Ambulation/Gait assistance: Min guard Gait Distance (Feet): 4 Feet Assistive device: Rolling walker (2 wheeled) Gait Pattern/deviations: Antalgic;Decreased stance time - right;Step-to pattern Gait velocity: decreased   General Gait Details: decreased stance time R LE; R knee flexed; step to gait pattern; 2 feet forward and then 2 feet backwards with RW (limited d/t R knee pain)  Stairs            Wheelchair Mobility    Modified Rankin (Stroke Patients Only)       Balance Overall balance assessment: Needs assistance Sitting-balance support: No upper extremity supported;Feet supported Sitting balance-Leahy Scale: Good Sitting balance - Comments: steady sitting reaching within BOS   Standing balance support: No upper extremity supported Standing balance-Leahy Scale: Fair Standing balance comment: steady standing using urinal with occasional single UE support for balance                             Pertinent Vitals/Pain Pain Assessment: 0-10 Pain Score: 8  Pain Location: R knee Pain Descriptors / Indicators: Aching;Discomfort;Sore;Operative site guarding Pain Intervention(s): Limited  activity within patient's tolerance;Monitored during session;Premedicated before session;Repositioned;Patient requesting pain meds-RN notified;Other (comment)(polar care applied and activated)  Vitals (HR and O2 on room air) stable and WFL throughout treatment session.    Home Living Family/patient expects to be discharged to:: Private  residence Living Arrangements: Spouse/significant other;Children(wife and son) Available Help at Discharge: Family Type of Home: House Home Access: Stairs to enter Entrance Stairs-Rails: None Entrance Stairs-Number of Steps: 3 Home Layout: One level Home Equipment: Walker - 2 wheels;Crutches      Prior Function Level of Independence: Independent         Comments: Pt uses either axillary crutches or RW as needed for pain (otherwise does not use anything for ambulation).  Takes sponge baths d/t difficulty getting in/out of shower.     Hand Dominance        Extremity/Trunk Assessment   Upper Extremity Assessment Upper Extremity Assessment: (swelling noted L hand (pt reporting from gout); R UE WFL)    Lower Extremity Assessment RLE Deficits / Details: R knee extension ROM grossly 30 degrees short of neutral (able to perform quad set to achieve 20 degrees short of neutral); limited d/t pain; R knee flexion grossly 60 degrees sitting edge of bed (limited d/t pain); fair quad set; at least 3/5 R ankle DF/PF AROM RLE: Unable to fully assess due to pain LLE Deficits / Details: strength and ROM WFL    Cervical / Trunk Assessment Cervical / Trunk Assessment: Normal  Communication   Communication: No difficulties  Cognition Arousal/Alertness: Awake/alert Behavior During Therapy: WFL for tasks assessed/performed Overall Cognitive Status: Within Functional Limits for tasks assessed                                        General Comments General comments (skin integrity, edema, etc.): R knee bandage in place.  Nursing cleared pt for participation in physical therapy.  Pt agreeable to PT session.    Exercises Total Joint Exercises Quad Sets: AROM;Strengthening;Right;10 reps;Supine Heel Slides: AAROM;Strengthening;Right;5 reps;Supine (limited ROM per pt tolerance)   Assessment/Plan    PT Assessment Patient needs continued PT services  PT Problem List Decreased  strength;Decreased range of motion;Decreased activity tolerance;Decreased balance;Decreased mobility;Decreased knowledge of use of DME;Pain;Decreased knowledge of precautions;Decreased skin integrity       PT Treatment Interventions DME instruction;Gait training;Stair training;Functional mobility training;Therapeutic activities;Therapeutic exercise;Balance training;Patient/family education    PT Goals (Current goals can be found in the Care Plan section)  Acute Rehab PT Goals Patient Stated Goal: to decrease R knee pain PT Goal Formulation: With patient Time For Goal Achievement: 09/21/18 Potential to Achieve Goals: Good    Frequency Min 2X/week   Barriers to discharge Decreased caregiver support;Inaccessible home environment      Co-evaluation               AM-PAC PT "6 Clicks" Mobility  Outcome Measure Help needed turning from your back to your side while in a flat bed without using bedrails?: A Little Help needed moving from lying on your back to sitting on the side of a flat bed without using bedrails?: A Little Help needed moving to and from a bed to a chair (including a wheelchair)?: A Lot Help needed standing up from a chair using your arms (e.g., wheelchair or bedside chair)?: A Lot Help needed to walk in hospital room?: A Lot Help needed climbing 3-5 steps with a railing? :  Total 6 Click Score: 13    End of Session Equipment Utilized During Treatment: Gait belt Activity Tolerance: Patient limited by pain Patient left: in bed;with call bell/phone within reach;with bed alarm set;Other (comment)(R LE elevated and positioned to promote R knee extension per pt tolerance) Nurse Communication: Mobility status;Patient requests pain meds;Precautions;Weight bearing status PT Visit Diagnosis: Muscle weakness (generalized) (M62.81);Difficulty in walking, not elsewhere classified (R26.2);Pain;Other abnormalities of gait and mobility (R26.89) Pain - Right/Left: Right Pain -  part of body: Knee    Time: 6720-9470 PT Time Calculation (min) (ACUTE ONLY): 50 min   Charges:   PT Evaluation $PT Re-evaluation: 1 Re-eval PT Treatments $Therapeutic Exercise: 8-22 mins $Therapeutic Activity: 8-22 mins       Hendricks Limes, PT 09/07/18, 5:04 PM [phone]

## 2018-09-07 NOTE — Progress Notes (Signed)
[facility] [place] [facility]   PATIENT NAME: Larry KannerJohn Andrade    MR#:  [id number]  DATE OF BIRTH:  09-Oct-1960  SUBJECTIVE:  CHIEF COMPLAINT:   Chief Complaint  Patient presents with  . Knee Pain   No new complaints this morning.  Patient had washout with I&D done in the OR by orthopedic physician recently.  Has a drain in place to the right knee joint.  No fevers overnight  REVIEW OF SYSTEMS:  Review of Systems  Constitutional: Negative for chills and fever.  HENT: Negative for hearing loss and tinnitus.   Eyes: Negative for blurred vision and double vision.  Respiratory: Negative for cough, sputum production and shortness of breath.   Cardiovascular: Negative for chest pain, palpitations and orthopnea.  Gastrointestinal: Negative for heartburn, nausea and vomiting.  Genitourinary: Negative for dysuria and urgency.  Musculoskeletal: Negative for myalgias and neck pain.       Gradual improvement in right knee swelling  Skin: Negative for itching and rash.  Neurological: Negative for dizziness and headaches.  Psychiatric/Behavioral: Negative for depression and hallucinations.    DRUG ALLERGIES:   Allergies  Allergen Reactions  . Penicillins Rash    Has patient had a PCN reaction causing immediate rash, facial/tongue/throat swelling, SOB or lightheadedness with hypotension: No Has patient had a PCN reaction causing severe rash involving mucus membranes or skin necrosis: No Has patient had a PCN reaction that required hospitalization: No Has patient had a PCN reaction occurring within the last 10 years: No If all of the above answers are "NO", then may proceed with Cephalosporin use.    VITALS:  Blood pressure 113/75, pulse 92, temperature 98.6 F (37 C), temperature source Oral, resp. rate 16, height 6\' 1"  (1.854 m), weight 94.3 kg, SpO2 100 %. PHYSICAL EXAMINATION:   Physical Exam  Constitutional: He is oriented to person, place, and time. He  appears well-developed and well-nourished.  HENT:  Head: Normocephalic and atraumatic.  Right Ear: External ear normal.  Mouth/Throat: Oropharynx is clear and moist. No oropharyngeal exudate.  Eyes: Pupils are equal, round, and reactive to light. Conjunctivae and EOM are normal. Right eye exhibits no discharge.  Neck: Normal range of motion. No tracheal deviation present.  Cardiovascular: Normal rate, regular rhythm and normal heart sounds.  Respiratory: Effort normal and breath sounds normal. No stridor. He has no wheezes.  GI: Soft. Bowel sounds are normal. There is no abdominal tenderness.  Musculoskeletal:        General: No tenderness or edema.     Comments: Dressing in place over right knee joint.  Drain in place to the right knee joint status post recent I&D  Neurological: He is alert and oriented to person, place, and time. No cranial nerve deficit.  Skin: Skin is warm. No erythema.  Psychiatric: He has a normal mood and affect. His behavior is normal.     LABORATORY PANEL:  Male CBC Recent Labs  Lab 09/07/18 0526  WBC 12.5*  HGB 7.4*  HCT 23.3*  PLT 247   ------------------------------------------------------------------------------------------------------------------ Chemistries  Recent Labs  Lab 09/01/18 0510  09/07/18 0526  NA 134*   < > 135  K 4.3   < > 3.7  CL 98   < > 97*  CO2 28   < > 29  GLUCOSE 122*   < > 87  BUN 21*   < > 18  CREATININE 0.99   < > 1.08  CALCIUM 8.0*   < >  8.3*  MG  --    < > 1.9  AST 41  --   --   ALT 29  --   --   ALKPHOS 243*  --   --   BILITOT 2.0*  --   --    < > = values in this interval not displayed.   RADIOLOGY:  No results found. ASSESSMENT AND PLAN:   58 year old male with EtOH liver cirrhosis, chronic diastolic heart failure with preserved ejection fraction, ongoing tobacco abuse who was discharged from the hospital on2/24/2020with exacerbation of CHF and gout flare,who thenpresentedto the emergency room with  right knee swelling and pain.   Plan: 1. Sepsis from right knee septic joint, resolving: Patient presentedwith tachycardia, tachypnea and leukocytosis Gram stain showing gram-positive cocci, blood culture growing MRSA, synovial fluid cultureS Aureus.   Leukocytosis gradually improving with white blood cell count down to 18,500.  Seen by infectious disease specialist.  Recommendation is to continue vancomycin for 4-6 weeks IV per ID/Dr. Rivka Safer.  S/p R knee arthroscopic I&D withDr. Martha Clan.  Patient had about 120 cc aspirated on 09/01/2018  No evidence of DVT on venous Doppler ultrasound recently.   Lactic acid2.85from 2.52most recently.  Had bedside I&D with about 300 cc of purulent material drained out by orthopedic physician at bedside on 09/03/2018.  Patient had another I&D with washout of the right knee joint on 09/05/2018.  Has drain in place to the right knee joint.  Orthopedic physician planning on taking out the drain today if minimal drainage Seen by PT recommended SNF.Continue towork with PT as tolerated. 09/01/2018 TEE not performed.  I discussed case with cardiologist Dr. Lady Gary and no plans for TEE at this time.  Recommendation is to complete 4-6 weeks IV abx as already planned PICC line placed on 09/05/2018 Dose of allopurinol increased to 200 mg daily due to findings of crystalline deposits throughout the synovium and bone surfaces on recent washout by tabetic physician.  2. Chronic diastolic heart failure with preserved ejection fraction:No signs of exacerbation at this time. Continue to monitor. ContinuehomeLasix.  3. EtOH abuse with liver cirrhosis: Patient reports his last drink was Wednesday prior to admission CIWA protocoldiscontinued. No agitationx 6 days No ascites on recent abdominal ultrasound.  4.Hypomagnesemia, hypocalcemia:Repleted,   5.Gout:Continue allopurinol and colchicine. Monitor.  Improving  6. Essential  hypertension:Continue metoprolol  7. Anemia Hbgstable 7.9  Macrocytic. Chronic EtOH abuse. 1 u pRBCsgiven prior. Recheck AM labs. Added on folate and B12 check,within normal limits.  8.Tobacco dependence:Patient is encouraged to quit smoking. Counseling was providedand will be a discharge.  9. CNO:[id number] tamsulosin   10. CoughPRNs and breathing treatmentsordered. Monitor clinically. Reassess for acute cardiopulmonary processes/CHF exacerbation as needed.  Given his smoking history (40 pack-year history) and the duration of the cough (months) suspect he has underlyingchroniclung disease,question COPD. Did have a CXR recently, some scarring at the lung base only.Patient should be encouraged tofollow-up outpatient for PFTs withaPCP.  11.  Atrial fibrillation with rapid ventricular response. Heart rate in the 160s.  Patient denies any chest pain.  No shortness of breath. Currently no evidence of alcohol withdrawal clinically. Patient was transferred to the ICU on 09/02/2018 and heart rate remained uncontrolled with Cardizem drip.  Was subsequently started on amiodarone drip.  Patient already transitioned to p.o. amiodarone  At discharge patient should only be on 400 mg twice daily for 1 week followed by 200 mg twice daily for 3 weeks and then 200 mg daily.  I discussed anticoagulation with cardiologist Dr.  Lady Gary.  Recommendation is to have patient follow-up with him in cardiology clinic before anticoagulation will be initiated especially due to current ongoing surgical issues outlined above   DVT prophylaxis; Lovenox  Disposition; anticipate discharge to skilled nursing facility tomorrow after drain is removed from the right knee joint by tabetic physician  All the records are reviewed and case discussed with Care Management/Social Worker. Management plans discussed with the patient, family and they are in agreement.  CODE STATUS: Full Code  TOTAL TIME TAKING CARE  OF THIS PATIENT: 35 minutes.   More than 50% of the time was spent in counseling/coordination of care: YES  POSSIBLE D/C IN 1 DAYS, DEPENDING ON CLINICAL CONDITION.   Marleigh Kaylor M.D on 09/07/2018 at 2:39 PM  Between 7am to 6pm - Pager - [phone]  After 6pm go to [email or web address] - Scientist, research (life sciences) [facility]  Office  [phone]  CC: Primary care physician; Gildardo Pounds, PA  Note: This dictation was prepared with Dragon dictation along with smaller phrase technology. Any transcriptional errors that result from this process are unintentional.

## 2018-09-07 NOTE — Progress Notes (Signed)
  Subjective:  POD #2 s/p right knee I&D.  Patient reports left knee pain as mild to moderate.  Receiving respiratory therapy treatment.    Objective:   VITALS:   Vitals:   09/07/18 0444 09/07/18 0845 09/07/18 1436 09/07/18 1452  BP: 118/80 112/80 113/75   Pulse: 88 90 92   Resp: 18 16 16    Temp: 98.3 F (36.8 C) 98.6 F (37 C)    TempSrc: Oral Oral    SpO2: 97% 96% 100% 98%  Weight: 94.3 kg     Height:        PHYSICAL EXAM: Left knee: Incision with scant sanguinous drainage.  Small effusion.  Incision well approximated.  Hemovac drain was pulled now by me.  Patient with baseline neuropathy in both feet.  Feet are well-perfused.  Patient can flex and extend his toes and ankle.  LABS  Results for orders placed or performed during the hospital encounter of 08/26/18 (from the past 24 hour(s))  CBC     Status: Abnormal   Collection Time: 09/07/18  5:26 AM  Result Value Ref Range   WBC 12.5 (H) 4.0 - 10.5 K/uL   RBC 2.32 (L) 4.22 - 5.81 MIL/uL   Hemoglobin 7.4 (L) 13.0 - 17.0 g/dL   HCT 83.2 (L) 54.9 - 82.6 %   MCV 100.4 (H) 80.0 - 100.0 fL   MCH 31.9 26.0 - 34.0 pg   MCHC 31.8 30.0 - 36.0 g/dL   RDW 41.5 (H) 83.0 - 94.0 %   Platelets 247 150 - 400 K/uL   nRBC 0.0 0.0 - 0.2 %  Magnesium     Status: None   Collection Time: 09/07/18  5:26 AM  Result Value Ref Range   Magnesium 1.9 1.7 - 2.4 mg/dL  Basic metabolic panel     Status: Abnormal   Collection Time: 09/07/18  5:26 AM  Result Value Ref Range   Sodium 135 135 - 145 mmol/L   Potassium 3.7 3.5 - 5.1 mmol/L   Chloride 97 (L) 98 - 111 mmol/L   CO2 29 22 - 32 mmol/L   Glucose, Bld 87 70 - 99 mg/dL   BUN 18 6 - 20 mg/dL   Creatinine, Ser 7.68 0.61 - 1.24 mg/dL   Calcium 8.3 (L) 8.9 - 10.3 mg/dL   GFR calc non Af Amer >60 >60 mL/min   GFR calc Af Amer >60 >60 mL/min   Anion gap 9 5 - 15    No results found.  Assessment/Plan: 2 Days Post-Op   Active Problems:   Sepsis (HCC)   Malnutrition of moderate  degree  Drain removed.  WBC continues to improve.  Hemoglobin 7.4.  Vital signs stable.  Continue to monitor cultures.  Surgical culture from 09/05/18 is growing rare S. Aureus.  Susceptibilities to follow.  Continue PT as patient can tolerate.    Larry Andrade , MD 09/07/2018, 3:12 PM

## 2018-09-07 NOTE — Clinical Social Work Note (Signed)
CSW continuing to follow patient's progress throughout discharge planning.  CSW sent updated PT notes to [facility].  Ervin Knack. Kazimir Hartnett, MSW, LCSW [phone]  09/07/2018 6:06 PM

## 2018-09-08 ENCOUNTER — Encounter: Payer: Self-pay | Admitting: Licensed Clinical Social Worker

## 2018-09-08 LAB — CBC
HCT: 21.8 % — ABNORMAL LOW (ref 39.0–52.0)
Hemoglobin: 7.1 g/dL — ABNORMAL LOW (ref 13.0–17.0)
MCH: 33.2 pg (ref 26.0–34.0)
MCHC: 32.6 g/dL (ref 30.0–36.0)
MCV: 101.9 fL — ABNORMAL HIGH (ref 80.0–100.0)
PLATELETS: 254 10*3/uL (ref 150–400)
RBC: 2.14 MIL/uL — ABNORMAL LOW (ref 4.22–5.81)
RDW: 19.9 % — ABNORMAL HIGH (ref 11.5–15.5)
WBC: 13 10*3/uL — ABNORMAL HIGH (ref 4.0–10.5)
nRBC: 0 % (ref 0.0–0.2)

## 2018-09-08 LAB — BASIC METABOLIC PANEL
Anion gap: 9 (ref 5–15)
BUN: 16 mg/dL (ref 6–20)
CO2: 28 mmol/L (ref 22–32)
Calcium: 8.2 mg/dL — ABNORMAL LOW (ref 8.9–10.3)
Chloride: 96 mmol/L — ABNORMAL LOW (ref 98–111)
Creatinine, Ser: 1.02 mg/dL (ref 0.61–1.24)
GFR calc Af Amer: >60 mL/min (ref >60)
GFR calc non Af Amer: >60 mL/min (ref >60)
Glucose, Bld: 90 mg/dL (ref 70–99)
Potassium: 3.9 mmol/L (ref 3.5–5.1)
SODIUM: 133 mmol/L — AB (ref 135–145)

## 2018-09-08 MED ORDER — AMIODARONE HCL 400 MG PO TABS: ORAL_TABLET | ORAL | 0 refills | Status: DC

## 2018-09-08 MED ORDER — VANCOMYCIN IV (FOR PTA / DISCHARGE USE ONLY): 1250.0000 mg | INTRAVENOUS | 0 refills | Status: AC

## 2018-09-08 MED ORDER — BENZONATATE 100 MG PO CAPS: 100.0000 mg | ORAL_CAPSULE | Freq: Two times a day (BID) | ORAL | 0 refills | Status: DC | PRN

## 2018-09-08 MED ORDER — HYDROCODONE-ACETAMINOPHEN 7.5-325 MG PO TABS: 1.0000 | ORAL_TABLET | ORAL | 0 refills | Status: DC | PRN

## 2018-09-08 MED ORDER — ENSURE ENLIVE PO LIQD: 237.0000 mL | Freq: Two times a day (BID) | ORAL | 0 refills | Status: DC

## 2018-09-08 MED ORDER — FUROSEMIDE 20 MG PO TABS: 20.0000 mg | ORAL_TABLET | Freq: Two times a day (BID) | ORAL | 0 refills | Status: DC

## 2018-09-08 MED ORDER — ALLOPURINOL 100 MG PO TABS: 200.0000 mg | ORAL_TABLET | Freq: Every day | ORAL | 0 refills | Status: DC

## 2018-09-08 NOTE — Discharge Summary (Signed)
[facility] NAME: Larry Andrade    MR#:  [id number]  DATE OF BIRTH:  03-17-1961  DATE OF ADMISSION:  08/26/2018   ADMITTING PHYSICIAN: Bettey Costa, MD  DATE OF DISCHARGE: No discharge date for patient encounter.  PRIMARY CARE PHYSICIAN: August Luz A, PA   ADMISSION DIAGNOSIS:  Edema [R60.9] Knee pain [M25.569] Sepsis ([facility]) [A41.9] DISCHARGE DIAGNOSIS:  Active Problems:   Sepsis ([place])   Malnutrition of moderate degree  SECONDARY DIAGNOSIS:   Past Medical History:  Diagnosis Date   CHF (congestive heart failure) (HCC)    Cirrhosis (HCC)    GERD (gastroesophageal reflux disease)    Gout    Hypertension    Pancreatitis    HOSPITAL COURSE:  Chief complaint; right knee pain  History of presenting complaint; Larry Andrade  is a 58 y.o. male with a known history of chronic diastolic heart failure with preserved ejection fraction (echocardiogram August 17, 2018 shows normal ejection fraction with no LVH however old echocardiogram 2015 does show LVH), or cirrhosis due to EtOH and recent diagnosis of gout who was discharged from the hospital on February 24 with diagnosis of acute on chronic heart failure and gout flare who presented to the emergency room due to above issue.  During last hospitalization patient was also complaining of right knee pain and had his knee joint tapped.  Cultures from that were negative however he was diagnosed with gout.  He was seen by rheumatology at that time.  He was prescribed colchicine and allopurinol upon discharge.  He presents due to fever, malaise and edema of right leg. ER physician tapped the right knee and Gram stain is showing gram-positive cocci.  He has been started on antibiotics.  Was admitted to the hospital service.  Please refer to the H&P dictated for further details.  Hospital course; 1. Sepsis from right knee septic joint, resolving: Patient presentedwith tachycardia,  tachypnea and leukocytosis Gram stain showing gram-positive cocci, blood culture growing MRSA, synovial fluid cultureS Aureus.   Leukocytosis gradually improving with white blood cell count down to 13000. Seen by infectious disease specialist.  Recommendation is to continue vancomycin for4-6 weeks IV per ID/Dr. Delaine Lame which will be completed on 10/17/2018.S/p R knee arthroscopic I&D withDr. Mack Guise.  Patient had about 120 cc aspirated on 09/01/2018 No evidence of DVT on venous Doppler ultrasound recently.  Lactic acid2.14fom 2.565mo recently. Had bedside I&D with about 300 cc of purulent material drained out by orthopedic physician at bedside on 09/03/2018.  Patient had another I&D with washout of the right knee joint on 09/05/2018.  Has drain in place to the right knee joint.  Drainage from the right knee already removed by orthopedic physician. 09/01/2018 TEE not performed.  I discussed case with cardiologist Dr. FaUbaldo Glassingnd no plans for TEE at this time.  Recommendation is to complete 4-6 weeks IV abx as already planned PICC line placed on 09/05/2018. Dose of allopurinol increased to 200 mg daily due to findings of crystalline deposits throughout the synovium and bone surfaces on recent washout by tabetic physician. Patient to follow-up with infectious disease specialist in 4 weeks.  Follow-up with orthopedic surgeon as scheduled.  PICC line to be discontinued on completion of IV antibiotics at the skilled nursing facility.  2. Chronic diastolic heart failure with preserved ejection fraction:No signs of exacerbation at this time.  Continue Lasix  3. EtOH abuse with liver cirrhosis: Patient reports his last drink was Wednesday prior  to admission CIWA protocoldiscontinued. No agitation No ascites on recent abdominal ultrasound.  4.Hypomagnesemia, hypocalcemia:Repleted,   5.Gout:Continue allopurinol and colchicine. Monitor.  Improving  6. Essential hypertension:Continue  metoprolol  7. Anemia Hbgremains fairly stable.   Macrocytic. Chronic EtOH abuse. 1 u pRBCsgiven prior. Marland Kitchen  8.Tobacco dependence:Patient is encouraged to quit smoking. Counseling was providedand will be a discharge.  9. IYM:EBRAXENM tamsulosin   10. CoughPRNs and breathing treatmentsordered. Monitor clinically. Reassess for acute cardiopulmonary processes/CHF exacerbation as needed.  Given his smoking history(40 pack-year history)and the duration of the cough (months)suspect he has underlyingchroniclung disease,question COPD. Did have a CXR recently, some scarring at the lung base only.Patient should be encouraged tofollow-up outpatient for PFTs withaPCP.  11.  Atrial fibrillation with rapid ventricular response. Heart rate in the 160s.  Patient denies any chest pain.  No shortness of breath. Currently no evidence of alcohol withdrawal clinically. Patient was transferred to the ICU on 09/02/2018 and heart rate remained uncontrolled with Cardizem drip.  Was subsequently started on amiodarone drip.  Patient already transitioned to p.o. amiodarone At discharge patient should only be on 400 mg twice daily for 1 week followed by 200 mg twice daily for 3 weeks and then 200 mg daily.    I discussed anticoagulation with cardiologist Dr.  Ubaldo Glassing.  Recommendation is to have patient follow-up with him in cardiology clinic before anticoagulation will be initiated especially due to current ongoing surgical issues outlined above    DISCHARGE CONDITIONS:  Patient clinically stable for discharge to skilled nursing facility. CONSULTS OBTAINED:  Treatment Team:  Teodoro Spray, MD DRUG ALLERGIES:   Allergies  Allergen Reactions   Penicillins Rash    Has patient had a PCN reaction causing immediate rash, facial/tongue/throat swelling, SOB or lightheadedness with hypotension: No Has patient had a PCN reaction causing severe rash involving mucus membranes or skin necrosis: No Has  patient had a PCN reaction that required hospitalization: No Has patient had a PCN reaction occurring within the last 10 years: No If all of the above answers are "NO", then may proceed with Cephalosporin use.    DISCHARGE MEDICATIONS:   Allergies as of 09/08/2018      Reactions   Penicillins Rash   Has patient had a PCN reaction causing immediate rash, facial/tongue/throat swelling, SOB or lightheadedness with hypotension: No Has patient had a PCN reaction causing severe rash involving mucus membranes or skin necrosis: No Has patient had a PCN reaction that required hospitalization: No Has patient had a PCN reaction occurring within the last 10 years: No If all of the above answers are "NO", then may proceed with Cephalosporin use.      Medication List    STOP taking these medications   oxyCODONE 5 MG immediate release tablet Commonly known as:  Oxy IR/ROXICODONE     TAKE these medications   albuterol (2.5 MG/3ML) 0.083% nebulizer solution Commonly known as:  PROVENTIL Take 2.5 mg by nebulization every 6 (six) hours as needed for wheezing or shortness of breath.   allopurinol 100 MG tablet Commonly known as:  ZYLOPRIM Take 2 tablets (200 mg total) by mouth daily. Start taking on:  September 09, 2018 What changed:  how much to take   amiodarone 400 MG tablet Commonly known as:  PACERONE Take 400 mg p.o. daily x5 days Then 200 mg p.o. twice daily for 3 weeks and then 200 mg p.o. daily   benzonatate 100 MG capsule Commonly known as:  TESSALON Take 1 capsule (100 mg  total) by mouth 2 (two) times daily as needed for cough.   colchicine 0.6 MG tablet Take 1 tablet (0.6 mg total) by mouth daily.   cyanocobalamin 1000 MCG tablet Take 1 tablet (1,000 mcg total) by mouth daily.   feeding supplement (ENSURE ENLIVE) Liqd Take 237 mLs by mouth 2 (two) times daily between meals.   folic acid 1 MG tablet Commonly known as:  FOLVITE Take 1 tablet (1 mg total) by mouth daily.     furosemide 20 MG tablet Commonly known as:  LASIX Take 1 tablet (20 mg total) by mouth 2 (two) times daily.   HYDROcodone-acetaminophen 7.5-325 MG tablet Commonly known as:  NORCO Take 1-2 tablets by mouth every 4 (four) hours as needed for severe pain (pain score 7-10).   metoprolol tartrate 25 MG tablet Commonly known as:  LOPRESSOR Take 25 mg by mouth 2 (two) times daily.   multivitamin with minerals Tabs tablet Take 1 tablet by mouth daily.   vancomycin  IVPB Inject 1,250 mg into the vein daily. Indication:  MRSA bacteremia and septic knee Last Day of Therapy:  10/17/2018 Labs - Monday:  CBC/D, CMP, and vancomycin trough. Labs - Thursday:  BMP and vancomycin trough Labs - Every other week on Mondays:  ESR and CRP            Home Infusion Instuctions  (From admission, onward)         Start     Ordered   09/08/18 0000  Home infusion instructions    Question:  Instructions  Answer:  Flushing of vascular access device: 0.9% NaCl pre/post medication administration and prn patency; Heparin 100 u/ml, 35m for implanted ports and Heparin 10u/ml, 513mfor all other central venous catheters.   09/08/18 1313           DISCHARGE INSTRUCTIONS:   DIET:  Cardiac diet DISCHARGE CONDITION:  Stable ACTIVITY:  Out of bed with assistance OXYGEN:  Home Oxygen: No.  Oxygen Delivery: room air DISCHARGE LOCATION:  nursing home   If you experience worsening of your admission symptoms, develop shortness of breath, life threatening emergency, suicidal or homicidal thoughts you must seek medical attention immediately by calling 911 or calling your MD immediately  if symptoms less severe.  You Must read complete instructions/literature along with all the possible adverse reactions/side effects for all the Medicines you take and that have been prescribed to you. Take any new Medicines after you have completely understood and accpet all the possible adverse reactions/side effects.    Please note  You were cared for by a hospitalist during your hospital stay. If you have any questions about your discharge medications or the care you received while you were in the hospital after you are discharged, you can call the unit and asked to speak with the hospitalist on call if the hospitalist that took care of you is not available. Once you are discharged, your primary care physician will handle any further medical issues. Please note that NO REFILLS for any discharge medications will be authorized once you are discharged, as it is imperative that you return to your primary care physician (or establish a relationship with a primary care physician if you do not have one) for your aftercare needs so that they can reassess your need for medications and monitor your lab values.    On the day of Discharge:  VITAL SIGNS:  Blood pressure 100/76, pulse 90, temperature 98.4 F (36.9 C), temperature source Oral, resp. rate 20,  height _0  (1.854 m), weight 92.7 kg, SpO2 100 %. PHYSICAL EXAMINATION:  GENERAL:  58 y.o.-year-old patient lying in the bed with no acute distress.  EYES: Pupils equal, round, reactive to light and accommodation. No scleral icterus. Extraocular muscles intact.  HEENT: Head atraumatic, normocephalic. Oropharynx and nasopharynx clear.  NECK:  Supple, no jugular venous distention. No thyroid enlargement, no tenderness.  LUNGS: Normal breath sounds bilaterally, no wheezing, rales,rhonchi or crepitation. No use of accessory muscles of respiration.  CARDIOVASCULAR: S1, S2 normal. No murmurs, rubs, or gallops.  ABDOMEN: Soft, non-tender, non-distended. Bowel sounds present. No organomegaly or mass.  EXTREMITIES: No pedal edema, cyanosis, or clubbing.  NEUROLOGIC: Cranial nerves II through XII are intact. Muscle strength 5/5 in all extremities. Sensation intact. Gait not checked.  PSYCHIATRIC: The patient is alert and oriented x 3.  SKIN: No obvious rash, lesion, or  ulcer.  DATA REVIEW:   CBC Recent Labs  Lab 09/08/18 0500  WBC 13.0*  HGB 7.1*  HCT 21.8*  PLT 254    Chemistries  Recent Labs  Lab 09/07/18 0526 09/08/18 0621  NA 135 133*  K 3.7 3.9  CL 97* 96*  CO2 29 28  GLUCOSE 87 90  BUN 18 16  CREATININE 1.08 1.02  CALCIUM 8.3* 8.2*  MG 1.9  --      Microbiology Results  Results for orders placed or performed during the hospital encounter of 08/26/18  Blood culture (routine x 2)     Status: Abnormal   Collection Time: 08/26/18  3:25 PM  Result Value Ref Range Status   Specimen Description   Final    BLOOD LEFT ANTECUBITAL Performed at [facility], [address], [place], [place] [address]    Special Requests   Final    BOTTLES DRAWN AEROBIC AND ANAEROBIC Blood Culture adequate volume Performed at [facility], [address] [place], [place] [address]    Culture  Setup Time   Final    GRAM POSITIVE COCCI IN BOTH AEROBIC AND ANAEROBIC BOTTLES CRITICAL RESULT CALLED TO, READ BACK BY AND VERIFIED WITH: DAVID BESANTI ON 08/27/2018 AT 0620 QSD Performed at [facility], [address] [place], [place] [address]    Culture (A)  Final    STAPHYLOCOCCUS AUREUS SUSCEPTIBILITIES PERFORMED ON PREVIOUS CULTURE WITHIN THE LAST 5 DAYS. Performed at [facility], [place] [address] [place], [place] [address]    Report Status 08/29/2018 FINAL  Final  Blood culture (routine x 2)     Status: Abnormal   Collection Time: 08/26/18  3:30 PM  Result Value Ref Range Status   Specimen Description   Final    BLOOD BLOOD RIGHT FOREARM Performed at [facility], [address] [place], [place] [address]    Special Requests   Final    BOTTLES DRAWN AEROBIC AND ANAEROBIC Blood Culture results may not be optimal due to an inadequate volume of blood received in culture bottles Performed at [facility], [address] [place], [place] [address]    Culture  Setup Time   Final    GRAM  POSITIVE COCCI IN BOTH AEROBIC AND ANAEROBIC BOTTLES CRITICAL RESULT CALLED TO, READ BACK BY AND VERIFIED WITH: DAVID BESANTI ON 08/27/2018 AT 0998 QSD Performed at [facility], [place] [address] [place],  [address]    Culture METHICILLIN RESISTANT STAPHYLOCOCCUS AUREUS (A)  Final   Report Status 08/29/2018 FINAL  Final   Organism ID, Bacteria METHICILLIN  RESISTANT STAPHYLOCOCCUS AUREUS  Final      Susceptibility   Methicillin resistant staphylococcus aureus - MIC*    CIPROFLOXACIN 1 SENSITIVE Sensitive     ERYTHROMYCIN RESISTANT Resistant     GENTAMICIN <=0.5 SENSITIVE Sensitive     OXACILLIN >=4 RESISTANT Resistant     TETRACYCLINE <=1 SENSITIVE Sensitive     VANCOMYCIN <=0.5 SENSITIVE Sensitive     TRIMETH/SULFA <=10 SENSITIVE Sensitive     CLINDAMYCIN RESISTANT Resistant     RIFAMPIN <=0.5 SENSITIVE Sensitive     Inducible Clindamycin POSITIVE Resistant     * METHICILLIN RESISTANT STAPHYLOCOCCUS AUREUS  Blood Culture ID Panel (Reflexed)     Status: Abnormal   Collection Time: 08/26/18  3:30 PM  Result Value Ref Range Status   Enterococcus species NOT DETECTED NOT DETECTED Final   Listeria monocytogenes NOT DETECTED NOT DETECTED Final   Staphylococcus species DETECTED (A) NOT DETECTED Final    Comment: CRITICAL RESULT CALLED TO, READ BACK BY AND VERIFIED WITH: DAVID BESANTI ON 08/27/2018 AT 0620 QSD    Staphylococcus aureus (BCID) DETECTED (A) NOT DETECTED Final    Comment: Methicillin (oxacillin)-resistant Staphylococcus aureus (MRSA). MRSA is predictably resistant to beta-lactam antibiotics (except ceftaroline). Preferred therapy is vancomycin unless clinically contraindicated. Patient requires contact precautions if  hospitalized. CRITICAL RESULT CALLED TO, READ BACK BY AND VERIFIED WITH: DAVID BESANTI ON 08/27/2018 AT 0620 QSD    Methicillin resistance DETECTED (A) NOT DETECTED Final    Comment: CRITICAL RESULT CALLED TO, READ BACK BY AND VERIFIED WITH: DAVID  BESANTI ON 08/27/2018 AT 0620 QSD    Streptococcus species NOT DETECTED NOT DETECTED Final   Streptococcus agalactiae NOT DETECTED NOT DETECTED Final   Streptococcus pneumoniae NOT DETECTED NOT DETECTED Final   Streptococcus pyogenes NOT DETECTED NOT DETECTED Final   Acinetobacter baumannii NOT DETECTED NOT DETECTED Final   Enterobacteriaceae species NOT DETECTED NOT DETECTED Final   Enterobacter cloacae complex NOT DETECTED NOT DETECTED Final   Escherichia coli NOT DETECTED NOT DETECTED Final   Klebsiella oxytoca NOT DETECTED NOT DETECTED Final   Klebsiella pneumoniae NOT DETECTED NOT DETECTED Final   Proteus species NOT DETECTED NOT DETECTED Final   Serratia marcescens NOT DETECTED NOT DETECTED Final   Haemophilus influenzae NOT DETECTED NOT DETECTED Final   Neisseria meningitidis NOT DETECTED NOT DETECTED Final   Pseudomonas aeruginosa NOT DETECTED NOT DETECTED Final   Candida albicans NOT DETECTED NOT DETECTED Final   Candida glabrata NOT DETECTED NOT DETECTED Final   Candida krusei NOT DETECTED NOT DETECTED Final   Candida parapsilosis NOT DETECTED NOT DETECTED Final   Candida tropicalis NOT DETECTED NOT DETECTED Final    Comment: Performed at [facility], [address] [place], [place] [address]  Body fluid culture     Status: None   Collection Time: 08/26/18  4:19 PM  Result Value Ref Range Status   Specimen Description   Final    KNEE Performed at [facility], [address], [place], [place] [address]    Special Requests   Final    NONE Performed at [facility], [address] [place], [place] [address]    Gram Stain   Final    FEW WBC PRESENT, PREDOMINANTLY PMN ABUNDANT GRAM POSITIVE COCCI IN PAIRS IN CLUSTERS Performed at [facility], [address] [place],  [address]    Culture   Final    ABUNDANT METHICILLIN RESISTANT STAPHYLOCOCCUS AUREUS   Report Status 08/30/2018 FINAL  Final  Organism ID, Bacteria  METHICILLIN RESISTANT STAPHYLOCOCCUS AUREUS  Final      Susceptibility   Methicillin resistant staphylococcus aureus - MIC*    CIPROFLOXACIN 1 SENSITIVE Sensitive     ERYTHROMYCIN RESISTANT Resistant     GENTAMICIN <=0.5 SENSITIVE Sensitive     OXACILLIN >=4 RESISTANT Resistant     TETRACYCLINE <=1 SENSITIVE Sensitive     VANCOMYCIN <=0.5 SENSITIVE Sensitive     TRIMETH/SULFA <=10 SENSITIVE Sensitive     CLINDAMYCIN RESISTANT Resistant     RIFAMPIN <=0.5 SENSITIVE Sensitive     Inducible Clindamycin POSITIVE Resistant     * ABUNDANT METHICILLIN RESISTANT STAPHYLOCOCCUS AUREUS  Urine culture     Status: None   Collection Time: 08/26/18  4:52 PM  Result Value Ref Range Status   Specimen Description   Final    URINE, RANDOM Performed at [facility], [address], [place], [place] [address]    Special Requests   Final    NONE Performed at [facility], [address], [place], [place] [address]    Culture   Final    NO GROWTH Performed at [facility], [place] [address] [place], [place] [address]    Report Status 08/28/2018 FINAL  Final  MRSA PCR Screening     Status: None   Collection Time: 08/26/18  8:31 PM  Result Value Ref Range Status   MRSA by PCR NEGATIVE NEGATIVE Final    Comment:        The GeneXpert MRSA Assay (FDA approved for NASAL specimens only), is one component of a comprehensive MRSA colonization surveillance program. It is not intended to diagnose MRSA infection nor to guide or monitor treatment for MRSA infections. Performed at [facility], [address] [place], [place] [address]   Aerobic/Anaerobic Culture (surgical/deep wound)     Status: None   Collection Time: 08/26/18  9:51 PM  Result Value Ref Range Status   Specimen Description   Final    SYNOVIAL RIGHT KNEE Performed at [facility], [address] [place], [place] [address]    Special Requests   Final    NONE Performed at [facility], [address] [place], [place] [address]    Gram Stain   Final    NO WBC SEEN FEW GRAM POSITIVE COCCI IN PAIRS IN CLUSTERS    Culture   Final    ABUNDANT METHICILLIN RESISTANT STAPHYLOCOCCUS AUREUS NO ANAEROBES ISOLATED Performed at [facility], [place] [address] [place], [place] [address]    Report Status 09/01/2018 FINAL  Final   Organism ID, Bacteria METHICILLIN RESISTANT STAPHYLOCOCCUS AUREUS  Final      Susceptibility   Methicillin resistant staphylococcus aureus - MIC*    CIPROFLOXACIN 2 INTERMEDIATE Intermediate     ERYTHROMYCIN RESISTANT Resistant     GENTAMICIN <=0.5 SENSITIVE Sensitive     OXACILLIN >=4 RESISTANT Resistant     TETRACYCLINE <=1 SENSITIVE Sensitive     VANCOMYCIN <=0.5 SENSITIVE Sensitive     TRIMETH/SULFA <=10 SENSITIVE Sensitive     CLINDAMYCIN RESISTANT Resistant     RIFAMPIN <=0.5 SENSITIVE Sensitive     Inducible Clindamycin POSITIVE Resistant     * ABUNDANT METHICILLIN RESISTANT STAPHYLOCOCCUS AUREUS  CULTURE, BLOOD (ROUTINE X 2) w Reflex to ID Panel     Status: None   Collection Time: 08/29/18 12:48 AM  Result Value Ref Range Status   Specimen Description BLOOD LEFT ANTECUBITAL  Final   Special  Requests   Final    BOTTLES DRAWN AEROBIC AND ANAEROBIC Blood Culture results may not be optimal due to an excessive volume of blood received in culture bottles   Culture   Final    NO GROWTH 5 DAYS Performed at [facility], [address] [place], [place] [address]    Report Status 09/03/2018 FINAL  Final  CULTURE, BLOOD (ROUTINE X 2) w Reflex to ID Panel     Status: None   Collection Time: 08/29/18 12:48 AM  Result Value Ref Range Status   Specimen Description BLOOD BLOOD LEFT HAND  Final   Special Requests   Final    BOTTLES DRAWN AEROBIC AND ANAEROBIC Blood Culture adequate volume   Culture   Final    NO GROWTH 5 DAYS Performed at [facility], [address], [place], [place] [address]    Report  Status 09/03/2018 FINAL  Final  Aerobic/Anaerobic Culture (surgical/deep wound)     Status: None   Collection Time: 09/01/18  2:10 PM  Result Value Ref Range Status   Specimen Description KNEE RIGHT  Final   Special Requests NONE  Final   Gram Stain   Final    ABUNDANT WBC PRESENT, PREDOMINANTLY PMN FEW GRAM POSITIVE COCCI    Culture   Final    FEW METHICILLIN RESISTANT STAPHYLOCOCCUS AUREUS NO ANAEROBES ISOLATED Performed at [facility], [place] [address] [place], [place] [address]    Report Status 09/06/2018 FINAL  Final   Organism ID, Bacteria METHICILLIN RESISTANT STAPHYLOCOCCUS AUREUS  Final      Susceptibility   Methicillin resistant staphylococcus aureus - MIC*    CIPROFLOXACIN 1 SENSITIVE Sensitive     ERYTHROMYCIN RESISTANT Resistant     GENTAMICIN <=0.5 SENSITIVE Sensitive     OXACILLIN >=4 RESISTANT Resistant     TETRACYCLINE <=1 SENSITIVE Sensitive     VANCOMYCIN <=0.5 SENSITIVE Sensitive     TRIMETH/SULFA <=10 SENSITIVE Sensitive     CLINDAMYCIN RESISTANT Resistant     RIFAMPIN <=0.5 SENSITIVE Sensitive     Inducible Clindamycin POSITIVE Resistant     * FEW METHICILLIN RESISTANT STAPHYLOCOCCUS AUREUS  Aerobic Culture (superficial specimen)     Status: None   Collection Time: 09/01/18  2:53 PM  Result Value Ref Range Status   Specimen Description   Final    WOUND RIGHT ELBOW Performed at [facility], [address] [place], [place] [address]    Special Requests   Final    NONE Performed at [facility], [address] [place], [place] [address]    Gram Stain   Final    RARE WBC PRESENT,BOTH PMN AND MONONUCLEAR NO ORGANISMS SEEN    Culture   Final    RARE NORMAL SKIN FLORA Performed at [facility], [place] [address] [place], [place] [address]    Report Status 09/05/2018 FINAL  Final  MRSA PCR Screening     Status: None   Collection Time: 09/02/18  6:38 PM  Result Value Ref Range Status   MRSA by PCR NEGATIVE NEGATIVE  Final    Comment:        The GeneXpert MRSA Assay (FDA approved for NASAL specimens only), is one component of a comprehensive MRSA colonization surveillance program. It is not intended to diagnose MRSA infection nor to guide or monitor treatment for MRSA infections. Performed at [facility], [address], [place], [place] [address]   CULTURE, BLOOD (ROUTINE X 2) w  Reflex to ID Panel     Status: None (Preliminary result)   Collection Time: 09/05/18 12:44 AM  Result Value Ref Range Status   Specimen Description BLOOD BLOOD LEFT HAND  Final   Special Requests   Final    BOTTLES DRAWN AEROBIC AND ANAEROBIC Blood Culture adequate volume   Culture   Final    NO GROWTH 3 DAYS Performed at [facility], [address], [place], [place] [address]    Report Status PENDING  Incomplete  CULTURE, BLOOD (ROUTINE X 2) w Reflex to ID Panel     Status: None (Preliminary result)   Collection Time: 09/05/18 12:46 AM  Result Value Ref Range Status   Specimen Description BLOOD BLOOD LEFT FOREARM  Final   Special Requests   Final    BOTTLES DRAWN AEROBIC AND ANAEROBIC Blood Culture adequate volume   Culture   Final    NO GROWTH 3 DAYS Performed at [facility], [address], [place], [place] [address]    Report Status PENDING  Incomplete  Fungus Culture With Stain     Status: None (Preliminary result)   Collection Time: 09/05/18  4:39 PM  Result Value Ref Range Status   Fungus Stain Final report  Final    Comment: (NOTE) Performed At: [facility] [place], [place] [id number] Rush Farmer MD [id number]    Fungus (Mycology) Culture PENDING  Incomplete   Fungal Source WOUND  Final    Comment: RIGHT KNEE Performed at [facility], [address] [place], [place] [address]   Fungus Culture Result     Status: None   Collection Time: 09/05/18  4:39 PM  Result Value Ref Range Status   Result 1 Comment  Final    Comment:  (NOTE) KOH/Calcofluor preparation:  no fungus observed. Performed At: [facility] [place], [place] [id number] Rush Farmer MD [id number]   Aerobic/Anaerobic Culture (surgical/deep wound)     Status: None (Preliminary result)   Collection Time: 09/05/18  4:40 PM  Result Value Ref Range Status   Specimen Description   Final    WOUND RIGHT KNEE Performed at [facility], [address] [place], [place] [address]    Special Requests   Final    NONE Performed at [facility], [address] [place], [place] [address]    Gram Stain   Final    FEW WBC PRESENT, PREDOMINANTLY PMN RARE GRAM POSITIVE COCCI IN CLUSTERS Performed at [facility], [place] [address], [place], [place] [address]    Culture   Final    RARE STAPHYLOCOCCUS AUREUS SUSCEPTIBILITIES TO FOLLOW NO ANAEROBES ISOLATED; CULTURE IN PROGRESS FOR 5 DAYS    Report Status PENDING  Incomplete    RADIOLOGY:  No results found.   Management plans discussed with the patient, family and they are in agreement.  CODE STATUS: Full Code   TOTAL TIME TAKING CARE OF THIS PATIENT: 45 minutes.    Staci Dack M.D on 09/08/2018 at 1:15 PM  Between 7am to 6pm - Pager - [phone]  After 6pm go to [email or web address] - Technical brewer [facility]  Office  [phone]  CC: Primary care physician; Rutherford Limerick, PA   Note: This dictation was prepared with Dragon dictation along with smaller phrase technology. Any transcriptional errors that result from this process are unintentional.

## 2018-09-08 NOTE — Plan of Care (Signed)
  Problem: Pain Managment: Goal: General experience of comfort will improve Outcome: Progressing   Problem: Safety: Goal: Ability to remain free from injury will improve Outcome: Progressing   

## 2018-09-08 NOTE — Progress Notes (Signed)
  Date of Admission:  08/26/2018      ID: Larry Gurrieri. is a 58 y.o. male  Active Problems:   Sepsis (HCC)   Malnutrition of moderate degree MRSA bacteremia MRSA rt septic arthritis   Subjective: Feeling better Less rt knee pain  Medications:  . allopurinol  200 mg Oral Daily  . amiodarone  400 mg Oral BID  . colchicine  0.6 mg Oral Daily  . enoxaparin (LOVENOX) injection  40 mg Subcutaneous Q24H  . feeding supplement (ENSURE ENLIVE)  237 mL Oral BID BM  . folic acid  1 mg Oral Daily  . furosemide  20 mg Oral BID  . lidocaine (PF)  30 mL Infiltration Once  . lidocaine (PF)  60 mL Infiltration Once  . magnesium oxide  400 mg Oral Daily  . metoprolol tartrate  25 mg Oral BID  . multivitamin with minerals  1 tablet Oral Daily  . pantoprazole  40 mg Oral Daily  . sodium chloride flush  10-40 mL Intracatheter Q12H  . tamsulosin  0.4 mg Oral Daily  . thiamine  100 mg Oral Daily   Or  . thiamine  100 mg Intravenous Daily  . cyanocobalamin  1,000 mcg Oral Daily    Objective: Vital signs in last 24 hours: Temp:  [98.4 F (36.9 C)-99.3 F (37.4 C)] 98.4 F (36.9 C) (03/13 0833) Pulse Rate:  [90-101] 90 (03/13 0833) Resp:  [16-20] 20 (03/13 0833) BP: (100-114)/(73-78) 100/76 (03/13 0833) SpO2:  [98 %-100 %] 100 % (03/13 0833) Weight:  [92.7 kg] 92.7 kg (03/13 0337)  PHYSICAL EXAM:  General: Alert, cooperative, no distress, appears stated age.  Back: No CVA tenderness. Lungs: Clear to auscultation bilaterally. No Wheezing or Rhonchi. No rales. Heart: irrgeular Abdomen: Soft, Extremities: rt knee surgical dressing Changes related to gout on the fingers Skin: No rashes or lesions. Or bruising Lymph: Cervical, supraclavicular normal. Neurologic: Grossly non-focal  Lab Results Recent Labs    09/07/18 0526 09/08/18 0500 09/08/18 0621  WBC 12.5* 13.0*  --   HGB 7.4* 7.1*  --   HCT 23.3* 21.8*  --   NA 135  --  133*  K 3.7  --  3.9  CL 97*  --  96*  CO2 29  --   28  BUN 18  --  16  CREATININE 1.08  --  1.02   Liver Panel No results for input(s): PROT, ALBUMIN, AST, ALT, ALKPHOS, BILITOT, BILIDIR, IBILI in the last 72 hours. Sedimentation Rate No results for input(s): ESRSEDRATE in the last 72 hours. C-Reactive Protein No results for input(s): CRP in the last 72 hours.   Microbiology: 2/29 BC -MRSA 08/29/18 BC NG 3/10 BC NG 3/6 - synovial fluid- MRSA-vanco MIC < 0.5 Studies/Results: No results found.   Assessment/Plan: MRSA bacteremia with MRSA septic arthritis rt knee- on vancomycin- MIC 0.5 repeat blood cultureNG  Is going to knee minimum 6 weeks of IV vanco until 10/17/18 Keep level therapeutic 15-20 TEE could not be done but the 6 weeks will treat endocarditis if present as well  Leucocytosis resolving after Wash out on 09/05/18   Gout- Management as per primary team  CHF   Anemia  Cirrhosis liver  Discussed the management with patient and hospitalist

## 2018-09-08 NOTE — Care Management Important Message (Signed)
Important Message  Patient Details  Name: Larry Andrade. MRN: [id number] Date of Birth: Sep 11, 1960   Medicare Important Message Given:  Yes    Allayne Butcher, RN 09/08/2018, 3:21 PM

## 2018-09-08 NOTE — Progress Notes (Signed)
All signed and held orders that were not released following his I&D process were discontinued after discharge.  Orders that were still relevant to care were released yesterday

## 2018-09-08 NOTE — Progress Notes (Signed)
Pharmacy Antibiotic Note  Larry Andrade. is a 58 y.o. male admitted on 08/26/2018 with sepsis.  Patient has knee wound with pus, septic arthritis with MRSA in synovial fluid and blood cultures (2/29).  Pharmacy has been consulted for Vancomycin dosing.  Plan: 03/10 peak 28 mcg/mL; trough 14 mcg/mL w/ vanc 1250 mg IV q24h. Ke 0.0362 T1/2 19.1 Cmax 30 Cmin 13.3 Current AUC 494.7   AUC WNL no changes needed will continue to monitor.   Recent Labs  Lab 09/01/18 2019  09/04/18 0840 09/05/18 0045 09/05/18 1953 09/06/18 0414 09/07/18 0526 09/08/18 0500 09/08/18 0621  WBC  --    < > 24.9* 21.8*  --  18.5* 12.5* 13.0*  --   CREATININE  --    < > 1.09 1.14  --  1.08 1.08  --  1.02  VANCOTROUGH  --   --   --   --  14*  --   --   --   --   VANCOPEAK  --   --   --  28*  --   --   --   --   --   VANCORANDOM 11  --   --   --   --   --   --   --   --    < > = values in this interval not displayed.    Estimated Creatinine Clearance: 92.5 mL/min (by C-G formula based on SCr of 1.02 mg/dL).    Allergies  Allergen Reactions  . Penicillins Rash    Has patient had a PCN reaction causing immediate rash, facial/tongue/throat swelling, SOB or lightheadedness with hypotension: No Has patient had a PCN reaction causing severe rash involving mucus membranes or skin necrosis: No Has patient had a PCN reaction that required hospitalization: No Has patient had a PCN reaction occurring within the last 10 years: No If all of the above answers are "NO", then may proceed with Cephalosporin use.     Antimicrobials this admission: Vanc 2/29 >>  Cefepime 2/29 >> 3/1  Dose adjustments this admission: 2/29 Started Vanc 1g q 12 3/5 changed to Vanc 1750mg  q 24 3/6 changed to Vanc 1250mg  q 24  Microbiology results: 2/29 BCx: MRSA 4/4 (tetracycline/bactrim sensitive) 2/29 UCx: neg  2/29 WCx: few GPC, abundant Staph 2/29 BFCx: few GPC  2/29 MRSA PCR: neg 3/3 Bcx NG x 5 days  3/6 WCx -rare Staph  Aureus 3/10 Wound Culture - rare Staph Aureus 3/10 Bcx NG x 3 days 3/10 Fungus culture - No fungus observed  Thank you for allowing pharmacy to be a part of this patient's care.  Albina Billet, PharmD, BCPS Clinical Pharmacist 09/08/2018 9:48 AM

## 2018-09-08 NOTE — Anesthesia Postprocedure Evaluation (Signed)
Anesthesia Post Note  Patient: Larry Andrade.  Procedure(s) Performed: IRRIGATION AND DEBRIDEMENT EXTREMITY (Right Knee)  Patient location during evaluation: PACU Anesthesia Type: General Level of consciousness: awake and alert Pain management: pain level controlled Vital Signs Assessment: post-procedure vital signs reviewed and stable Respiratory status: spontaneous breathing, nonlabored ventilation, respiratory function stable and patient connected to nasal cannula oxygen Cardiovascular status: blood pressure returned to baseline and stable Postop Assessment: no apparent nausea or vomiting Anesthetic complications: no     Last Vitals:  Vitals:   09/08/18 0337 09/08/18 0833  BP: 112/78 100/76  Pulse: 96 90  Resp: 20 20  Temp: 37.1 C 36.9 C  SpO2: 98% 100%    Last Pain:  Vitals:   09/08/18 0833  TempSrc: Oral  PainSc:                  Yevette Edwards

## 2018-09-08 NOTE — Progress Notes (Signed)
PHARMACY CONSULT NOTE FOR:  OUTPATIENT  PARENTERAL ANTIBIOTIC THERAPY (OPAT)  Indication: MRSA bacteremia and septic knee Regimen: vancomycin 1250mg  IV q24h End date: 10/17/2018  IV antibiotic discharge orders are pended. To discharging provider:  please sign these orders via discharge navigator,  Select New Orders & click on the button choice - Manage This Unsigned Work.     Thank you for allowing pharmacy to be a part of this patient's care.  Juliette Alcide, PharmD, BCPS.   Work Cell: [phone] 09/08/2018 12:47 PM

## 2018-09-08 NOTE — Progress Notes (Signed)
Report called to [facility].  EMS called for non-emergent transport.  Copy of AVS placed in discharge packet.  Copy given to patient.

## 2018-09-08 NOTE — Treatment Plan (Signed)
Diagnosis: MRSA bacteremia/MRSA rt septic knee Baseline Creatinine 1.08    Allergies  Allergen Reactions  . Penicillins Rash    Has patient had a PCN reaction causing immediate rash, facial/tongue/throat swelling, SOB or lightheadedness with hypotension: No Has patient had a PCN reaction causing severe rash involving mucus membranes or skin necrosis: No Has patient had a PCN reaction that required hospitalization: No Has patient had a PCN reaction occurring within the last 10 years: No If all of the above answers are "NO", then may proceed with Cephalosporin use.     OPAT Orders Discharge antibiotics: Vancomycin 1250 mg IV every 24 hours until 10/17/18 Per pharmacy protocol Aim for Vancomycin trough 15-20 (unless otherwise indicated) Duration: 6 weeks End Date: 10/17/18  [facility] Care Per Protocol:  Labs weekly on Monday while on IV antibiotics: _X_ CBC with differential __ BMP _X_ CMP _ _X_ Vancomycin trough Weekly Thursday Vanco trough  BMP  Every 2 weeks on a monday _ CRP __ ESR_  X_ Please pull PIC at completion of IV antibiotics   Fax weekly labs to [phone]  Clinic Follow Up Appt:4 weeks   Call [phone] to make appt

## 2018-09-08 NOTE — Care Management (Signed)
RNCM consult for medication needs.  CSW notified that patient would need IV antibiotics at discharge

## 2018-09-08 NOTE — Progress Notes (Signed)
EMS is here to transport him to [facility].  Am sending the polar care with him.

## 2018-09-08 NOTE — TOC Transition Note (Signed)
Transition of Care [facility]) - CM/SW Discharge Note   Patient Details  Name: Larry Andrade. MRN: [id number] Date of Birth: 10-Mar-1961  Transition of Care [facility]) CM/SW Contact:  Darleene Cleaver, LCSW Phone Number: 09/08/2018, 6:07 PM   Clinical Narrative:  Patient to be d/c'ed today to [facility] room A14 Bed 2.  Patient and family agreeable to plans will transport via ems RN to call report.  Windell Moulding, MSW, [place] [facility] [phone]        Final next level of care: Skilled Nursing Facility Barriers to Discharge: No Barriers Identified   Patient Goals and CMS Choice Patient states their goals for this hospitalization and ongoing recovery are:: To get some short term rehab and IV antibiotics then return back home. CMS Medicare.gov Compare Post Acute Care list provided to:: Patient Choice offered to / list presented to : Patient  Discharge Placement PASRR number recieved: 09/01/18 Existing PASRR number confirmed : 09/01/18          Patient chooses bed at: [facility] Patient to be transferred to facility by: [facility] Name of family member notified: Patient to notify his mom. Patient and family notified of of transfer: 09/08/18  Discharge Plan and Services Discharge Planning Services: CM Consult                      Social Determinants of Health (SDOH) Interventions     Readmission Risk Interventions No flowsheet data found.

## 2018-09-10 LAB — CULTURE, BLOOD (ROUTINE X 2)
Culture: NO GROWTH
Culture: NO GROWTH
Special Requests: ADEQUATE
Special Requests: ADEQUATE

## 2018-09-10 LAB — AEROBIC/ANAEROBIC CULTURE W GRAM STAIN (SURGICAL/DEEP WOUND)

## 2018-09-18 ENCOUNTER — Other Ambulatory Visit
Admission: RE | Admit: 2018-09-18 | Discharge: 2018-09-18 | Disposition: A | Discharge status: Home / Self Care | Payer: Medicare Other | Source: Ambulatory Visit | Attending: Orthopedic Surgery | Admitting: Orthopedic Surgery

## 2018-09-18 ENCOUNTER — Emergency Department
Admission: EM | Admit: 2018-09-18 | Discharge: 2018-09-19 | Disposition: A | Discharge status: Home / Self Care | Payer: Medicare Other | Attending: Emergency Medicine | Admitting: Emergency Medicine

## 2018-09-18 ENCOUNTER — Encounter: Payer: Self-pay | Admitting: Emergency Medicine

## 2018-09-18 ENCOUNTER — Other Ambulatory Visit: Payer: Self-pay

## 2018-09-18 DIAGNOSIS — M00861 Arthritis due to other bacteria, right knee: Secondary | ICD-10-CM | POA: Insufficient documentation

## 2018-09-18 DIAGNOSIS — I110 Hypertensive heart disease with heart failure: Secondary | ICD-10-CM | POA: Diagnosis not present

## 2018-09-18 DIAGNOSIS — Z79899 Other long term (current) drug therapy: Secondary | ICD-10-CM | POA: Diagnosis not present

## 2018-09-18 DIAGNOSIS — I5022 Chronic systolic (congestive) heart failure: Secondary | ICD-10-CM | POA: Diagnosis not present

## 2018-09-18 DIAGNOSIS — F17210 Nicotine dependence, cigarettes, uncomplicated: Secondary | ICD-10-CM | POA: Insufficient documentation

## 2018-09-18 DIAGNOSIS — D649 Anemia, unspecified: Secondary | ICD-10-CM | POA: Diagnosis present

## 2018-09-18 LAB — TYPE AND SCREEN
ABO/RH(D): AB POS
Antibody Screen: NEGATIVE

## 2018-09-18 LAB — BASIC METABOLIC PANEL
Anion gap: 11 (ref 5–15)
BUN: 17 mg/dL (ref 6–20)
CALCIUM: 8.6 mg/dL — AB (ref 8.9–10.3)
CO2: 25 mmol/L (ref 22–32)
Chloride: 101 mmol/L (ref 98–111)
Creatinine, Ser: 1.47 mg/dL — ABNORMAL HIGH (ref 0.61–1.24)
GFR calc Af Amer: >60 mL/min (ref >60)
GFR calc non Af Amer: 53 mL/min — ABNORMAL LOW (ref >60)
Glucose, Bld: 120 mg/dL — ABNORMAL HIGH (ref 70–99)
Potassium: 3.8 mmol/L (ref 3.5–5.1)
Sodium: 137 mmol/L (ref 135–145)

## 2018-09-18 LAB — CBC WITH DIFFERENTIAL/PLATELET
Abs Immature Granulocytes: 0.24 10*3/uL — ABNORMAL HIGH (ref 0.00–0.07)
Basophils Absolute: 0.2 10*3/uL — ABNORMAL HIGH (ref 0.0–0.1)
Basophils Relative: 1 %
EOS ABS: 0.4 10*3/uL (ref 0.0–0.5)
Eosinophils Relative: 3 %
HCT: 24.8 % — ABNORMAL LOW (ref 39.0–52.0)
Hemoglobin: 7.9 g/dL — ABNORMAL LOW (ref 13.0–17.0)
Immature Granulocytes: 2 %
Lymphocytes Relative: 17 %
Lymphs Abs: 2.2 10*3/uL (ref 0.7–4.0)
MCH: 32.2 pg (ref 26.0–34.0)
MCHC: 31.9 g/dL (ref 30.0–36.0)
MCV: 101.2 fL — ABNORMAL HIGH (ref 80.0–100.0)
Monocytes Absolute: 1.5 10*3/uL — ABNORMAL HIGH (ref 0.1–1.0)
Monocytes Relative: 12 %
Neutro Abs: 8.1 10*3/uL — ABNORMAL HIGH (ref 1.7–7.7)
Neutrophils Relative %: 65 %
Platelets: 296 10*3/uL (ref 150–400)
RBC: 2.45 MIL/uL — ABNORMAL LOW (ref 4.22–5.81)
RDW: 18.5 % — ABNORMAL HIGH (ref 11.5–15.5)
WBC: 12.6 10*3/uL — AB (ref 4.0–10.5)
nRBC: 0 % (ref 0.0–0.2)

## 2018-09-18 LAB — SYNOVIAL CELL COUNT + DIFF, W/ CRYSTALS
Eosinophils-Synovial: 0 %
Lymphocytes-Synovial Fld: 20 %
Monocyte-Macrophage-Synovial Fluid: 0 %
Neutrophil, Synovial: 80 %
WBC, Synovial: 24320 /mm3 — ABNORMAL HIGH (ref 0–200)

## 2018-09-18 NOTE — ED Provider Notes (Signed)
[facility] Emergency Department Provider Note   ____________________________________________   First MD Initiated Contact with Patient 09/18/18 2318     (approximate)  I have reviewed the triage vital signs and the nursing notes.   HISTORY  Chief Complaint Abnormal Lab    HPI Larry Andrade. is a 58 y.o. male patient reportedly sent in for hemoglobin of 6.9.  Patient's hemoglobin is now 7.9.  He is not a candidate for transfusion at this point.  He has no other complaints.  We will send him back.         Past Medical History:  Diagnosis Date  . CHF (congestive heart failure) ([place])   . Cirrhosis ([place])   . GERD (gastroesophageal reflux disease)   . Gout   . Hypertension   . Pancreatitis     Patient Active Problem List   Diagnosis Date Noted  . Malnutrition of moderate degree 09/04/2018  . Sepsis ([place]) 08/26/2018  . Acute on chronic systolic CHF (congestive heart failure) ([place]) 08/17/2018  . Lactic acid acidosis 10/25/2017  . Generalized abdominal pain   . Hematemesis with nausea   . Acute posthemorrhagic anemia   . Gout attack 02/22/2017    Past Surgical History:  Procedure Laterality Date  . CARDIAC CATHETERIZATION    . COLON SURGERY    . ESOPHAGOGASTRODUODENOSCOPY (EGD) WITH PROPOFOL N/A 10/26/2017   Procedure: ESOPHAGOGASTRODUODENOSCOPY (EGD) WITH PROPOFOL;  Surgeon: Jonathon Bellows, MD;  Location: [facility] ENDOSCOPY;  Service: Endoscopy;  Laterality: N/A;  . I&D EXTREMITY Right 09/05/2018   Procedure: IRRIGATION AND DEBRIDEMENT EXTREMITY;  Surgeon: Thornton Park, MD;  Location: [facility] ORS;  Service: Orthopedics;  Laterality: Right;  . KNEE ARTHROSCOPY Right 08/26/2018   Procedure: ARTHROSCOPY KNEE;  Surgeon: Thornton Park, MD;  Location: [facility] ORS;  Service: Orthopedics;  Laterality: Right;    Prior to Admission medications   Medication Sig Start Date End Date Taking? Authorizing Provider  albuterol (PROVENTIL) (2.5 MG/3ML) 0.083%  nebulizer solution Take 2.5 mg by nebulization every 6 (six) hours as needed for wheezing or shortness of breath.    [provider]  allopurinol (ZYLOPRIM) 100 MG tablet Take 2 tablets (200 mg total) by mouth daily. 09/09/18   Stark Jock Jude, MD  amiodarone (PACERONE) 400 MG tablet Take 400 mg p.o. daily x5 days Then 200 mg p.o. twice daily for 3 weeks and then 200 mg p.o. daily 09/08/18   Stark Jock, Jude, MD  benzonatate (TESSALON) 100 MG capsule Take 1 capsule (100 mg total) by mouth 2 (two) times daily as needed for cough. 09/08/18   Stark Jock Jude, MD  colchicine 0.6 MG tablet Take 1 tablet (0.6 mg total) by mouth daily. 08/21/18   Demetrios Loll, MD  feeding supplement, ENSURE ENLIVE, (ENSURE ENLIVE) LIQD Take 237 mLs by mouth 2 (two) times daily between meals. 09/08/18   Stark Jock Jude, MD  folic acid (FOLVITE) 1 MG tablet Take 1 tablet (1 mg total) by mouth daily. 08/21/18   Demetrios Loll, MD  furosemide (LASIX) 20 MG tablet Take 1 tablet (20 mg total) by mouth 2 (two) times daily. 09/08/18   Stark Jock Jude, MD  HYDROcodone-acetaminophen (NORCO) 7.5-325 MG tablet Take 1-2 tablets by mouth every 4 (four) hours as needed for severe pain (pain score 7-10). 09/08/18   Stark Jock, Jude, MD  metoprolol tartrate (LOPRESSOR) 25 MG tablet Take 25 mg by mouth 2 (two) times daily.    [provider]  Multiple Vitamin (MULTIVITAMIN WITH MINERALS) TABS tablet Take 1 tablet by  mouth daily. 10/26/17   Loletha Grayer, MD  vancomycin IVPB Inject 1,250 mg into the vein daily. Indication:  MRSA bacteremia and septic knee Last Day of Therapy:  10/17/2018 Labs - Monday:  CBC/D, CMP, and vancomycin trough. Labs - Thursday:  BMP and vancomycin trough Labs - Every other week on Mondays:  ESR and CRP     09/08/18 10/17/18  Stark Jock, Jude, MD  vitamin B-12 1000 MCG tablet Take 1 tablet (1,000 mcg total) by mouth daily. 08/21/18   Demetrios Loll, MD    Allergies Penicillins  Family History  Problem Relation Age of Onset  . CVA Mother   .  CAD Mother   . CAD Father     Social History Social History   Tobacco Use  . Smoking status: Current Every Day Smoker  . Smokeless tobacco: Never Used  Substance Use Topics  . Alcohol use: Yes    Comment: 1-2 times weekly  . Drug use: No    Review of Systems  Constitutional: No fever/chills Eyes: No visual changes. ENT: No sore throat. Cardiovascular: Denies chest pain. Respiratory: Denies shortness of breath. Gastrointestinal: No abdominal pain.  No nausea, no vomiting.  No diarrhea.  No constipation. Genitourinary: Negative for dysuria. Musculoskeletal: Negative for back pain. Skin: Negative for rash. Neurological: Negative for headaches, focal weakness  ____________________________________________   PHYSICAL EXAM:  VITAL SIGNS: ED Triage Vitals  Enc Vitals Group     BP 09/18/18 2227 114/78     Pulse Rate 09/18/18 2228 95     Resp 09/18/18 2228 18     Temp 09/18/18 2226 98.1 F (36.7 C)     Temp Source 09/18/18 2226 Oral     SpO2 09/18/18 2228 98 %     Weight 09/18/18 2226 205 lb 0.4 oz (93 kg)     Height 09/18/18 2226 6' 1"  (1.854 m)     Head Circumference --      Peak Flow --      Pain Score 09/18/18 2226 6     Pain Loc --      Pain Edu? --      Excl. in [place]? --     Constitutional: Alert and oriented. Well appearing and in no acute distress. Eyes: Conjunctivae are normal.  Head: Atraumatic. Nose: No congestion/rhinnorhea. Mouth/Throat: Mucous membranes are moist.  Oropharynx non-erythematous. Neck: No stridor. Cardiovascular: Normal rate, regular rhythm. Grossly normal heart sounds.  Good peripheral circulation. Respiratory: Normal respiratory effort.  No retractions. Lungs CTAB. Gastrointestinal: Soft and nontender. No distention. No abdominal bruits. No CVA tenderness. Neurologic:  Normal speech and language. No gross focal neurologic deficits are appreciated.. Skin:  Skin is warm, dry and intact. No rash noted.    ____________________________________________   LABS (all labs ordered are listed, but only abnormal results are displayed)  Labs Reviewed  CBC WITH DIFFERENTIAL/PLATELET - Abnormal; Notable for the following components:      Result Value   WBC 12.6 (*)    RBC 2.45 (*)    Hemoglobin 7.9 (*)    HCT 24.8 (*)    MCV 101.2 (*)    RDW 18.5 (*)    Neutro Abs 8.1 (*)    Monocytes Absolute 1.5 (*)    Basophils Absolute 0.2 (*)    Abs Immature Granulocytes 0.24 (*)    All other components within normal limits  BASIC METABOLIC PANEL - Abnormal; Notable for the following components:   Glucose, Bld 120 (*)    Creatinine, Ser 1.47 (*)  Calcium 8.6 (*)    GFR calc non Af Amer 53 (*)    All other components within normal limits  TYPE AND SCREEN   ____________________________________________  EKG   ____________________________________________  RADIOLOGY  ED MD interpretation:    Official radiology report(s): No results found.  ____________________________________________   PROCEDURES  Procedure(s) performed (including Critical Care):  Procedures   ____________________________________________   INITIAL IMPRESSION / ASSESSMENT AND PLAN / ED COURSE  Patient's hemoglobin hematocrit are essentially stable.  I will send him back.            ____________________________________________   FINAL CLINICAL IMPRESSION(S) / ED DIAGNOSES  Final diagnoses:  Mild anemia     ED Discharge Orders    None       Note:  This document was prepared using Dragon voice recognition software and may include unintentional dictation errors.    Nena Polio, MD 09/18/18 [phone]

## 2018-09-18 NOTE — Discharge Instructions (Addendum)
Please continue to follow-up with your doctor.  Return as needed.  Your hemoglobin was 7.9 and your hematocrit was 24.8.  This is essentially stable

## 2018-09-18 NOTE — ED Triage Notes (Signed)
Pt presents to ED via [facility] from [facility]. Sent by MD for hemoglobin 6.7. Pt at [facility] for rehab and IV abt d/t infected knee replacement. PICC line present to R upper arm.

## 2018-09-18 NOTE — ED Notes (Signed)
Full rainbow including type and screen sent to lab. Urine sample collected and sent.

## 2018-09-23 LAB — AEROBIC/ANAEROBIC CULTURE W GRAM STAIN (SURGICAL/DEEP WOUND)

## 2018-09-23 LAB — AEROBIC/ANAEROBIC CULTURE (SURGICAL/DEEP WOUND): CULTURE: NO GROWTH

## 2018-10-03 ENCOUNTER — Other Ambulatory Visit: Payer: Self-pay

## 2018-10-03 ENCOUNTER — Ambulatory Visit: Payer: Medicare Other | Attending: Infectious Diseases | Admitting: Infectious Diseases

## 2018-10-03 DIAGNOSIS — M00061 Staphylococcal arthritis, right knee: Secondary | ICD-10-CM | POA: Diagnosis not present

## 2018-10-03 DIAGNOSIS — R7881 Bacteremia: Secondary | ICD-10-CM | POA: Diagnosis not present

## 2018-10-03 LAB — FUNGAL ORGANISM REFLEX

## 2018-10-03 LAB — FUNGUS CULTURE WITH STAIN

## 2018-10-03 LAB — FUNGUS CULTURE RESULT

## 2018-10-03 NOTE — Progress Notes (Signed)
TELE VISIT  Follow up for MRSA bacteremia and MRSA rt knee septic arthritis - 45yr male pt was discharged from the hospital on 09/08/18  After a 2 week stay.  after I/D of the rt knee . He is on vancomycin for 6 weeks to complete on 10/17/18 He was in NH and now at home. HE is giving the antibiotic daily. No fever or chills No cough or SOB Still has swelling of the rt knee but less painful . Has not seen ortho after discharge from the hospital. He had come to ED on 3/23 for Hb 6.7 sent by the NH MD, as his repeat HB was 7.9 he was sent back without any transfusion- that cvist cr was 1.4 ( on discharge the cr was 1.08)  O/e thru tele -Pt is awake, alert and oriented, does not look ill, pale the PICC site looks clean , with no erythema or discharge. Rt knee remains swollen- site of surgery scabbing, no erythema or discharge  Impression MRSA bacteremia and MRSA rt septic knee- doing better, continue vanco to complete 6 weeks - until 10/16/08 Need to get labs from he NH and now from [facility]   Need to know vanco trough and his cr Need to know his current HB Told patient that I will call him once I get the results

## 2019-04-06 ENCOUNTER — Emergency Department: Payer: Medicare Other

## 2019-04-06 ENCOUNTER — Encounter: Payer: Self-pay | Admitting: *Deleted

## 2019-04-06 ENCOUNTER — Inpatient Hospital Stay
Admission: EM | Admit: 2019-04-06 | Discharge: 2019-04-09 | DRG: 812 | Disposition: A | Discharge status: Home / Self Care | Payer: Medicare Other | Attending: Internal Medicine | Admitting: Internal Medicine

## 2019-04-06 ENCOUNTER — Other Ambulatory Visit: Payer: Self-pay

## 2019-04-06 DIAGNOSIS — I959 Hypotension, unspecified: Secondary | ICD-10-CM | POA: Diagnosis present

## 2019-04-06 DIAGNOSIS — K7460 Unspecified cirrhosis of liver: Secondary | ICD-10-CM | POA: Diagnosis not present

## 2019-04-06 DIAGNOSIS — I5032 Chronic diastolic (congestive) heart failure: Secondary | ICD-10-CM | POA: Diagnosis present

## 2019-04-06 DIAGNOSIS — Z79891 Long term (current) use of opiate analgesic: Secondary | ICD-10-CM

## 2019-04-06 DIAGNOSIS — K3189 Other diseases of stomach and duodenum: Secondary | ICD-10-CM | POA: Diagnosis present

## 2019-04-06 DIAGNOSIS — Z9049 Acquired absence of other specified parts of digestive tract: Secondary | ICD-10-CM | POA: Diagnosis not present

## 2019-04-06 DIAGNOSIS — K219 Gastro-esophageal reflux disease without esophagitis: Secondary | ICD-10-CM | POA: Diagnosis present

## 2019-04-06 DIAGNOSIS — F17210 Nicotine dependence, cigarettes, uncomplicated: Secondary | ICD-10-CM | POA: Diagnosis present

## 2019-04-06 DIAGNOSIS — I110 Hypertensive heart disease with heart failure: Secondary | ICD-10-CM | POA: Diagnosis present

## 2019-04-06 DIAGNOSIS — A419 Sepsis, unspecified organism: Secondary | ICD-10-CM | POA: Diagnosis not present

## 2019-04-06 DIAGNOSIS — K869 Disease of pancreas, unspecified: Secondary | ICD-10-CM | POA: Diagnosis present

## 2019-04-06 DIAGNOSIS — E876 Hypokalemia: Secondary | ICD-10-CM | POA: Diagnosis present

## 2019-04-06 DIAGNOSIS — D649 Anemia, unspecified: Secondary | ICD-10-CM | POA: Diagnosis present

## 2019-04-06 DIAGNOSIS — Z823 Family history of stroke: Secondary | ICD-10-CM | POA: Diagnosis not present

## 2019-04-06 DIAGNOSIS — Z20828 Contact with and (suspected) exposure to other viral communicable diseases: Secondary | ICD-10-CM | POA: Diagnosis present

## 2019-04-06 DIAGNOSIS — Z8719 Personal history of other diseases of the digestive system: Secondary | ICD-10-CM | POA: Diagnosis not present

## 2019-04-06 DIAGNOSIS — M109 Gout, unspecified: Secondary | ICD-10-CM | POA: Diagnosis present

## 2019-04-06 DIAGNOSIS — D539 Nutritional anemia, unspecified: Secondary | ICD-10-CM | POA: Diagnosis present

## 2019-04-06 DIAGNOSIS — K449 Diaphragmatic hernia without obstruction or gangrene: Secondary | ICD-10-CM | POA: Diagnosis present

## 2019-04-06 DIAGNOSIS — K7030 Alcoholic cirrhosis of liver without ascites: Secondary | ICD-10-CM | POA: Diagnosis present

## 2019-04-06 DIAGNOSIS — R0602 Shortness of breath: Secondary | ICD-10-CM | POA: Diagnosis present

## 2019-04-06 DIAGNOSIS — R000 Tachycardia, unspecified: Secondary | ICD-10-CM | POA: Diagnosis present

## 2019-04-06 DIAGNOSIS — K7031 Alcoholic cirrhosis of liver with ascites: Secondary | ICD-10-CM | POA: Diagnosis not present

## 2019-04-06 DIAGNOSIS — Z79899 Other long term (current) drug therapy: Secondary | ICD-10-CM

## 2019-04-06 DIAGNOSIS — K766 Portal hypertension: Secondary | ICD-10-CM | POA: Diagnosis present

## 2019-04-06 DIAGNOSIS — Z8249 Family history of ischemic heart disease and other diseases of the circulatory system: Secondary | ICD-10-CM

## 2019-04-06 DIAGNOSIS — D500 Iron deficiency anemia secondary to blood loss (chronic): Principal | ICD-10-CM | POA: Diagnosis present

## 2019-04-06 DIAGNOSIS — R6521 Severe sepsis with septic shock: Secondary | ICD-10-CM | POA: Diagnosis not present

## 2019-04-06 DIAGNOSIS — F1010 Alcohol abuse, uncomplicated: Secondary | ICD-10-CM | POA: Diagnosis present

## 2019-04-06 DIAGNOSIS — Z880 Allergy status to penicillin: Secondary | ICD-10-CM

## 2019-04-06 DIAGNOSIS — K7290 Hepatic failure, unspecified without coma: Secondary | ICD-10-CM | POA: Diagnosis not present

## 2019-04-06 LAB — URINALYSIS, COMPLETE (UACMP) WITH MICROSCOPIC
Bacteria, UA: NONE SEEN
Glucose, UA: NEGATIVE mg/dL
Hgb urine dipstick: NEGATIVE
Ketones, ur: NEGATIVE mg/dL
Leukocytes,Ua: NEGATIVE
Nitrite: NEGATIVE
Protein, ur: NEGATIVE mg/dL
Specific Gravity, Urine: 1.019 (ref 1.005–1.030)
pH: 5 (ref 5.0–8.0)

## 2019-04-06 LAB — BASIC METABOLIC PANEL
Anion gap: 25 — ABNORMAL HIGH (ref 5–15)
BUN: 16 mg/dL (ref 6–20)
CO2: 20 mmol/L — ABNORMAL LOW (ref 22–32)
Calcium: 8.5 mg/dL — ABNORMAL LOW (ref 8.9–10.3)
Chloride: 91 mmol/L — ABNORMAL LOW (ref 98–111)
Creatinine, Ser: 0.99 mg/dL (ref 0.61–1.24)
GFR calc Af Amer: >60 mL/min (ref >60)
GFR calc non Af Amer: >60 mL/min (ref >60)
Glucose, Bld: 89 mg/dL (ref 70–99)
Potassium: 3.3 mmol/L — ABNORMAL LOW (ref 3.5–5.1)
Sodium: 136 mmol/L (ref 135–145)

## 2019-04-06 LAB — CBC
HCT: 17.6 % — ABNORMAL LOW (ref 39.0–52.0)
HCT: 15.8 % — ABNORMAL LOW (ref 39.0–52.0)
Hemoglobin: 5.7 g/dL — ABNORMAL LOW (ref 13.0–17.0)
Hemoglobin: 5.1 g/dL — ABNORMAL LOW (ref 13.0–17.0)
MCH: 31.8 pg (ref 26.0–34.0)
MCH: 31.5 pg (ref 26.0–34.0)
MCHC: 32.4 g/dL (ref 30.0–36.0)
MCHC: 32.3 g/dL (ref 30.0–36.0)
MCV: 98.3 fL (ref 80.0–100.0)
MCV: 97.5 fL (ref 80.0–100.0)
Platelets: 141 10*3/uL — ABNORMAL LOW (ref 150–400)
Platelets: 130 10*3/uL — ABNORMAL LOW (ref 150–400)
RBC: 1.79 MIL/uL — ABNORMAL LOW (ref 4.22–5.81)
RBC: 1.62 MIL/uL — ABNORMAL LOW (ref 4.22–5.81)
RDW: 26.6 % — ABNORMAL HIGH (ref 11.5–15.5)
RDW: 26.7 % — ABNORMAL HIGH (ref 11.5–15.5)
WBC: 11.4 10*3/uL — ABNORMAL HIGH (ref 4.0–10.5)
WBC: 10.7 10*3/uL — ABNORMAL HIGH (ref 4.0–10.5)
nRBC: 2.3 % — ABNORMAL HIGH (ref 0.0–0.2)
nRBC: 1.5 % — ABNORMAL HIGH (ref 0.0–0.2)

## 2019-04-06 LAB — HEPATIC FUNCTION PANEL
ALT: 51 U/L — ABNORMAL HIGH (ref 0–44)
AST: 175 U/L — ABNORMAL HIGH (ref 15–41)
Albumin: 2.7 g/dL — ABNORMAL LOW (ref 3.5–5.0)
Alkaline Phosphatase: 296 U/L — ABNORMAL HIGH (ref 38–126)
Bilirubin, Direct: 5.7 mg/dL — ABNORMAL HIGH (ref 0.0–0.2)
Indirect Bilirubin: 4.2 mg/dL — ABNORMAL HIGH (ref 0.3–0.9)
Total Bilirubin: 9.9 mg/dL — ABNORMAL HIGH (ref 0.3–1.2)
Total Protein: 7.4 g/dL (ref 6.5–8.1)

## 2019-04-06 LAB — PREPARE RBC (CROSSMATCH)

## 2019-04-06 LAB — IRON AND TIBC
Iron: 159 ug/dL (ref 45–182)
Saturation Ratios: 90 % — ABNORMAL HIGH (ref 17.9–39.5)
TIBC: 176 ug/dL — ABNORMAL LOW (ref 250–450)
UIBC: 17 ug/dL

## 2019-04-06 LAB — COMPREHENSIVE METABOLIC PANEL
ALT: 49 U/L — ABNORMAL HIGH (ref 0–44)
AST: 169 U/L — ABNORMAL HIGH (ref 15–41)
Albumin: 2.5 g/dL — ABNORMAL LOW (ref 3.5–5.0)
Alkaline Phosphatase: 305 U/L — ABNORMAL HIGH (ref 38–126)
Anion gap: 24 — ABNORMAL HIGH (ref 5–15)
BUN: 18 mg/dL (ref 6–20)
CO2: 22 mmol/L (ref 22–32)
Calcium: 8.2 mg/dL — ABNORMAL LOW (ref 8.9–10.3)
Chloride: 90 mmol/L — ABNORMAL LOW (ref 98–111)
Creatinine, Ser: 1.16 mg/dL (ref 0.61–1.24)
GFR calc Af Amer: >60 mL/min (ref >60)
GFR calc non Af Amer: >60 mL/min (ref >60)
Glucose, Bld: 122 mg/dL — ABNORMAL HIGH (ref 70–99)
Potassium: 3.1 mmol/L — ABNORMAL LOW (ref 3.5–5.1)
Sodium: 136 mmol/L (ref 135–145)
Total Bilirubin: 9.1 mg/dL — ABNORMAL HIGH (ref 0.3–1.2)
Total Protein: 6.9 g/dL (ref 6.5–8.1)

## 2019-04-06 LAB — TROPONIN I (HIGH SENSITIVITY)
Troponin I (High Sensitivity): 7 ng/L (ref <18)
Troponin I (High Sensitivity): 6 ng/L (ref <18)

## 2019-04-06 LAB — HEPATITIS PANEL, ACUTE
HCV Ab: NONREACTIVE
Hep A IgM: NONREACTIVE
Hep B C IgM: NONREACTIVE
Hepatitis B Surface Ag: NONREACTIVE

## 2019-04-06 LAB — FERRITIN: Ferritin: 2400 ng/mL — ABNORMAL HIGH (ref 24–336)

## 2019-04-06 LAB — CK: Total CK: 50 U/L (ref 49–397)

## 2019-04-06 LAB — RETICULOCYTES
Immature Retic Fract: 23.6 % — ABNORMAL HIGH (ref 2.3–15.9)
RBC.: 1.61 MIL/uL — ABNORMAL LOW (ref 4.22–5.81)
Retic Count, Absolute: 66.2 10*3/uL (ref 19.0–186.0)
Retic Ct Pct: 4.1 % — ABNORMAL HIGH (ref 0.4–3.1)

## 2019-04-06 LAB — MRSA PCR SCREENING: MRSA by PCR: POSITIVE — AB

## 2019-04-06 LAB — PHOSPHORUS: Phosphorus: 1.4 mg/dL — ABNORMAL LOW (ref 2.5–4.6)

## 2019-04-06 LAB — MAGNESIUM: Magnesium: 1.2 mg/dL — ABNORMAL LOW (ref 1.7–2.4)

## 2019-04-06 LAB — BRAIN NATRIURETIC PEPTIDE: B Natriuretic Peptide: 182 pg/mL — ABNORMAL HIGH (ref 0.0–100.0)

## 2019-04-06 LAB — FOLATE: Folate: 4.1 ng/mL — ABNORMAL LOW (ref >5.9)

## 2019-04-06 LAB — VITAMIN B12: Vitamin B-12: 997 pg/mL — ABNORMAL HIGH (ref 180–914)

## 2019-04-06 MED ORDER — LORAZEPAM 1 MG PO TABS: 1.0000 mg | ORAL_TABLET | ORAL | Status: DC | PRN

## 2019-04-06 MED ORDER — IPRATROPIUM-ALBUTEROL 0.5-2.5 (3) MG/3ML IN SOLN
3.0000 mL | Freq: Four times a day (QID) | RESPIRATORY_TRACT | Status: DC | PRN
  Administered 2019-04-07 – 2019-04-08 (×2): 3 mL via RESPIRATORY_TRACT
  Filled 2019-04-06 (×2): qty 3

## 2019-04-06 MED ORDER — SODIUM CHLORIDE 0.9 % IV SOLN: 10.0000 mL/h | Freq: Once | INTRAVENOUS | Status: DC

## 2019-04-06 MED ORDER — SODIUM CHLORIDE 0.9 % IV SOLN
250.0000 mL | INTRAVENOUS | Status: DC | PRN
  Administered 2019-04-08: 10:00:00 via INTRAVENOUS

## 2019-04-06 MED ORDER — VITAMIN B-1 100 MG PO TABS
100.0000 mg | ORAL_TABLET | Freq: Every day | ORAL | Status: DC
  Administered 2019-04-06 – 2019-04-07 (×2): 100 mg via ORAL
  Filled 2019-04-06 (×3): qty 1

## 2019-04-06 MED ORDER — THIAMINE HCL 100 MG/ML IJ SOLN: 100.0000 mg | Freq: Every day | INTRAMUSCULAR | Status: DC

## 2019-04-06 MED ORDER — SODIUM CHLORIDE 0.9% FLUSH: 3.0000 mL | INTRAVENOUS | Status: DC | PRN

## 2019-04-06 MED ORDER — COLCHICINE 0.6 MG PO TABS
0.6000 mg | ORAL_TABLET | Freq: Every day | ORAL | Status: DC
  Administered 2019-04-07 – 2019-04-09 (×3): 0.6 mg via ORAL
  Filled 2019-04-06 (×3): qty 1

## 2019-04-06 MED ORDER — AMIODARONE HCL 200 MG PO TABS
400.0000 mg | ORAL_TABLET | Freq: Every day | ORAL | Status: DC
  Administered 2019-04-08: 14:00:00 400 mg via ORAL
  Filled 2019-04-06 (×2): qty 2

## 2019-04-06 MED ORDER — ONDANSETRON HCL 4 MG PO TABS: 4.0000 mg | ORAL_TABLET | Freq: Four times a day (QID) | ORAL | Status: DC | PRN

## 2019-04-06 MED ORDER — OXYCODONE HCL 5 MG PO TABS
5.0000 mg | ORAL_TABLET | ORAL | Status: DC | PRN
  Administered 2019-04-06 – 2019-04-09 (×8): 5 mg via ORAL
  Filled 2019-04-06 (×8): qty 1

## 2019-04-06 MED ORDER — METOPROLOL SUCCINATE ER 25 MG PO TB24
25.0000 mg | ORAL_TABLET | Freq: Two times a day (BID) | ORAL | Status: DC
  Administered 2019-04-06 – 2019-04-08 (×5): 25 mg via ORAL
  Filled 2019-04-06 (×5): qty 1

## 2019-04-06 MED ORDER — ADULT MULTIVITAMIN W/MINERALS CH: 1.0000 | ORAL_TABLET | Freq: Every day | ORAL | Status: DC

## 2019-04-06 MED ORDER — LORAZEPAM 2 MG/ML IJ SOLN: 1.0000 mg | INTRAMUSCULAR | Status: DC | PRN

## 2019-04-06 MED ORDER — IPRATROPIUM-ALBUTEROL 0.5-2.5 (3) MG/3ML IN SOLN
3.0000 mL | Freq: Once | RESPIRATORY_TRACT | Status: AC
  Administered 2019-04-06: 15:00:00 3 mL via RESPIRATORY_TRACT
  Filled 2019-04-06: qty 3

## 2019-04-06 MED ORDER — ONDANSETRON HCL 4 MG/2ML IJ SOLN: 4.0000 mg | Freq: Four times a day (QID) | INTRAMUSCULAR | Status: DC | PRN

## 2019-04-06 MED ORDER — PNEUMOCOCCAL VAC POLYVALENT 25 MCG/0.5ML IJ INJ
0.5000 mL | INJECTION | INTRAMUSCULAR | Status: DC
  Filled 2019-04-06: qty 0.5

## 2019-04-06 MED ORDER — SODIUM CHLORIDE 0.9% FLUSH
3.0000 mL | Freq: Two times a day (BID) | INTRAVENOUS | Status: DC
  Administered 2019-04-06 – 2019-04-09 (×6): 3 mL via INTRAVENOUS

## 2019-04-06 MED ORDER — ADULT MULTIVITAMIN W/MINERALS CH
1.0000 | ORAL_TABLET | Freq: Every day | ORAL | Status: DC
  Administered 2019-04-06 – 2019-04-09 (×3): 1 via ORAL
  Filled 2019-04-06 (×3): qty 1

## 2019-04-06 MED ORDER — AMLODIPINE BESYLATE 5 MG PO TABS
5.0000 mg | ORAL_TABLET | Freq: Every day | ORAL | Status: DC
  Filled 2019-04-06: qty 1

## 2019-04-06 MED ORDER — SODIUM CHLORIDE 0.9% FLUSH
3.0000 mL | Freq: Once | INTRAVENOUS | Status: AC
  Administered 2019-04-06: 3 mL via INTRAVENOUS

## 2019-04-06 MED ORDER — FOLIC ACID 1 MG PO TABS
1.0000 mg | ORAL_TABLET | Freq: Every day | ORAL | Status: DC
  Administered 2019-04-06 – 2019-04-09 (×4): 1 mg via ORAL
  Filled 2019-04-06 (×4): qty 1

## 2019-04-06 MED ORDER — INFLUENZA VAC SPLIT QUAD 0.5 ML IM SUSY
0.5000 mL | PREFILLED_SYRINGE | INTRAMUSCULAR | Status: DC
  Filled 2019-04-06: qty 0.5

## 2019-04-06 MED ORDER — ALLOPURINOL 100 MG PO TABS
200.0000 mg | ORAL_TABLET | Freq: Every day | ORAL | Status: DC
  Administered 2019-04-07 – 2019-04-09 (×3): 200 mg via ORAL
  Filled 2019-04-06 (×3): qty 2

## 2019-04-06 NOTE — ED Notes (Addendum)
Xray and MD at bedside.

## 2019-04-06 NOTE — H&P (Signed)
[employer] [facility] [place] [facility]   PATIENT NAME: Larry Andrade    MR#:  [id number]  DATE OF BIRTH:  Sep 25, 1960  DATE OF ADMISSION:  04/06/2019  PRIMARY CARE PHYSICIAN: Gildardo PoundsWhitten, Robin A, PA   REQUESTING/REFERRING PHYSICIAN:   CHIEF COMPLAINT:   Chief Complaint  Patient presents with  . Shortness of Breath  . Jaundice    HISTORY OF PRESENT ILLNESS: Larry Andrade  is a 58 y.o. male with a known history of liver cirrhosis, congestive heart failure type unknown, GERD, gout, hypertension and previous history of pancreatitis who is presenting to the hospital with complaint of shortness of breath.  Patient states that he has been feeling short of breath for the past 3 days.  He has had productive cough.  He denies any fevers or chills.  Patient in the emergency room noted to have a hemoglobin of 5.7.  And does have a history of chronic anemia.    PAST MEDICAL HISTORY:   Past Medical History:  Diagnosis Date  . CHF (congestive heart failure) (HCC)   . Cirrhosis (HCC)   . GERD (gastroesophageal reflux disease)   . Gout   . Hypertension   . Pancreatitis     PAST SURGICAL HISTORY:  Past Surgical History:  Procedure Laterality Date  . CARDIAC CATHETERIZATION    . COLON SURGERY    . ESOPHAGOGASTRODUODENOSCOPY (EGD) WITH PROPOFOL N/A 10/26/2017   Procedure: ESOPHAGOGASTRODUODENOSCOPY (EGD) WITH PROPOFOL;  Surgeon: Wyline MoodAnna, Kiran, MD;  [facility];  Service: Endoscopy;  Laterality: N/A;  . I&D EXTREMITY Right 09/05/2018   Procedure: IRRIGATION AND DEBRIDEMENT EXTREMITY;  Surgeon: Juanell FairlyKrasinski, Kevin, MD;  Location: [facility] ORS;  Service: Orthopedics;  Laterality: Right;  . KNEE ARTHROSCOPY Right 08/26/2018   Procedure: ARTHROSCOPY KNEE;  Surgeon: Juanell FairlyKrasinski, Kevin, MD;  Location: [facility] ORS;  Service: Orthopedics;  Laterality: Right;    SOCIAL HISTORY:  Social History   Tobacco Use  . Smoking status: Current Every Day Smoker    Packs/day: 1.00    Types: Cigarettes  .  Smokeless tobacco: Never Used  Substance Use Topics  . Alcohol use: Yes    Comment: 1 pint of vodka a day    FAMILY HISTORY:  Family History  Problem Relation Age of Onset  . CVA Mother   . CAD Mother   . CAD Father     DRUG ALLERGIES:  Allergies  Allergen Reactions  . Penicillins Rash    Has patient had a PCN reaction causing immediate rash, facial/tongue/throat swelling, SOB or lightheadedness with hypotension: No Has patient had a PCN reaction causing severe rash involving mucus membranes or skin necrosis: No Has patient had a PCN reaction that required hospitalization: No Has patient had a PCN reaction occurring within the last 10 years: No If all of the above answers are "NO", then may proceed with Cephalosporin use.     REVIEW OF SYSTEMS:   CONSTITUTIONAL: No fever, fatigue or weakness.  EYES: No blurred or double vision.  EARS, NOSE, AND THROAT: No tinnitus or ear pain.  RESPIRATORY: Positive cough, positive shortness of breath, wheezing or hemoptysis.  CARDIOVASCULAR: No chest pain, orthopnea, edema.  GASTROINTESTINAL: No nausea, vomiting, diarrhea or abdominal pain.  GENITOURINARY: No dysuria, hematuria.  ENDOCRINE: No polyuria, nocturia,  HEMATOLOGY: No anemia, easy bruising or bleeding SKIN: POSITIVE JAUNDICE  MUSCULOSKELETAL: No joint pain or arthritis.   NEUROLOGIC: No tingling, numbness, weakness.  PSYCHIATRY: No anxiety or depression.   MEDICATIONS AT HOME:  Prior to Admission medications  Medication Sig Start Date End Date Taking? Authorizing Provider  allopurinol (ZYLOPRIM) 100 MG tablet Take 2 tablets (200 mg total) by mouth daily. 09/09/18  Yes Ojie, Jude, MD  amiodarone (PACERONE) 400 MG tablet Take 400 mg p.o. daily x5 days Then 200 mg p.o. twice daily for 3 weeks and then 200 mg p.o. daily Patient taking differently: Take 400 mg by mouth daily.  09/08/18  Yes Ojie, Jude, MD  amLODipine (NORVASC) 5 MG tablet Take 5 mg by mouth daily. 01/15/19  Yes  [provider]  colchicine 0.6 MG tablet Take 1 tablet (0.6 mg total) by mouth daily. 08/21/18  Yes Demetrios Loll, MD  HYDROcodone-acetaminophen [facility]) 7.5-325 MG tablet Take 1-2 tablets by mouth every 4 (four) hours as needed for severe pain (pain score 7-10). 09/08/18  Yes Ojie, Jude, MD  metoprolol succinate (TOPROL-XL) 25 MG 24 hr tablet Take 25 mg by mouth 2 (two) times daily. 01/15/19  Yes [provider]  Multiple Vitamin (MULTIVITAMIN WITH MINERALS) TABS tablet Take 1 tablet by mouth daily. 10/26/17  Yes Wieting, Richard, MD  oxyCODONE-acetaminophen (PERCOCET) 7.5-325 MG tablet Take 1 tablet by mouth 3 (three) times daily as needed. 03/23/19  Yes [provider]  albuterol (PROVENTIL) (2.5 MG/3ML) 0.083% nebulizer solution Take 2.5 mg by nebulization every 6 (six) hours as needed for wheezing or shortness of breath.    [provider]  benzonatate (TESSALON) 100 MG capsule Take 1 capsule (100 mg total) by mouth 2 (two) times daily as needed for cough. Patient not taking: Reported on 04/06/2019 09/08/18   Otila Back, MD  feeding supplement, ENSURE ENLIVE, (ENSURE ENLIVE) LIQD Take 237 mLs by mouth 2 (two) times daily between meals. Patient not taking: Reported on 04/06/2019 09/08/18   Otila Back, MD  folic acid (FOLVITE) 1 MG tablet Take 1 tablet (1 mg total) by mouth daily. Patient not taking: Reported on 04/06/2019 08/21/18   Demetrios Loll, MD  furosemide (LASIX) 20 MG tablet Take 1 tablet (20 mg total) by mouth 2 (two) times daily. Patient not taking: Reported on 04/06/2019 09/08/18   Otila Back, MD  vitamin B-12 1000 MCG tablet Take 1 tablet (1,000 mcg total) by mouth daily. Patient not taking: Reported on 04/06/2019 08/21/18   Demetrios Loll, MD      PHYSICAL EXAMINATION:   VITAL SIGNS: Blood pressure 113/80, pulse (!) 105, temperature (!) 97.5 F (36.4 C), temperature source Oral, resp. rate (!) 21, height 6\' 1"  (1.854 m), weight 83 kg, SpO2 100 %.  GENERAL:  58  y.o.-year-old patient lying in the bed with no acute distress.  EYES: Pupils equal, round, reactive to light and accommodation. No scleral icterus. Extraocular muscles intact.  HEENT: Head atraumatic, normocephalic. Oropharynx and nasopharynx clear.  NECK:  Supple, no jugular venous distention. No thyroid enlargement, no tenderness.  LUNGS: Diminished breath sounds bilaterally,  CARDIOVASCULAR: S1, S2 normal. No murmurs, rubs, or gallops.  ABDOMEN: Soft, nontender, nondistended. Bowel sounds present. No organomegaly or mass.  EXTREMITIES: No pedal edema, cyanosis, or clubbing.  NEUROLOGIC: Cranial nerves II through XII are intact. Muscle strength 5/5 in all extremities. Sensation intact. Gait not checked.  PSYCHIATRIC: The patient is alert and oriented x 3.  SKIN: No obvious rash, lesion, or ulcer.  Positive jaundice  LABORATORY PANEL:   CBC Recent Labs  Lab 04/06/19 1244  WBC 11.4*  HGB 5.7*  HCT 17.6*  PLT 141*  MCV 98.3  MCH 31.8  MCHC 32.4  RDW 26.6*   ------------------------------------------------------------------------------------------------------------------  Chemistries  Recent Labs  Lab 04/06/19 1244  NA 136  K 3.3*  CL 91*  CO2 20*  GLUCOSE 89  BUN 16  CREATININE 0.99  CALCIUM 8.5*  AST 175*  ALT 51*  ALKPHOS 296*  BILITOT 9.9*   ------------------------------------------------------------------------------------------------------------------ estimated creatinine clearance is 94.2 mL/min (by C-G formula based on SCr of 0.99 mg/dL). ------------------------------------------------------------------------------------------------------------------ No results for input(s): TSH, T4TOTAL, T3FREE, THYROIDAB in the last 72 hours.  Invalid input(s): FREET3   Coagulation profile No results for input(s): INR, PROTIME in the last 168 hours. ------------------------------------------------------------------------------------------------------------------- No  results for input(s): DDIMER in the last 72 hours. -------------------------------------------------------------------------------------------------------------------  Cardiac Enzymes No results for input(s): CKMB, TROPONINI, MYOGLOBIN in the last 168 hours.  Invalid input(s): CK ------------------------------------------------------------------------------------------------------------------ Invalid input(s): POCBNP  ---------------------------------------------------------------------------------------------------------------  Urinalysis    Component Value Date/Time   COLORURINE AMBER (A) 08/26/2018 1530   APPEARANCEUR CLEAR (A) 08/26/2018 1530   LABSPEC 1.025 08/26/2018 1530   PHURINE 5.0 08/26/2018 1530   GLUCOSEU NEGATIVE 08/26/2018 1530   HGBUR SMALL (A) 08/26/2018 1530   BILIRUBINUR SMALL (A) 08/26/2018 1530   KETONESUR NEGATIVE 08/26/2018 1530   PROTEINUR 30 (A) 08/26/2018 1530   NITRITE NEGATIVE 08/26/2018 1530   LEUKOCYTESUR NEGATIVE 08/26/2018 1530     RADIOLOGY: Dg Chest Portable 1 View  Result Date: 04/06/2019 CLINICAL DATA:  Shortness of breath. EXAM: PORTABLE CHEST 1 VIEW COMPARISON:  Chest x-ray dated 08/26/2018 FINDINGS: The heart size and mediastinal contours are within normal limits. Both lungs are clear. No acute bone abnormality. Old healed right rib fractures. IMPRESSION: No active disease. Electronically Signed   By: Francene Boyers M.D.   On: 04/06/2019 13:14    EKG: Orders placed or performed during the hospital encounter of 04/06/19  . ED EKG  . ED EKG  . EKG 12-Lead  . EKG 12-Lead    IMPRESSION AND PLAN: Patient is 58 year old with history of alcoholic cirrhosis presenting with shortness of breath  1.  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  No overt blood loss noted.  We will guaiac his stools.  Asked GI to see.  We will also order anemia panel. ED physician has already scheduled for him to be  transfused 2 units of packed RBCs.  2.  Elevated LFTs due to alcoholic liver disease.  I will check a hepatitis panel as well GI has been consulted  3.  Chronic diastolic CHF currently compensated use Lasix as needed, monitor fluid ins and outs  4.  Essential hypertension continue amlodipine  5.  Gout continue colchicine  6.  Alcohol abuse placed on CIWA protocol  All the records are reviewed and case discussed with ED provider. Management plans discussed with the patient, family and they are in agreement.  CODE STATUS: Code Status History    Date Active Date Inactive Code Status Order ID Comments User Context   08/26/2018 1724 09/08/2018 2203 Full Code [id number]  Adrian Saran, MD ED   08/17/2018 0430 08/21/2018 1807 Full Code [id number]  Arnaldo Natal, MD ED   10/25/2017 0147 10/26/2017 1717 Full Code [id number]  Cammy Copa, MD Inpatient   02/22/2017 1122 02/24/2017 1413 Full Code [id number]  Alford Highland, MD ED   Advance Care Planning Activity       TOTAL TIME TAKING CARE OF THIS PATIENT:69minutes.    Auburn Bilberry M.D on 04/06/2019 at 3:08 PM  Between 7am to 6pm - Pager - [phone]  After 6pm go to [email or web address] - password  [facility]  [employer] Office  [phone]  CC: Primary care physician; Gildardo Pounds, PA

## 2019-04-06 NOTE — Progress Notes (Signed)
Advanced care plan.  Purpose of the Encounter: CODE STATUS  Parties in Attendance: Patient himself Patient's Decision Capacity: Intact Subjective/Patient's story: Larry Andrade  is a 58 y.o. male with a known history of liver cirrhosis, congestive heart failure type unknown, GERD, gout, hypertension and previous history of pancreatitis who is presenting to the hospital with complaint of shortness of breath.    Objective/Medical story Due to patient's multiple medical issues I wanted to discuss his overall condition and prognosis and discuss his wishes for cardiac and pulmonary resuscitation   Goals of care determination:  Patient states he would like everything to be done wants to be a full code   CODE STATUS: Full code   Time spent discussing advanced care planning: 16 minutes

## 2019-04-06 NOTE — ED Provider Notes (Addendum)
[facility] Emergency Department Provider Note   ____________________________________________   First MD Initiated Contact with Patient 04/06/19 1240     (approximate)  I have reviewed the triage vital signs and the nursing notes.   HISTORY  Chief Complaint Shortness of Breath and Jaundice   HPI Larry HumanJohn Shippee Jr. is a 58 y.o. male patient complains of increasing shortness of breath with exertion for about a week.  He says his skin is getting yellower than usual as well.  He is still drinking.  He is drinking a pint of vodka a day now.  He says he is coughing up clear or white sputum.  He is not having a fever.  He told the triage nurse that hurts when he coughs.  He did not tell me that.   He is not short of breath if he is not having exertion.      Past Medical History:  Diagnosis Date  . CHF (congestive heart failure) (HCC)   . Cirrhosis (HCC)   . GERD (gastroesophageal reflux disease)   . Gout   . Hypertension   . Pancreatitis     Patient Active Problem List   Diagnosis Date Noted  . Malnutrition of moderate degree 09/04/2018  . Sepsis (HCC) 08/26/2018  . Acute on chronic systolic CHF (congestive heart failure) (HCC) 08/17/2018  . Lactic acid acidosis 10/25/2017  . Generalized abdominal pain   . Hematemesis with nausea   . Acute posthemorrhagic anemia   . Gout attack 02/22/2017    Past Surgical History:  Procedure Laterality Date  . CARDIAC CATHETERIZATION    . COLON SURGERY    . ESOPHAGOGASTRODUODENOSCOPY (EGD) WITH PROPOFOL N/A 10/26/2017   Procedure: ESOPHAGOGASTRODUODENOSCOPY (EGD) WITH PROPOFOL;  Surgeon: Wyline MoodAnna, Kiran, MD;  [facility];  Service: Endoscopy;  Laterality: N/A;  . I&D EXTREMITY Right 09/05/2018   Procedure: IRRIGATION AND DEBRIDEMENT EXTREMITY;  Surgeon: Juanell FairlyKrasinski, Kevin, MD;  Location: [facility] ORS;  Service: Orthopedics;  Laterality: Right;  . KNEE ARTHROSCOPY Right 08/26/2018   Procedure: ARTHROSCOPY KNEE;   Surgeon: Juanell FairlyKrasinski, Kevin, MD;  Location: [facility] ORS;  Service: Orthopedics;  Laterality: Right;    Prior to Admission medications   Medication Sig Start Date End Date Taking? Authorizing Provider  allopurinol (ZYLOPRIM) 100 MG tablet Take 2 tablets (200 mg total) by mouth daily. 09/09/18  Yes Ojie, Jude, MD  amiodarone (PACERONE) 400 MG tablet Take 400 mg p.o. daily x5 days Then 200 mg p.o. twice daily for 3 weeks and then 200 mg p.o. daily Patient taking differently: Take 400 mg by mouth daily.  09/08/18  Yes Ojie, Jude, MD  amLODipine (NORVASC) 5 MG tablet Take 5 mg by mouth daily. 01/15/19  Yes [provider]  colchicine 0.6 MG tablet Take 1 tablet (0.6 mg total) by mouth daily. 08/21/18  Yes Shaune Pollackhen, Qing, MD  HYDROcodone-acetaminophen [facility](NORCO) 7.5-325 MG tablet Take 1-2 tablets by mouth every 4 (four) hours as needed for severe pain (pain score 7-10). 09/08/18  Yes Ojie, Jude, MD  metoprolol succinate (TOPROL-XL) 25 MG 24 hr tablet Take 25 mg by mouth 2 (two) times daily. 01/15/19  Yes [provider]  Multiple Vitamin (MULTIVITAMIN WITH MINERALS) TABS tablet Take 1 tablet by mouth daily. 10/26/17  Yes Wieting, Richard, MD  oxyCODONE-acetaminophen (PERCOCET) 7.5-325 MG tablet Take 1 tablet by mouth 3 (three) times daily as needed. 03/23/19  Yes [provider]  albuterol (PROVENTIL) (2.5 MG/3ML) 0.083% nebulizer solution Take 2.5 mg by nebulization every 6 (six)  hours as needed for wheezing or shortness of breath.    [provider]  benzonatate (TESSALON) 100 MG capsule Take 1 capsule (100 mg total) by mouth 2 (two) times daily as needed for cough. Patient not taking: Reported on 04/06/2019 09/08/18   Otila Back, MD  feeding supplement, ENSURE ENLIVE, (ENSURE ENLIVE) LIQD Take 237 mLs by mouth 2 (two) times daily between meals. Patient not taking: Reported on 04/06/2019 09/08/18   Otila Back, MD  folic acid (FOLVITE) 1 MG tablet Take 1 tablet (1 mg total) by mouth daily.  Patient not taking: Reported on 04/06/2019 08/21/18   Demetrios Loll, MD  furosemide (LASIX) 20 MG tablet Take 1 tablet (20 mg total) by mouth 2 (two) times daily. Patient not taking: Reported on 04/06/2019 09/08/18   Otila Back, MD  vitamin B-12 1000 MCG tablet Take 1 tablet (1,000 mcg total) by mouth daily. Patient not taking: Reported on 04/06/2019 08/21/18   Demetrios Loll, MD    Allergies Penicillins  Family History  Problem Relation Age of Onset  . CVA Mother   . CAD Mother   . CAD Father     Social History Social History   Tobacco Use  . Smoking status: Current Every Day Smoker    Packs/day: 1.00    Types: Cigarettes  . Smokeless tobacco: Never Used  Substance Use Topics  . Alcohol use: Yes    Comment: 1 pint of vodka a day  . Drug use: No    Review of Systems  Constitutional: No fever/chills Eyes: No visual changes. ENT: No sore throat. Cardiovascular: Denies chest pain. Respiratory: See HPI Gastrointestinal: No abdominal pain.  No nausea, no vomiting.  No diarrhea.  No constipation. Genitourinary: Negative for dysuria. Musculoskeletal: Negative for back pain. Skin: Negative for rash. Neurological: Negative for headaches, focal weakness  ____________________________________________   PHYSICAL EXAM:  VITAL SIGNS: ED Triage Vitals  Enc Vitals Group     BP 04/06/19 1240 113/80     Pulse Rate 04/06/19 1240 (!) 105     Resp 04/06/19 1240 (!) 21     Temp 04/06/19 1240 (!) 97.5 F (36.4 C)     Temp Source 04/06/19 1240 Oral     SpO2 04/06/19 1235 100 %     Weight 04/06/19 1237 183 lb (83 kg)     Height 04/06/19 1237 6\' 1"  (1.854 m)     Head Circumference --      Peak Flow --      Pain Score 04/06/19 1235 0     Pain Loc --      Pain Edu? --      Excl. in [place]? --     Constitutional: Alert and oriented. Well appearing and in no acute distress. Eyes: Conjunctivae are somewhat jaundiced Head: Atraumatic. Nose: No congestion/rhinnorhea. Mouth/Throat: Mucous  membranes are moist.  Oropharynx non-erythematous. Neck: No stridor. Cardiovascular: Normal rate, regular rhythm. Grossly normal heart sounds.  Good peripheral circulation. Respiratory: Normal respiratory effort.  No retractions. Lungs CTAB. Gastrointestinal: Soft and nontender. No distention. No abdominal bruits. No CVA tenderness. Musculoskeletal: No lower extremity tenderness mild edema.   Neurologic:  Normal speech and language. No gross focal neurologic deficits are appreciated.  Skin:  Skin is warm, dry and intact. No rash noted. Rectal: Normal anus there is a slightest trace of possible Hemoccult positivity in the stool.  ____________________________________________   LABS (all labs ordered are listed, but only abnormal results are displayed)  Labs Reviewed  BASIC METABOLIC PANEL -  Abnormal; Notable for the following components:      Result Value   Potassium 3.3 (*)    Chloride 91 (*)    CO2 20 (*)    Calcium 8.5 (*)    Anion gap 25 (*)    All other components within normal limits  CBC - Abnormal; Notable for the following components:   WBC 11.4 (*)    RBC 1.79 (*)    Hemoglobin 5.7 (*)    HCT 17.6 (*)    RDW 26.6 (*)    Platelets 141 (*)    nRBC 2.3 (*)    All other components within normal limits  HEPATIC FUNCTION PANEL - Abnormal; Notable for the following components:   Albumin 2.7 (*)    AST 175 (*)    ALT 51 (*)    Alkaline Phosphatase 296 (*)    Total Bilirubin 9.9 (*)    Bilirubin, Direct 5.7 (*)    Indirect Bilirubin 4.2 (*)    All other components within normal limits  BRAIN NATRIURETIC PEPTIDE - Abnormal; Notable for the following components:   B Natriuretic Peptide 182.0 (*)    All other components within normal limits  SARS CORONAVIRUS 2 (TAT 6-24 HRS)  TYPE AND SCREEN  PREPARE RBC (CROSSMATCH)  TROPONIN I (HIGH SENSITIVITY)  TROPONIN I (HIGH SENSITIVITY)   ____________________________________________  EKG  EKG read interpreted by me shows  sinus tachycardia rate of 104 normal axis right bundle branch block ST-T segment depression in the anterior septal leads which is old. ____________________________________________  RADIOLOGY  ED MD interpretation:  Official radiology report(s): Dg Chest Portable 1 View  Result Date: 04/06/2019 CLINICAL DATA:  Shortness of breath. EXAM: PORTABLE CHEST 1 VIEW COMPARISON:  Chest x-ray dated 08/26/2018 FINDINGS: The heart size and mediastinal contours are within normal limits. Both lungs are clear. No acute bone abnormality. Old healed right rib fractures. IMPRESSION: No active disease. Electronically Signed   By: Francene Boyers M.D.   On: 04/06/2019 13:14    ____________________________________________   PROCEDURES  Procedure(s) performed (including Critical Care): Critical care time 20 minutes.  This includes evaluating the patient reviewing his old records and lab work and talking to the hospitalist.  It also includes getting consent for the blood transfusion.  Procedures   ____________________________________________   INITIAL IMPRESSION / ASSESSMENT AND PLAN / ED COURSE Carmichael Burdette. was evaluated in Emergency Department on 04/06/2019 for the symptoms described in the history of present illness. He was evaluated in the context of the global COVID-19 pandemic, which necessitated consideration that the patient might be at risk for infection with the SARS-CoV-2 virus that causes COVID-19. Institutional protocols and algorithms that pertain to the evaluation of patients at risk for COVID-19 are in a state of rapid change based on information released by regulatory bodies including the CDC and federal and state organizations. These policies and algorithms were followed during the patient's care in the ED.             ____________________________________________   FINAL CLINICAL IMPRESSION(S) / ED DIAGNOSES  Final diagnoses:  Symptomatic anemia     ED Discharge Orders     None       Note:  This document was prepared using Dragon voice recognition software and may include unintentional dictation errors.    Arnaldo Natal, MD 04/06/19 1451    Arnaldo Natal, MD 04/15/19 1626

## 2019-04-06 NOTE — ED Triage Notes (Addendum)
Pt to ED via EMS reporting SOB and lightheadedness when ambulating x 1 week. Pt also reporting his skin is more yellow than normal. Pts eyes also noted to be yellow upon arrival to ED. Pt admitting to drinking 1 pint of vodka a day. Cough with clear sputum causing chest pain when pt is coughing.

## 2019-04-07 ENCOUNTER — Inpatient Hospital Stay: Payer: Medicare Other

## 2019-04-07 ENCOUNTER — Encounter: Payer: Self-pay | Admitting: Radiology

## 2019-04-07 DIAGNOSIS — D649 Anemia, unspecified: Secondary | ICD-10-CM

## 2019-04-07 DIAGNOSIS — K7030 Alcoholic cirrhosis of liver without ascites: Secondary | ICD-10-CM

## 2019-04-07 LAB — BILIRUBIN, DIRECT: Bilirubin, Direct: 5.2 mg/dL — ABNORMAL HIGH (ref 0.0–0.2)

## 2019-04-07 LAB — BASIC METABOLIC PANEL
Anion gap: 12 (ref 5–15)
BUN: 16 mg/dL (ref 6–20)
CO2: 30 mmol/L (ref 22–32)
Calcium: 7.7 mg/dL — ABNORMAL LOW (ref 8.9–10.3)
Chloride: 92 mmol/L — ABNORMAL LOW (ref 98–111)
Creatinine, Ser: 0.83 mg/dL (ref 0.61–1.24)
GFR calc Af Amer: >60 mL/min (ref >60)
GFR calc non Af Amer: >60 mL/min (ref >60)
Glucose, Bld: 95 mg/dL (ref 70–99)
Potassium: 2.8 mmol/L — ABNORMAL LOW (ref 3.5–5.1)
Sodium: 134 mmol/L — ABNORMAL LOW (ref 135–145)

## 2019-04-07 LAB — OCCULT BLOOD X 1 CARD TO LAB, STOOL: Fecal Occult Bld: NEGATIVE

## 2019-04-07 LAB — CBC
HCT: 18 % — ABNORMAL LOW (ref 39.0–52.0)
HCT: 19.9 % — ABNORMAL LOW (ref 39.0–52.0)
Hemoglobin: 6.3 g/dL — ABNORMAL LOW (ref 13.0–17.0)
Hemoglobin: 6.8 g/dL — ABNORMAL LOW (ref 13.0–17.0)
MCH: 31.2 pg (ref 26.0–34.0)
MCH: 30.8 pg (ref 26.0–34.0)
MCHC: 35 g/dL (ref 30.0–36.0)
MCHC: 34.2 g/dL (ref 30.0–36.0)
MCV: 89.1 fL (ref 80.0–100.0)
MCV: 90 fL (ref 80.0–100.0)
Platelets: 124 10*3/uL — ABNORMAL LOW (ref 150–400)
Platelets: 133 10*3/uL — ABNORMAL LOW (ref 150–400)
RBC: 2.02 MIL/uL — ABNORMAL LOW (ref 4.22–5.81)
RBC: 2.21 MIL/uL — ABNORMAL LOW (ref 4.22–5.81)
RDW: 22.6 % — ABNORMAL HIGH (ref 11.5–15.5)
RDW: 23.2 % — ABNORMAL HIGH (ref 11.5–15.5)
WBC: 8.6 10*3/uL (ref 4.0–10.5)
WBC: 9.1 10*3/uL (ref 4.0–10.5)
nRBC: 0.7 % — ABNORMAL HIGH (ref 0.0–0.2)
nRBC: 1.1 % — ABNORMAL HIGH (ref 0.0–0.2)

## 2019-04-07 LAB — HEMOGLOBIN AND HEMATOCRIT, BLOOD
HCT: 24.1 % — ABNORMAL LOW (ref 39.0–52.0)
Hemoglobin: 8.1 g/dL — ABNORMAL LOW (ref 13.0–17.0)

## 2019-04-07 LAB — SARS CORONAVIRUS 2 (TAT 6-24 HRS): SARS Coronavirus 2: NEGATIVE

## 2019-04-07 LAB — PREPARE RBC (CROSSMATCH)

## 2019-04-07 MED ORDER — IOHEXOL 300 MG/ML  SOLN
100.0000 mL | Freq: Once | INTRAMUSCULAR | Status: AC | PRN
  Administered 2019-04-07: 21:00:00 100 mL via INTRAVENOUS

## 2019-04-07 MED ORDER — POTASSIUM CHLORIDE CRYS ER 20 MEQ PO TBCR
40.0000 meq | EXTENDED_RELEASE_TABLET | Freq: Once | ORAL | Status: AC
  Administered 2019-04-07: 21:00:00 40 meq via ORAL
  Filled 2019-04-07: qty 2

## 2019-04-07 MED ORDER — IOHEXOL 9 MG/ML PO SOLN
500.0000 mL | ORAL | Status: AC
  Administered 2019-04-07 (×2): 500 mL via ORAL

## 2019-04-07 MED ORDER — SODIUM CHLORIDE 0.9% IV SOLUTION
Freq: Once | INTRAVENOUS | Status: AC
  Administered 2019-04-07: 12:00:00 via INTRAVENOUS

## 2019-04-07 MED ORDER — MUPIROCIN 2 % EX OINT
1.0000 "application " | TOPICAL_OINTMENT | Freq: Two times a day (BID) | CUTANEOUS | Status: DC
  Administered 2019-04-07 – 2019-04-09 (×5): 1 via NASAL
  Filled 2019-04-07: qty 22

## 2019-04-07 MED ORDER — POTASSIUM CHLORIDE CRYS ER 20 MEQ PO TBCR
40.0000 meq | EXTENDED_RELEASE_TABLET | Freq: Once | ORAL | Status: AC
  Administered 2019-04-07: 08:00:00 40 meq via ORAL
  Filled 2019-04-07: qty 2

## 2019-04-07 MED ORDER — MAGNESIUM CITRATE PO SOLN
1.0000 | Freq: Once | ORAL | Status: DC
  Filled 2019-04-07: qty 296

## 2019-04-07 MED ORDER — MENTHOL 3 MG MT LOZG
1.0000 | LOZENGE | OROMUCOSAL | Status: DC | PRN
  Administered 2019-04-07: 3 mg via ORAL
  Filled 2019-04-07: qty 9

## 2019-04-07 MED ORDER — MAGNESIUM SULFATE 4 GM/100ML IV SOLN
4.0000 g | Freq: Once | INTRAVENOUS | Status: AC
  Administered 2019-04-07: 4 g via INTRAVENOUS
  Filled 2019-04-07: qty 100

## 2019-04-07 MED ORDER — CHLORHEXIDINE GLUCONATE CLOTH 2 % EX PADS
6.0000 | MEDICATED_PAD | Freq: Every day | CUTANEOUS | Status: DC
  Administered 2019-04-07 – 2019-04-09 (×3): 6 via TOPICAL

## 2019-04-07 MED ORDER — MAGNESIUM CITRATE PO SOLN
1.0000 | Freq: Once | ORAL | Status: AC
  Administered 2019-04-07: 21:00:00 1 via ORAL
  Filled 2019-04-07: qty 296

## 2019-04-07 MED ORDER — PANTOPRAZOLE SODIUM 40 MG PO TBEC
40.0000 mg | DELAYED_RELEASE_TABLET | Freq: Two times a day (BID) | ORAL | Status: DC
  Administered 2019-04-07 – 2019-04-09 (×5): 40 mg via ORAL
  Filled 2019-04-07 (×5): qty 1

## 2019-04-07 NOTE — Consult Note (Signed)
[name], MD [address]  [place], [place] [address]  Main: [phone]  Fax: [phone] Pager: [phone]   Consultation  Referring Provider:     No ref. provider found Primary Care Physician:  Rutherford Limerick, PA Primary Gastroenterologist: Lucilla Lame     Reason for Consultation:  Symptomatic anemia  Date of Admission:  04/06/2019 Date of Consultation:  04/07/2019         HPI:   Larry Andrade. is a 58 y.o. male with CHF, gout, alcoholic cirrhosis, history of total colectomy secondary to diverticulitis at [facility] in 10/2013, ileostomy takedown with ileorectal anastomosis in 01/2014, known history of peptic ulcer disease admitted with severe symptomatic anemia.  Hemoglobin on admission was 5.7, BUN/creatinine normal, he received 3 units of PRBCs so far.  Patient has been drinking alcohol 1 pint of vodka daily. Patient denies abdominal pain, nausea, vomiting, hematemesis, CGE, rectal bleeding, melena.  He has elevated LFTs, alkaline phosphatase 305, AST 169, ALT 49, total bilirubin 9.1.  Work-up of anemia revealed normal MCV, ferritin 2400, B12 997, folate 4.1 which is low Patient has chronic macrocytic anemia, admitted with GI bleed in 2019, EGD revealed esophageal, duodenal ulcer  NSAIDs: None  Antiplts/Anticoagulants/Anti thrombotics: None  GI Procedures:  EGD 10/26/2017 - Non-bleeding esophageal ulcer. - Medium-sized hiatal hernia. - One non-bleeding duodenal ulcer with no stigmata of bleeding. - No specimens collected.  Past Medical History:  Diagnosis Date  . CHF (congestive heart failure) ([place])   . Cirrhosis ([place])   . GERD (gastroesophageal reflux disease)   . Gout   . Hypertension   . Pancreatitis     Past Surgical History:  Procedure Laterality Date  . CARDIAC CATHETERIZATION    . COLON SURGERY    . ESOPHAGOGASTRODUODENOSCOPY (EGD) WITH PROPOFOL N/A 10/26/2017   Procedure: ESOPHAGOGASTRODUODENOSCOPY (EGD) WITH PROPOFOL;  Surgeon:  Jonathon Bellows, MD;  [facility]: [facility] ENDOSCOPY;  Service: Endoscopy;  Laterality: N/A;  . I&D EXTREMITY Right 09/05/2018   Procedure: IRRIGATION AND DEBRIDEMENT EXTREMITY;  Surgeon: Thornton Park, MD;  Location: [facility] ORS;  Service: Orthopedics;  Laterality: Right;  . KNEE ARTHROSCOPY Right 08/26/2018   Procedure: ARTHROSCOPY KNEE;  Surgeon: Thornton Park, MD;  Location: [facility] ORS;  Service: Orthopedics;  Laterality: Right;    Prior to Admission medications   Medication Sig Start Date End Date Taking? Authorizing Provider  allopurinol (ZYLOPRIM) 100 MG tablet Take 2 tablets (200 mg total) by mouth daily. 09/09/18  Yes Ojie, Jude, MD  amiodarone (PACERONE) 400 MG tablet Take 400 mg p.o. daily x5 days Then 200 mg p.o. twice daily for 3 weeks and then 200 mg p.o. daily Patient taking differently: Take 400 mg by mouth daily.  09/08/18  Yes Ojie, Jude, MD  amLODipine (NORVASC) 5 MG tablet Take 5 mg by mouth daily. 01/15/19  Yes [provider]  colchicine 0.6 MG tablet Take 1 tablet (0.6 mg total) by mouth daily. 08/21/18  Yes Demetrios Loll, MD  HYDROcodone-acetaminophen [facility]) 7.5-325 MG tablet Take 1-2 tablets by mouth every 4 (four) hours as needed for severe pain (pain score 7-10). 09/08/18  Yes Ojie, Jude, MD  metoprolol succinate (TOPROL-XL) 25 MG 24 hr tablet Take 25 mg by mouth 2 (two) times daily. 01/15/19  Yes [provider]  Multiple Vitamin (MULTIVITAMIN WITH MINERALS) TABS tablet Take 1 tablet by mouth daily. 10/26/17  Yes Wieting, Richard, MD  oxyCODONE-acetaminophen (PERCOCET) 7.5-325 MG tablet Take 1 tablet by mouth 3 (three) times  daily as needed. 03/23/19  Yes [provider]  albuterol (PROVENTIL) (2.5 MG/3ML) 0.083% nebulizer solution Take 2.5 mg by nebulization every 6 (six) hours as needed for wheezing or shortness of breath.    [provider]  benzonatate (TESSALON) 100 MG capsule Take 1 capsule (100 mg total) by mouth 2 (two) times daily as needed for  cough. Patient not taking: Reported on 04/06/2019 09/08/18   Otila Back, MD  feeding supplement, ENSURE ENLIVE, (ENSURE ENLIVE) LIQD Take 237 mLs by mouth 2 (two) times daily between meals. Patient not taking: Reported on 04/06/2019 09/08/18   Otila Back, MD  folic acid (FOLVITE) 1 MG tablet Take 1 tablet (1 mg total) by mouth daily. Patient not taking: Reported on 04/06/2019 08/21/18   Demetrios Loll, MD  furosemide (LASIX) 20 MG tablet Take 1 tablet (20 mg total) by mouth 2 (two) times daily. Patient not taking: Reported on 04/06/2019 09/08/18   Otila Back, MD  vitamin B-12 1000 MCG tablet Take 1 tablet (1,000 mcg total) by mouth daily. Patient not taking: Reported on 04/06/2019 08/21/18   Demetrios Loll, MD   Current Facility-Administered Medications:  .  0.9 %  sodium chloride infusion, 250 mL, Intravenous, PRN, Dustin Flock, MD .  allopurinol (ZYLOPRIM) tablet 200 mg, 200 mg, Oral, Daily, Dustin Flock, MD, 200 mg at 04/07/19 0944 .  amiodarone (PACERONE) tablet 400 mg, 400 mg, Oral, Daily, Dustin Flock, MD .  Chlorhexidine Gluconate Cloth 2 % PADS 6 each, 6 each, Topical, Q0600, Dustin Flock, MD, 6 each at 04/07/19 [phone] .  colchicine tablet 0.6 mg, 0.6 mg, Oral, Daily, Dustin Flock, MD, 0.6 mg at 19/75/88 3254 .  folic acid (FOLVITE) tablet 1 mg, 1 mg, Oral, Daily, Dustin Flock, MD, 1 mg at 04/07/19 0929 .  influenza vac split quadrivalent PF (FLUARIX) injection 0.5 mL, 0.5 mL, Intramuscular, Tomorrow-1000, Patel, Shreyang, MD .  ipratropium-albuterol (DUONEB) 0.5-2.5 (3) MG/3ML nebulizer solution 3 mL, 3 mL, Nebulization, Q6H PRN, Dustin Flock, MD, 3 mL at 04/07/19 1152 .  LORazepam (ATIVAN) tablet 1-4 mg, 1-4 mg, Oral, Q1H PRN **OR** LORazepam (ATIVAN) injection 1-4 mg, 1-4 mg, Intravenous, Q1H PRN, Dustin Flock, MD .  magnesium citrate solution 1 Bottle, 1 Bottle, Oral, Once, Kass Herberger, Tally Due, MD .  menthol-cetylpyridinium (CEPACOL) lozenge 3 mg, 1 lozenge, Oral, PRN, Max Sane, MD, 3 mg at 04/07/19 1036 .  metoprolol succinate (TOPROL-XL) 24 hr tablet 25 mg, 25 mg, Oral, BID, Dustin Flock, MD, 25 mg at 04/07/19 0943 .  multivitamin with minerals tablet 1 tablet, 1 tablet, Oral, Daily, Dustin Flock, MD, 1 tablet at 04/07/19 [phone] .  mupirocin ointment (BACTROBAN) 2 % 1 application, 1 application, Nasal, BID, Dustin Flock, MD, 1 application at 41/58/30 1036 .  ondansetron (ZOFRAN) tablet 4 mg, 4 mg, Oral, Q6H PRN **OR** ondansetron (ZOFRAN) injection 4 mg, 4 mg, Intravenous, Q6H PRN, Dustin Flock, MD .  oxyCODONE (Oxy IR/ROXICODONE) immediate release tablet 5 mg, 5 mg, Oral, Q4H PRN, Dustin Flock, MD, 5 mg at 04/07/19 1537 .  pantoprazole (PROTONIX) EC tablet 40 mg, 40 mg, Oral, BID, Max Sane, MD, 40 mg at 04/07/19 0929 .  pneumococcal 23 valent vaccine (PNU-IMMUNE) injection 0.5 mL, 0.5 mL, Intramuscular, Tomorrow-1000, Patel, Shreyang, MD .  potassium chloride SA (KLOR-CON) CR tablet 40 mEq, 40 mEq, Oral, Once, Manuella Ghazi, Vipul, MD .  sodium chloride flush (NS) 0.9 % injection 3 mL, 3 mL, Intravenous, Q12H, Dustin Flock, MD, 3 mL at 04/07/19 0945 .  sodium  chloride flush (NS) 0.9 % injection 3 mL, 3 mL, Intravenous, PRN, Dustin Flock, MD .  thiamine (VITAMIN B-1) tablet 100 mg, 100 mg, Oral, Daily, 100 mg at 04/07/19 0929 **OR** thiamine (B-1) injection 100 mg, 100 mg, Intravenous, Daily, Dustin Flock, MD\  Family History  Problem Relation Age of Onset  . CVA Mother   . CAD Mother   . CAD Father      Social History   Tobacco Use  . Smoking status: Current Every Day Smoker    Packs/day: 1.00    Types: Cigarettes  . Smokeless tobacco: Never Used  Substance Use Topics  . Alcohol use: Yes    Comment: 1 pint of vodka a day  . Drug use: No    Allergies as of 04/06/2019 - Review Complete 04/06/2019  Allergen Reaction Noted  . Penicillins Rash 02/22/2017    Review of Systems:    All systems reviewed and negative except where noted  in HPI.   Physical Exam:  Vital signs in last 24 hours: Temp:  [98.2 F (36.8 C)-99.1 F (37.3 C)] 98.6 F (37 C) (10/10 1529) Pulse Rate:  [98-111] 106 (10/10 1529) Resp:  [16-20] 16 (10/10 1529) BP: (100-112)/(65-78) 112/78 (10/10 1529) SpO2:  [95 %-100 %] 98 % (10/10 1529) Last BM Date: 04/06/19 General:   Pleasant, cooperative in NAD Head:  Normocephalic and atraumatic, cachectic, bitemporal wasting Eyes: Deep icterus.   Conjunctiva pink. PERRLA. Ears:  Normal auditory acuity. Neck:  Supple; no masses or thyroidomegaly Lungs: Respirations even and unlabored. Lungs clear to auscultation bilaterally.   No wheezes, crackles, or rhonchi.  Heart:  Regular rate and rhythm;  Without murmur, clicks, rubs or gallops Abdomen:  Soft, nondistended, nontender. Normal bowel sounds. No appreciable masses or hepatomegaly.  No rebound or guarding, vertical scar in the abdomen from previous surgery, small, reducible hernia in left lower quadrant, nontender.  Rectal:  Not performed. Msk:  Symmetrical without gross deformities. Extremities:  Without edema, cyanosis or clubbing. Neurologic:  Alert and oriented x3;  grossly normal neurologically. Skin:  Intact without significant lesions or rashes. Psych:  Alert and cooperative. Normal affect.  LAB RESULTS: CBC Latest Ref Rng & Units 04/07/2019 04/07/2019 04/06/2019  WBC 4.0 - 10.5 K/uL 9.1 8.6 10.7(H)  Hemoglobin 13.0 - 17.0 g/dL 6.8(L) 6.3(L) 5.1(L)  Hematocrit 39.0 - 52.0 % 19.9(L) 18.0(L) 15.8(L)  Platelets 150 - 400 K/uL 133(L) 124(L) 130(L)    BMET BMP Latest Ref Rng & Units 04/07/2019 04/06/2019 04/06/2019  Glucose 70 - 99 mg/dL 95 122(H) 89  BUN 6 - 20 mg/dL 16 18 16   Creatinine 0.61 - 1.24 mg/dL 0.83 1.16 0.99  Sodium 135 - 145 mmol/L 134(L) 136 136  Potassium 3.5 - 5.1 mmol/L 2.8(L) 3.1(L) 3.3(L)  Chloride 98 - 111 mmol/L 92(L) 90(L) 91(L)  CO2 22 - 32 mmol/L 30 22 20(L)  Calcium 8.9 - 10.3 mg/dL 7.7(L) 8.2(L) 8.5(L)    LFT  Hepatic Function Latest Ref Rng & Units 04/06/2019 04/06/2019 09/01/2018  Total Protein 6.5 - 8.1 g/dL 6.9 7.4 5.9(L)  Albumin 3.5 - 5.0 g/dL 2.5(L) 2.7(L) 1.9(L)  AST 15 - 41 U/L 169(H) 175(H) 41  ALT 0 - 44 U/L 49(H) 51(H) 29  Alk Phosphatase 38 - 126 U/L 305(H) 296(H) 243(H)  Total Bilirubin 0.3 - 1.2 mg/dL 9.1(H) 9.9(H) 2.0(H)  Bilirubin, Direct 0.0 - 0.2 mg/dL - 5.7(H) 0.9(H)     STUDIES: Dg Chest Portable 1 View  Result Date: 04/06/2019 CLINICAL DATA:  Shortness  of breath. EXAM: PORTABLE CHEST 1 VIEW COMPARISON:  Chest x-ray dated 08/26/2018 FINDINGS: The heart size and mediastinal contours are within normal limits. Both lungs are clear. No acute bone abnormality. Old healed right rib fractures. IMPRESSION: No active disease. Electronically Signed   By: Lorriane Shire M.D.   On: 04/06/2019 13:14   US Liver Doppler  Result Date: 04/07/2019 CLINICAL DATA:  Evaluate hepatic vasculature EXAM: DUPLEX ULTRASOUND OF LIVER TECHNIQUE: Color and duplex Doppler ultrasound was performed to evaluate the hepatic in-flow and out-flow vessels. COMPARISON:  CT abdomen pelvis-10/25/2017 FINDINGS: Liver: Hepatomegaly with diffuse increased coarsened echogenicity of the hepatic parenchyma with mild nodularity of the hepatic contour. Given this limitation, there no discrete hepatic lesions. No definite intrahepatic biliary ductal dilatation. Main Portal Vein size: 1.5 cm Portal Vein Velocities Main Prox:  33 cm/sec Main Mid: 37 cm/sec Main Dist:  18 cm/sec Right: 25 cm/sec Left: 20 cm/sec Hepatic Vein Velocities Right:  Not visualized Middle:  Not visualized Left:  Not visualized IVC: Not visualized Hepatic Artery Velocity:  Not visualized Spleen: 14.2 cm x 6.4 cm x 7.3 cm with a total volume of 895 cm^3 (411 cm^3 is upper limit normal) Portal Vein Occlusion/Thrombus: Appears patent where imaged Splenic Vein Occlusion/Thrombus: No Ascites: Suspected trace amount peri hepatic ascites (image 26). Varices: None  IMPRESSION: 1. While the hepatic vasculature appears patent where imaged with normal directional flow, there is nonvisualization of the hepatic veins and artery with suboptimal visualization the portal venous system secondary to poor sonographic window. Further evaluation could be performed with abdominal MRI or cirrhotic protocol CT scan as indicated. 2. Enlarged, markedly coarsened and heterogeneous appearing liver without discrete nodule or mass as could be seen in the setting of advanced hepatic steatosis. Further evaluation could be performed with additional cross-sectional imaging as recommended above. 3. Suspected portal venous hypertension with splenomegaly and trace amount of perihepatic ascites. Electronically Signed   By: Sandi Mariscal M.D.   On: 04/07/2019 14:02      Impression / Plan:   Megan Hayduk. is a 58 y.o. male with alcohol abuse, CHF, cirrhosis of liver, total colectomy, ileorectal anastomosis in 2015 secondary to severe diverticulitis admitted with severe symptomatic anemia with no evidence of active GI bleed.  Patient has chronic macrocytic anemia at baseline  Symptomatic anemia: There is no evidence of active GI bleed There is no evidence of iron deficiency anemia, has folate deficiency Continue oral folate 1 mg daily Given history of cirrhosis, and past history of peptic ulcer disease recommend EGD Recommend flexible sigmoidoscopy to evaluate the rectal stump along with EGD Transfuse as needed Continue Protonix 40 mg IV twice daily  Alcoholic cirrhosis Ultrasound Dopplers revealed suboptimal visualization of the portal venous system, recommended CT cirrhosis protocol Patient does not have abdominal pain, do not suspect Budd-Chiari syndrome Check AFP Viral hepatitis panel negative Complete abstinence from alcohol PSE none HRS none  Thank you for involving me in the care of this patient.  Dr. Vicente Males will cover from tomorrow    LOS: 1 day   Sherri Sear, MD   04/07/2019, 5:52 PM   Note: This dictation was prepared with Dragon dictation along with smaller phrase technology. Any transcriptional errors that result from this process are unintentional.

## 2019-04-07 NOTE — Progress Notes (Addendum)
[facility] [place] NAME: Larry Andrade    MR#:  [id number]  DATE OF BIRTH:  01-18-1961  SUBJECTIVE:  CHIEF COMPLAINT:   Chief Complaint  Patient presents with   Shortness of Breath   Jaundice  Hemoglobin 6.8, hypotensive/tachycardic, feels weak, denies any obvious bleeding REVIEW OF SYSTEMS:  Review of Systems  Constitutional: Positive for malaise/fatigue. Negative for diaphoresis, fever and weight loss.  HENT: Negative for ear discharge, ear pain, hearing loss, nosebleeds, sore throat and tinnitus.   Eyes: Negative for blurred vision and pain.  Respiratory: Negative for cough, hemoptysis, shortness of breath and wheezing.   Cardiovascular: Negative for chest pain, palpitations, orthopnea and leg swelling.  Gastrointestinal: Negative for abdominal pain, blood in stool, constipation, diarrhea, heartburn, nausea and vomiting.  Genitourinary: Negative for dysuria, frequency and urgency.  Musculoskeletal: Negative for back pain and myalgias.  Skin: Negative for itching and rash.  Neurological: Negative for dizziness, tingling, tremors, focal weakness, seizures, weakness and headaches.  Psychiatric/Behavioral: Negative for depression. The patient is not nervous/anxious.     DRUG ALLERGIES:   Allergies  Allergen Reactions   Penicillins Rash    Has patient had a PCN reaction causing immediate rash, facial/tongue/throat swelling, SOB or lightheadedness with hypotension: No Has patient had a PCN reaction causing severe rash involving mucus membranes or skin necrosis: No Has patient had a PCN reaction that required hospitalization: No Has patient had a PCN reaction occurring within the last 10 years: No If all of the above answers are "NO", then may proceed with Cephalosporin use.    VITALS:  Blood pressure 110/78, pulse (!) 107, temperature 99.1 F (37.3 C), temperature source Oral, resp. rate 19, height 6\' 1"  (1.854 m), weight  83 kg, SpO2 99 %. PHYSICAL EXAMINATION:  Physical Exam HENT:     Head: Normocephalic and atraumatic.  Eyes:     Conjunctiva/sclera: Conjunctivae normal.     Pupils: Pupils are equal, round, and reactive to light.  Neck:     Musculoskeletal: Normal range of motion and neck supple.     Thyroid: No thyromegaly.     Trachea: No tracheal deviation.  Cardiovascular:     Rate and Rhythm: Normal rate and regular rhythm.     Heart sounds: Normal heart sounds.  Pulmonary:     Effort: Pulmonary effort is normal. No respiratory distress.     Breath sounds: Normal breath sounds. No wheezing.  Chest:     Chest wall: No tenderness.  Abdominal:     General: Bowel sounds are normal. There is no distension.     Palpations: Abdomen is soft.     Tenderness: There is no abdominal tenderness.  Musculoskeletal: Normal range of motion.  Skin:    General: Skin is warm and dry.     Findings: No rash.  Neurological:     Mental Status: He is alert and oriented to person, place, and time.     Cranial Nerves: No cranial nerve deficit.    LABORATORY PANEL:  Male CBC Recent Labs  Lab 04/07/19 0801  WBC 9.1  HGB 6.8*  HCT 19.9*  PLT 133*   ------------------------------------------------------------------------------------------------------------------ Chemistries  Recent Labs  Lab 04/06/19 1722 04/07/19 0431  NA 136 134*  K 3.1* 2.8*  CL 90* 92*  CO2 22 30  GLUCOSE 122* 95  BUN 18 16  CREATININE 1.16 0.83  CALCIUM 8.2* 7.7*  MG 1.2*  --  AST 169*  --   ALT 49*  --   ALKPHOS 305*  --   BILITOT 9.1*  --    RADIOLOGY:  Dg Chest Portable 1 View  Result Date: 04/06/2019 CLINICAL DATA:  Shortness of breath. EXAM: PORTABLE CHEST 1 VIEW COMPARISON:  Chest x-ray dated 08/26/2018 FINDINGS: The heart size and mediastinal contours are within normal limits. Both lungs are clear. No acute bone abnormality. Old healed right rib fractures. IMPRESSION: No active disease. Electronically Signed    By: Francene Boyers M.D.   On: 04/06/2019 13:14   ASSESSMENT AND PLAN:  Patient is 58 year old with history of alcoholic cirrhosis presenting with shortness of breath  1.  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  No overt blood loss noted.   - guaiac his stools.  Asked GI to see. -Status post 2 units of packed RBC transfusion.  Hemoglobin still at 6.8 this morning.  Will order 1 more unit of transfusion  2.  Elevated LFTs due to alcoholic liver disease - check a hepatitis panel. GI has been consulted  3.  Chronic diastolic CHF currently compensated use Lasix as needed, monitor fluid ins and outs  4.   Hypotension/tachycardia: With a history of hypertension -Likely due to severe anemia, will hold Norvasc and continue metoprolol and Pacerone  5.  Gout continue colchicine  6.  Alcohol abuse placed on CIWA protocol  7.  Hypokalemia: Replete and recheck, check magnesium     All the records are reviewed and case discussed with Care Management/Social Worker. Management plans discussed with the patient, nursing and they are in agreement.  CODE STATUS: Full Code  TOTAL TIME TAKING CARE OF THIS PATIENT: 25 minutes.   More than 50% of the time was spent in counseling/coordination of care: YES  POSSIBLE D/C IN 2-3 DAYS, DEPENDING ON CLINICAL CONDITION.   Delfino Lovett M.D on 04/07/2019 at 11:26 AM  Between 7am to 6pm - Pager - [phone]  After 6pm go to [email or web address] - Scientist, research (life sciences) [facility]  Office  [phone]  CC: Primary care physician; Gildardo Pounds, PA  Note: This dictation was prepared with Dragon dictation along with smaller phrase technology. Any transcriptional errors that result from this process are unintentional.

## 2019-04-07 NOTE — Progress Notes (Signed)
Pts BP 110/78 HR 107. MD Manuella Ghazi notified. Per MD hold scheduled Norvasc and Pacerone, ok to give metoprolol. MD also notified hgb 6.8. MD placing order.

## 2019-04-08 ENCOUNTER — Encounter: Payer: Self-pay | Admitting: Anesthesiology

## 2019-04-08 ENCOUNTER — Other Ambulatory Visit: Payer: Self-pay

## 2019-04-08 ENCOUNTER — Inpatient Hospital Stay: Payer: Medicare Other | Admitting: Anesthesiology

## 2019-04-08 ENCOUNTER — Encounter
Admission: EM | Disposition: A | Discharge status: Home / Self Care | Payer: Self-pay | Source: Home / Self Care | Attending: Internal Medicine

## 2019-04-08 ENCOUNTER — Inpatient Hospital Stay: Payer: Medicare Other

## 2019-04-08 DIAGNOSIS — D500 Iron deficiency anemia secondary to blood loss (chronic): Principal | ICD-10-CM

## 2019-04-08 HISTORY — PX: ESOPHAGOGASTRODUODENOSCOPY: SHX5428

## 2019-04-08 HISTORY — PX: FLEXIBLE SIGMOIDOSCOPY: SHX5431

## 2019-04-08 LAB — CBC
HCT: 20.4 % — ABNORMAL LOW (ref 39.0–52.0)
Hemoglobin: 7.1 g/dL — ABNORMAL LOW (ref 13.0–17.0)
MCH: 30.3 pg (ref 26.0–34.0)
MCHC: 34.8 g/dL (ref 30.0–36.0)
MCV: 87.2 fL (ref 80.0–100.0)
Platelets: 135 10*3/uL — ABNORMAL LOW (ref 150–400)
RBC: 2.34 MIL/uL — ABNORMAL LOW (ref 4.22–5.81)
RDW: 22.3 % — ABNORMAL HIGH (ref 11.5–15.5)
WBC: 11.3 10*3/uL — ABNORMAL HIGH (ref 4.0–10.5)
nRBC: 0.5 % — ABNORMAL HIGH (ref 0.0–0.2)

## 2019-04-08 LAB — BASIC METABOLIC PANEL
Anion gap: 9 (ref 5–15)
Anion gap: 8 (ref 5–15)
BUN: 13 mg/dL (ref 6–20)
BUN: 11 mg/dL (ref 6–20)
CO2: 30 mmol/L (ref 22–32)
CO2: 28 mmol/L (ref 22–32)
Calcium: 7.8 mg/dL — ABNORMAL LOW (ref 8.9–10.3)
Calcium: 7.5 mg/dL — ABNORMAL LOW (ref 8.9–10.3)
Chloride: 93 mmol/L — ABNORMAL LOW (ref 98–111)
Chloride: 94 mmol/L — ABNORMAL LOW (ref 98–111)
Creatinine, Ser: 0.84 mg/dL (ref 0.61–1.24)
Creatinine, Ser: 0.99 mg/dL (ref 0.61–1.24)
GFR calc Af Amer: >60 mL/min (ref >60)
GFR calc Af Amer: >60 mL/min (ref >60)
GFR calc non Af Amer: >60 mL/min (ref >60)
GFR calc non Af Amer: >60 mL/min (ref >60)
Glucose, Bld: 93 mg/dL (ref 70–99)
Glucose, Bld: 108 mg/dL — ABNORMAL HIGH (ref 70–99)
Potassium: 3.7 mmol/L (ref 3.5–5.1)
Potassium: 3.8 mmol/L (ref 3.5–5.1)
Sodium: 132 mmol/L — ABNORMAL LOW (ref 135–145)
Sodium: 130 mmol/L — ABNORMAL LOW (ref 135–145)

## 2019-04-08 LAB — HEMOGLOBIN AND HEMATOCRIT, BLOOD
HCT: 23.6 % — ABNORMAL LOW (ref 39.0–52.0)
Hemoglobin: 7.9 g/dL — ABNORMAL LOW (ref 13.0–17.0)

## 2019-04-08 LAB — MAGNESIUM
Magnesium: 2.2 mg/dL (ref 1.7–2.4)
Magnesium: 1.7 mg/dL (ref 1.7–2.4)

## 2019-04-08 LAB — PREPARE RBC (CROSSMATCH)

## 2019-04-08 SURGERY — EGD (ESOPHAGOGASTRODUODENOSCOPY)
Anesthesia: General

## 2019-04-08 MED ORDER — GLYCOPYRROLATE 0.2 MG/ML IJ SOLN
INTRAMUSCULAR | Status: DC | PRN
  Administered 2019-04-08: 0.1 mg via INTRAVENOUS

## 2019-04-08 MED ORDER — GADOBUTROL 1 MMOL/ML IV SOLN: 8.0000 mL | Freq: Once | INTRAVENOUS | Status: DC | PRN

## 2019-04-08 MED ORDER — MIDAZOLAM HCL 2 MG/2ML IJ SOLN
INTRAMUSCULAR | Status: AC
  Filled 2019-04-08: qty 2

## 2019-04-08 MED ORDER — LIDOCAINE HCL (PF) 2 % IJ SOLN
INTRAMUSCULAR | Status: AC
  Filled 2019-04-08: qty 20

## 2019-04-08 MED ORDER — LIDOCAINE HCL (CARDIAC) PF 100 MG/5ML IV SOSY
PREFILLED_SYRINGE | INTRAVENOUS | Status: DC | PRN
  Administered 2019-04-08: 100 mg via INTRATRACHEAL

## 2019-04-08 MED ORDER — EPHEDRINE SULFATE 50 MG/ML IJ SOLN
INTRAMUSCULAR | Status: AC
  Filled 2019-04-08: qty 1

## 2019-04-08 MED ORDER — PROPOFOL 10 MG/ML IV BOLUS
INTRAVENOUS | Status: DC | PRN
  Administered 2019-04-08: 50 mg via INTRAVENOUS
  Administered 2019-04-08: 20 mg via INTRAVENOUS

## 2019-04-08 MED ORDER — SODIUM CHLORIDE 0.9% IV SOLUTION
Freq: Once | INTRAVENOUS | Status: AC
  Administered 2019-04-08: 12:00:00 via INTRAVENOUS

## 2019-04-08 MED ORDER — MIDAZOLAM HCL 5 MG/5ML IJ SOLN
INTRAMUSCULAR | Status: DC | PRN
  Administered 2019-04-08: 2 mg via INTRAVENOUS

## 2019-04-08 MED ORDER — GLYCOPYRROLATE 0.2 MG/ML IJ SOLN
INTRAMUSCULAR | Status: AC
  Filled 2019-04-08: qty 1

## 2019-04-08 MED ORDER — PHENYLEPHRINE HCL (PRESSORS) 10 MG/ML IV SOLN
INTRAVENOUS | Status: DC | PRN
  Administered 2019-04-08 (×2): 100 ug via INTRAVENOUS

## 2019-04-08 MED ORDER — PHENYLEPHRINE HCL (PRESSORS) 10 MG/ML IV SOLN
INTRAVENOUS | Status: AC
  Filled 2019-04-08: qty 1

## 2019-04-08 MED ORDER — PROPOFOL 500 MG/50ML IV EMUL
INTRAVENOUS | Status: DC | PRN
  Administered 2019-04-08: 150 ug/kg/min via INTRAVENOUS

## 2019-04-08 MED ORDER — PROPOFOL 500 MG/50ML IV EMUL
INTRAVENOUS | Status: AC
  Filled 2019-04-08: qty 50

## 2019-04-08 MED ORDER — SODIUM CHLORIDE 0.9 % IV SOLN
INTRAVENOUS | Status: DC
  Administered 2019-04-08: 10:00:00 1000 mL via INTRAVENOUS

## 2019-04-08 MED ORDER — PROPOFOL 500 MG/50ML IV EMUL
INTRAVENOUS | Status: AC
  Filled 2019-04-08: qty 100

## 2019-04-08 NOTE — Anesthesia Preprocedure Evaluation (Signed)
Anesthesia Evaluation  Patient identified by MRN, date of birth, ID band Patient awake    Reviewed: Allergy & Precautions, H&P , NPO status , Patient's Chart, lab work & pertinent test results  History of Anesthesia Complications Negative for: history of anesthetic complications  Airway Mallampati: III  TM Distance: >3 FB Neck ROM: limited    Dental  (+) Chipped, Poor Dentition, Missing   Pulmonary neg shortness of breath, pneumonia, COPD, Current Smoker and Patient abstained from smoking.,           Cardiovascular hypertension, (-) angina+CHF  (-) Past MI      Neuro/Psych negative neurological ROS  negative psych ROS   GI/Hepatic Neg liver ROS, GERD  Medicated and Controlled,  Endo/Other  negative endocrine ROS  Renal/GU negative Renal ROS  negative genitourinary   Musculoskeletal   Abdominal   Peds  Hematology negative hematology ROS (+)   Anesthesia Other Findings Patient is NPO appropriate and reports no nausea or vomiting today.  Past Medical History: No date: CHF (congestive heart failure) ([facility]) No date: Cirrhosis ([place]) No date: GERD (gastroesophageal reflux disease) No date: Gout No date: Hypertension No date: Pancreatitis  Past Surgical History: No date: CARDIAC CATHETERIZATION No date: COLON SURGERY 10/26/2017: ESOPHAGOGASTRODUODENOSCOPY (EGD) WITH PROPOFOL; N/A     Comment:  Procedure: ESOPHAGOGASTRODUODENOSCOPY (EGD) WITH               PROPOFOL;  Surgeon: Jonathon Bellows, MD;  Location: [facility]               ENDOSCOPY;  Service: Endoscopy;  Laterality: N/A; 09/05/2018: I&D EXTREMITY; Right     Comment:  Procedure: IRRIGATION AND DEBRIDEMENT EXTREMITY;                Surgeon: Thornton Park, MD;  Location: [facility] ORS;                Service: Orthopedics;  Laterality: Right; 08/26/2018: KNEE ARTHROSCOPY; Right     Comment:  Procedure: ARTHROSCOPY KNEE;  Surgeon: Thornton Park,              MD;   Location: [facility] ORS;  Service: Orthopedics;                Laterality: Right;  BMI    Body Mass Index: 24.14 kg/m      Reproductive/Obstetrics negative OB ROS                             Anesthesia Physical Anesthesia Plan  ASA: IV  Anesthesia Plan: General   Post-op Pain Management:    Induction: Intravenous  PONV Risk Score and Plan: Propofol infusion and TIVA  Airway Management Planned: Natural Airway and Nasal Cannula  Additional Equipment:   Intra-op Plan:   Post-operative Plan:   Informed Consent: I have reviewed the patients History and Physical, chart, labs and discussed the procedure including the risks, benefits and alternatives for the proposed anesthesia with the patient or authorized representative who has indicated his/her understanding and acceptance.     Dental Advisory Given  Plan Discussed with: Anesthesiologist, CRNA and Surgeon  Anesthesia Plan Comments: (Patient consented for risks of anesthesia including but not limited to:  - adverse reactions to medications - risk of intubation if required - damage to teeth, lips or other oral mucosa - sore throat or hoarseness - Damage to heart, brain, lungs or loss of life  Patient voiced understanding.)  Anesthesia Quick Evaluation  

## 2019-04-08 NOTE — Progress Notes (Signed)
Central tele called. Pt Running 5 beats of SVT. [facility] made aware, placing new orders.

## 2019-04-08 NOTE — Progress Notes (Signed)
1        [employer] - [place] [facility]   PATIENT NAME: Larry KannerJohn Andrade    MR#:  [id number]  DATE OF BIRTH:  01-Oct-1960  SUBJECTIVE:  CHIEF COMPLAINT:   Chief Complaint  Patient presents with  . Shortness of Breath  . Jaundice  Hemoglobin 7.1, hypotensive/tachycardic, feels weak, no obvious bleeding REVIEW OF SYSTEMS:  Review of Systems  Constitutional: Positive for malaise/fatigue. Negative for diaphoresis, fever and weight loss.  HENT: Negative for ear discharge, ear pain, hearing loss, nosebleeds, sore throat and tinnitus.   Eyes: Negative for blurred vision and pain.  Respiratory: Negative for cough, hemoptysis, shortness of breath and wheezing.   Cardiovascular: Negative for chest pain, palpitations, orthopnea and leg swelling.  Gastrointestinal: Negative for abdominal pain, blood in stool, constipation, diarrhea, heartburn, nausea and vomiting.  Genitourinary: Negative for dysuria, frequency and urgency.  Musculoskeletal: Negative for back pain and myalgias.  Skin: Negative for itching and rash.  Neurological: Negative for dizziness, tingling, tremors, focal weakness, seizures, weakness and headaches.  Psychiatric/Behavioral: Negative for depression. The patient is not nervous/anxious.    DRUG ALLERGIES:   Allergies  Allergen Reactions  . Penicillins Rash    Has patient had a PCN reaction causing immediate rash, facial/tongue/throat swelling, SOB or lightheadedness with hypotension: No Has patient had a PCN reaction causing severe rash involving mucus membranes or skin necrosis: No Has patient had a PCN reaction that required hospitalization: No Has patient had a PCN reaction occurring within the last 10 years: No If all of the above answers are "NO", then may proceed with Cephalosporin use.    VITALS:  Blood pressure 105/79, pulse 98, temperature 98.4 F (36.9 C), temperature source Oral, resp. rate 18, height 6\' 1"  (1.854 m), weight 83 kg, SpO2 95  %. PHYSICAL EXAMINATION:  Physical Exam HENT:     Head: Normocephalic and atraumatic.  Eyes:     Conjunctiva/sclera: Conjunctivae normal.     Pupils: Pupils are equal, round, and reactive to light.  Neck:     Musculoskeletal: Normal range of motion and neck supple.     Thyroid: No thyromegaly.     Trachea: No tracheal deviation.  Cardiovascular:     Rate and Rhythm: Normal rate and regular rhythm.     Heart sounds: Normal heart sounds.  Pulmonary:     Effort: Pulmonary effort is normal. No respiratory distress.     Breath sounds: Normal breath sounds. No wheezing.  Chest:     Chest wall: No tenderness.  Abdominal:     General: Bowel sounds are normal. There is no distension.     Palpations: Abdomen is soft.     Tenderness: There is no abdominal tenderness.  Musculoskeletal: Normal range of motion.  Skin:    General: Skin is warm and dry.     Findings: No rash.  Neurological:     Mental Status: He is alert and oriented to person, place, and time.     Cranial Nerves: No cranial nerve deficit.    LABORATORY PANEL:  Male CBC Recent Labs  Lab 04/08/19 0430  WBC 11.3*  HGB 7.1*  HCT 20.4*  PLT 135*   ------------------------------------------------------------------------------------------------------------------ Chemistries  Recent Labs  Lab 04/06/19 1722  04/08/19 0430  NA 136   < > 132*  K 3.1*   < > 3.7  CL 90*   < > 93*  CO2 22   < > 30  GLUCOSE 122*   < > 93  BUN 18   < > 13  CREATININE 1.16   < > 0.84  CALCIUM 8.2*   < > 7.8*  MG 1.2*  --  2.2  AST 169*  --   --   ALT 49*  --   --   ALKPHOS 305*  --   --   BILITOT 9.1*  --   --    < > = values in this interval not displayed.   RADIOLOGY:  Ct Abdomen Pelvis W Contrast  Result Date: 04/07/2019 CLINICAL DATA:  Cirrhosis EXAM: CT ABDOMEN AND PELVIS WITH CONTRAST TECHNIQUE: Multidetector CT imaging of the abdomen and pelvis was performed using the standard protocol following bolus administration of  intravenous contrast. CONTRAST:  OMNIPAQUE IOHEXOL 300 MG/ML  SOLN COMPARISON:  None. FINDINGS: Lower chest: There are trace bilateral pleural effusions with adjacent compressive atelectasis.Heart size is borderline enlarged. There is a trace pericardial effusion. There is bilateral gynecomastia Hepatobiliary: The noncontrast portion of the exam demonstrates a diffusely heterogeneous liver with multiple low-attenuation areas. This is favored to represent heterogeneous hepatic steatosis. The liver is significantly enlarged. The hepatic veins are patent. The portal vein is patent. The gallbladder demonstrates diffuse gallbladder wall thickening but is underdistended.There is no biliary ductal dilation. Pancreas: There is a 2.4 cm hypoattenuating area within the pancreatic head. This has demonstrated minimal interval increase in size since prior study in 2019. There is mild peripancreatic fat stranding. Spleen: The spleen is enlarged measuring 16 cm craniocaudad. Adrenals/Urinary Tract: --Adrenal glands: No adrenal hemorrhage. --Right kidney/ureter: No hydronephrosis or perinephric hematoma. --Left kidney/ureter: No hydronephrosis or perinephric hematoma. --Urinary bladder: Unremarkable. Stomach/Bowel: --Stomach/Duodenum: No hiatal hernia or other gastric abnormality. Normal duodenal course and caliber. --Small bowel: No dilatation or inflammation. --Colon: The patient is status post colectomy. The anastomosis is patent. --Appendix: Normal. Vascular/Lymphatic: Atherosclerotic calcification is present within the non-aneurysmal abdominal aorta, without hemodynamically significant stenosis. --No retroperitoneal lymphadenopathy. --No mesenteric lymphadenopathy. --No pelvic or inguinal lymphadenopathy. Reproductive: Unremarkable Other: There is a small amount of free fluid in the abdomen and pelvis. There is a midline ventral wall hernia containing loops of small bowel without evidence for an obstruction.  Musculoskeletal. There are multiple old healed left-sided rib fractures. No acute displaced fracture. IMPRESSION: 1. Patent hepatic veins. Patent portal vein. No acute vascular abnormality detected. 2. There is a 2.4 cm hypoattenuating area within the pancreatic head. This has demonstrated minimal interval increase in size since prior study in 2019. This could represent a pancreatic pseudocyst, or cystic pancreatic neoplasm. Follow-up with a contrast-enhanced MRI is recommended for further evaluation of this finding. 3. Mild fat stranding about the pancreas. Correlation with lipase is recommended as this could represent pancreatitis. 4. Gallbladder wall thickening which is nonspecific and can be seen in the presence of ascites or hepatocellular disease. If there is clinical concern for acute cholecystitis, follow-up with ultrasound is recommended. 5. There is a small amount of free fluid in the abdomen and pelvis. 6. There is a midline ventral wall hernia containing loops of small bowel without evidence for an obstruction. 7. Heterogeneous liver with multiple low-attenuation areas, favored to represent heterogeneous hepatic steatosis. No discrete hepatic mass detected. 8. Hepatosplenomegaly. 9. Trace bilateral pleural effusions with adjacent compressive atelectasis. 10. Chronic findings as detailed above. Aortic Atherosclerosis (ICD10-I70.0). Electronically Signed   By: Katherine Mantle M.D.   On: 04/07/2019 21:31   US Liver Doppler  Result Date: 04/07/2019 CLINICAL DATA:  Evaluate hepatic vasculature EXAM: DUPLEX ULTRASOUND OF LIVER TECHNIQUE:  Color and duplex Doppler ultrasound was performed to evaluate the hepatic in-flow and out-flow vessels. COMPARISON:  CT abdomen pelvis-10/25/2017 FINDINGS: Liver: Hepatomegaly with diffuse increased coarsened echogenicity of the hepatic parenchyma with mild nodularity of the hepatic contour. Given this limitation, there no discrete hepatic lesions. No definite  intrahepatic biliary ductal dilatation. Main Portal Vein size: 1.5 cm Portal Vein Velocities Main Prox:  33 cm/sec Main Mid: 37 cm/sec Main Dist:  18 cm/sec Right: 25 cm/sec Left: 20 cm/sec Hepatic Vein Velocities Right:  Not visualized Middle:  Not visualized Left:  Not visualized IVC: Not visualized Hepatic Artery Velocity:  Not visualized Spleen: 14.2 cm x 6.4 cm x 7.3 cm with a total volume of 895 cm^3 (411 cm^3 is upper limit normal) Portal Vein Occlusion/Thrombus: Appears patent where imaged Splenic Vein Occlusion/Thrombus: No Ascites: Suspected trace amount peri hepatic ascites (image 26). Varices: None IMPRESSION: 1. While the hepatic vasculature appears patent where imaged with normal directional flow, there is nonvisualization of the hepatic veins and artery with suboptimal visualization the portal venous system secondary to poor sonographic window. Further evaluation could be performed with abdominal MRI or cirrhotic protocol CT scan as indicated. 2. Enlarged, markedly coarsened and heterogeneous appearing liver without discrete nodule or mass as could be seen in the setting of advanced hepatic steatosis. Further evaluation could be performed with additional cross-sectional imaging as recommended above. 3. Suspected portal venous hypertension with splenomegaly and trace amount of perihepatic ascites. Electronically Signed   By: Sandi Mariscal M.D.   On: 04/07/2019 14:02   ASSESSMENT AND PLAN:  Patient is 58 year old with history of alcoholic cirrhosis presenting with shortness of breath  1.  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  No overt blood loss noted.   - guaiac his stools.  GI following -Status post 3 units of packed RBC transfusion.  Hemoglobin 7.1, will order 1 more PRBC transfusion  2.  Elevated LFTs due to alcoholic liver disease - neg hepatitis panel. - CT abd is worrisome for 2.4 cm cyst/mass in pancreatic head. Will obtain MRI abd for  further eval - GI is following - pending AFP  3.  Chronic diastolic CHF currently compensated use Lasix as needed, monitor fluid ins and outs  4.   Hypotension/tachycardia:  -Likely due to severe anemia, will hold Norvasc and continue metoprolol and Pacerone  5.  Gout continue colchicine  6.  Alcohol abuse - CIWA protocol  7.  Hypokalemia: Repleted     All the records are reviewed and case discussed with Care Management/Social Worker. Management plans discussed with the patient, nursing and they are in agreement.  CODE STATUS: Full Code  TOTAL TIME TAKING CARE OF THIS PATIENT: 25 minutes.   More than 50% of the time was spent in counseling/coordination of care: YES  POSSIBLE D/C IN 2-3 DAYS, DEPENDING ON CLINICAL CONDITION.   Max Sane M.D on 04/08/2019 at 9:27 AM  Between 7am to 6pm - Pager - [phone]  After 6pm go to [email or web address] - [employer]wer [facility]  Office  [phone]  CC: Primary care physician; Rutherford Limerick, PA  Note: This dictation was prepared with Dragon dictation along with smaller phrase technology. Any transcriptional errors that result from this process are unintentional.

## 2019-04-08 NOTE — Anesthesia Post-op Follow-up Note (Signed)
Anesthesia QCDR form completed.        

## 2019-04-08 NOTE — Op Note (Signed)
[facility] Patient Name: Larry Andrade Procedure Date: 04/08/2019 9:17 AM MRN: [id number] Account #: [id number] Date of Birth: August 25, 1960 Admit Type: Outpatient Age: 58 Room: [facility] ENDO ROOM 4 Gender: Male Note Status: Finalized Procedure:            Flexible Sigmoidoscopy Indications:          Gastrointestinal occult blood loss Providers:            Jonathon Bellows MD, MD Medicines:            Monitored Anesthesia Care Complications:        No immediate complications. Procedure:            Pre-Anesthesia Assessment:                       - Prior to the procedure, a History and Physical was                        performed, and patient medications, allergies and                        sensitivities were reviewed. The patient's tolerance of                        previous anesthesia was reviewed.                       - The risks and benefits of the procedure and the                        sedation options and risks were discussed with the                        patient. All questions were answered and informed                        consent was obtained.                       - ASA Grade Assessment: III - A patient with severe                        systemic disease.                       After obtaining informed consent, the scope was passed                        under direct vision. The Colonoscope was introduced                        through the anus and advanced to the. The Endoscope was                        introduced through the anus and advanced to the the                        ileo-rectal anastomosis. The flexible sigmoidoscopy was                        accomplished with ease. The patient tolerated the  procedure well. The quality of the bowel preparation                        was good. Findings:      The perianal and digital rectal examinations were normal.      There was evidence of a prior end-to-side  ileo-rectal anastomosis in the       rectum. This was patent and was characterized by healthy appearing       mucosa. The anastomosis was traversed.      The exam was otherwise without abnormality.      Brown stool seen , no melena or bright blood suggestuicve of active bleed Impression:           - Patent end-to-side ileo-rectal anastomosis,                        characterized by healthy appearing mucosa.                       - The examination was otherwise normal.                       - No specimens collected. Recommendation:       - Return patient to hospital ward for ongoing care.                       - To visualize the small bowel, perform video capsule                        endoscopy. Procedure Code(s):    --- Professional ---                       [phone], Sigmoidoscopy, flexible; diagnostic, including                        collection of specimen(s) by brushing or washing, when                        performed (separate procedure) Diagnosis Code(s):    --- Professional ---                       Z98.0, Intestinal bypass and anastomosis status                       R19.5, Other fecal abnormalities CPT copyright 2019 American Medical Association. All rights reserved. The codes documented in this report are preliminary and upon coder review may  be revised to meet current compliance requirements. [name], MD [name] MD, MD 04/08/2019 10:41:57 AM This report has been signed electronically. Number of Addenda: 0 Note Initiated On: 04/08/2019 9:17 AM Scope Withdrawal Time: 0 hours 1 minute 49 seconds  Total Procedure Duration: 0 hours 3 minutes 36 seconds  Estimated Blood Loss: Estimated blood loss: none.      [facility]

## 2019-04-08 NOTE — Op Note (Signed)
[facility] Patient Name: Drusilla KannerJohn Mullan Procedure Date: 04/08/2019 9:16 AM MRN: [id number] Account #: [id number] Date of Birth: 06-06-1961 Admit Type: Outpatient Age: 58 [facility] END[facility] ROOM 4 Gender: Male Note Status: Finalized Procedure:            Upper GI endoscopy Indications:          Iron deficiency anemia secondary to chronic blood loss Providers:            Wyline MoodKiran Skarlette Lattner MD, MD Medicines:            Monitored Anesthesia Care Complications:        No immediate complications. Procedure:            Pre-Anesthesia Assessment:                       - Prior to the procedure, a History and Physical was                        performed, and patient medications, allergies and                        sensitivities were reviewed. The patient's tolerance of                        previous anesthesia was reviewed.                       - The risks and benefits of the procedure and the                        sedation options and risks were discussed with the                        patient. All questions were answered and informed                        consent was obtained.                       - ASA Grade Assessment: III - A patient with severe                        systemic disease.                       After obtaining informed consent, the endoscope was                        passed under direct vision. Throughout the procedure,                        the patient's blood pressure, pulse, and oxygen                        saturations were monitored continuously. The Endoscope                        was introduced through the mouth, and advanced to the                        third part of duodenum. The upper  GI endoscopy was                        accomplished with ease. The patient tolerated the                        procedure well. Findings:      The esophagus was normal.      The examined duodenum was normal.      The cardia and gastric fundus  were normal on retroflexion.      Moderate portal hypertensive gastropathy was found in the entire       examined stomach.      A medium-sized hiatal hernia was present. Impression:           - Normal esophagus.                       - Normal examined duodenum.                       - Portal hypertensive gastropathy.                       - Medium-sized hiatal hernia.                       - No specimens collected. Recommendation:       - Perform a flexible sigmoidoscopy for further                        evaluation of the rectum and sigmoid colon today. Procedure Code(s):    --- Professional ---                       [phone], Esophagogastroduodenoscopy, flexible, transoral;                        diagnostic, including collection of specimen(s) by                        brushing or washing, when performed (separate procedure) Diagnosis Code(s):    --- Professional ---                       K76.6, Portal hypertension                       K31.89, Other diseases of stomach and duodenum                       K44.9, Diaphragmatic hernia without obstruction or                        gangrene                       D50.0, Iron deficiency anemia secondary to blood loss                        (chronic) CPT copyright 2019 American Medical Association. All rights reserved. The codes documented in this report are preliminary and upon coder review may  be revised to meet current compliance requirements. [name], MD [name] MD, MD 04/08/2019 10:31:55 AM This report has been signed electronically. Number of Addenda: 0  Note Initiated On: 04/08/2019 9:16 AM Estimated Blood Loss: Estimated blood loss: none.      [facility]

## 2019-04-08 NOTE — Progress Notes (Addendum)
Pt BP 100/75, HR 103. Metoprolol due at this time. Mews yellow. Md made aware. Okay to give metoprolol at this time. Yellow mews VS initiated as per protocol.

## 2019-04-08 NOTE — Progress Notes (Signed)
Hemoglobin 7.1 this morning. MD paged. Waiting for a call back.

## 2019-04-08 NOTE — Anesthesia Postprocedure Evaluation (Signed)
Anesthesia Post Note  Patient: Larry Andrade.  Procedure(s) Performed: ESOPHAGOGASTRODUODENOSCOPY (EGD) (N/A ) FLEXIBLE SIGMOIDOSCOPY (N/A )  Patient location during evaluation: PACU Anesthesia Type: General Level of consciousness: awake and alert Pain management: pain level controlled Vital Signs Assessment: post-procedure vital signs reviewed and stable Respiratory status: spontaneous breathing, nonlabored ventilation, respiratory function stable and patient connected to nasal cannula oxygen Cardiovascular status: blood pressure returned to baseline and stable Postop Assessment: no apparent nausea or vomiting Anesthetic complications: no     Last Vitals:  Vitals:   04/08/19 1107 04/08/19 1131  BP: 104/68 122/84  Pulse: (!) 102 (!) 107  Resp: 16 18  Temp:  37.1 C  SpO2: 96% 100%    Last Pain:  Vitals:   04/08/19 1131  TempSrc: Oral  PainSc:                  Precious Haws Piscitello

## 2019-04-08 NOTE — Transfer of Care (Signed)
Immediate Anesthesia Transfer of Care Note  Patient: Shell Yandow.  Procedure(s) Performed: ESOPHAGOGASTRODUODENOSCOPY (EGD) (N/A ) FLEXIBLE SIGMOIDOSCOPY (N/A )  Patient Location: PACU  Anesthesia Type:General  Level of Consciousness: awake, alert , oriented and patient cooperative  Airway & Oxygen Therapy: Patient Spontanous Breathing  Post-op Assessment: Report given to RN and Post -op Vital signs reviewed and stable  Post vital signs: Reviewed and stable  Last Vitals:  Vitals Value Taken Time  BP 97/69 04/08/19 1044  Temp    Pulse 107 04/08/19 1044  Resp 28 04/08/19 1044  SpO2 96 % 04/08/19 1044  Vitals shown include unvalidated device data.  Last Pain:  Vitals:   04/08/19 1006  TempSrc: Tympanic  PainSc:       Patients Stated Pain Goal: 4 (85/46/27 0350)  Complications: No apparent anesthesia complications

## 2019-04-08 NOTE — Progress Notes (Signed)
Verbal order by MD Vicente Males for soft diet. Order placed.

## 2019-04-08 NOTE — Anesthesia Preprocedure Evaluation (Deleted)
Anesthesia Evaluation    Airway        Dental   Pulmonary Current Smoker and Patient abstained from smoking.,           Cardiovascular      Neuro/Psych    GI/Hepatic   Endo/Other    Renal/GU      Musculoskeletal   Abdominal   Peds  Hematology   Anesthesia Other Findings   Reproductive/Obstetrics                            Anesthesia Physical  Anesthesia Plan  ASA: IV  Anesthesia Plan:    Post-op Pain Management:    Induction: Rapid sequence, Intravenous and Cricoid pressure planned  PONV Risk Score and Plan:   Airway Management Planned:   Additional Equipment:   Intra-op Plan:   Post-operative Plan:   Informed Consent: I have reviewed the patients History and Physical, chart, labs and discussed the procedure including the risks, benefits and alternatives for the proposed anesthesia with the patient or authorized representative who has indicated his/her understanding and acceptance.     Dental Advisory Given  Plan Discussed with: Anesthesiologist and CRNA  Anesthesia Plan Comments:         Anesthesia Quick Evaluation

## 2019-04-08 NOTE — Progress Notes (Signed)
15 minute call to floor. 

## 2019-04-08 NOTE — H&P (Signed)
Larry Mood, MD [address], [address], [place], [place], [address] [address], [address], [place], [place], [address] Phone: [phone]  Fax: [phone]  Primary Care Physician:  Gildardo Pounds, PA   Pre-Procedure History & Physical: HPI:  Larry Andrade. is a 58 y.o. male is here for an endoscopy and a sigmoidoscopy     Past Medical History:  Diagnosis Date  . CHF (congestive heart failure) (HCC)   . Cirrhosis (HCC)   . GERD (gastroesophageal reflux disease)   . Gout   . Hypertension   . Pancreatitis     Past Surgical History:  Procedure Laterality Date  . CARDIAC CATHETERIZATION    . COLON SURGERY    . ESOPHAGOGASTRODUODENOSCOPY (EGD) WITH PROPOFOL N/A 10/26/2017   Procedure: ESOPHAGOGASTRODUODENOSCOPY (EGD) WITH PROPOFOL;  Surgeon: Larry Mood, MD;  Location: [facility] ENDOSCOPY;  Service: Endoscopy;  Laterality: N/A;  . I&D EXTREMITY Right 09/05/2018   Procedure: IRRIGATION AND DEBRIDEMENT EXTREMITY;  Surgeon: Juanell Fairly, MD;  Location: [facility] ORS;  Service: Orthopedics;  Laterality: Right;  . KNEE ARTHROSCOPY Right 08/26/2018   Procedure: ARTHROSCOPY KNEE;  Surgeon: Juanell Fairly, MD;  Location: [facility] ORS;  Service: Orthopedics;  Laterality: Right;    Prior to Admission medications   Medication Sig Start Date End Date Taking? Authorizing Provider  allopurinol (ZYLOPRIM) 100 MG tablet Take 2 tablets (200 mg total) by mouth daily. 09/09/18  Yes Ojie, Jude, MD  amiodarone (PACERONE) 400 MG tablet Take 400 mg p.o. daily x5 days Then 200 mg p.o. twice daily for 3 weeks and then 200 mg p.o. daily Patient taking differently: Take 400 mg by mouth daily.  09/08/18  Yes Ojie, Jude, MD  amLODipine (NORVASC) 5 MG tablet Take 5 mg by mouth daily. 01/15/19  Yes [provider]  colchicine 0.6 MG tablet Take 1 tablet (0.6 mg total) by mouth daily. 08/21/18  Yes Shaune Pollack, MD  HYDROcodone-acetaminophen [facility]) 7.5-325 MG tablet Take 1-2 tablets by mouth  every 4 (four) hours as needed for severe pain (pain score 7-10). 09/08/18  Yes Ojie, Jude, MD  metoprolol succinate (TOPROL-XL) 25 MG 24 hr tablet Take 25 mg by mouth 2 (two) times daily. 01/15/19  Yes [provider]  Multiple Vitamin (MULTIVITAMIN WITH MINERALS) TABS tablet Take 1 tablet by mouth daily. 10/26/17  Yes Wieting, Richard, MD  oxyCODONE-acetaminophen (PERCOCET) 7.5-325 MG tablet Take 1 tablet by mouth 3 (three) times daily as needed. 03/23/19  Yes [provider]  albuterol (PROVENTIL) (2.5 MG/3ML) 0.083% nebulizer solution Take 2.5 mg by nebulization every 6 (six) hours as needed for wheezing or shortness of breath.    [provider]  benzonatate (TESSALON) 100 MG capsule Take 1 capsule (100 mg total) by mouth 2 (two) times daily as needed for cough. Patient not taking: Reported on 04/06/2019 09/08/18   Jama Flavors, MD  feeding supplement, ENSURE ENLIVE, (ENSURE ENLIVE) LIQD Take 237 mLs by mouth 2 (two) times daily between meals. Patient not taking: Reported on 04/06/2019 09/08/18   Jama Flavors, MD  folic acid (FOLVITE) 1 MG tablet Take 1 tablet (1 mg total) by mouth daily. Patient not taking: Reported on 04/06/2019 08/21/18   Shaune Pollack, MD  furosemide (LASIX) 20 MG tablet Take 1 tablet (20 mg total) by mouth 2 (two) times daily. Patient not taking: Reported on 04/06/2019 09/08/18   Jama Flavors, MD  vitamin B-12 1000 MCG tablet Take 1 tablet (1,000 mcg total) by  mouth daily. Patient not taking: Reported on 04/06/2019 08/21/18   Demetrios Loll, MD    Allergies as of 04/06/2019 - Review Complete 04/06/2019  Allergen Reaction Noted  . Penicillins Rash 02/22/2017    Family History  Problem Relation Age of Onset  . CVA Mother   . CAD Mother   . CAD Father     Social History   Socioeconomic History  . Marital status: Single    Spouse name: Not on file  . Number of children: Not on file  . Years of education: Not on file  . Highest education level: Not on file   Occupational History  . Not on file  Social Needs  . Financial resource strain: Not on file  . Food insecurity    Worry: Not on file    Inability: Not on file  . Transportation needs    Medical: Not on file    Non-medical: Not on file  Tobacco Use  . Smoking status: Current Every Day Smoker    Packs/day: 1.00    Types: Cigarettes  . Smokeless tobacco: Never Used  Substance and Sexual Activity  . Alcohol use: Yes    Comment: 1 pint of vodka a day  . Drug use: No  . Sexual activity: Not on file  Lifestyle  . Physical activity    Days per week: 0 days    Minutes per session: 0 min  . Stress: Only a little  Relationships  . Social Herbalist on phone: Not on file    Gets together: Not on file    Attends religious service: Not on file    Active member of club or organization: Not on file    Attends meetings of clubs or organizations: Not on file    Relationship status: Not on file  . Intimate partner violence    Fear of current or ex partner: Not on file    Emotionally abused: Not on file    Physically abused: Not on file    Forced sexual activity: Not on file  Other Topics Concern  . Not on file  Social History Narrative  . Not on file    Review of Systems: See HPI, otherwise negative ROS  Physical Exam: BP 110/76   Pulse (!) 105   Temp (!) 96.7 F (35.9 C) (Tympanic)   Resp 20   Ht 6\' 1"  (1.854 m)   Wt 83 kg   SpO2 100%   BMI 24.14 kg/m  General:   Alert,  pleasant and cooperative in NAD Head:  Normocephalic and atraumatic. Neck:  Supple; no masses or thyromegaly. Lungs:  Clear throughout to auscultation, normal respiratory effort.    Heart:  +S1, +S2, Regular rate and rhythm, No edema. Abdomen:  Soft, nontender and nondistended. Normal bowel sounds, without guarding, and without rebound.   Neurologic:  Alert and  oriented x4;  grossly normal neurologically.  Impression/Plan: [name]. is here for an endoscopy and sigmoidoscopy   to be  performed for  evaluation of anemia    Risks, benefits, limitations, and alternatives regarding endoscopy have been reviewed with the patient.  Questions have been answered.  All parties agreeable.   Jonathon Bellows, MD  04/08/2019, 10:16 AM

## 2019-04-08 NOTE — Progress Notes (Signed)
Report given to Anna RN.

## 2019-04-09 ENCOUNTER — Encounter: Payer: Self-pay | Admitting: Gastroenterology

## 2019-04-09 LAB — TYPE AND SCREEN
ABO/RH(D): AB POS
Antibody Screen: NEGATIVE
Unit division: 0
Unit division: 0
Unit division: 0
Unit division: 0

## 2019-04-09 LAB — BPAM RBC
Blood Product Expiration Date: 202010152359
Blood Product Expiration Date: 202010152359
Blood Product Expiration Date: 202010262359
Blood Product Expiration Date: 202011022359
ISSUE DATE / TIME: 202010091716
ISSUE DATE / TIME: 202010092152
ISSUE DATE / TIME: 202010101211
ISSUE DATE / TIME: 202010111138
Unit Type and Rh: 6200
Unit Type and Rh: 6200
Unit Type and Rh: 6200
Unit Type and Rh: 6200

## 2019-04-09 LAB — BASIC METABOLIC PANEL
Anion gap: 9 (ref 5–15)
BUN: 10 mg/dL (ref 6–20)
CO2: 27 mmol/L (ref 22–32)
Calcium: 7.5 mg/dL — ABNORMAL LOW (ref 8.9–10.3)
Chloride: 94 mmol/L — ABNORMAL LOW (ref 98–111)
Creatinine, Ser: 0.86 mg/dL (ref 0.61–1.24)
GFR calc Af Amer: >60 mL/min (ref >60)
GFR calc non Af Amer: >60 mL/min (ref >60)
Glucose, Bld: 109 mg/dL — ABNORMAL HIGH (ref 70–99)
Potassium: 3.8 mmol/L (ref 3.5–5.1)
Sodium: 130 mmol/L — ABNORMAL LOW (ref 135–145)

## 2019-04-09 LAB — CBC
HCT: 22.8 % — ABNORMAL LOW (ref 39.0–52.0)
Hemoglobin: 7.8 g/dL — ABNORMAL LOW (ref 13.0–17.0)
MCH: 30.5 pg (ref 26.0–34.0)
MCHC: 34.2 g/dL (ref 30.0–36.0)
MCV: 89.1 fL (ref 80.0–100.0)
Platelets: 130 10*3/uL — ABNORMAL LOW (ref 150–400)
RBC: 2.56 MIL/uL — ABNORMAL LOW (ref 4.22–5.81)
RDW: 21.9 % — ABNORMAL HIGH (ref 11.5–15.5)
WBC: 12.4 10*3/uL — ABNORMAL HIGH (ref 4.0–10.5)
nRBC: 0.5 % — ABNORMAL HIGH (ref 0.0–0.2)

## 2019-04-09 LAB — TROPONIN I (HIGH SENSITIVITY)
Troponin I (High Sensitivity): 10 ng/L (ref <18)
Troponin I (High Sensitivity): 6 ng/L (ref <18)

## 2019-04-09 LAB — AFP TUMOR MARKER: AFP, Serum, Tumor Marker: 2.5 ng/mL (ref 0.0–8.3)

## 2019-04-09 MED ORDER — AMIODARONE HCL 200 MG PO TABS: 200.0000 mg | ORAL_TABLET | Freq: Every day | ORAL | 0 refills | Status: AC

## 2019-04-09 MED ORDER — AMIODARONE HCL 200 MG PO TABS
200.0000 mg | ORAL_TABLET | Freq: Every day | ORAL | Status: DC
  Administered 2019-04-09: 200 mg via ORAL
  Filled 2019-04-09: qty 1

## 2019-04-09 MED ORDER — METOPROLOL SUCCINATE ER 25 MG PO TB24
25.0000 mg | ORAL_TABLET | Freq: Every day | ORAL | Status: DC
  Filled 2019-04-09: qty 1

## 2019-04-09 NOTE — Discharge Instructions (Signed)
Anemia  Anemia is a condition in which you do not have enough red blood cells or hemoglobin. Hemoglobin is a substance in red blood cells that carries oxygen. When you do not have enough red blood cells or hemoglobin (are anemic), your body cannot get enough oxygen and your organs may not work properly. As a result, you may feel very tired or have other problems. What are the causes? Common causes of anemia include:  Excessive bleeding. Anemia can be caused by excessive bleeding inside or outside the body, including bleeding from the intestine or from periods in women.  Poor nutrition.  Long-lasting (chronic) kidney, thyroid, and liver disease.  Bone marrow disorders.  Cancer and treatments for cancer.  HIV (human immunodeficiency virus) and AIDS (acquired immunodeficiency syndrome).  Treatments for HIV and AIDS.  Spleen problems.  Blood disorders.  Infections, medicines, and autoimmune disorders that destroy red blood cells. What are the signs or symptoms? Symptoms of this condition include:  Minor weakness.  Dizziness.  Headache.  Feeling heartbeats that are irregular or faster than normal (palpitations).  Shortness of breath, especially with exercise.  Paleness.  Cold sensitivity.  Indigestion.  Nausea.  Difficulty sleeping.  Difficulty concentrating. Symptoms may occur suddenly or develop slowly. If your anemia is mild, you may not have symptoms. How is this diagnosed? This condition is diagnosed based on:  Blood tests.  Your medical history.  A physical exam.  Bone marrow biopsy. Your health care provider may also check your stool (feces) for blood and may do additional testing to look for the cause of your bleeding. You may also have other tests, including:  Imaging tests, such as a CT scan or MRI.  Endoscopy.  Colonoscopy. How is this treated? Treatment for this condition depends on the cause. If you continue to lose a lot of blood, you may  need to be treated at a hospital. Treatment may include:  Taking supplements of iron, vitamin M08, or folic acid.  Taking a hormone medicine (erythropoietin) that can help to stimulate red blood cell growth.  Having a blood transfusion. This may be needed if you lose a lot of blood.  Making changes to your diet.  Having surgery to remove your spleen. Follow these instructions at home:  Take over-the-counter and prescription medicines only as told by your health care provider.  Take supplements only as told by your health care provider.  Follow any diet instructions that you were given.  Keep all follow-up visits as told by your health care provider. This is important. Contact a health care provider if:  You develop new bleeding anywhere in the body. Get help right away if:  You are very weak.  You are short of breath.  You have pain in your abdomen or chest.  You are dizzy or feel faint.  You have trouble concentrating.  You have bloody or black, tarry stools.  You vomit repeatedly or you vomit up blood. Summary  Anemia is a condition in which you do not have enough red blood cells or enough of a substance in your red blood cells that carries oxygen (hemoglobin).  Symptoms may occur suddenly or develop slowly.  If your anemia is mild, you may not have symptoms.  This condition is diagnosed with blood tests as well as a medical history and physical exam. Other tests may be needed.  Treatment for this condition depends on the cause of the anemia. This information is not intended to replace advice given to you by  your health care provider. Make sure you discuss any questions you have with your health care provider. °Document Released: 07/22/2004 Document Revised: 05/27/2017 Document Reviewed: 07/16/2016 °Elsevier Patient Education © 2020 Elsevier Inc. ° °

## 2019-04-09 NOTE — Progress Notes (Signed)
Ouma-NP Made aware of EKG and labs results availability via epic chat.

## 2019-04-09 NOTE — TOC Transition Note (Signed)
Transition of Care [facility]) - CM/SW Discharge Note   Patient Details  Name: Larry Andrade. MRN: [id number] Date of Birth: 06/02/1961  [facility] of [facility] CM/SW Contact:  Su Hilt, RN Phone Number: 04/09/2019, 11:00 AM   Clinical Narrative:    Met with the patient to discuss DC plan and needs He lives at home with his SO, Girlfriend.  He uses crutches at times when his gout flaires up, He is up to date with his PCP he uses [facility] [place] and can afford his medications all except one for cirrhosis.  I asked him what he does about it, he said he does not take it.  I asked him if his doctor is aware, he stated yes.  I encouraged him to look into the manufacturer of the medication,  Often times they will have programs for people to help with the cost,  I explained that his PCP could help him look into it.  He said he wasn't interested.  Due to the high CIWA score, I offered the patient resources for ETOH abuse, he refused and said he does not need them.  He still drives and said his girlfriend provides back up transportation when needed.    Final next level of care: Home/Self Care Barriers to Discharge: Barriers Resolved   Patient Goals and CMS Choice Patient states their goals for this hospitalization and ongoing recovery are:: go home      Discharge Placement                       Discharge Plan and Services                DME Arranged: N/A         HH Arranged: NA          Social Determinants of Health (SDOH) Interventions     Readmission Risk Interventions Readmission Risk Prevention Plan 04/09/2019  Transportation Screening Complete  PCP or Specialist Appt within 3-5 Days Complete  HRI or [place] Complete  Social Work Consult for [place] Planning/Counseling Patient refused  Palliative Care Screening Not Applicable  Medication Review [employer]) Referral to Pharmacy  Some recent data might be hidden

## 2019-04-09 NOTE — Care Management Important Message (Signed)
Important Message  Patient Details  Name: Larry Andrade. MRN: [id number] Date of Birth: 30-Nov-1960   Medicare Important Message Given:  Yes     Dannette Barbara 04/09/2019, 10:33 AM

## 2019-04-09 NOTE — Progress Notes (Signed)
Patient discharged home with personal belongings , medications explained, verbalized understanding, discharged by w/c to personal vehicle without incidence

## 2019-04-12 NOTE — Discharge Summary (Signed)
[employer] - [place] at [facility]   PATIENT NAME: Larry Andrade    MR#:  [id number]  DATE OF BIRTH:  04-08-1961  DATE OF ADMISSION:  04/06/2019   ADMITTING PHYSICIAN: Auburn Bilberry, MD  DATE OF DISCHARGE: 04/09/2019 12:30 PM  PRIMARY CARE PHYSICIAN: Gildardo Pounds, PA   ADMISSION DIAGNOSIS:  Symptomatic anemia [D64.9] DISCHARGE DIAGNOSIS:  Active Problems:   Symptomatic anemia  SECONDARY DIAGNOSIS:   Past Medical History:  Diagnosis Date  . CHF (congestive heart failure) (HCC)   . Cirrhosis (HCC)   . GERD (gastroesophageal reflux disease)   . Gout   . Hypertension   . Pancreatitis    HOSPITAL COURSE:  Patient is 58 year old with history of alcoholic cirrhosis presenting with shortness of breath  1.Symptomatic anemia suspect this is acute on chronic slow blood loss anemia due to liver disease. No overt blood loss noted.  - Status post 3-4 units of packed RBC transfusion. EGD & C-scope not showing any obvious bleeding. - hemodynamically stable. GI recommends outpt capsule study as it's not available in the Hospital  2.Elevated LFTs due to alcoholic liver disease - neg hepatitis panel. - CT abd is worrisome for 2.4 cm cyst/mass in pancreatic head. Offered MRI abd for further eval but patient refused, he would like to talk with outpt dr and PCP before doing this. - AFP wnl  3.Chronic diastolic CHF currently compensated  4.  Hypotension/tachycardia:  - resolved  5.Gout continue colchicine  6.Alcohol abuse - no s/s of withdrawal  7.  Hypokalemia: Repleted  Please note, patient is not very compliant with meds and not interested in getting further work up and wanted to leave. CSW offered him AA service due to alcohol abuse but he refused. DISCHARGE CONDITIONS:  stable CONSULTS OBTAINED:  Treatment Team:  Toney Reil, MD DRUG ALLERGIES:   Allergies  Allergen Reactions  . Penicillins Rash    Has patient had a PCN  reaction causing immediate rash, facial/tongue/throat swelling, SOB or lightheadedness with hypotension: No Has patient had a PCN reaction causing severe rash involving mucus membranes or skin necrosis: No Has patient had a PCN reaction that required hospitalization: No Has patient had a PCN reaction occurring within the last 10 years: No If all of the above answers are "NO", then may proceed with Cephalosporin use.    DISCHARGE MEDICATIONS:   Allergies as of 04/09/2019      Reactions   Penicillins Rash   Has patient had a PCN reaction causing immediate rash, facial/tongue/throat swelling, SOB or lightheadedness with hypotension: No Has patient had a PCN reaction causing severe rash involving mucus membranes or skin necrosis: No Has patient had a PCN reaction that required hospitalization: No Has patient had a PCN reaction occurring within the last 10 years: No If all of the above answers are "NO", then may proceed with Cephalosporin use.      Medication List    STOP taking these medications   amLODipine 5 MG tablet Commonly known as: NORVASC   benzonatate 100 MG capsule Commonly known as: TESSALON   cyanocobalamin 1000 MCG tablet   feeding supplement (ENSURE ENLIVE) Liqd   folic acid 1 MG tablet Commonly known as: FOLVITE   furosemide 20 MG tablet Commonly known as: LASIX     TAKE these medications   albuterol (2.5 MG/3ML) 0.083% nebulizer solution Commonly known as: PROVENTIL Take 2.5 mg by nebulization every 6 (six) hours as needed for wheezing or shortness of breath.  allopurinol 100 MG tablet Commonly known as: ZYLOPRIM Take 2 tablets (200 mg total) by mouth daily.   amiodarone 200 MG tablet Commonly known as: PACERONE Take 1 tablet (200 mg total) by mouth daily. What changed:   medication strength  how much to take  how to take this  when to take this  additional instructions   colchicine 0.6 MG tablet Take 1 tablet (0.6 mg total) by mouth daily.    HYDROcodone-acetaminophen 7.5-325 MG tablet Commonly known as: NORCO Take 1-2 tablets by mouth every 4 (four) hours as needed for severe pain (pain score 7-10).   metoprolol succinate 25 MG 24 hr tablet Commonly known as: TOPROL-XL Take 25 mg by mouth 2 (two) times daily.   multivitamin with minerals Tabs tablet Take 1 tablet by mouth daily.   oxyCODONE-acetaminophen 7.5-325 MG tablet Commonly known as: PERCOCET Take 1 tablet by mouth 3 (three) times daily as needed.        DISCHARGE INSTRUCTIONS:   DIET:  Regular diet DISCHARGE CONDITION:  Good ACTIVITY:  Activity as tolerated OXYGEN:  Home Oxygen: No.  Oxygen Delivery: room air DISCHARGE LOCATION:  home   If you experience worsening of your admission symptoms, develop shortness of breath, life threatening emergency, suicidal or homicidal thoughts you must seek medical attention immediately by calling 911 or calling your MD immediately  if symptoms less severe.  You Must read complete instructions/literature along with all the possible adverse reactions/side effects for all the Medicines you take and that have been prescribed to you. Take any new Medicines after you have completely understood and accpet all the possible adverse reactions/side effects.   Please note  You were cared for by a hospitalist during your hospital stay. If you have any questions about your discharge medications or the care you received while you were in the hospital after you are discharged, you can call the unit and asked to speak with the hospitalist on call if the hospitalist that took care of you is not available. Once you are discharged, your primary care physician will handle any further medical issues. Please note that NO REFILLS for any discharge medications will be authorized once you are discharged, as it is imperative that you return to your primary care physician (or establish a relationship with a primary care physician if you do not  have one) for your aftercare needs so that they can reassess your need for medications and monitor your lab values.    On the day of Discharge:  VITAL SIGNS:  Blood pressure 98/74, pulse 100, temperature 98.3 F (36.8 C), temperature source Oral, resp. rate 17, height 6\' 1"  (1.854 m), weight 83 kg, SpO2 98 %. PHYSICAL EXAMINATION:  GENERAL:  58 y.o.-year-old patient lying in the bed with no acute distress.  EYES: Pupils equal, round, reactive to light and accommodation. No scleral icterus. Extraocular muscles intact.  HEENT: Head atraumatic, normocephalic. Oropharynx and nasopharynx clear.  NECK:  Supple, no jugular venous distention. No thyroid enlargement, no tenderness.  LUNGS: Normal breath sounds bilaterally, no wheezing, rales,rhonchi or crepitation. No use of accessory muscles of respiration.  CARDIOVASCULAR: S1, S2 normal. No murmurs, rubs, or gallops.  ABDOMEN: Soft, non-tender, non-distended. Bowel sounds present. No organomegaly or mass.  EXTREMITIES: No pedal edema, cyanosis, or clubbing.  NEUROLOGIC: Cranial nerves II through XII are intact. Muscle strength 5/5 in all extremities. Sensation intact. Gait not checked.  PSYCHIATRIC: The patient is alert and oriented x 3.  SKIN: No obvious rash, lesion, or  ulcer.  DATA REVIEW:   CBC Recent Labs  Lab 04/09/19 0036  WBC 12.4*  HGB 7.8*  HCT 22.8*  PLT 130*    Chemistries  Recent Labs  Lab 04/06/19 1722  04/08/19 2228 04/09/19 0036  NA 136   < > 130* 130*  K 3.1*   < > 3.8 3.8  CL 90*   < > 94* 94*  CO2 22   < > 28 27  GLUCOSE 122*   < > 108* 109*  BUN 18   < > 11 10  CREATININE 1.16   < > 0.99 0.86  CALCIUM 8.2*   < > 7.5* 7.5*  MG 1.2*   < > 1.7  --   AST 169*  --   --   --   ALT 49*  --   --   --   ALKPHOS 305*  --   --   --   BILITOT 9.1*  --   --   --    < > = values in this interval not displayed.     Microbiology Results  Results for orders placed or performed during the hospital encounter of  04/06/19  SARS CORONAVIRUS 2 (TAT 6-24 HRS) Nasopharyngeal Nasopharyngeal Swab     Status: None   Collection Time: 04/06/19  3:39 PM   Specimen: Nasopharyngeal Swab  Result Value Ref Range Status   SARS Coronavirus 2 NEGATIVE NEGATIVE Final    Comment: (NOTE) SARS-CoV-2 target nucleic acids are NOT DETECTED. The SARS-CoV-2 RNA is generally detectable in upper and lower respiratory specimens during the acute phase of infection. Negative results do not preclude SARS-CoV-2 infection, do not rule out co-infections with other pathogens, and should not be used as the sole basis for treatment or other patient management decisions. Negative results must be combined with clinical observations, patient history, and epidemiological information. The expected result is Negative. Fact Sheet for Patients: HairSlick.nohttps://www.fda.gov/media/138098/download Fact Sheet for Healthcare Providers: quierodirigir.comhttps://www.fda.gov/media/138095/download This test is not yet approved or cleared by the [place] FDA and  has been authorized for detection and/or diagnosis of SARS-CoV-2 by FDA under an Emergency Use Authorization (EUA). This EUA will remain  in effect (meaning this test can be used) for the duration of the COVID-19 declaration under Section 56 4(b)(1) of the Act, 21 U.S.C. section 360bbb-3(b)(1), unless the authorization is terminated or revoked sooner. Performed at [facility], [address] [place], [place] [address]   MRSA PCR Screening     Status: Abnormal   Collection Time: 04/06/19  6:24 PM   Specimen: Nasopharyngeal  Result Value Ref Range Status   MRSA by PCR POSITIVE (A) NEGATIVE Final    Comment:        The GeneXpert MRSA Assay (FDA approved for NASAL specimens only), is one component of a comprehensive MRSA colonization surveillance program. It is not intended to diagnose MRSA infection nor to guide or monitor treatment for MRSA infections. RESULT CALLED TO, READ BACK BY AND  VERIFIED WITH: ASIA WILLIAMS ON 04/06/19 AT [address] [facility] Performed at [facility], [address] [place], [place] [address]     RADIOLOGY:  No results found.   Management plans discussed with the patient, family and they are in agreement.  CODE STATUS: Prior   TOTAL TIME TAKING CARE OF THIS PATIENT: 45 minutes.    Delfino LovettVipul Rayssa Atha M.D on 04/12/2019 at 7:10 PM  Between 7am to 6pm - Pager - [phone]  After 6pm go to [email or web address] -  password Environmental education officer  [employer] [facility]  Office  [phone]  CC: Primary care physician; Gildardo Pounds, PA   Note: This dictation was prepared with Dragon dictation along with smaller phrase technology. Any transcriptional errors that result from this process are unintentional.

## 2019-05-28 ENCOUNTER — Ambulatory Visit: Payer: Medicare Other | Admitting: Gastroenterology

## 2019-05-28 ENCOUNTER — Encounter: Payer: Self-pay | Admitting: Gastroenterology

## 2019-05-28 DIAGNOSIS — D500 Iron deficiency anemia secondary to blood loss (chronic): Secondary | ICD-10-CM

## 2019-10-06 ENCOUNTER — Emergency Department: Payer: Medicare Other

## 2019-10-06 ENCOUNTER — Inpatient Hospital Stay
Admission: EM | Admit: 2019-10-06 | Discharge: 2019-10-10 | DRG: 381 | Disposition: A | Discharge status: Home / Self Care | Payer: Medicare Other | Attending: Internal Medicine | Admitting: Internal Medicine

## 2019-10-06 ENCOUNTER — Other Ambulatory Visit: Payer: Self-pay

## 2019-10-06 DIAGNOSIS — D62 Acute posthemorrhagic anemia: Secondary | ICD-10-CM

## 2019-10-06 DIAGNOSIS — D6959 Other secondary thrombocytopenia: Secondary | ICD-10-CM | POA: Diagnosis present

## 2019-10-06 DIAGNOSIS — I110 Hypertensive heart disease with heart failure: Secondary | ICD-10-CM | POA: Diagnosis present

## 2019-10-06 DIAGNOSIS — D500 Iron deficiency anemia secondary to blood loss (chronic): Secondary | ICD-10-CM | POA: Diagnosis not present

## 2019-10-06 DIAGNOSIS — E872 Acidosis: Secondary | ICD-10-CM | POA: Diagnosis present

## 2019-10-06 DIAGNOSIS — Z79899 Other long term (current) drug therapy: Secondary | ICD-10-CM

## 2019-10-06 DIAGNOSIS — F1010 Alcohol abuse, uncomplicated: Secondary | ICD-10-CM

## 2019-10-06 DIAGNOSIS — K766 Portal hypertension: Secondary | ICD-10-CM | POA: Diagnosis present

## 2019-10-06 DIAGNOSIS — K449 Diaphragmatic hernia without obstruction or gangrene: Secondary | ICD-10-CM | POA: Diagnosis present

## 2019-10-06 DIAGNOSIS — K7030 Alcoholic cirrhosis of liver without ascites: Secondary | ICD-10-CM | POA: Diagnosis present

## 2019-10-06 DIAGNOSIS — Z880 Allergy status to penicillin: Secondary | ICD-10-CM | POA: Diagnosis not present

## 2019-10-06 DIAGNOSIS — K2211 Ulcer of esophagus with bleeding: Principal | ICD-10-CM | POA: Diagnosis present

## 2019-10-06 DIAGNOSIS — E876 Hypokalemia: Secondary | ICD-10-CM

## 2019-10-06 DIAGNOSIS — J441 Chronic obstructive pulmonary disease with (acute) exacerbation: Secondary | ICD-10-CM | POA: Diagnosis present

## 2019-10-06 DIAGNOSIS — Z888 Allergy status to other drugs, medicaments and biological substances status: Secondary | ICD-10-CM

## 2019-10-06 DIAGNOSIS — M109 Gout, unspecified: Secondary | ICD-10-CM | POA: Diagnosis present

## 2019-10-06 DIAGNOSIS — Z8249 Family history of ischemic heart disease and other diseases of the circulatory system: Secondary | ICD-10-CM

## 2019-10-06 DIAGNOSIS — D689 Coagulation defect, unspecified: Secondary | ICD-10-CM | POA: Diagnosis present

## 2019-10-06 DIAGNOSIS — E878 Other disorders of electrolyte and fluid balance, not elsewhere classified: Secondary | ICD-10-CM | POA: Diagnosis present

## 2019-10-06 DIAGNOSIS — I5032 Chronic diastolic (congestive) heart failure: Secondary | ICD-10-CM | POA: Diagnosis present

## 2019-10-06 DIAGNOSIS — I10 Essential (primary) hypertension: Secondary | ICD-10-CM

## 2019-10-06 DIAGNOSIS — K922 Gastrointestinal hemorrhage, unspecified: Secondary | ICD-10-CM | POA: Diagnosis present

## 2019-10-06 DIAGNOSIS — R7989 Other specified abnormal findings of blood chemistry: Secondary | ICD-10-CM

## 2019-10-06 DIAGNOSIS — I480 Paroxysmal atrial fibrillation: Secondary | ICD-10-CM

## 2019-10-06 DIAGNOSIS — K3189 Other diseases of stomach and duodenum: Secondary | ICD-10-CM | POA: Diagnosis present

## 2019-10-06 DIAGNOSIS — D696 Thrombocytopenia, unspecified: Secondary | ICD-10-CM

## 2019-10-06 DIAGNOSIS — N1831 Chronic kidney disease, stage 3a: Secondary | ICD-10-CM | POA: Diagnosis present

## 2019-10-06 DIAGNOSIS — K219 Gastro-esophageal reflux disease without esophagitis: Secondary | ICD-10-CM | POA: Diagnosis present

## 2019-10-06 DIAGNOSIS — D539 Nutritional anemia, unspecified: Secondary | ICD-10-CM | POA: Diagnosis present

## 2019-10-06 DIAGNOSIS — E8809 Other disorders of plasma-protein metabolism, not elsewhere classified: Secondary | ICD-10-CM | POA: Diagnosis present

## 2019-10-06 DIAGNOSIS — E538 Deficiency of other specified B group vitamins: Secondary | ICD-10-CM | POA: Diagnosis present

## 2019-10-06 DIAGNOSIS — J449 Chronic obstructive pulmonary disease, unspecified: Secondary | ICD-10-CM | POA: Diagnosis present

## 2019-10-06 DIAGNOSIS — Z20822 Contact with and (suspected) exposure to covid-19: Secondary | ICD-10-CM | POA: Diagnosis present

## 2019-10-06 DIAGNOSIS — F17210 Nicotine dependence, cigarettes, uncomplicated: Secondary | ICD-10-CM | POA: Diagnosis present

## 2019-10-06 DIAGNOSIS — Z823 Family history of stroke: Secondary | ICD-10-CM

## 2019-10-06 DIAGNOSIS — G8929 Other chronic pain: Secondary | ICD-10-CM | POA: Diagnosis present

## 2019-10-06 DIAGNOSIS — N179 Acute kidney failure, unspecified: Secondary | ICD-10-CM

## 2019-10-06 DIAGNOSIS — M79606 Pain in leg, unspecified: Secondary | ICD-10-CM | POA: Diagnosis present

## 2019-10-06 DIAGNOSIS — E860 Dehydration: Secondary | ICD-10-CM | POA: Diagnosis present

## 2019-10-06 DIAGNOSIS — M7989 Other specified soft tissue disorders: Secondary | ICD-10-CM | POA: Diagnosis present

## 2019-10-06 LAB — RESPIRATORY PANEL BY RT PCR (FLU A&B, COVID)
Influenza A by PCR: NEGATIVE
Influenza B by PCR: NEGATIVE
SARS Coronavirus 2 by RT PCR: NEGATIVE

## 2019-10-06 LAB — COMPREHENSIVE METABOLIC PANEL
ALT: 27 U/L (ref 0–44)
AST: 89 U/L — ABNORMAL HIGH (ref 15–41)
Albumin: 2.5 g/dL — ABNORMAL LOW (ref 3.5–5.0)
Alkaline Phosphatase: 312 U/L — ABNORMAL HIGH (ref 38–126)
Anion gap: 20 — ABNORMAL HIGH (ref 5–15)
BUN: 32 mg/dL — ABNORMAL HIGH (ref 6–20)
CO2: 23 mmol/L (ref 22–32)
Calcium: 8.2 mg/dL — ABNORMAL LOW (ref 8.9–10.3)
Chloride: 95 mmol/L — ABNORMAL LOW (ref 98–111)
Creatinine, Ser: 1.21 mg/dL (ref 0.61–1.24)
GFR calc Af Amer: >60 mL/min (ref >60)
GFR calc non Af Amer: >60 mL/min (ref >60)
Glucose, Bld: 96 mg/dL (ref 70–99)
Potassium: 3.2 mmol/L — ABNORMAL LOW (ref 3.5–5.1)
Sodium: 138 mmol/L (ref 135–145)
Total Bilirubin: 5.1 mg/dL — ABNORMAL HIGH (ref 0.3–1.2)
Total Protein: 7.2 g/dL (ref 6.5–8.1)

## 2019-10-06 LAB — CBC WITH DIFFERENTIAL/PLATELET
Abs Immature Granulocytes: 0.06 10*3/uL (ref 0.00–0.07)
Basophils Absolute: 0.1 10*3/uL (ref 0.0–0.1)
Basophils Relative: 1 %
Eosinophils Absolute: 0.2 10*3/uL (ref 0.0–0.5)
Eosinophils Relative: 1 %
HCT: 24.4 % — ABNORMAL LOW (ref 39.0–52.0)
Hemoglobin: 8 g/dL — ABNORMAL LOW (ref 13.0–17.0)
Immature Granulocytes: 1 %
Lymphocytes Relative: 18 %
Lymphs Abs: 2.3 10*3/uL (ref 0.7–4.0)
MCH: 36.2 pg — ABNORMAL HIGH (ref 26.0–34.0)
MCHC: 32.8 g/dL (ref 30.0–36.0)
MCV: 110.4 fL — ABNORMAL HIGH (ref 80.0–100.0)
Monocytes Absolute: 0.7 10*3/uL (ref 0.1–1.0)
Monocytes Relative: 6 %
Neutro Abs: 9.1 10*3/uL — ABNORMAL HIGH (ref 1.7–7.7)
Neutrophils Relative %: 73 %
Platelets: 122 10*3/uL — ABNORMAL LOW (ref 150–400)
RBC: 2.21 MIL/uL — ABNORMAL LOW (ref 4.22–5.81)
RDW: 16 % — ABNORMAL HIGH (ref 11.5–15.5)
WBC: 12.3 10*3/uL — ABNORMAL HIGH (ref 4.0–10.5)
nRBC: 0 % (ref 0.0–0.2)

## 2019-10-06 LAB — LACTIC ACID, PLASMA
Lactic Acid, Venous: 9.2 mmol/L (ref 0.5–1.9)
Lactic Acid, Venous: 8.1 mmol/L (ref 0.5–1.9)

## 2019-10-06 LAB — APTT: aPTT: 36 seconds (ref 24–36)

## 2019-10-06 LAB — PROTIME-INR
INR: 1.4 — ABNORMAL HIGH (ref 0.8–1.2)
Prothrombin Time: 16.7 seconds — ABNORMAL HIGH (ref 11.4–15.2)

## 2019-10-06 LAB — TROPONIN I (HIGH SENSITIVITY)
Troponin I (High Sensitivity): 13 ng/L (ref <18)
Troponin I (High Sensitivity): 11 ng/L (ref <18)

## 2019-10-06 LAB — BRAIN NATRIURETIC PEPTIDE: B Natriuretic Peptide: 187 pg/mL — ABNORMAL HIGH (ref 0.0–100.0)

## 2019-10-06 MED ORDER — POTASSIUM CHLORIDE 20 MEQ PO PACK: 40.0000 meq | PACK | Freq: Once | ORAL | Status: DC

## 2019-10-06 MED ORDER — SODIUM CHLORIDE 0.9 % IV BOLUS: 500.0000 mL | Freq: Once | INTRAVENOUS | Status: DC

## 2019-10-06 MED ORDER — AMIODARONE HCL 200 MG PO TABS
200.0000 mg | ORAL_TABLET | Freq: Every day | ORAL | Status: DC
  Administered 2019-10-06 – 2019-10-10 (×4): 200 mg via ORAL
  Filled 2019-10-06 (×6): qty 1

## 2019-10-06 MED ORDER — OCTREOTIDE LOAD VIA INFUSION
50.0000 ug | Freq: Once | INTRAVENOUS | Status: AC
  Administered 2019-10-06: 21:00:00 50 ug via INTRAVENOUS
  Filled 2019-10-06: qty 25

## 2019-10-06 MED ORDER — PANTOPRAZOLE SODIUM 40 MG IV SOLR
40.0000 mg | Freq: Two times a day (BID) | INTRAVENOUS | Status: DC
  Administered 2019-10-10: 40 mg via INTRAVENOUS
  Filled 2019-10-06: qty 40

## 2019-10-06 MED ORDER — ALBUTEROL SULFATE (2.5 MG/3ML) 0.083% IN NEBU
2.5000 mg | INHALATION_SOLUTION | Freq: Four times a day (QID) | RESPIRATORY_TRACT | Status: DC | PRN
  Administered 2019-10-07 – 2019-10-09 (×3): 2.5 mg via RESPIRATORY_TRACT
  Filled 2019-10-06 (×3): qty 3

## 2019-10-06 MED ORDER — ADULT MULTIVITAMIN W/MINERALS CH
1.0000 | ORAL_TABLET | Freq: Every day | ORAL | Status: DC
  Administered 2019-10-06 – 2019-10-10 (×4): 1 via ORAL
  Filled 2019-10-06 (×5): qty 1

## 2019-10-06 MED ORDER — ONDANSETRON HCL 4 MG/2ML IJ SOLN: 4.0000 mg | Freq: Four times a day (QID) | INTRAMUSCULAR | Status: DC | PRN

## 2019-10-06 MED ORDER — SODIUM CHLORIDE 0.9 % IV SOLN
2.0000 g | Freq: Once | INTRAVENOUS | Status: AC
  Administered 2019-10-06: 21:00:00 2 g via INTRAVENOUS
  Filled 2019-10-06: qty 20

## 2019-10-06 MED ORDER — ONDANSETRON HCL 4 MG PO TABS: 4.0000 mg | ORAL_TABLET | Freq: Four times a day (QID) | ORAL | Status: DC | PRN

## 2019-10-06 MED ORDER — SODIUM CHLORIDE 0.9 % IV BOLUS
250.0000 mL | Freq: Once | INTRAVENOUS | Status: AC
  Administered 2019-10-06: 250 mL via INTRAVENOUS

## 2019-10-06 MED ORDER — OXYCODONE-ACETAMINOPHEN 5-325 MG PO TABS
1.0000 | ORAL_TABLET | ORAL | Status: DC | PRN
  Administered 2019-10-06 – 2019-10-10 (×11): 1 via ORAL
  Filled 2019-10-06 (×11): qty 1

## 2019-10-06 MED ORDER — SODIUM CHLORIDE 0.9 % IV SOLN
50.0000 ug/h | INTRAVENOUS | Status: DC
  Filled 2019-10-06 (×2): qty 1

## 2019-10-06 MED ORDER — PANTOPRAZOLE SODIUM 40 MG IV SOLR: 40.0000 mg | Freq: Two times a day (BID) | INTRAVENOUS | Status: DC

## 2019-10-06 MED ORDER — SODIUM CHLORIDE 0.9 % IV SOLN
80.0000 mg | Freq: Once | INTRAVENOUS | Status: AC
  Administered 2019-10-06: 21:00:00 80 mg via INTRAVENOUS
  Filled 2019-10-06: qty 80

## 2019-10-06 MED ORDER — SODIUM CHLORIDE 0.9 % IV SOLN
8.0000 mg/h | INTRAVENOUS | Status: AC
  Administered 2019-10-06 – 2019-10-09 (×7): 8 mg/h via INTRAVENOUS
  Filled 2019-10-06 (×5): qty 80

## 2019-10-06 MED ORDER — SODIUM CHLORIDE 0.9% FLUSH
3.0000 mL | Freq: Two times a day (BID) | INTRAVENOUS | Status: DC
  Administered 2019-10-06 – 2019-10-10 (×7): 3 mL via INTRAVENOUS

## 2019-10-06 MED ORDER — METOPROLOL SUCCINATE ER 25 MG PO TB24
25.0000 mg | ORAL_TABLET | Freq: Two times a day (BID) | ORAL | Status: DC
  Administered 2019-10-06 – 2019-10-10 (×7): 25 mg via ORAL
  Filled 2019-10-06 (×8): qty 1

## 2019-10-06 MED ORDER — SODIUM CHLORIDE 0.9 % IV SOLN
8.0000 mg/h | INTRAVENOUS | Status: DC
  Administered 2019-10-06 – 2019-10-07 (×2): 8 mg/h via INTRAVENOUS
  Filled 2019-10-06: qty 80

## 2019-10-06 MED ORDER — ACETAMINOPHEN 325 MG PO TABS
650.0000 mg | ORAL_TABLET | Freq: Four times a day (QID) | ORAL | Status: DC | PRN
  Administered 2019-10-06: 650 mg via ORAL
  Filled 2019-10-06: qty 2

## 2019-10-06 MED ORDER — VANCOMYCIN HCL IN DEXTROSE 1-5 GM/200ML-% IV SOLN
1000.0000 mg | Freq: Once | INTRAVENOUS | Status: AC
  Administered 2019-10-06: 1000 mg via INTRAVENOUS
  Filled 2019-10-06: qty 200

## 2019-10-06 MED ORDER — TRAZODONE HCL 50 MG PO TABS
25.0000 mg | ORAL_TABLET | Freq: Every evening | ORAL | Status: DC | PRN
  Administered 2019-10-06: 25 mg via ORAL
  Filled 2019-10-06: qty 1

## 2019-10-06 MED ORDER — SODIUM CHLORIDE 0.9% FLUSH: 3.0000 mL | INTRAVENOUS | Status: DC | PRN

## 2019-10-06 MED ORDER — SODIUM CHLORIDE 0.9 % IV SOLN: 250.0000 mL | INTRAVENOUS | Status: DC | PRN

## 2019-10-06 MED ORDER — MAGNESIUM HYDROXIDE 400 MG/5ML PO SUSP
30.0000 mL | Freq: Every day | ORAL | Status: DC | PRN
  Filled 2019-10-06: qty 30

## 2019-10-06 MED ORDER — ACETAMINOPHEN 650 MG RE SUPP: 650.0000 mg | Freq: Four times a day (QID) | RECTAL | Status: DC | PRN

## 2019-10-06 NOTE — ED Notes (Signed)
CRITICAL LAB: [facility]ACTIC is 9.2, [employer], Dr. Fuller Plan notified, orders received

## 2019-10-06 NOTE — ED Provider Notes (Signed)
[facility] Emergency Department Provider Note  ____________________________________________   First MD Initiated Contact with Patient 10/06/19 [phone]     (approximate)  I have reviewed the triage vital signs and the nursing notes.   HISTORY  Chief Complaint Shortness of Breath    HPI Larry Andrade. is a 59 y.o. male with history of alcoholic cirrhosis, CHF who comes in with shortness of breath.  Patient reports yesterday having 3-4 episodes of bloody emesis and some dark stools.  The vomiting blood has stopped.  Since then he started to feel short of breath today that is constant, worse with exertion, better at rest.  States that he has been compliant with his diuretics but does feel like his legs are little bit more swollen than baseline.  He denies any abdominal pain, denies fevers.          Past Medical History:  Diagnosis Date  . CHF (congestive heart failure) (HCC)   . Cirrhosis (HCC)   . GERD (gastroesophageal reflux disease)   . Gout   . Hypertension   . Pancreatitis     Patient Active Problem List   Diagnosis Date Noted  . Symptomatic anemia 04/06/2019  . Malnutrition of moderate degree 09/04/2018  . Sepsis (HCC) 08/26/2018  . Acute on chronic systolic CHF (congestive heart failure) (HCC) 08/17/2018  . Lactic acid acidosis 10/25/2017  . Generalized abdominal pain   . Hematemesis with nausea   . Acute posthemorrhagic anemia   . Gout attack 02/22/2017    Past Surgical History:  Procedure Laterality Date  . CARDIAC CATHETERIZATION    . COLON SURGERY    . ESOPHAGOGASTRODUODENOSCOPY N/A 04/08/2019   Procedure: ESOPHAGOGASTRODUODENOSCOPY (EGD);  Surgeon: [name], MD;  Location: [facility] ENDOSCOPY;  Service: Gastroenterology;  Laterality: N/A;  . ESOPHAGOGASTRODUODENOSCOPY (EGD) WITH PROPOFOL N/A 10/26/2017   Procedure: ESOPHAGOGASTRODUODENOSCOPY (EGD) WITH PROPOFOL;  Surgeon: [name], MD;  Location: [facility] ENDOSCOPY;  Service:  Endoscopy;  Laterality: N/A;  . FLEXIBLE SIGMOIDOSCOPY N/A 04/08/2019   Procedure: FLEXIBLE SIGMOIDOSCOPY;  Surgeon: [name], MD;  Location: [facility] ENDOSCOPY;  Service: Gastroenterology;  Laterality: N/A;  . I & D EXTREMITY Right 09/05/2018   Procedure: IRRIGATION AND DEBRIDEMENT EXTREMITY;  Surgeon: Juanell Fairly, MD;  Location: [facility] ORS;  Service: Orthopedics;  Laterality: Right;  . KNEE ARTHROSCOPY Right 08/26/2018   Procedure: ARTHROSCOPY KNEE;  Surgeon: Juanell Fairly, MD;  Location: [facility] ORS;  Service: Orthopedics;  Laterality: Right;    Prior to Admission medications   Medication Sig Start Date End Date Taking? Authorizing Provider  albuterol (PROVENTIL) (2.5 MG/3ML) 0.083% nebulizer solution Take 2.5 mg by nebulization every 6 (six) hours as needed for wheezing or shortness of breath.   Yes [provider]  amiodarone (PACERONE) 200 MG tablet Take 1 tablet (200 mg total) by mouth daily. 04/09/19  Yes Delfino Lovett, MD  metoprolol succinate (TOPROL-XL) 25 MG 24 hr tablet Take 25 mg by mouth 2 (two) times daily. 01/15/19  Yes [provider]  Multiple Vitamin (MULTIVITAMIN WITH MINERALS) TABS tablet Take 1 tablet by mouth daily. 10/26/17   Alford Highland, MD    Allergies Penicillins  Family History  Problem Relation Age of Onset  . CVA Mother   . CAD Mother   . CAD Father     Social History Social History   Tobacco Use  . Smoking status: Current Every Day Smoker    Packs/day: 1.00    Types: Cigarettes  . Smokeless tobacco: Never Used  Substance  Use Topics  . Alcohol use: Yes    Comment: 1 pint of vodka a day  . Drug use: No      Review of Systems Constitutional: No fever/chills Eyes: No visual changes. ENT: No sore throat. Cardiovascular: Denies chest pain. Respiratory: Positive shortness of breath Gastrointestinal: No abdominal pain.  Vomiting blood, black stools Genitourinary: Negative for dysuria. Musculoskeletal: Negative for back  pain. Skin: Negative for rash. Neurological: Negative for headaches, focal weakness or numbness. All other ROS negative ____________________________________________   PHYSICAL EXAM:  VITAL SIGNS: ED Triage Vitals  Enc Vitals Group     BP 10/06/19 1856 (!) 147/86     Pulse Rate 10/06/19 1856 (!) 133     Resp 10/06/19 1856 14     Temp 10/06/19 1856 98 F (36.7 C)     Temp Source 10/06/19 1856 Oral     SpO2 10/06/19 1856 99 %     Weight 10/06/19 1857 182 lb 15.7 oz (83 kg)     Height --      Head Circumference --      Peak Flow --      Pain Score 10/06/19 1857 0     Pain Loc --      Pain Edu? --      Excl. in GC? --     Constitutional: Alert and oriented. Eyes: Conjunctivae are normal. EOMI. Head: Atraumatic. Nose: No congestion/rhinnorhea. Mouth/Throat: Mucous membranes are moist.   Neck: No stridor. Trachea Midline. FROM Cardiovascular: Tachycardic, regular rhythm. Grossly normal heart sounds.  Good peripheral circulation. Respiratory: Normal respiratory effort.  No retractions. Lungs CTAB. Gastrointestinal: Soft and nontender. No distention. No abdominal bruits.  Musculoskeletal: 2+ edema bilaterally no joint effusions. Neurologic:  Normal speech and language. No gross focal neurologic deficits are appreciated.  Skin:  Skin is warm, dry and intact.  Jaundiced appearing Psychiatric: Mood and affect are normal. Speech and behavior are normal. GU: No stool in the vault but some liquid that was Hemoccult positive  ____________________________________________   LABS (all labs ordered are listed, but only abnormal results are displayed)  Labs Reviewed  CBC WITH DIFFERENTIAL/PLATELET - Abnormal; Notable for the following components:      Result Value   WBC 12.3 (*)    RBC 2.21 (*)    Hemoglobin 8.0 (*)    HCT 24.4 (*)    MCV 110.4 (*)    MCH 36.2 (*)    RDW 16.0 (*)    Platelets 122 (*)    Neutro Abs 9.1 (*)    All other components within normal limits   PROTIME-INR - Abnormal; Notable for the following components:   Prothrombin Time 16.7 (*)    INR 1.4 (*)    All other components within normal limits  RESPIRATORY PANEL BY RT PCR (FLU A&B, COVID)  CULTURE, BLOOD (ROUTINE X 2)  CULTURE, BLOOD (ROUTINE X 2)  APTT  COMPREHENSIVE METABOLIC PANEL  BRAIN NATRIURETIC PEPTIDE  LACTIC ACID, PLASMA  LACTIC ACID, PLASMA  TYPE AND SCREEN  TROPONIN I (HIGH SENSITIVITY)   ____________________________________________   ED ECG REPORT I, Concha Se, the attending physician, personally viewed and interpreted this ECG.  EKG is sinus tachycardia rate of 124, no ST elevations, T wave inversions in V2, V3, right bundle branch block QTC prolonged ____________________________________________  RADIOLOGY Larry Andrade, personally viewed and evaluated these images (plain radiographs) as part of my medical decision making, as well as reviewing the written report by the radiologist.  ED  MD interpretation: No pneumonia noted  Official radiology report(s): DG Chest Portable 1 View  Result Date: 10/06/2019 CLINICAL DATA:  Shortness of breath. EXAM: PORTABLE CHEST 1 VIEW COMPARISON:  April 06, 2019 FINDINGS: Mild diffuse chronic appearing increased lung markings are seen without evidence of acute infiltrate, pleural effusion or pneumothorax. The heart size and mediastinal contours are within normal limits. Multiple chronic right rib fractures are seen. IMPRESSION: No active disease. Electronically Signed   By: Aram Candela M.D.   On: 10/06/2019 19:35    ____________________________________________   PROCEDURES  Procedure(s) performed (including Critical Care):  .Critical Care Performed by: Concha Se, MD Authorized by: Concha Se, MD   Critical care provider statement:    Critical care time (minutes):  45   Critical care was necessary to treat or prevent imminent or life-threatening deterioration of the following conditions:  Cardiac  failure   Critical care was time spent personally by me on the following activities:  Discussions with consultants, evaluation of patient's response to treatment, examination of patient, ordering and performing treatments and interventions, ordering and review of laboratory studies, ordering and review of radiographic studies, pulse oximetry, re-evaluation of patient's condition, obtaining history from patient or surrogate and review of old charts     ____________________________________________   INITIAL IMPRESSION / ASSESSMENT AND PLAN / ED COURSE  Larry Andrade. was evaluated in Emergency Department on 10/06/2019 for the symptoms described in the history of present illness. He was evaluated in the context of the global COVID-19 pandemic, which necessitated consideration that the patient might be at risk for infection with the SARS-CoV-2 virus that causes COVID-19. Institutional protocols and algorithms that pertain to the evaluation of patients at risk for COVID-19 are in a state of rapid change based on information released by regulatory bodies including the CDC and federal and state organizations. These policies and algorithms were followed during the patient's care in the ED.     Is a 59 year old who comes in with shortness of breath in the setting of vomiting blood and having black stool.  Will get chest x-ray to evaluate for pneumonia, cardiac markers evaluate for ACS however I have a concern for this being secondary to GI bleed.  On rectal exam patient had no stool in the vault but there was a little bit of clear liquid that I was able to put on the Hemoccult testing strip and it was grossly positive.  Will get labs to evaluate for anemia.  Given history of cirrhosis will start on PPI and octreotide.  Patient is not have any active vomiting at this time.  Patient does have swelling in his legs so this could be secondary to some CHF as well.  We will hold off on fluids at this  time   Hemoglobin is stable around his baseline at 8.0.  Bilirubin elevated consistent with his known liver cirrhosis.  I did let GI doctor wohl know that patient is here although at this time he is stable does not require emergent GI consult but in case he starts actively vomiting again advised him to be aware of patient.  Per Dr. Servando Snare patient has had 2 upper endoscopies without varices so unlikely variceal bleed. had a history of an esophageal ulcer. Would just recommend a PPI. EGD in 2019 and 6 months ago.   Patient is lactate is significantly elevated but he may not be clearing secondary to his cirrhosis.  However he is pretachycardic and he may be intravascularly dry  even though he is got edema in his legs.  Chest x-ray without evidence of pulmonary edema BNP not significantly elevated.  Will trial a very small amount of fluids to see if there is any improvement.   Given lactates elevated and with white count and heart rate he meet SIRS criteria will start on vancomycin in addition to the ceftriaxone already started for SBP prophylaxis.  However will hold off on full fluid resuscitation due to his severe cirrhosis and significant lower extremity edema.  We will need to closely monitor for additional fluids.  Patient is willing to come into the hospital.  Will admit patient to the hospital team   ______________________________   FINAL CLINICAL IMPRESSION(S) / ED DIAGNOSES   Final diagnoses:  Alcoholic cirrhosis, unspecified whether ascites present ([place])  Gastrointestinal hemorrhage, unspecified gastrointestinal hemorrhage type      MEDICATIONS GIVEN DURING THIS VISIT:  Medications  pantoprazole (PROTONIX) 80 mg in sodium chloride 0.9 % 100 mL IVPB (has no administration in time range)  pantoprazole (PROTONIX) 80 mg in sodium chloride 0.9 % 100 mL (0.8 mg/mL) infusion (has no administration in time range)  pantoprazole (PROTONIX) injection 40 mg (has no administration in time  range)  cefTRIAXone (ROCEPHIN) 2 g in sodium chloride 0.9 % 100 mL IVPB (has no administration in time range)  octreotide (SANDOSTATIN) 2 mcg/mL load via infusion 50 mcg (has no administration in time range)    And  octreotide (SANDOSTATIN) 500 mcg in sodium chloride 0.9 % 250 mL (2 mcg/mL) infusion (has no administration in time range)     ED Discharge Orders    None       Note:  This document was prepared using Dragon voice recognition software and may include unintentional dictation errors.   Vanessa Kiowa, MD 10/06/19 2008

## 2019-10-06 NOTE — ED Notes (Signed)
"  [facility]" called reports compatibility

## 2019-10-06 NOTE — H&P (Addendum)
at [place] NAME: Larry Andrade    MR#:  [id number]  DATE OF BIRTH:  25-Aug-1960  DATE OF ADMISSION:  10/06/2019  PRIMARY CARE PHYSICIAN: Rutherford Limerick, PA   REQUESTING/REFERRING PHYSICIAN: Loraine Leriche, MD  CHIEF COMPLAINT:   Chief Complaint  Patient presents with  . Shortness of Breath    HISTORY OF PRESENT ILLNESS:  Larry Andrade  is a 59 y.o. male with a known history of CHF, alcoholic liver cirrhosis, GERD, gout, tobacco abuse, hypertension and pancreatitis, who presented to the emergency medical onset of bloody vomitus and coffee-ground emesis with what looked like a melanotic stool as well as dyspnea.  He was noted to have liquid stool on rectal exam was performed by the ER physician that was heme positive.  He has been having dyspnea on exertion and occasional paroxysmal nocturnal dyspnea.  He denied any orthopnea or worsening lower extremity edema.  No chest pain or palpitations or cough or hemoptysis.  No other bleeding diathesis.  He denied any fever or chills.  No dysuria, oliguria or hematuria or flank pain.  Upon presentation to the emergency room, blood pressure was 147/86 with a pulse of 133.  Labs revealed hypokalemia of 3.2 and hypochloremia of 95 with a BUN of 32 and creatinine 1.2 compared to ten 0.86 on 04/09/2019.  AST was 89 and ALT 27 with albumin of 2.5 and total protein of 7.2, total bilirubin of 5.1.  BNP was 187 and high-sensitivity troponin I 13.  Lactic acid was 9.2.  CBC showed leukocytosis of 12.3 with anemia with hemoglobin of 8 and hematocrit 24.4 comparable to 04/09/2019 with macrocytosis and thrombocytopenia 122.  INR is 1.4 and PT 16.7 with PTT of 36.  The patient was typed and crossmatched.  He was given IV Protonix bolus and drip as well as IV octreotide bolus and drip and 2 g of IV Rocephin.  Dr. Allen Norris was notified about the patient.  The patient will be admitted to a medical monitored bed for further evaluation and  management. PAST MEDICAL HISTORY:   Past Medical History:  Diagnosis Date  . CHF (congestive heart failure) ([place])   . Cirrhosis ([place])   . GERD (gastroesophageal reflux disease)   . Gout   . Hypertension   . Pancreatitis     PAST SURGICAL HISTORY:   Past Surgical History:  Procedure Laterality Date  . CARDIAC CATHETERIZATION    . COLON SURGERY    . ESOPHAGOGASTRODUODENOSCOPY N/A 04/08/2019   Procedure: ESOPHAGOGASTRODUODENOSCOPY (EGD);  Surgeon: Jonathon Bellows, MD;  Location[facility] ENDOSCOPY;  Service: Gastroenterology;  Laterality: N/A;  . ESOPHAGOGASTRODUODENOSCOPY (EGD) WITH PROPOFOL N/A 10/26/2017   Procedure: ESOPHAGOGASTRODUODENOSCOPY (EGD) WITH PROPOFOL;  Surgeon: Jonathon Bellows, MD;  Location: [facility] ENDOSCOPY;  Service: Endoscopy;  Laterality: N/A;  . FLEXIBLE SIGMOIDOSCOPY N/A 04/08/2019   Procedure: FLEXIBLE SIGMOIDOSCOPY;  Surgeon: Jonathon Bellows, MD;  Location: [facility] ENDOSCOPY;  Service: Gastroenterology;  Laterality: N/A;  . I & D EXTREMITY Right 09/05/2018   Procedure: IRRIGATION AND DEBRIDEMENT EXTREMITY;  Surgeon: Thornton Park, MD;  Location: [facility] ORS;  Service: Orthopedics;  Laterality: Right;  . KNEE ARTHROSCOPY Right 08/26/2018   Procedure: ARTHROSCOPY KNEE;  Surgeon: Thornton Park, MD;  Location: [facility] ORS;  Service: Orthopedics;  Laterality: Right;    SOCIAL HISTORY:   Social History   Tobacco Use  . Smoking status: Current Every Day Smoker    Packs/day: 1.00    Types: Cigarettes  . Smokeless tobacco: Never Used  Substance Use Topics  . Alcohol use: Yes    Comment: 1 pint of vodka a day    FAMILY HISTORY:   Family History  Problem Relation Age of Onset  . CVA Mother   . CAD Mother   . CAD Father     DRUG ALLERGIES:   Allergies  Allergen Reactions  . Penicillins Rash    Has patient had a PCN reaction causing immediate rash, facial/tongue/throat swelling, SOB or lightheadedness with hypotension: No Has patient had a PCN reaction causing severe rash  involving mucus membranes or skin necrosis: No Has patient had a PCN reaction that required hospitalization: No Has patient had a PCN reaction occurring within the last 10 years: No If all of the above answers are "NO", then may proceed with Cephalosporin use.     REVIEW OF SYSTEMS:   ROS As per history of present illness. All pertinent systems were reviewed above. Constitutional,  HEENT, cardiovascular, respiratory, GI, GU, musculoskeletal, neuro, psychiatric, endocrine,  integumentary and hematologic systems were reviewed and are otherwise  negative/unremarkable except for positive findings mentioned above in the HPI.   MEDICATIONS AT HOME:   Prior to Admission medications   Medication Sig Start Date End Date Taking? Authorizing Provider  albuterol (PROVENTIL) (2.5 MG/3ML) 0.083% nebulizer solution Take 2.5 mg by nebulization every 6 (six) hours as needed for wheezing or shortness of breath.   Yes [provider]  amiodarone (PACERONE) 200 MG tablet Take 1 tablet (200 mg total) by mouth daily. 04/09/19  Yes Delfino Lovett, MD  metoprolol succinate (TOPROL-XL) 25 MG 24 hr tablet Take 25 mg by mouth 2 (two) times daily. 01/15/19  Yes [provider]  Multiple Vitamin (MULTIVITAMIN WITH MINERALS) TABS tablet Take 1 tablet by mouth daily. 10/26/17   Alford Highland, MD      VITAL SIGNS:  Blood pressure (!) 147/86, pulse (!) 133, temperature 98 F (36.7 C), temperature source Oral, resp. rate 14, weight 83 kg, SpO2 99 %.  PHYSICAL EXAMINATION:  Physical Exam  GENERAL:  59 y.o.-year-old Caucasian male patient lying in the bed with no acute distress.  EYES: Pupils equal, round, reactive to light and accommodation.  Positive scleral icterus. Extraocular muscles intact.  HEENT: Head atraumatic, normocephalic. Oropharynx and nasopharynx clear.  NECK:  Supple, no jugular venous distention. No thyroid enlargement, no tenderness.  LUNGS: Normal breath sounds bilaterally, no  wheezing, rales,rhonchi or crepitation. No use of accessory muscles of respiration.  CARDIOVASCULAR: Regular rate and rhythm, S1, S2 normal. No murmurs, rubs, or gallops.  ABDOMEN: Soft, nondistended, nontender. Bowel sounds present. No organomegaly or mass.  EXTREMITIES: 2+ bilateral lower extremity pitting edema, with no cyanosis, or clubbing.  NEUROLOGIC: Cranial nerves II through XII are intact. Muscle strength 5/5 in all extremities. Sensation intact. Gait not checked.  PSYCHIATRIC: The patient is alert and oriented x 3.  Normal affect and good eye contact. SKIN: No obvious rash, lesion, or ulcer.   LABORATORY PANEL:   CBC Recent Labs  Lab 10/06/19 1858  WBC 12.3*  HGB 8.0*  HCT 24.4*  PLT 122*   ------------------------------------------------------------------------------------------------------------------  Chemistries  Recent Labs  Lab 10/06/19 1858  NA 138  K 3.2*  CL 95*  CO2 23  GLUCOSE 96  BUN 32*  CREATININE 1.21  CALCIUM 8.2*  AST 89*  ALT 27  ALKPHOS 312*  BILITOT 5.1*   ------------------------------------------------------------------------------------------------------------------  Cardiac Enzymes No results for input(s): TROPONINI in the last 168 hours. ------------------------------------------------------------------------------------------------------------------  RADIOLOGY:  DG Chest Portable  1 View  Result Date: 10/06/2019 CLINICAL DATA:  Shortness of breath. EXAM: PORTABLE CHEST 1 VIEW COMPARISON:  April 06, 2019 FINDINGS: Mild diffuse chronic appearing increased lung markings are seen without evidence of acute infiltrate, pleural effusion or pneumothorax. The heart size and mediastinal contours are within normal limits. Multiple chronic right rib fractures are seen. IMPRESSION: No active disease. Electronically Signed   By: Aram Candela M.D.   On: 10/06/2019 19:35      IMPRESSION AND PLAN:   1.  Upper GI bleeding. -The patient  will be admitted to stepdown unit bed. -He will be hydrated with IV normal saline. -He was typed and crossmatched. -We will follow serial hemoglobins and hematocrits. -At this time they are both stable. -We will continue the patient on IV Protonix drip. -Given the fact that he had 2 previous EGDs that showed no evidence for esophageal varices we will stop octreotide. -A gastroenterology consult will be obtained. -Dr. Servando Snare was notified about the patient.  2.  Hypokalemia. -Potassium will be replaced and magnesium level will be checked.  3.  Mild acute kidney injury. -We are holding off on hydration at this time given elevated BNP and lower extremity edema. -We will follow BMP in a.m.  4.  Elevated lactic acid. -This like secondary to his bleeding and diminished hepatic clearance. -It is currently coming down on will be further followed. -The patient does not appear to be septic and had no fever. -She had 2 blood cultures drawn and received IV Rocephin and vancomycin -His leukocytosis is likely stress demargination. -I will hold further antibiotics at this time pending blood cultures results. -We will follow CBC.  5.  Gout. -Allopurinol will be continued.  6.  Paroxysmal atrial fibrillation. -We will continue amiodarone and Toprol-XL.  7.  Hypertension. -We will continue Norvasc.  8.  Vitamin B12 deficiency. -We will continue vitamin B12.  9.  Alcoholic liver cirrhosis. -This could be the culprit for his lower extremity edema. -I counseled him for cessation of alcohol. -We will follow LFTs.  10.  DVT prophylaxis. -SCDs. -Medical prophylaxis currently contraindicated due to GI bleeding and coagulopathy.  11.  GI prophylaxis. -The patient will be on IV PPI therapy with Protonix.    All the records are reviewed and case discussed with ED provider. The plan of care was discussed in details with the patient (and family). I answered all questions. The patient agreed to  proceed with the above mentioned plan. Further management will depend upon hospital course.   CODE STATUS: Full code  Status is: Inpatient  Remains inpatient appropriate because:IV treatments appropriate due to intensity of illness or inability to take PO   Dispo: The patient is from: Home              Anticipated d/c is to: Home              Anticipated d/c date is: 2 days              Patient currently is not medically stable to d/c.   TOTAL TIME TAKING CARE OF THIS PATIENT: 55  minutes.    Hannah Beat M.D on 10/06/2019 at 8:34 PM  [facility]   From 7 PM-7 AM, contact night-coverage [email or web address]  CC: Primary care physician; Gildardo Pounds, PA   Note: This dictation was prepared with Dragon dictation along with smaller phrase technology. Any transcriptional errors that result from this process are unintentional.

## 2019-10-06 NOTE — Consult Note (Signed)
PHARMACY -  BRIEF ANTIBIOTIC NOTE   Pharmacy has received consult(s) for VANCOMYCIN from an ED provider.  The patient's profile has been reviewed for ht/wt/allergies/indication/available labs.    One time order(s) placed for VANCOMYCIN 1GX1  Further antibiotics/pharmacy consults should be ordered by admitting physician if indicated.                       Thank you, Katha Cabal 10/06/2019  8:17 PM

## 2019-10-06 NOTE — ED Triage Notes (Signed)
Pt presents via acmes from home with c/o exertional shortness of breath, worsening pitting edema. Pt also reports vomiting blood and dark blood in stool at home. Pt currently alert and oriented x4. Hx of cirrhosis and CHF. Pt endorses taking home medications as prescribed.

## 2019-10-07 ENCOUNTER — Encounter: Payer: Self-pay | Admitting: Family Medicine

## 2019-10-07 DIAGNOSIS — J441 Chronic obstructive pulmonary disease with (acute) exacerbation: Secondary | ICD-10-CM

## 2019-10-07 DIAGNOSIS — K7030 Alcoholic cirrhosis of liver without ascites: Secondary | ICD-10-CM | POA: Insufficient documentation

## 2019-10-07 DIAGNOSIS — K922 Gastrointestinal hemorrhage, unspecified: Secondary | ICD-10-CM

## 2019-10-07 LAB — COMPREHENSIVE METABOLIC PANEL
ALT: 22 U/L (ref 0–44)
AST: 64 U/L — ABNORMAL HIGH (ref 15–41)
Albumin: 2.5 g/dL — ABNORMAL LOW (ref 3.5–5.0)
Alkaline Phosphatase: 275 U/L — ABNORMAL HIGH (ref 38–126)
Anion gap: 12 (ref 5–15)
BUN: 37 mg/dL — ABNORMAL HIGH (ref 6–20)
CO2: 30 mmol/L (ref 22–32)
Calcium: 7.9 mg/dL — ABNORMAL LOW (ref 8.9–10.3)
Chloride: 95 mmol/L — ABNORMAL LOW (ref 98–111)
Creatinine, Ser: 1.42 mg/dL — ABNORMAL HIGH (ref 0.61–1.24)
GFR calc Af Amer: >60 mL/min (ref >60)
GFR calc non Af Amer: 55 mL/min — ABNORMAL LOW (ref >60)
Glucose, Bld: 89 mg/dL (ref 70–99)
Potassium: 3.4 mmol/L — ABNORMAL LOW (ref 3.5–5.1)
Sodium: 137 mmol/L (ref 135–145)
Total Bilirubin: 4.2 mg/dL — ABNORMAL HIGH (ref 0.3–1.2)
Total Protein: 6.7 g/dL (ref 6.5–8.1)

## 2019-10-07 LAB — HIV ANTIBODY (ROUTINE TESTING W REFLEX): HIV Screen 4th Generation wRfx: NONREACTIVE

## 2019-10-07 LAB — CBC
HCT: 22.9 % — ABNORMAL LOW (ref 39.0–52.0)
Hemoglobin: 7.6 g/dL — ABNORMAL LOW (ref 13.0–17.0)
MCH: 36 pg — ABNORMAL HIGH (ref 26.0–34.0)
MCHC: 33.2 g/dL (ref 30.0–36.0)
MCV: 108.5 fL — ABNORMAL HIGH (ref 80.0–100.0)
Platelets: 103 10*3/uL — ABNORMAL LOW (ref 150–400)
RBC: 2.11 MIL/uL — ABNORMAL LOW (ref 4.22–5.81)
RDW: 16.3 % — ABNORMAL HIGH (ref 11.5–15.5)
WBC: 7.1 10*3/uL (ref 4.0–10.5)
nRBC: 0 % (ref 0.0–0.2)

## 2019-10-07 LAB — URINALYSIS, COMPLETE (UACMP) WITH MICROSCOPIC
Bacteria, UA: NONE SEEN
Bilirubin Urine: NEGATIVE
Glucose, UA: NEGATIVE mg/dL
Ketones, ur: NEGATIVE mg/dL
Leukocytes,Ua: NEGATIVE
Nitrite: NEGATIVE
Protein, ur: NEGATIVE mg/dL
Specific Gravity, Urine: 1.008 (ref 1.005–1.030)
pH: 5 (ref 5.0–8.0)

## 2019-10-07 LAB — HEMOGLOBIN AND HEMATOCRIT, BLOOD
HCT: 22.5 % — ABNORMAL LOW (ref 39.0–52.0)
HCT: 22.1 % — ABNORMAL LOW (ref 39.0–52.0)
Hemoglobin: 7.6 g/dL — ABNORMAL LOW (ref 13.0–17.0)
Hemoglobin: 7.4 g/dL — ABNORMAL LOW (ref 13.0–17.0)

## 2019-10-07 LAB — LACTIC ACID, PLASMA: Lactic Acid, Venous: 3.5 mmol/L (ref 0.5–1.9)

## 2019-10-07 LAB — MAGNESIUM: Magnesium: 1.2 mg/dL — ABNORMAL LOW (ref 1.7–2.4)

## 2019-10-07 MED ORDER — SODIUM CHLORIDE 0.9 % IV SOLN
1.0000 mg | Freq: Once | INTRAVENOUS | Status: AC
  Administered 2019-10-07: 19:00:00 1 mg via INTRAVENOUS
  Filled 2019-10-07 (×2): qty 0.2

## 2019-10-07 MED ORDER — POTASSIUM CHLORIDE 20 MEQ/15ML (10%) PO SOLN
40.0000 meq | Freq: Once | ORAL | Status: AC
  Administered 2019-10-07: 40 meq via ORAL
  Filled 2019-10-07 (×2): qty 30

## 2019-10-07 MED ORDER — VITAMIN B-12 100 MCG PO TABS
100.0000 ug | ORAL_TABLET | Freq: Every day | ORAL | Status: DC
  Administered 2019-10-08 – 2019-10-10 (×3): 100 ug via ORAL
  Filled 2019-10-07 (×4): qty 1

## 2019-10-07 MED ORDER — VITAMIN K1 10 MG/ML IJ SOLN
5.0000 mg | Freq: Once | INTRAVENOUS | Status: AC
  Administered 2019-10-07: 5 mg via INTRAVENOUS
  Filled 2019-10-07: qty 0.5

## 2019-10-07 MED ORDER — SODIUM CHLORIDE 0.9 % IV SOLN: INTRAVENOUS | Status: AC

## 2019-10-07 MED ORDER — MAGNESIUM SULFATE 2 GM/50ML IV SOLN
2.0000 g | Freq: Once | INTRAVENOUS | Status: AC
  Administered 2019-10-07: 11:00:00 2 g via INTRAVENOUS
  Filled 2019-10-07: qty 50

## 2019-10-07 NOTE — Progress Notes (Signed)
PROGRESS NOTE  Larry Andrade. BOF:[id number] DOB: 05-Apr-1961 DOA: 10/06/2019 PCP: Rutherford Limerick, PA   LOS: 1 day   Brief Narrative / Interim history: 59 year old male with history of CHF, alcoholic liver cirrhosis, GERD, gout, tobacco abuse, hypertension, prior episodes of pancreatitis came to the hospital with new onset bloody vomitus and coffee-ground emesis along with black stools.  He is also been complaining of shortness of breath with his vomiting.  Gastroenterology was consulted.  Subjective / 24h Interval events: He is doing a little bit better this morning, no longer had any vomiting overnight.  He denies any chest pain abdominal pain.  No diarrhea overnight.  Assessment & Plan: Principal Problem Acute blood loss anemia due to presumed upper GI bleeding -Anemia also multifactorial due to EtOH abuse, continue multivitamin -Gastroenterology consulted, appreciate input.  For now continue IV Protonix infusion, follow serial hemoglobin and hematocrit -Keep n.p.o. until seen by GI -Most recent endoscopy in October 2020 showed portal hypertensive gastropathy, no evidence of esophageal varices.  Currently off octreotide  Active Problems Alcohol abuse -Patient's last alcoholic drink was about 1-2 weeks ago, currently does not have any apparent withdrawal symptoms continue to closely monitor  Acute kidney injury -Likely in the setting of acute illness, given chronic lower extremity swelling he were not started on fluids on admission, will cautiously do IV fluids for today  Chronic diastolic CHF -Most recent 2D echo done in 2020 with normal EF, he has a degree of chronic lower extremity swelling which I suspect may be in part due to his liver disease/hypoalbuminemia  Hypokalemia/hypomagnesemia -continue to aggressively replete  Elevated lactic acid -Suspect in the setting of impaired liver clearance, improved overnight and currently at 3.5, repeat after additional  fluids  Thrombocytopenia -Likely in the setting of liver disease, monitor platelets  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 amiodarone  200 mg Oral Daily  . metoprolol succinate  25 mg Oral BID  . multivitamin with minerals  1 tablet Oral Daily  . [START ON 10/10/2019] pantoprazole  40 mg Intravenous Q12H  . potassium chloride  40 mEq Oral Once  . sodium chloride flush  3 mL Intravenous Q12H   Continuous Infusions: . sodium chloride    . pantoprozole (PROTONIX) infusion Stopped (10/06/19 2203)  . pantoprozole (PROTONIX) infusion 8 mg/hr (10/07/19 0843)   PRN Meds:.sodium chloride, acetaminophen **OR** acetaminophen, albuterol, magnesium hydroxide, ondansetron **OR** ondansetron (ZOFRAN) IV, oxyCODONE-acetaminophen, sodium chloride flush, traZODone  DVT prophylaxis: SCDs Code Status: Full code Family Communication: No family present, discussed with patient Patient admitted from: Home Anticipated d/c place: Home Barriers to d/c: Hemoglobin trending down, continue n.p.o. and awaiting GI evaluation.  Will need monitoring in the hospital setting for the time being  Consultants:  Gastroenterology  Procedures:  None   Microbiology  None   Antimicrobials: None     Objective: Vitals:   10/07/19 0700 10/07/19 0730 10/07/19 0800 10/07/19 0900  BP: 118/86 126/80 126/78 121/90  Pulse: 98 (!) 101  94  Resp: 17 18 19 19   Temp:    98.2 F (36.8 C)  TempSrc:    Oral  SpO2: 96% 96%  97%  Weight:        Intake/Output Summary (Last 24 hours) at 10/07/2019 1009 Last data filed at 10/07/2019 0843 Gross per 24 hour  Intake 674.48 ml  Output 625 ml  Net 49.48 ml   Filed Weights   10/06/19 1857  Weight: 83 kg    Examination:  Constitutional: NAD Eyes: Mild  scleral icterus ENMT: Mucous membranes are moist.  Neck: normal, supple Respiratory: clear to auscultation bilaterally, no wheezing, no crackles.  Cardiovascular: Regular rate and rhythm, no murmurs / rubs / gallops.  1+ pitting LE edema.  Abdomen: non distended, no tenderness. Bowel sounds positive.  Musculoskeletal: no clubbing / cyanosis.  Skin: no rashes Neurologic: Nonfocal, equal strength   Data Reviewed: I have independently reviewed following labs and imaging studies   CBC: Recent Labs  Lab 10/06/19 1858 10/07/19 0513  WBC 12.3* 7.1  NEUTROABS 9.1*  --   HGB 8.0* 7.6*  HCT 24.4* 22.9*  MCV 110.4* 108.5*  PLT 122* 103*   Basic Metabolic Panel: Recent Labs  Lab 10/06/19 1858 10/07/19 0513  NA 138 137  K 3.2* 3.4*  CL 95* 95*  CO2 23 30  GLUCOSE 96 89  BUN 32* 37*  CREATININE 1.21 1.42*  CALCIUM 8.2* 7.9*  MG  --  1.2*   Liver Function Tests: Recent Labs  Lab 10/06/19 1858 10/07/19 0513  AST 89* 64*  ALT 27 22  ALKPHOS 312* 275*  BILITOT 5.1* 4.2*  PROT 7.2 6.7  ALBUMIN 2.5* 2.5*   Coagulation Profile: Recent Labs  Lab 10/06/19 1858  INR 1.4*   HbA1C: No results for input(s): HGBA1C in the last 72 hours. CBG: No results for input(s): GLUCAP in the last 168 hours.  Recent Results (from the past 240 hour(s))  Blood culture (routine x 2)     Status: None (Preliminary result)   Collection Time: 10/06/19  6:58 PM   Specimen: BLOOD  Result Value Ref Range Status   Specimen Description BLOOD BLOOD LEFT FOREARM  Final   Special Requests   Final    BOTTLES DRAWN AEROBIC AND ANAEROBIC Blood Culture adequate volume   Culture   Final    NO GROWTH < 12 HOURS Performed at [facility], [address] [place], [place] [address]    Report Status PENDING  Incomplete  Blood culture (routine x 2)     Status: None (Preliminary result)   Collection Time: 10/06/19  8:34 PM   Specimen: BLOOD  Result Value Ref Range Status   Specimen Description  BLOOD BLOOD RIGHT FOREARM  Final   Special Requests   Final    BOTTLES DRAWN AEROBIC AND ANAEROBIC Blood Culture adequate volume   Culture   Final    NO GROWTH < 12 HOURS Performed at [facility], [address] [place], [place] [address]    Report Status PENDING  Incomplete  Respiratory Panel by RT PCR (Flu A&B, Covid) - Nasopharyngeal Swab     Status: None   Collection Time: 10/06/19 10:00 PM   Specimen: Nasopharyngeal Swab  Result Value Ref Range Status   SARS Coronavirus 2 by RT PCR NEGATIVE NEGATIVE Final    Comment: (NOTE) SARS-CoV-2 target nucleic acids are NOT DETECTED. The SARS-CoV-2 RNA is generally detectable in upper respiratoy specimens during the acute phase of infection. The lowest concentration of SARS-CoV-2 viral copies this assay can detect is 131 copies/mL. A negative result does not preclude SARS-Cov-2 infection and should not be used as the sole basis for treatment or other patient management decisions. A negative result may occur with  improper specimen collection/handling, submission of specimen other than nasopharyngeal swab, presence of viral  mutation(s) within the areas targeted by this assay, and inadequate number of viral copies (<131 copies/mL). A negative result must be combined with clinical observations, patient history, and epidemiological information. The expected result is Negative. Fact Sheet for Patients:  https://www.moore.com/ Fact Sheet for Healthcare Providers:  https://www.young.biz/ This test is not yet ap proved or cleared by the [place] FDA and  has been authorized for detection and/or diagnosis of SARS-CoV-2 by FDA under an Emergency Use Authorization (EUA). This EUA will remain  in effect (meaning this test can be used) for the duration of the COVID-19 declaration under Section 564(b)(1) of the Act, 21 U.S.C. section 360bbb-3(b)(1), unless the authorization is terminated  or revoked sooner.    Influenza A by PCR NEGATIVE NEGATIVE Final   Influenza B by PCR NEGATIVE NEGATIVE Final    Comment: (NOTE) The Xpert Xpress SARS-CoV-2/FLU/RSV assay is intended as an aid in  the diagnosis of influenza from Nasopharyngeal swab specimens and  should not be used as a sole basis for treatment. Nasal washings and  aspirates are unacceptable for Xpert Xpress SARS-CoV-2/FLU/RSV  testing. Fact Sheet for Patients: https://www.moore.com/ Fact Sheet for Healthcare Providers: https://www.young.biz/ This test is not yet approved or cleared by the [place] FDA and  has been authorized for detection and/or diagnosis of SARS-CoV-2 by  FDA under an Emergency Use Authorization (EUA). This EUA will remain  in effect (meaning this test can be used) for the duration of the  Covid-19 declaration under Section 564(b)(1) of the Act, 21  U.S.C. section 360bbb-3(b)(1), unless the authorization is  terminated or revoked. Performed at [facility], [address] [place], [place] [address]      Radiology Studies: DG Chest Portable 1 View  Result Date: 10/06/2019 CLINICAL DATA:  Shortness of breath. EXAM: PORTABLE CHEST 1 VIEW COMPARISON:  April 06, 2019 FINDINGS: Mild diffuse chronic appearing increased lung markings are seen without evidence of acute infiltrate, pleural effusion or pneumothorax. The heart size and mediastinal contours are within normal limits. Multiple chronic right rib fractures are seen. IMPRESSION: No active disease. Electronically Signed   By: Aram Candela M.D.   On: 10/06/2019 19:35   Pamella Pert, MD, PhD [facility]  Between 7 am - 7 pm I am available, please contact me via Amion or Securechat  Between 7 pm - 7 am I am not available, please contact night coverage MD/APP via Amion

## 2019-10-07 NOTE — ED Notes (Signed)
Pt c/o 8/10 ankle and wrist pain. Pt states this is chronic.

## 2019-10-07 NOTE — Progress Notes (Signed)
Upon arrival to this unit, noted that patient had orders for stepdown, supervisor and provider notified and patient reassessed and deemed to be appropriate for this unit at this time.

## 2019-10-07 NOTE — ED Notes (Signed)
Pt given clean urinal

## 2019-10-07 NOTE — ED Notes (Signed)
Pt given ice chips and TV adjusted

## 2019-10-07 NOTE — Consult Note (Signed)
Midge Minium, MD [facility]  [address] [place], [place] [address] Phone: [phone] Fax : [phone]  Consultation  Referring Provider:     Dr. Nida Boatman Primary Care Physician:  Gildardo Pounds, PA Primary Gastroenterologist:  Dr. Tobi Bastos         Reason for Consultation:     Hematemesis  Date of Admission:  10/06/2019 Date of Consultation:  10/07/2019         HPI:   Vencil Basnett. is a 59 y.o. male with a history of alcoholic cirrhosis who is followed up with Dr. Renaee Munda in the past and also reports that he is seen at [facility] hepatology.  The patient states that he has been told many times to stop drinking.  He reports that he has approximately 3 mixed drinks a day usually 3 days a week although the woman with him today who appears to be his spouse reports that she thinks it may be more.  The patient has been having pain in his hand and went to his community doctor who put him on NSAIDs for his hands.  The patient had an upper endoscopy in 2019 and then another endoscopy 6 months ago without any varices seen at either 1 of those EGDs.  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  He reports that this was done for 2 different masses found in his colon.  The patient had a sigmoidoscopy in October 2020 that was normal.  This was done by Dr. Tobi Bastos.  He now states that he was having some black stools that started on Saturday which was yesterday and it was black diarrhea and reported that he had taken some Imodium and has not had any further black stools or diarrhea.  He states he has not moved his bowels at all since coming to the hospital.  He was admitted with shortness of breath and reports that he had bloody vomitus with coffee-ground emesis prior to coming to the hospital.  The patient's hemoglobin and hematocrit are around his baseline at the present time.  And the pattern has shown:  Component     Latest Ref Rng & Units 04/07/2019  04/07/2019 04/08/2019 04/08/2019         8:01 AM  5:41 PM  4:30 AM  4:54 PM  Hemoglobin     13.0 - 17.0 g/dL 6.8 (L) 8.1 (L) 7.1 (L) 7.9 (L)  HCT     39.0 - 52.0 % 19.9 (L) 24.1 (L) 20.4 (L) 23.6 (L)   Component     Latest Ref Rng & Units 04/09/2019 10/06/2019 10/07/2019            Hemoglobin     13.0 - 17.0 g/dL 7.8 (L) 8.0 (L) 7.6 (L)  HCT     39.0 - 52.0 % 22.8 (L) 24.4 (L) 22.9 (L)    Past Medical History:  Diagnosis Date  . CHF (congestive heart failure) (HCC)   . Cirrhosis (HCC)   . GERD (gastroesophageal reflux disease)   . Gout   . Hypertension   . Pancreatitis     Past Surgical History:  Procedure Laterality Date  . CARDIAC CATHETERIZATION    . COLON SURGERY    . ESOPHAGOGASTRODUODENOSCOPY N/A 04/08/2019   Procedure: ESOPHAGOGASTRODUODENOSCOPY (EGD);  Surgeon: [name], MD;  Location[facility] ENDOSCOPY;  Service: Gastroenterology;  Laterality: N/A;  . ESOPHAGOGASTRODUODENOSCOPY (EGD) WITH PROPOFOL N/A 10/26/2017   Procedure: ESOPHAGOGASTRODUODENOSCOPY (EGD) WITH PROPOFOL;  Surgeon: Jonathon Bellows, MD;  Location: [facility] ENDOSCOPY;  Service: Endoscopy;  Laterality: N/A;  . FLEXIBLE SIGMOIDOSCOPY N/A 04/08/2019   Procedure: FLEXIBLE SIGMOIDOSCOPY;  Surgeon: Jonathon Bellows, MD;  Location: [facility] ENDOSCOPY;  Service: Gastroenterology;  Laterality: N/A;  . I & D EXTREMITY Right 09/05/2018   Procedure: IRRIGATION AND DEBRIDEMENT EXTREMITY;  Surgeon: Thornton Park, MD;  Location: [facility] ORS;  Service: Orthopedics;  Laterality: Right;  . KNEE ARTHROSCOPY Right 08/26/2018   Procedure: ARTHROSCOPY KNEE;  Surgeon: Thornton Park, MD;  Location: [facility] ORS;  Service: Orthopedics;  Laterality: Right;    Prior to Admission medications   Medication Sig Start Date End Date Taking? Authorizing Provider  albuterol (PROVENTIL) (2.5 MG/3ML) 0.083% nebulizer solution Take 2.5 mg by nebulization every 6 (six) hours as needed for wheezing or shortness of breath.   Yes [provider]    amiodarone (PACERONE) 200 MG tablet Take 1 tablet (200 mg total) by mouth daily. 04/09/19  Yes Max Sane, MD  metoprolol succinate (TOPROL-XL) 25 MG 24 hr tablet Take 25 mg by mouth 2 (two) times daily. 01/15/19  Yes [provider]  Multiple Vitamin (MULTIVITAMIN WITH MINERALS) TABS tablet Take 1 tablet by mouth daily. 10/26/17   Loletha Grayer, MD    Family History  Problem Relation Age of Onset  . CVA Mother   . CAD Mother   . CAD Father      Social History   Tobacco Use  . Smoking status: Current Every Day Smoker    Packs/day: 1.00    Types: Cigarettes  . Smokeless tobacco: Never Used  Substance Use Topics  . Alcohol use: Yes    Comment: 1 pint of vodka a day  . Drug use: No    Allergies as of 10/06/2019 - Review Complete 10/06/2019  Allergen Reaction Noted  . Penicillins Rash 02/22/2017    Review of Systems:    All systems reviewed and negative except where noted in HPI.   Physical Exam:  Vital signs in last 24 hours: Temp:  [98 F (36.7 C)-98.2 F (36.8 C)] 98.2 F (36.8 C) (04/11 1100) Pulse Rate:  [94-133] 97 (04/11 1100) Resp:  [14-23] 18 (04/11 1100) BP: (115-147)/(70-118) 120/89 (04/11 1100) SpO2:  [90 %-100 %] 98 % (04/11 1100) Weight:  [83 kg] 83 kg (04/10 1857) Last BM Date: 10/06/19 General:   Pleasant, cooperative in NAD Head:  Normocephalic and atraumatic. Eyes:   No icterus.   Conjunctiva pink. PERRLA. Ears:  Normal auditory acuity. Neck:  Supple; no masses or thyroidomegaly Lungs: Respirations even and unlabored. Lungs clear to auscultation bilaterally.   No wheezes, crackles, or rhonchi.  Heart:  Regular rate and rhythm;  Without murmur, clicks, rubs or gallops Abdomen:  Soft, positive midline scar, nondistended, nontender. Normal bowel sounds. No appreciable masses or hepatomegaly.  No rebound or guarding.  Rectal:  Not performed. Msk:  Symmetrical without gross deformities.    Extremities:  Without edema, cyanosis or  clubbing. Neurologic:  Alert and oriented x3;  grossly normal neurologically. Skin:  Intact without significant lesions or rashes. Cervical Nodes:  No significant cervical adenopathy. Psych:  Alert and cooperative. Normal affect.  LAB RESULTS: Recent Labs    10/06/19 1858 10/07/19 0513  WBC 12.3* 7.1  HGB 8.0* 7.6*  HCT 24.4* 22.9*  PLT 122* 103*   BMET Recent Labs    10/06/19 1858 10/07/19 0513  NA 138 137  K 3.2* 3.4*  CL 95* 95*  CO2 23 30  GLUCOSE 96 89  BUN 32* 37*  CREATININE 1.21 1.42*  CALCIUM 8.2* 7.9*   LFT Recent Labs    10/07/19 0513  PROT 6.7  ALBUMIN 2.5*  AST 64*  ALT 22  ALKPHOS 275*  BILITOT 4.2*   PT/INR Recent Labs    10/06/19 1858  LABPROT 16.7*  INR 1.4*    STUDIES: DG Chest Portable 1 View  Result Date: 10/06/2019 CLINICAL DATA:  Shortness of breath. EXAM: PORTABLE CHEST 1 VIEW COMPARISON:  April 06, 2019 FINDINGS: Mild diffuse chronic appearing increased lung markings are seen without evidence of acute infiltrate, pleural effusion or pneumothorax. The heart size and mediastinal contours are within normal limits. Multiple chronic right rib fractures are seen. IMPRESSION: No active disease. Electronically Signed   By: Aram Candela M.D.   On: 10/06/2019 19:35      Impression / Plan:   Assessment: Active Problems:   Acute upper GI bleeding   COPD exacerbation (HCC)   Dominion Kathan. is a 59 y.o. y/o male with alcoholic cirrhosis who continues to drink and had some black stool after being started on anti-inflammatory medication.  The patient has been told to stop all NSAIDs due to the risk of bleeding and the risk of renal failure with cirrhosis.  The patient has had no further signs of melena and has had no further hematemesis.  He has undergone 2 EGDs with the last one being 6 months ago that did not show any signs of varices.  Plan:  The patient has been stable with a stable hemoglobin.  The patient has been told to not  drink any further since he is presently drinking greater than 3 mixed drinks a day multiple times a week.  The patient has also been told to stop his anti-inflammatory medication.  His MCV was high and the patient should be treated with folate and B12.  I agree with treating this patient with a PPI which she is now receiving. Thank you for involving me in the care of this patient.  Reconsideration for any endoscopic procedure will be made if the patient shows any sign of repeat bleeding.  The patient has been explained the plan agrees with it.    LOS: 1 day   Midge Minium, MD  10/07/2019, 1:53 PM Pager [phone] 7am-5pm  Check AMION for 5pm -7am coverage and on weekends   Note: This dictation was prepared with Dragon dictation along with smaller phrase technology. Any transcriptional errors that result from this process are unintentional.

## 2019-10-07 NOTE — ED Notes (Signed)
Report received from Noel, RN. 

## 2019-10-08 DIAGNOSIS — D539 Nutritional anemia, unspecified: Secondary | ICD-10-CM

## 2019-10-08 DIAGNOSIS — D696 Thrombocytopenia, unspecified: Secondary | ICD-10-CM | POA: Diagnosis not present

## 2019-10-08 DIAGNOSIS — D62 Acute posthemorrhagic anemia: Secondary | ICD-10-CM

## 2019-10-08 DIAGNOSIS — I5032 Chronic diastolic (congestive) heart failure: Secondary | ICD-10-CM | POA: Diagnosis not present

## 2019-10-08 DIAGNOSIS — K922 Gastrointestinal hemorrhage, unspecified: Secondary | ICD-10-CM | POA: Diagnosis not present

## 2019-10-08 DIAGNOSIS — K7030 Alcoholic cirrhosis of liver without ascites: Secondary | ICD-10-CM

## 2019-10-08 LAB — COMPREHENSIVE METABOLIC PANEL
ALT: 18 U/L (ref 0–44)
AST: 59 U/L — ABNORMAL HIGH (ref 15–41)
Albumin: 2.2 g/dL — ABNORMAL LOW (ref 3.5–5.0)
Alkaline Phosphatase: 238 U/L — ABNORMAL HIGH (ref 38–126)
Anion gap: 9 (ref 5–15)
BUN: 33 mg/dL — ABNORMAL HIGH (ref 6–20)
CO2: 31 mmol/L (ref 22–32)
Calcium: 7.8 mg/dL — ABNORMAL LOW (ref 8.9–10.3)
Chloride: 96 mmol/L — ABNORMAL LOW (ref 98–111)
Creatinine, Ser: 1.53 mg/dL — ABNORMAL HIGH (ref 0.61–1.24)
GFR calc Af Amer: 58 mL/min — ABNORMAL LOW (ref >60)
GFR calc non Af Amer: 50 mL/min — ABNORMAL LOW (ref >60)
Glucose, Bld: 85 mg/dL (ref 70–99)
Potassium: 3.8 mmol/L (ref 3.5–5.1)
Sodium: 136 mmol/L (ref 135–145)
Total Bilirubin: 3.6 mg/dL — ABNORMAL HIGH (ref 0.3–1.2)
Total Protein: 6 g/dL — ABNORMAL LOW (ref 6.5–8.1)

## 2019-10-08 LAB — CBC
HCT: 19.7 % — ABNORMAL LOW (ref 39.0–52.0)
Hemoglobin: 6.8 g/dL — ABNORMAL LOW (ref 13.0–17.0)
MCH: 36.8 pg — ABNORMAL HIGH (ref 26.0–34.0)
MCHC: 34.5 g/dL (ref 30.0–36.0)
MCV: 106.5 fL — ABNORMAL HIGH (ref 80.0–100.0)
Platelets: 80 10*3/uL — ABNORMAL LOW (ref 150–400)
RBC: 1.85 MIL/uL — ABNORMAL LOW (ref 4.22–5.81)
RDW: 16.7 % — ABNORMAL HIGH (ref 11.5–15.5)
WBC: 5.2 10*3/uL (ref 4.0–10.5)
nRBC: 0 % (ref 0.0–0.2)

## 2019-10-08 LAB — MAGNESIUM: Magnesium: 1.7 mg/dL (ref 1.7–2.4)

## 2019-10-08 LAB — PHOSPHORUS: Phosphorus: 2.7 mg/dL (ref 2.5–4.6)

## 2019-10-08 LAB — PREPARE RBC (CROSSMATCH)

## 2019-10-08 MED ORDER — MAGNESIUM SULFATE 2 GM/50ML IV SOLN
2.0000 g | Freq: Once | INTRAVENOUS | Status: AC
  Administered 2019-10-08: 13:00:00 2 g via INTRAVENOUS
  Filled 2019-10-08: qty 50

## 2019-10-08 MED ORDER — SODIUM CHLORIDE 0.9% IV SOLUTION: Freq: Once | INTRAVENOUS | Status: AC

## 2019-10-08 MED ORDER — FUROSEMIDE 10 MG/ML IJ SOLN
40.0000 mg | Freq: Once | INTRAMUSCULAR | Status: AC
  Administered 2019-10-08: 40 mg via INTRAVENOUS
  Filled 2019-10-08: qty 4

## 2019-10-08 MED ORDER — SODIUM CHLORIDE 0.9 % IV SOLN: INTRAVENOUS | Status: DC

## 2019-10-08 MED ORDER — MAGNESIUM SULFATE 2 GM/50ML IV SOLN
2.0000 g | Freq: Once | INTRAVENOUS | Status: AC
  Administered 2019-10-08: 09:00:00 2 g via INTRAVENOUS
  Filled 2019-10-08: qty 50

## 2019-10-08 MED ORDER — SODIUM CHLORIDE 0.9 % IV SOLN: INTRAVENOUS | Status: AC

## 2019-10-08 MED ORDER — POLYETHYLENE GLYCOL 3350 17 G PO PACK
17.0000 g | PACK | Freq: Every day | ORAL | Status: DC
  Administered 2019-10-08: 17:00:00 17 g via ORAL
  Filled 2019-10-08 (×2): qty 1

## 2019-10-08 MED ORDER — FOLIC ACID 1 MG PO TABS
1.0000 mg | ORAL_TABLET | Freq: Every day | ORAL | Status: DC
  Administered 2019-10-08 – 2019-10-10 (×3): 1 mg via ORAL
  Filled 2019-10-08 (×3): qty 1

## 2019-10-08 NOTE — Progress Notes (Signed)
Patient ID: Larry Andrade., male   DOB: [birth year], 59 y.o.   MRN: [id number]  PROGRESS NOTE    Brown Human.  GEX:[id number] DOB: 1961-03-09 DOA: 10/06/2019 PCP: Gildardo Pounds, PA (Confirm with patient/family/NH records and if not entered, this HAS to be entered at [facility] point of entry. "No PCP" if truly none.)   Brief Narrative: (Start on day 1 of progress note - keep it brief and live) Patient is a 59 year old male with history of congestive heart failure, alcoholic liver cirrhosis, hypertension history of pancreatitis who was admitted to the hospital on 10/06/2019 with coffee-ground emesis.  Patient was admitted 6 months ago with the same diagnosis.  At that time, he had EGD, showed portal hypertensive gastropathy, no evidence of esophageal varices.  Patient last drink alcohol was a last Wednesday.  Since admission to the hospital, patient has not had a bowel movement.  He has been able to tolerate a liquid diet.  No nausea or vomiting. He has some chronic leg pain, leg swelling, he is taking some as needed pain medicine.  Spoke with the nurse, no event overnight.  No additional GI bleed.  Receive phone call from RN, Mag level at 0.9   Assessment & Plan: #1.  Acute blood loss anemia secondary to upper GI bleed.  Patient has history of alcohol abuse.  Has been evaluated by GI, currently still on Protonix.  No indication for octreotide as patient had no esophageal varices from previous EGD.  Hemoglobin dropped down to 6.8 today, will transfuse 1 unit of PRBC.  However, patient has not have any bowel movement since admission, no nausea vomiting.  Still pending for B12 level.  Will ask GI to see patient again today.  #2.  Hypomagnesemia. Magnesium level dropped down to 0.9 today, will give 4 g of IV magnesium sulfate.  Repeat level tomorrow.  #3.  Alcohol abuse with alcohol liver cirrhosis.  Currently patient does not have any symptoms of alcohol withdrawal.  #4.  Acute kidney injury.   Slightly worsening renal function today.  Gentle rehydration.  Patient currently not on any diuretics.   #5.  Lactic acidosis.  Patient had very high lactic acid level at time of admission, gradually getting better.  This is secondary to chronic liver disease.  No evidence of infection.  #6.  Thrombocytopenia.  Secondary to liver cirrhosis.  Follow.   #7.  Chronic diastolic congestive heart failure. Currently stable.  No evidence of congestive heart of exacerbation at this time.  8.  Paroxysmal atrial fibrillation. Continue amiodarone and metoprolol.  #9.  Essential hypertension.  Continue metoprolol.  #10. hypoalbuminemia due to liver cirrhosis.  Follow.     DVT prophylaxis: SCDs Code Status: Full Code Family Communication: Discussed with the patient. Disposition Plan:  . Discharge to home when hemoglobin is stable.   Consultants:   Gastroenterology   Procedures: None  Antimicrobials: None  Subjective: Patient feels well today, denies any shortness of breath.  No abdominal pain.  He had a similar leg swelling and chronic leg pain. No nausea vomiting.  Has not had a bowel movement, taking stool softeners.  Objective: Vitals:   10/08/19 0210 10/08/19 0400 10/08/19 0455 10/08/19 0747  BP:  (!) 104/55  109/69  Pulse:  92 88 94  Resp:  15 17 20   Temp:    98 F (36.7 C)  TempSrc:      SpO2: 98% 97% 96% 99%  Weight:      Height:  Intake/Output Summary (Last 24 hours) at 10/08/2019 0751 Last data filed at 10/08/2019 0413 Gross per 24 hour  Intake 614.91 ml  Output 300 ml  Net 314.91 ml   Filed Weights   10/06/19 1857 10/07/19 1458  Weight: 83 kg 83.5 kg    Examination:  General exam: Appears calm and comfortable  Respiratory system: Clear to auscultation. Respiratory effort normal. Cardiovascular system: S1 & S2 heard, RRR. No JVD, murmurs, rubs, gallops or clicks. No pedal edema. Gastrointestinal system: Abdomen is nondistended, soft and nontender.   Small ventral hernia present, significant hepatomegaly. Central nervous system: Alert and oriented. No focal neurological deficits. Extremities: Symmetric 5 x 5 power. 1+ LE edema bilaterally.  Skin: No rashes, lesions or ulcers Psychiatry: Judgement and insight appear normal. Mood & affect appropriate.     Data Reviewed: I have personally reviewed following labs and imaging studies  CBC: Recent Labs  Lab 10/06/19 1858 10/07/19 0513 10/07/19 1458 10/07/19 1746 10/08/19 0618  WBC 12.3* 7.1  --   --  5.2  NEUTROABS 9.1*  --   --   --   --   HGB 8.0* 7.6* 7.6* 7.4* 6.8*  HCT 24.4* 22.9* 22.5* 22.1* 19.7*  MCV 110.4* 108.5*  --   --  106.5*  PLT 122* 103*  --   --  80*   Basic Metabolic Panel: Recent Labs  Lab 10/06/19 1858 10/07/19 0513 10/08/19 0618  NA 138 137 136  K 3.2* 3.4* 3.8  CL 95* 95* 96*  CO2 23 30 31   GLUCOSE 96 89 85  BUN 32* 37* 33*  CREATININE 1.21 1.42* 1.53*  CALCIUM 8.2* 7.9* 7.8*  MG  --  1.2* 1.7  PHOS  --   --  2.7   GFR: Estimated Creatinine Clearance: 60.9 mL/min (A) (by C-G formula based on SCr of 1.53 mg/dL (H)). Liver Function Tests: Recent Labs  Lab 10/06/19 1858 10/07/19 0513 10/08/19 0618  AST 89* 64* 59*  ALT 27 22 18   ALKPHOS 312* 275* 238*  BILITOT 5.1* 4.2* 3.6*  PROT 7.2 6.7 6.0*  ALBUMIN 2.5* 2.5* 2.2*   No results for input(s): LIPASE, AMYLASE in the last 168 hours. No results for input(s): AMMONIA in the last 168 hours. Coagulation Profile: Recent Labs  Lab 10/06/19 1858  INR 1.4*   Cardiac Enzymes: No results for input(s): CKTOTAL, CKMB, CKMBINDEX, TROPONINI in the last 168 hours. BNP (last 3 results) No results for input(s): PROBNP in the last 8760 hours. HbA1C: No results for input(s): HGBA1C in the last 72 hours. CBG: No results for input(s): GLUCAP in the last 168 hours. Lipid Profile: No results for input(s): CHOL, HDL, LDLCALC, TRIG, CHOLHDL, LDLDIRECT in the last 72 hours. Thyroid Function Tests: No  results for input(s): TSH, T4TOTAL, FREET4, T3FREE, THYROIDAB in the last 72 hours. Anemia Panel: No results for input(s): VITAMINB12, FOLATE, FERRITIN, TIBC, IRON, RETICCTPCT in the last 72 hours. Sepsis Labs: Recent Labs  Lab 10/06/19 1858 10/06/19 2034 10/07/19 0513  LATICACIDVEN 9.2* 8.1* 3.5*    Recent Results (from the past 240 hour(s))  Blood culture (routine x 2)     Status: None (Preliminary result)   Collection Time: 10/06/19  6:58 PM   Specimen: BLOOD  Result Value Ref Range Status   Specimen Description BLOOD BLOOD LEFT FOREARM  Final   Special Requests   Final    BOTTLES DRAWN AEROBIC AND ANAEROBIC Blood Culture adequate volume   Culture   Final    NO GROWTH  2 DAYS Performed at [facility], [address] [place], [place] [address]    Report Status PENDING  Incomplete  Blood culture (routine x 2)     Status: None (Preliminary result)   Collection Time: 10/06/19  8:34 PM   Specimen: BLOOD  Result Value Ref Range Status   Specimen Description BLOOD BLOOD RIGHT FOREARM  Final   Special Requests   Final    BOTTLES DRAWN AEROBIC AND ANAEROBIC Blood Culture adequate volume   Culture   Final    NO GROWTH 2 DAYS Performed at [facility], [address], [place], [place] [address]    Report Status PENDING  Incomplete  Respiratory Panel by RT PCR (Flu A&B, Covid) - Nasopharyngeal Swab     Status: None   Collection Time: 10/06/19 10:00 PM   Specimen: Nasopharyngeal Swab  Result Value Ref Range Status   SARS Coronavirus 2 by RT PCR NEGATIVE NEGATIVE Final    Comment: (NOTE) SARS-CoV-2 target nucleic acids are NOT DETECTED. The SARS-CoV-2 RNA is generally detectable in upper respiratoy specimens during the acute phase of infection. The lowest concentration of SARS-CoV-2 viral copies this assay can detect is 131 copies/mL. A negative result does not preclude SARS-Cov-2 infection and should not be used as the sole basis for treatment or other  patient management decisions. A negative result may occur with  improper specimen collection/handling, submission of specimen other than nasopharyngeal swab, presence of viral mutation(s) within the areas targeted by this assay, and inadequate number of viral copies (<131 copies/mL). A negative result must be combined with clinical observations, patient history, and epidemiological information. The expected result is Negative. Fact Sheet for Patients:  https://www.moore.com/ Fact Sheet for Healthcare Providers:  https://www.young.biz/ This test is not yet ap proved or cleared by the [place] FDA and  has been authorized for detection and/or diagnosis of SARS-CoV-2 by FDA under an Emergency Use Authorization (EUA). This EUA will remain  in effect (meaning this test can be used) for the duration of the COVID-19 declaration under Section 564(b)(1) of the Act, 21 U.S.C. section 360bbb-3(b)(1), unless the authorization is terminated or revoked sooner.    Influenza A by PCR NEGATIVE NEGATIVE Final   Influenza B by PCR NEGATIVE NEGATIVE Final    Comment: (NOTE) The Xpert Xpress SARS-CoV-2/FLU/RSV assay is intended as an aid in  the diagnosis of influenza from Nasopharyngeal swab specimens and  should not be used as a sole basis for treatment. Nasal washings and  aspirates are unacceptable for Xpert Xpress SARS-CoV-2/FLU/RSV  testing. Fact Sheet for Patients: https://www.moore.com/ Fact Sheet for Healthcare Providers: https://www.young.biz/ This test is not yet approved or cleared by the [place] FDA and  has been authorized for detection and/or diagnosis of SARS-CoV-2 by  FDA under an Emergency Use Authorization (EUA). This EUA will remain  in effect (meaning this test can be used) for the duration of the  Covid-19 declaration under Section 564(b)(1) of the Act, 21  U.S.C. section 360bbb-3(b)(1), unless  the authorization is  terminated or revoked. Performed at [facility], [address] [place], [place] [address]          Radiology Studies: DG Chest Portable 1 View  Result Date: 10/06/2019 CLINICAL DATA:  Shortness of breath. EXAM: PORTABLE CHEST 1 VIEW COMPARISON:  April 06, 2019 FINDINGS: Mild diffuse chronic appearing increased lung markings are seen without evidence of acute infiltrate, pleural effusion or pneumothorax. The heart size and mediastinal contours are within normal limits.  Multiple chronic right rib fractures are seen. IMPRESSION: No active disease. Electronically Signed   By: Virgina Norfolk M.D.   On: 10/06/2019 19:35        Scheduled Meds: . sodium chloride   Intravenous Once  . amiodarone  200 mg Oral Daily  . metoprolol succinate  25 mg Oral BID  . multivitamin with minerals  1 tablet Oral Daily  . [START ON 10/10/2019] pantoprazole  40 mg Intravenous Q12H  . potassium chloride  40 mEq Oral Once  . sodium chloride flush  3 mL Intravenous Q12H  . vitamin B-12  100 mcg Oral Daily   Continuous Infusions: . sodium chloride    . magnesium sulfate bolus IVPB    . pantoprozole (PROTONIX) infusion 8 mg/hr (10/08/19 0519)     LOS: 2 days    Time spent: 50 minutes    Sharen Hones, MD [facility]   To contact the attending provider between 7A-7P or the covering provider during after hours 7P-7A, please log into the web site [email or web address] and access using universal [place] password for that web site. If you do not have the password, please call the hospital operator.  10/08/2019, 7:51 AM

## 2019-10-08 NOTE — TOC Initial Note (Signed)
Transition of Care (TOC) - Initial/Assessment Note    Patient Details  Name: Larry Andrade. MRN: [id number] Date of Birth: [birth year]  Transition of Care [facility]) CM/SW Contact:    Shelbie Ammons, RN Phone Number: 10/08/2019, 2:15 PM  Clinical Narrative:    RNCM met with patient at bedside, explained CM role and patient was agreeable to answer questions. Patient reports that he lives at home with his significant other of 20 years, he still drives and is able to get himself back and forth to the [place] as well as to get his medications which he picks up in [place]. Patient denies any problems with being able to afford his medications. Patient reports that he has a walker and bedside commode at home and does not feel he needs any other equipment. RNCM will remain available for any further needs.                Expected Discharge Plan: Home/Self Care Barriers to Discharge: Continued Medical Work up   Patient Goals and CMS Choice        Expected Discharge Plan and Services Expected Discharge Plan: Home/Self Care   Discharge Planning Services: CM Consult   Living arrangements for the past 2 months: Single Family Home                                      Prior Living Arrangements/Services Living arrangements for the past 2 months: Single Family Home Lives with:: Significant Other Patient language and need for interpreter reviewed:: Yes        Need for Family Participation in Patient Care: Yes (Comment) Care giver support system in place?: Yes (comment)   Criminal Activity/Legal Involvement Pertinent to Current Situation/Hospitalization: No - Comment as needed  Activities of Daily Living Home Assistive Devices/Equipment: None ADL Screening (condition at time of admission) Patient's cognitive ability adequate to safely complete daily activities?: Yes Is the patient deaf or have difficulty hearing?: No Does the patient have difficulty seeing, even when wearing  glasses/contacts?: No Does the patient have difficulty concentrating, remembering, or making decisions?: No Patient able to express need for assistance with ADLs?: Yes Does the patient have difficulty dressing or bathing?: No Independently performs ADLs?: Yes (appropriate for developmental age) Does the patient have difficulty walking or climbing stairs?: No Weakness of Legs: None Weakness of Arms/Hands: None  Permission Sought/Granted                  Emotional Assessment Appearance:: Appears older than stated age Attitude/Demeanor/Rapport: Engaged Affect (typically observed): Appropriate Orientation: : Oriented to Self, Oriented to Place, Oriented to  Time, Oriented to Situation Alcohol / Substance Use: Alcohol Use, Tobacco Use Psych Involvement: No (comment)  Admission diagnosis:  Acute upper GI bleeding [K92.2] COPD exacerbation ([place]) [J44.1] Gastrointestinal hemorrhage, unspecified gastrointestinal hemorrhage type [W80.3] Alcoholic cirrhosis, unspecified whether ascites present ([place]) [K70.30] Patient Active Problem List   Diagnosis Date Noted  . Alcoholic cirrhosis ([place])   . Acute upper GI bleeding 10/06/2019  . COPD exacerbation ([place]) 10/06/2019  . Symptomatic anemia 04/06/2019  . Malnutrition of moderate degree 09/04/2018  . Sepsis ([place]) 08/26/2018  . Acute on chronic systolic CHF (congestive heart failure) ([place]) 08/17/2018  . Lactic acid acidosis 10/25/2017  . Generalized abdominal pain   . Hematemesis with nausea   . Acute posthemorrhagic anemia   . Gout attack 02/22/2017   PCP:  Rutherford Limerick,  PA Pharmacy:   [id number] [place], [id number]: [phone]: [phone]    Social Determinants of Health (SDOH) Interventions    Readmission Risk Interventions Readmission Risk Prevention Plan 10/08/2019 04/09/2019  Transportation Screening Complete  Complete  PCP or Specialist Appt within 3-5 Days Complete Complete  HRI or Home Care Consult Complete Complete  Social Work Consult for RIsabelPlanning/Counseling Patient refused Patient refused  Palliative Care Screening Not Applicable Not Applicable  Medication Review (RN Care Manager) Complete Referral to Pharmacy  Some recent data might be hidden

## 2019-10-08 NOTE — Progress Notes (Signed)
Arlyss Repress, MD [address]  [address]  [place], [place] [address]  Main: [phone]  Fax: [phone] Pager: [phone]   Subjective: I was asked to see this patient today due to declining hemoglobin.  Patient denies black stools, abdominal pain, nausea or vomiting, rectal bleeding or hematemesis.  His hemoglobin dropped to 6.8 from 7.4 He is tolerating diet well He is concerned about swelling of his bilateral feet   Objective: Vital signs in last 24 hours: Vitals:   10/08/19 1100 10/08/19 1200 10/08/19 1300 10/08/19 1415  BP:      Pulse: 84 85 93   Resp: 18 18 15 16   Temp:      TempSrc:      SpO2: 97% 97% 100% 100%  Weight:      Height:       Weight change: 0.462 kg  Intake/Output Summary (Last 24 hours) at 10/08/2019 1632 Last data filed at 10/08/2019 1300 Gross per 24 hour  Intake 898.8 ml  Output 800 ml  Net 98.8 ml     Exam: Heart:: Regular rate and rhythm, S1S2 present or without murmur or extra heart sounds Lungs: normal and clear to auscultation Abdomen: soft, nontender, normal bowel sounds   Lab Results: CBC Latest Ref Rng & Units 10/08/2019 10/07/2019 10/07/2019  WBC 4.0 - 10.5 K/uL 5.2 - -  Hemoglobin 13.0 - 17.0 g/dL 12/07/2019) 7.4(L) 7.6(L)  Hematocrit 39.0 - 52.0 % 19.7(L) 22.1(L) 22.5(L)  Platelets 150 - 400 K/uL 80(L) - -   CMP Latest Ref Rng & Units 10/08/2019 10/07/2019 10/06/2019  Glucose 70 - 99 mg/dL 85 89 96  BUN 6 - 20 mg/dL 12/06/2019) 44(H) 67(R)  Creatinine 0.61 - 1.24 mg/dL 91(M) 3.84(Y) 6.59(D  Sodium 135 - 145 mmol/L 136 137 138  Potassium 3.5 - 5.1 mmol/L 3.8 3.4(L) 3.2(L)  Chloride 98 - 111 mmol/L 96(L) 95(L) 95(L)  CO2 22 - 32 mmol/L 31 30 23   Calcium 8.9 - 10.3 mg/dL 7.8(L) 7.9(L) 8.2(L)  Total Protein 6.5 - 8.1 g/dL 6.0(L) 6.7 7.2  Total Bilirubin 0.3 - 1.2 mg/dL 3.6(H) 4.2(H) 5.1(H)  Alkaline Phos 38 - 126 U/L 238(H) 275(H) 312(H)  AST 15 - 41 U/L 59(H) 64(H) 89(H)  ALT 0 - 44 U/L 18 22 27     Micro  Results: Recent Results (from the past 240 hour(s))  Blood culture (routine x 2)     Status: None (Preliminary result)   Collection Time: 10/06/19  6:58 PM   Specimen: BLOOD  Result Value Ref Range Status   Specimen Description BLOOD BLOOD LEFT FOREARM  Final   Special Requests   Final    BOTTLES DRAWN AEROBIC AND ANAEROBIC Blood Culture adequate volume   Culture   Final    NO GROWTH 2 DAYS Performed at [facility], [address], [place], [facility] [address]    Report Status PENDING  Incomplete  Blood culture (routine x 2)     Status: None (Preliminary result)   Collection Time: 10/06/19  8:34 PM   Specimen: BLOOD  Result Value Ref Range Status   Specimen Description BLOOD BLOOD RIGHT FOREARM  Final   Special Requests   Final    BOTTLES DRAWN AEROBIC AND ANAEROBIC Blood Culture adequate volume   Culture   Final    NO GROWTH 2 DAYS Performed at [facility], [address] [place], [facility] [address]    Report Status PENDING  Incomplete  Respiratory Panel by RT PCR (Flu  A&B, Covid) - Nasopharyngeal Swab     Status: None   Collection Time: 10/06/19 10:00 PM   Specimen: Nasopharyngeal Swab  Result Value Ref Range Status   SARS Coronavirus 2 by RT PCR NEGATIVE NEGATIVE Final    Comment: (NOTE) SARS-CoV-2 target nucleic acids are NOT DETECTED. The SARS-CoV-2 RNA is generally detectable in upper respiratoy specimens during the acute phase of infection. The lowest concentration of SARS-CoV-2 viral copies this assay can detect is 131 copies/mL. A negative result does not preclude SARS-Cov-2 infection and should not be used as the sole basis for treatment or other patient management decisions. A negative result may occur with  improper specimen collection/handling, submission of specimen other than nasopharyngeal swab, presence of viral mutation(s) within the areas targeted by this assay, and inadequate number of viral copies (<131 copies/mL). A negative result  must be combined with clinical observations, patient history, and epidemiological information. The expected result is Negative. Fact Sheet for Patients:  https://www.moore.com/ Fact Sheet for Healthcare Providers:  https://www.young.biz/ This test is not yet ap proved or cleared by the [place] FDA and  has been authorized for detection and/or diagnosis of SARS-CoV-2 by FDA under an Emergency Use Authorization (EUA). This EUA will remain  in effect (meaning this test can be used) for the duration of the COVID-19 declaration under Section 564(b)(1) of the Act, 21 U.S.C. section 360bbb-3(b)(1), unless the authorization is terminated or revoked sooner.    Influenza A by PCR NEGATIVE NEGATIVE Final   Influenza B by PCR NEGATIVE NEGATIVE Final    Comment: (NOTE) The Xpert Xpress SARS-CoV-2/FLU/RSV assay is intended as an aid in  the diagnosis of influenza from Nasopharyngeal swab specimens and  should not be used as a sole basis for treatment. Nasal washings and  aspirates are unacceptable for Xpert Xpress SARS-CoV-2/FLU/RSV  testing. Fact Sheet for Patients: https://www.moore.com/ Fact Sheet for Healthcare Providers: https://www.young.biz/ This test is not yet approved or cleared by the [place] FDA and  has been authorized for detection and/or diagnosis of SARS-CoV-2 by  FDA under an Emergency Use Authorization (EUA). This EUA will remain  in effect (meaning this test can be used) for the duration of the  Covid-19 declaration under Section 564(b)(1) of the Act, 21  U.S.C. section 360bbb-3(b)(1), unless the authorization is  terminated or revoked. Performed at [facility], [address], [place], [place] [address]    Studies/Results: DG Chest Portable 1 View  Result Date: 10/06/2019 CLINICAL DATA:  Shortness of breath. EXAM: PORTABLE CHEST 1 VIEW COMPARISON:  April 06, 2019  FINDINGS: Mild diffuse chronic appearing increased lung markings are seen without evidence of acute infiltrate, pleural effusion or pneumothorax. The heart size and mediastinal contours are within normal limits. Multiple chronic right rib fractures are seen. IMPRESSION: No active disease. Electronically Signed   By: Aram Candela M.D.   On: 10/06/2019 19:35   Medications:  I have reviewed the patient's current medications. Prior to Admission:  Medications Prior to Admission  Medication Sig Dispense Refill Last Dose  . albuterol (PROVENTIL) (2.5 MG/3ML) 0.083% nebulizer solution Take 2.5 mg by nebulization every 6 (six) hours as needed for wheezing or shortness of breath.   PRN at PRN  . amiodarone (PACERONE) 200 MG tablet Take 1 tablet (200 mg total) by mouth daily. 30 tablet 0   . metoprolol succinate (TOPROL-XL) 25 MG 24 hr tablet Take 25 mg by mouth 2 (two) times daily.   10/06/2019 at Unknown time  . Multiple  Vitamin (MULTIVITAMIN WITH MINERALS) TABS tablet Take 1 tablet by mouth daily. 30 tablet 0    Scheduled: . amiodarone  200 mg Oral Daily  . folic acid  1 mg Oral Daily  . metoprolol succinate  25 mg Oral BID  . multivitamin with minerals  1 tablet Oral Daily  . [START ON 10/10/2019] pantoprazole  40 mg Intravenous Q12H  . polyethylene glycol  17 g Oral Daily  . potassium chloride  40 mEq Oral Once  . sodium chloride flush  3 mL Intravenous Q12H  . vitamin B-12  100 mcg Oral Daily   Continuous: . sodium chloride    . pantoprozole (PROTONIX) infusion 8 mg/hr (10/08/19 1300)   SWF:UXNATF chloride, albuterol, magnesium hydroxide, ondansetron **OR** ondansetron (ZOFRAN) IV, oxyCODONE-acetaminophen, sodium chloride flush, traZODone Anti-infectives (From admission, onward)   Start     Dose/Rate Route Frequency Ordered Stop   10/06/19 2030  vancomycin (VANCOCIN) IVPB 1000 mg/200 mL premix     1,000 mg 200 mL/hr over 60 Minutes Intravenous  Once 10/06/19 2016 10/06/19 2156    10/06/19 1915  cefTRIAXone (ROCEPHIN) 2 g in sodium chloride 0.9 % 100 mL IVPB     2 g 200 mL/hr over 30 Minutes Intravenous  Once 10/06/19 1903 10/06/19 2110     Scheduled Meds: . amiodarone  200 mg Oral Daily  . folic acid  1 mg Oral Daily  . metoprolol succinate  25 mg Oral BID  . multivitamin with minerals  1 tablet Oral Daily  . [START ON 10/10/2019] pantoprazole  40 mg Intravenous Q12H  . polyethylene glycol  17 g Oral Daily  . potassium chloride  40 mEq Oral Once  . sodium chloride flush  3 mL Intravenous Q12H  . vitamin B-12  100 mcg Oral Daily   Continuous Infusions: . sodium chloride    . pantoprozole (PROTONIX) infusion 8 mg/hr (10/08/19 1300)   PRN Meds:.sodium chloride, albuterol, magnesium hydroxide, ondansetron **OR** ondansetron (ZOFRAN) IV, oxyCODONE-acetaminophen, sodium chloride flush, traZODone   Assessment: Active Problems:   Acute upper GI bleeding   COPD exacerbation ([place])  Plan: Acute on chronic anemia, macrocytic secondary to folic acid deficiency Patient had 2 EGDs in the past which revealed portal hypertensive gastropathy, esophageal ulcers Due to drop in hemoglobin, will perform upper endoscopy tomorrow If upper endoscopy is unremarkable, recommend video capsule endoscopy Continue daily folic acid 1 mg Recommend Lasix 40 mg IV x1 due to bilateral swelling of legs from underlying cirrhosis Strict low-sodium diet Blood transfusion as needed to maintain hemoglobin greater than 7 Continue pantoprazole drip   LOS: 2 days   Woodruff Skirvin 10/08/2019, 4:32 PM

## 2019-10-09 ENCOUNTER — Inpatient Hospital Stay: Payer: Medicare Other | Admitting: Anesthesiology

## 2019-10-09 ENCOUNTER — Encounter
Admission: EM | Disposition: A | Discharge status: Home / Self Care | Payer: Self-pay | Source: Home / Self Care | Attending: Internal Medicine

## 2019-10-09 ENCOUNTER — Other Ambulatory Visit: Payer: Self-pay

## 2019-10-09 ENCOUNTER — Encounter: Payer: Self-pay | Admitting: Family Medicine

## 2019-10-09 DIAGNOSIS — K922 Gastrointestinal hemorrhage, unspecified: Secondary | ICD-10-CM | POA: Diagnosis not present

## 2019-10-09 DIAGNOSIS — D500 Iron deficiency anemia secondary to blood loss (chronic): Secondary | ICD-10-CM

## 2019-10-09 HISTORY — PX: ESOPHAGOGASTRODUODENOSCOPY: SHX5428

## 2019-10-09 HISTORY — PX: GIVENS CAPSULE STUDY: SHX5432

## 2019-10-09 LAB — TYPE AND SCREEN
ABO/RH(D): AB POS
Antibody Screen: NEGATIVE
Unit division: 0

## 2019-10-09 LAB — COMPREHENSIVE METABOLIC PANEL
ALT: 20 U/L (ref 0–44)
AST: 66 U/L — ABNORMAL HIGH (ref 15–41)
Albumin: 2.5 g/dL — ABNORMAL LOW (ref 3.5–5.0)
Alkaline Phosphatase: 259 U/L — ABNORMAL HIGH (ref 38–126)
Anion gap: 8 (ref 5–15)
BUN: 23 mg/dL — ABNORMAL HIGH (ref 6–20)
CO2: 31 mmol/L (ref 22–32)
Calcium: 8.3 mg/dL — ABNORMAL LOW (ref 8.9–10.3)
Chloride: 99 mmol/L (ref 98–111)
Creatinine, Ser: 1.47 mg/dL — ABNORMAL HIGH (ref 0.61–1.24)
GFR calc Af Amer: >60 mL/min (ref >60)
GFR calc non Af Amer: 53 mL/min — ABNORMAL LOW (ref >60)
Glucose, Bld: 93 mg/dL (ref 70–99)
Potassium: 4.2 mmol/L (ref 3.5–5.1)
Sodium: 138 mmol/L (ref 135–145)
Total Bilirubin: 3.8 mg/dL — ABNORMAL HIGH (ref 0.3–1.2)
Total Protein: 6.7 g/dL (ref 6.5–8.1)

## 2019-10-09 LAB — CBC
HCT: 24.1 % — ABNORMAL LOW (ref 39.0–52.0)
Hemoglobin: 8.3 g/dL — ABNORMAL LOW (ref 13.0–17.0)
MCH: 36.1 pg — ABNORMAL HIGH (ref 26.0–34.0)
MCHC: 34.4 g/dL (ref 30.0–36.0)
MCV: 104.8 fL — ABNORMAL HIGH (ref 80.0–100.0)
Platelets: 89 10*3/uL — ABNORMAL LOW (ref 150–400)
RBC: 2.3 MIL/uL — ABNORMAL LOW (ref 4.22–5.81)
RDW: 18.6 % — ABNORMAL HIGH (ref 11.5–15.5)
WBC: 6.1 10*3/uL (ref 4.0–10.5)
nRBC: 0 % (ref 0.0–0.2)

## 2019-10-09 LAB — MAGNESIUM: Magnesium: 2.3 mg/dL (ref 1.7–2.4)

## 2019-10-09 LAB — BPAM RBC
Blood Product Expiration Date: 202105082359
ISSUE DATE / TIME: 202104121418
Unit Type and Rh: 6200

## 2019-10-09 SURGERY — EGD (ESOPHAGOGASTRODUODENOSCOPY)
Anesthesia: General

## 2019-10-09 MED ORDER — PROPOFOL 500 MG/50ML IV EMUL
INTRAVENOUS | Status: DC | PRN
  Administered 2019-10-09: 100 ug/kg/min via INTRAVENOUS

## 2019-10-09 MED ORDER — MIDAZOLAM HCL 2 MG/2ML IJ SOLN
INTRAMUSCULAR | Status: DC | PRN
  Administered 2019-10-09: 2 mg via INTRAVENOUS

## 2019-10-09 MED ORDER — FENTANYL CITRATE (PF) 100 MCG/2ML IJ SOLN
INTRAMUSCULAR | Status: DC | PRN
  Administered 2019-10-09: 50 ug via INTRAVENOUS

## 2019-10-09 MED ORDER — PROPOFOL 500 MG/50ML IV EMUL
INTRAVENOUS | Status: AC
  Filled 2019-10-09: qty 50

## 2019-10-09 MED ORDER — MIDAZOLAM HCL 2 MG/2ML IJ SOLN
INTRAMUSCULAR | Status: AC
  Filled 2019-10-09: qty 2

## 2019-10-09 MED ORDER — THIAMINE HCL 100 MG PO TABS
100.0000 mg | ORAL_TABLET | Freq: Every day | ORAL | Status: DC
  Administered 2019-10-09 – 2019-10-10 (×2): 100 mg via ORAL
  Filled 2019-10-09 (×2): qty 1

## 2019-10-09 MED ORDER — LIDOCAINE HCL (PF) 2 % IJ SOLN
INTRAMUSCULAR | Status: AC
  Filled 2019-10-09: qty 5

## 2019-10-09 MED ORDER — FENTANYL CITRATE (PF) 100 MCG/2ML IJ SOLN
INTRAMUSCULAR | Status: AC
  Filled 2019-10-09: qty 2

## 2019-10-09 MED ORDER — SODIUM CHLORIDE 0.9 % IV SOLN: INTRAVENOUS | Status: DC

## 2019-10-09 NOTE — Progress Notes (Addendum)
PROGRESS NOTE    Larry Andrade.  JJH:[id number] DOB: 1960/09/18 DOA: 10/06/2019 PCP: Rutherford Limerick, PA (Confirm with patient/family/NH records and if not entered, this HAS to be entered at [facility] point of entry. "No PCP" if truly none.)   Brief Narrative: (Start on day 1 of progress note - keep it brief and live) Patient is a 59 year old male with history of congestive heart failure, alcoholic liver cirrhosis, hypertension history of pancreatitis who was admitted to the hospital on 10/06/2019 with coffee-ground emesis.  Patient was admitted 6 months ago with the same diagnosis.  At that time, he had EGD, showed portal hypertensive gastropathy, no evidence of esophageal varices.  Patient last drink alcohol was a last Wednesday.  Since admission to the hospital, patient has not had a bowel movement.  He has been able to tolerate a liquid diet.  No nausea or vomiting. He has some chronic leg pain, leg swelling, he is taking some as needed pain medicine. Patient required 1 more units of PRBC on 4/13, repeat EGD 4/14 showed portal hypertension gastropathy, no active bleeding.  Assessment & Plan:   Active Problems:   Acute upper GI bleeding   COPD exacerbation ([place])  #1.  Acute blood loss anemia secondary to GI bleed.  EGD did not show any active bleeding.  Capsule endoscopy is scheduled for today. Hemoglobin has been stable since transfusion.  Repeat a CBC tomorrow.  B12 level is ordered, patient is taking B12 supplement, still has a macrocytic anemia.  #2.  Alcohol abuse with alcoholic liver cirrhosis.   Patient has no symptoms suggestive of alcohol withdrawal.  Continue thiamine and folic acid.  #3.  Acute kidney injury.  Renal function stable but is still high, will give gentle rehydration again.  #4.  Lactic acidosis.   Secondary to chronic liver disease.  No evidence of infection.  #5.  Thrombocytopenia. Secondary to liver cirrhosis.  Follow-up  #6.  Chronic diastolic congestive  heart failure. Stable.  No evidence of volume overload.  I restarted normal saline at 50 mm/h for dehydration with acute kidney injury.  Will monitor closely.  #7.  Paroxysmal atrial fibrillation. Continue amiodarone and metoprolol.  #8.  Essential hypertension.   Continue metoprolol.  #9.  Hypoalbuminemia.   Secondary to liver cirrhosis.  Follow.  #10.  COPD. No evidence of COPD exacerbation.    DVT prophylaxis: SCDS Code Status: Full Family Communication: Discussed with patient, all questions answered.  Disposition Plan:  . Patient came from: Home            . Anticipated d/c place: Home . Barriers to d/c OR conditions which need to be met to effect a safe d/c:   Consultants:   Gastroenterology  Procedures: EGD Antimicrobials: None  Subjective: Patient feels tired today.  He took some stool softener, had 3 bowel movements.  No black stool or rectal bleeding. Denies any short of breath or cough.  Objective: Vitals:   10/09/19 1000 10/09/19 1100 10/09/19 1150 10/09/19 1210  BP:  118/84 98/61 109/73  Pulse: 93 93 92   Resp: 18 20 17    Temp:   (!) 97.3 F (36.3 C)   TempSrc:   Temporal   SpO2: 94% 99%    Weight:      Height:        Intake/Output Summary (Last 24 hours) at 10/09/2019 1438 Last data filed at 10/09/2019 1138 Gross per 24 hour  Intake 517.19 ml  Output 700 ml  Net -182.81 ml  Filed Weights   10/06/19 1857 10/07/19 1458  Weight: 83 kg 83.5 kg    Examination:  General exam: Appears calm and comfortable  Respiratory system: Clear to auscultation. Respiratory effort normal. Cardiovascular system: S1 & S2 heard, RRR. No JVD, murmurs, rubs, gallops or clicks. trace pedal edema. Gastrointestinal system: Abdomen is nondistended, soft and nontender. No organomegaly or masses felt. Normal bowel sounds heard. Central nervous system: Alert and oriented. No focal neurological deficits. Extremities: Symmetric 5 x 5 power. Skin: No rashes, lesions or  ulcers Psychiatry: Judgement and insight appear normal. Mood & affect appropriate.     Data Reviewed: I have personally reviewed following labs and imaging studies  CBC: Recent Labs  Lab 10/06/19 1858 10/06/19 1858 10/07/19 0513 10/07/19 1458 10/07/19 1746 10/08/19 0618 10/09/19 0619  WBC 12.3*  --  7.1  --   --  5.2 6.1  NEUTROABS 9.1*  --   --   --   --   --   --   HGB 8.0*   < > 7.6* 7.6* 7.4* 6.8* 8.3*  HCT 24.4*   < > 22.9* 22.5* 22.1* 19.7* 24.1*  MCV 110.4*  --  108.5*  --   --  106.5* 104.8*  PLT 122*  --  103*  --   --  80* 89*   < > = values in this interval not displayed.   Basic Metabolic Panel: Recent Labs  Lab 10/06/19 1858 10/07/19 0513 10/08/19 0618 10/09/19 0619  NA 138 137 136 138  K 3.2* 3.4* 3.8 4.2  CL 95* 95* 96* 99  CO2 23 30 31 31   GLUCOSE 96 89 85 93  BUN 32* 37* 33* 23*  CREATININE 1.21 1.42* 1.53* 1.47*  CALCIUM 8.2* 7.9* 7.8* 8.3*  MG  --  1.2* 1.7 2.3  PHOS  --   --  2.7  --    GFR: Estimated Creatinine Clearance: 63.4 mL/min (A) (by C-G formula based on SCr of 1.47 mg/dL (H)). Liver Function Tests: Recent Labs  Lab 10/06/19 1858 10/07/19 0513 10/08/19 0618 10/09/19 0619  AST 89* 64* 59* 66*  ALT 27 22 18 20   ALKPHOS 312* 275* 238* 259*  BILITOT 5.1* 4.2* 3.6* 3.8*  PROT 7.2 6.7 6.0* 6.7  ALBUMIN 2.5* 2.5* 2.2* 2.5*   No results for input(s): LIPASE, AMYLASE in the last 168 hours. No results for input(s): AMMONIA in the last 168 hours. Coagulation Profile: Recent Labs  Lab 10/06/19 1858  INR 1.4*   Cardiac Enzymes: No results for input(s): CKTOTAL, CKMB, CKMBINDEX, TROPONINI in the last 168 hours. BNP (last 3 results) No results for input(s): PROBNP in the last 8760 hours. HbA1C: No results for input(s): HGBA1C in the last 72 hours. CBG: No results for input(s): GLUCAP in the last 168 hours. Lipid Profile: No results for input(s): CHOL, HDL, LDLCALC, TRIG, CHOLHDL, LDLDIRECT in the last 72 hours. Thyroid  Function Tests: No results for input(s): TSH, T4TOTAL, FREET4, T3FREE, THYROIDAB in the last 72 hours. Anemia Panel: No results for input(s): VITAMINB12, FOLATE, FERRITIN, TIBC, IRON, RETICCTPCT in the last 72 hours. Sepsis Labs: Recent Labs  Lab 10/06/19 1858 10/06/19 2034 10/07/19 0513  LATICACIDVEN 9.2* 8.1* 3.5*    Recent Results (from the past 240 hour(s))  Blood culture (routine x 2)     Status: None (Preliminary result)   Collection Time: 10/06/19  6:58 PM   Specimen: BLOOD  Result Value Ref Range Status   Specimen Description BLOOD BLOOD LEFT FOREARM  Final  Special Requests   Final    BOTTLES DRAWN AEROBIC AND ANAEROBIC Blood Culture adequate volume   Culture   Final    NO GROWTH 3 DAYS Performed at [facility], [address] [place], [place] [address]    Report Status PENDING  Incomplete  Blood culture (routine x 2)     Status: None (Preliminary result)   Collection Time: 10/06/19  8:34 PM   Specimen: BLOOD  Result Value Ref Range Status   Specimen Description BLOOD BLOOD RIGHT FOREARM  Final   Special Requests   Final    BOTTLES DRAWN AEROBIC AND ANAEROBIC Blood Culture adequate volume   Culture   Final    NO GROWTH 3 DAYS Performed at [facility], [address] [place], [place] [address]    Report Status PENDING  Incomplete  Respiratory Panel by RT PCR (Flu A&B, Covid) - Nasopharyngeal Swab     Status: None   Collection Time: 10/06/19 10:00 PM   Specimen: Nasopharyngeal Swab  Result Value Ref Range Status   SARS Coronavirus 2 by RT PCR NEGATIVE NEGATIVE Final    Comment: (NOTE) SARS-CoV-2 target nucleic acids are NOT DETECTED. The SARS-CoV-2 RNA is generally detectable in upper respiratoy specimens during the acute phase of infection. The lowest concentration of SARS-CoV-2 viral copies this assay can detect is 131 copies/mL. A negative result does not preclude SARS-Cov-2 infection and should not be used as the sole basis for  treatment or other patient management decisions. A negative result may occur with  improper specimen collection/handling, submission of specimen other than nasopharyngeal swab, presence of viral mutation(s) within the areas targeted by this assay, and inadequate number of viral copies (<131 copies/mL). A negative result must be combined with clinical observations, patient history, and epidemiological information. The expected result is Negative. Fact Sheet for Patients:  PinkCheek.be Fact Sheet for Healthcare Providers:  GravelBags.it This test is not yet ap proved or cleared by the [place] FDA and  has been authorized for detection and/or diagnosis of SARS-CoV-2 by FDA under an Emergency Use Authorization (EUA). This EUA will remain  in effect (meaning this test can be used) for the duration of the COVID-19 declaration under Section 564(b)(1) of the Act, 21 U.S.C. section 360bbb-3(b)(1), unless the authorization is terminated or revoked sooner.    Influenza A by PCR NEGATIVE NEGATIVE Final   Influenza B by PCR NEGATIVE NEGATIVE Final    Comment: (NOTE) The Xpert Xpress SARS-CoV-2/FLU/RSV assay is intended as an aid in  the diagnosis of influenza from Nasopharyngeal swab specimens and  should not be used as a sole basis for treatment. Nasal washings and  aspirates are unacceptable for Xpert Xpress SARS-CoV-2/FLU/RSV  testing. Fact Sheet for Patients: PinkCheek.be Fact Sheet for Healthcare Providers: GravelBags.it This test is not yet approved or cleared by the [place] FDA and  has been authorized for detection and/or diagnosis of SARS-CoV-2 by  FDA under an Emergency Use Authorization (EUA). This EUA will remain  in effect (meaning this test can be used) for the duration of the  Covid-19 declaration under Section 564(b)(1) of the Act, 21  U.S.C. section  360bbb-3(b)(1), unless the authorization is  terminated or revoked. Performed at [facility], [address], [place], [place] [address]          Radiology Studies: No results found.      Scheduled Meds: . amiodarone  200 mg Oral Daily  . folic acid  1 mg Oral Daily  .  metoprolol succinate  25 mg Oral BID  . multivitamin with minerals  1 tablet Oral Daily  . [START ON 10/10/2019] pantoprazole  40 mg Intravenous Q12H  . polyethylene glycol  17 g Oral Daily  . potassium chloride  40 mEq Oral Once  . sodium chloride flush  3 mL Intravenous Q12H  . vitamin B-12  100 mcg Oral Daily   Continuous Infusions: . sodium chloride    . pantoprozole (PROTONIX) infusion 8 mg/hr (10/09/19 0451)     LOS: 3 days    Time spent: 26 minutes    Sharen Hones, MD [facility]   To contact the attending provider between 7A-7P or the covering provider during after hours 7P-7A, please log into the web site [email or web address] and access using universal [place] password for that web site. If you do not have the password, please call the hospital operator.  10/09/2019, 2:38 PM

## 2019-10-09 NOTE — Op Note (Signed)
[facility] Patient Name: Larry Andrade Procedure Date: 10/09/2019 11:29 AM MRN: [id number] Account #: [id number] Date of Birth: [birth year] Admit Type: Inpatient Age: 59 Room: [facility] ENDO ROOM 1 Gender: Male Note Status: Finalized Procedure:             Upper GI endoscopy Indications:           Iron deficiency anemia secondary to chronic blood loss Providers:             Lin Landsman MD, MD Referring MD:          No Local Md, MD (Referring MD) Medicines:             Monitored Anesthesia Care Complications:         No immediate complications. Estimated blood loss: None. Procedure:             Pre-Anesthesia Assessment:                        - Prior to the procedure, a History and Physical was                         performed, and patient medications and allergies were                         reviewed. The patient is competent. The risks and                         benefits of the procedure and the sedation options and                         risks were discussed with the patient. All questions                         were answered and informed consent was obtained.                         Patient identification and proposed procedure were                         verified by the physician, the nurse, the                         anesthesiologist, the anesthetist and the technician                         in the pre-procedure area in the procedure room in the                         endoscopy suite. Mental Status Examination: alert and                         oriented. Airway Examination: normal oropharyngeal                         airway and neck mobility. Respiratory Examination:                         clear to auscultation. CV Examination: normal.  Prophylactic Antibiotics: The patient does not require                         prophylactic antibiotics. Prior Anticoagulants: The                         patient has taken no  previous anticoagulant or                         antiplatelet agents. ASA Grade Assessment: IV - A                         patient with severe systemic disease that is a                         constant threat to life. After reviewing the risks and                         benefits, the patient was deemed in satisfactory                         condition to undergo the procedure. The anesthesia                         plan was to use monitored anesthesia care (MAC).                         Immediately prior to administration of medications,                         the patient was re-assessed for adequacy to receive                         sedatives. The heart rate, respiratory rate, oxygen                         saturations, blood pressure, adequacy of pulmonary                         ventilation, and response to care were monitored                         throughout the procedure. The physical status of the                         patient was re-assessed after the procedure.                        After obtaining informed consent, the endoscope was                         passed under direct vision. Throughout the procedure,                         the patient's blood pressure, pulse, and oxygen                         saturations were monitored continuously. The Endoscope  was introduced through the mouth, and advanced to the                         second part of duodenum. The upper GI endoscopy was                         accomplished without difficulty. The patient tolerated                         the procedure well. Findings:      The duodenal bulb and second portion of the duodenum were normal.      A small hiatal hernia was present.      Mild portal hypertensive gastropathy was found in the entire examined       stomach. Biopsies were taken with a cold forceps for Helicobacter pylori       testing.      One superficial esophageal ulcer with no bleeding  and no stigmata of       recent bleeding was found in the lower third of the esophagus. The       lesion was 3 mm in largest dimension. Impression:            - Normal duodenal bulb and second portion of the                         duodenum.                        - Small hiatal hernia.                        - Portal hypertensive gastropathy. Biopsied.                        - Esophageal ulcer with no bleeding and no stigmata of                         recent bleeding. Recommendation:        - Return patient to hospital ward for ongoing care.                        - Full liquid diet today.                        - To visualize the small bowel, perform video capsule                         endoscopy today. Procedure Code(s):     --- Professional ---                        [phone], Esophagogastroduodenoscopy, flexible,                         transoral; with biopsy, single or multiple Diagnosis Code(s):     --- Professional ---                        K44.9, Diaphragmatic hernia without obstruction or  gangrene                        K76.6, Portal hypertension                        K31.89, Other diseases of stomach and duodenum                        K22.10, Ulcer of esophagus without bleeding                        D50.0, Iron deficiency anemia secondary to blood loss                         (chronic) CPT copyright 2019 American Medical Association. All rights reserved. The codes documented in this report are preliminary and upon coder review may  be revised to meet current compliance requirements. Dr. Ulyess Mort Lin Landsman MD, MD 10/09/2019 11:48:37 AM This report has been signed electronically. Number of Addenda: 0 Note Initiated On: 10/09/2019 11:29 AM Estimated Blood Loss:  Estimated blood loss: none.      [facility]

## 2019-10-09 NOTE — Care Management Important Message (Signed)
Important Message  Patient Details  Name: Larry Andrade. MRN: [id number] Date of Birth: [birth year]   Medicare Important Message Given:  Yes     Olegario Messier A Tracie Dore 10/09/2019, 10:14 AM

## 2019-10-09 NOTE — Progress Notes (Signed)
Ch visited with Larry Andrade as part of regular rounding. Larry Andrade's fiance [name] present bedside. Larry Andrade reported that he is going to get procedure done to be able to eat again. Larry Andrade stated " I believe in Jesus." Larry Andrade requested prayer. Ch prayed with Larry Andrade and Larry Andrade's fiance. Larry Andrade said that he would like a visit again anytime and that it gets lonely and boring with just the TV.

## 2019-10-09 NOTE — Anesthesia Preprocedure Evaluation (Signed)
Anesthesia Evaluation  Patient identified by MRN, date of birth, ID band Patient awake    Reviewed: Allergy & Precautions, H&P , NPO status , Patient's Chart, lab work & pertinent test results  History of Anesthesia Complications Negative for: history of anesthetic complications  Airway Mallampati: III  TM Distance: >3 FB Neck ROM: limited    Dental  (+) Chipped, Poor Dentition, Missing   Pulmonary shortness of breath (on saturday), pneumonia, COPD, neg recent URI, Current Smoker and Patient abstained from smoking.,           Cardiovascular hypertension, (-) angina+CHF  (-) Past MI      Neuro/Psych negative neurological ROS  negative psych ROS   GI/Hepatic Neg liver ROS, GERD  Medicated and Controlled,  Endo/Other  negative endocrine ROS  Renal/GU negative Renal ROS  negative genitourinary   Musculoskeletal   Abdominal   Peds  Hematology  (+) Blood dyscrasia, anemia ,   Anesthesia Other Findings Patient is NPO appropriate and reports no nausea or vomiting today.  Past Medical History: No date: CHF (congestive heart failure) (HCC) No date: Cirrhosis (HCC) No date: GERD (gastroesophageal reflux disease) No date: Gout No date: Hypertension No date: Pancreatitis  Past Surgical History: No date: CARDIAC CATHETERIZATION No date: COLON SURGERY 10/26/2017: ESOPHAGOGASTRODUODENOSCOPY (EGD) WITH PROPOFOL; N/A     Comment:  Procedure: ESOPHAGOGASTRODUODENOSCOPY (EGD) WITH               PROPOFOL;  Surgeon: [name], MD;  Location: [facility]               ENDOSCOPY;  Service: Endoscopy;  Laterality: N/A; 09/05/2018: I&D EXTREMITY; Right     Comment:  Procedure: IRRIGATION AND DEBRIDEMENT EXTREMITY;                Surgeon: Juanell Fairly, MD;  Location: [facility] ORS;                Service: Orthopedics;  Laterality: Right; 08/26/2018: KNEE ARTHROSCOPY; Right     Comment:  Procedure: ARTHROSCOPY KNEE;  Surgeon: Juanell Fairly,              MD;  Location: [facility] ORS;  Service: Orthopedics;                Laterality: Right;  BMI    Body Mass Index: 24.14 kg/m      Reproductive/Obstetrics negative OB ROS                             Anesthesia Physical  Anesthesia Plan  ASA: IV  Anesthesia Plan: General   Post-op Pain Management:    Induction: Intravenous  PONV Risk Score and Plan: Propofol infusion and TIVA  Airway Management Planned: Natural Airway and Nasal Cannula  Additional Equipment:   Intra-op Plan:   Post-operative Plan:   Informed Consent: I have reviewed the patients History and Physical, chart, labs and discussed the procedure including the risks, benefits and alternatives for the proposed anesthesia with the patient or authorized representative who has indicated his/her understanding and acceptance.     Dental Advisory Given  Plan Discussed with: Anesthesiologist, CRNA and Surgeon  Anesthesia Plan Comments: (Patient consented for risks of anesthesia including but not limited to:  - adverse reactions to medications - risk of intubation if required - damage to teeth, lips or other oral mucosa - sore throat or hoarseness - Damage to heart, brain, lungs or loss of life  Patient  voiced understanding.)        Anesthesia Quick Evaluation

## 2019-10-09 NOTE — Transfer of Care (Signed)
Immediate Anesthesia Transfer of Care Note  Patient: Larry Andrade.  Procedure(s) Performed: ESOPHAGOGASTRODUODENOSCOPY (EGD) (N/A )  Patient Location: PACU  Anesthesia Type:General  Level of Consciousness: awake and sedated  Airway & Oxygen Therapy: Patient Spontanous Breathing and Patient connected to nasal cannula oxygen  Post-op Assessment: Report given to RN and Post -op Vital signs reviewed and stable  Post vital signs: Reviewed and stable  Last Vitals:  Vitals Value Taken Time  BP    Temp    Pulse 92 10/09/19 1151  Resp 17 10/09/19 1151  SpO2 90 % 10/09/19 1151  Vitals shown include unvalidated device data.  Last Pain:  Vitals:   10/09/19 1100  TempSrc:   PainSc: 0-No pain      Patients Stated Pain Goal: 0 (10/08/19 0851)  Complications: No apparent anesthesia complications

## 2019-10-09 NOTE — Anesthesia Postprocedure Evaluation (Signed)
Anesthesia Post Note  Patient: Larry Andrade.  Procedure(s) Performed: ESOPHAGOGASTRODUODENOSCOPY (EGD) (N/A )  Patient location during evaluation: Endoscopy Anesthesia Type: General Level of consciousness: awake and alert Pain management: pain level controlled Vital Signs Assessment: post-procedure vital signs reviewed and stable Respiratory status: spontaneous breathing, nonlabored ventilation, respiratory function stable and patient connected to nasal cannula oxygen Cardiovascular status: blood pressure returned to baseline and stable Postop Assessment: no apparent nausea or vomiting Anesthetic complications: no     Last Vitals:  Vitals:   10/09/19 1150 10/09/19 1210  BP: 98/61 109/73  Pulse: 92   Resp: 17   Temp: (!) 36.3 C   SpO2:      Last Pain:  Vitals:   10/09/19 1230  TempSrc:   PainSc: 0-No pain                 Lenard Simmer

## 2019-10-10 ENCOUNTER — Encounter: Payer: Self-pay | Admitting: *Deleted

## 2019-10-10 DIAGNOSIS — D696 Thrombocytopenia, unspecified: Secondary | ICD-10-CM

## 2019-10-10 DIAGNOSIS — N1831 Chronic kidney disease, stage 3a: Secondary | ICD-10-CM

## 2019-10-10 DIAGNOSIS — I10 Essential (primary) hypertension: Secondary | ICD-10-CM

## 2019-10-10 DIAGNOSIS — I5032 Chronic diastolic (congestive) heart failure: Secondary | ICD-10-CM

## 2019-10-10 DIAGNOSIS — D62 Acute posthemorrhagic anemia: Secondary | ICD-10-CM | POA: Diagnosis not present

## 2019-10-10 DIAGNOSIS — F1010 Alcohol abuse, uncomplicated: Secondary | ICD-10-CM | POA: Diagnosis not present

## 2019-10-10 DIAGNOSIS — I480 Paroxysmal atrial fibrillation: Secondary | ICD-10-CM

## 2019-10-10 LAB — BASIC METABOLIC PANEL
Anion gap: 8 (ref 5–15)
BUN: 14 mg/dL (ref 6–20)
CO2: 28 mmol/L (ref 22–32)
Calcium: 8.1 mg/dL — ABNORMAL LOW (ref 8.9–10.3)
Chloride: 99 mmol/L (ref 98–111)
Creatinine, Ser: 1.45 mg/dL — ABNORMAL HIGH (ref 0.61–1.24)
GFR calc Af Amer: >60 mL/min (ref >60)
GFR calc non Af Amer: 53 mL/min — ABNORMAL LOW (ref >60)
Glucose, Bld: 91 mg/dL (ref 70–99)
Potassium: 4.1 mmol/L (ref 3.5–5.1)
Sodium: 135 mmol/L (ref 135–145)

## 2019-10-10 LAB — CBC
HCT: 23.5 % — ABNORMAL LOW (ref 39.0–52.0)
Hemoglobin: 8 g/dL — ABNORMAL LOW (ref 13.0–17.0)
MCH: 36.2 pg — ABNORMAL HIGH (ref 26.0–34.0)
MCHC: 34 g/dL (ref 30.0–36.0)
MCV: 106.3 fL — ABNORMAL HIGH (ref 80.0–100.0)
Platelets: 85 10*3/uL — ABNORMAL LOW (ref 150–400)
RBC: 2.21 MIL/uL — ABNORMAL LOW (ref 4.22–5.81)
RDW: 18.3 % — ABNORMAL HIGH (ref 11.5–15.5)
WBC: 5.5 10*3/uL (ref 4.0–10.5)
nRBC: 0 % (ref 0.0–0.2)

## 2019-10-10 LAB — MAGNESIUM: Magnesium: 1.9 mg/dL (ref 1.7–2.4)

## 2019-10-10 LAB — SURGICAL PATHOLOGY

## 2019-10-10 LAB — VITAMIN B12: Vitamin B-12: 839 pg/mL (ref 180–914)

## 2019-10-10 MED ORDER — THIAMINE HCL 100 MG PO TABS: 100.0000 mg | ORAL_TABLET | Freq: Every day | ORAL | 0 refills | Status: AC

## 2019-10-10 MED ORDER — FOLIC ACID 1 MG PO TABS: 1.0000 mg | ORAL_TABLET | Freq: Every day | ORAL | 0 refills | Status: DC

## 2019-10-10 MED ORDER — VITAMIN B 12 500 MCG PO TABS: 500.0000 ug | ORAL_TABLET | Freq: Every day | ORAL | 0 refills | Status: DC

## 2019-10-10 MED ORDER — SODIUM CHLORIDE 0.9 % IV SOLN
300.0000 mg | Freq: Once | INTRAVENOUS | Status: AC
  Administered 2019-10-10: 300 mg via INTRAVENOUS
  Filled 2019-10-10: qty 15

## 2019-10-10 MED ORDER — PANTOPRAZOLE SODIUM 40 MG PO TBEC: 40.0000 mg | DELAYED_RELEASE_TABLET | Freq: Every day | ORAL | 11 refills | Status: AC

## 2019-10-10 NOTE — Progress Notes (Signed)
Patient discharged home via POV. 

## 2019-10-10 NOTE — Discharge Summary (Signed)
[facility] [place] [facility]   PATIENT NAME: Larry Andrade    MR#:  [id number]  DATE OF BIRTH:  1960-09-24  DATE OF ADMISSION:  10/06/2019 ADMITTING PHYSICIAN: Hannah Beat, MD  DATE OF DISCHARGE: 10/10/2019  PRIMARY CARE PHYSICIAN: Gildardo Pounds, PA    ADMISSION DIAGNOSIS:  Acute upper GI bleeding [K92.2] COPD exacerbation (HCC) [J44.1] Gastrointestinal hemorrhage, unspecified gastrointestinal hemorrhage type [K92.2] Alcoholic cirrhosis, unspecified whether ascites present (HCC) [K70.30]  DISCHARGE DIAGNOSIS:  Active Problems:   Acute upper GI bleeding   COPD exacerbation (HCC)   SECONDARY DIAGNOSIS:   Past Medical History:  Diagnosis Date  . CHF (congestive heart failure) (HCC)   . Cirrhosis (HCC)   . GERD (gastroesophageal reflux disease)   . Gout   . Hypertension   . Pancreatitis     HOSPITAL COURSE:   1.  Acute blood loss anemia secondary to GI bleed.  Endoscopy showed hypertensive gastropathy but no active bleeding.  Capsule endoscopy was negative as per GI.  Hemoglobin stable since transfusion.  I did give IV iron today.  Patient as per GI stable to go home today.  Follow-up as outpatient for blood count check. 2.  Alcohol abuse with cirrhosis and thrombocytopenia.  Advised no drinking alcohol.  Continue thiamine, folate.  Follow-up platelet count as outpatient. 3.  Chronic kidney disease stage IIIa 4.  Essential hypertension on metoprolol 5.  Paroxysmal atrial fibrillation on amiodarone and Toprol-XL.  No anticoagulation with acute blood loss anemia. 6.  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 7.  Chronic diastolic congestive heart failure.  I discontinued IV fluids.  No signs of congestive heart failure. 8.  Back in October 2020 the patient did have a CT scan that showed a pseudocyst.  Would recommend a follow-up CT scan or MRI for evaluation which can be done as outpatient.  This should be done to make sure that this is just  a pseudocyst that is resolving or making sure nothing more ominous.  DISCHARGE CONDITIONS:   Satisfactory  CONSULTS OBTAINED:  Treatment Team:  Midge Minium, MD Toney Reil, MD  DRUG ALLERGIES:   Allergies  Allergen Reactions  . Chlorhexidine     No  midline present  . Penicillins Rash    Has patient had a PCN reaction causing immediate rash, facial/tongue/throat swelling, SOB or lightheadedness with hypotension: No Has patient had a PCN reaction causing severe rash involving mucus membranes or skin necrosis: No Has patient had a PCN reaction that required hospitalization: No Has patient had a PCN reaction occurring within the last 10 years: No If all of the above answers are "NO", then may proceed with Cephalosporin use.     DISCHARGE MEDICATIONS:   Allergies as of 10/10/2019      Reactions   Chlorhexidine    No  midline present   Penicillins Rash   Has patient had a PCN reaction causing immediate rash, facial/tongue/throat swelling, SOB or lightheadedness with hypotension: No Has patient had a PCN reaction causing severe rash involving mucus membranes or skin necrosis: No Has patient had a PCN reaction that required hospitalization: No Has patient had a PCN reaction occurring within the last 10 years: No If all of the above answers are "NO", then may proceed with Cephalosporin use.      Medication List    TAKE these medications   albuterol (2.5 MG/3ML) 0.083% nebulizer solution Commonly known as: PROVENTIL Take 2.5 mg by nebulization every 6 (six) hours as  needed for wheezing or shortness of breath.   amiodarone 200 MG tablet Commonly known as: PACERONE Take 1 tablet (200 mg total) by mouth daily.   folic acid 1 MG tablet Commonly known as: FOLVITE Take 1 tablet (1 mg total) by mouth daily. Start taking on: October 11, 2019   metoprolol succinate 25 MG 24 hr tablet Commonly known as: TOPROL-XL Take 25 mg by mouth 2 (two) times daily.   multivitamin  with minerals Tabs tablet Take 1 tablet by mouth daily.   pantoprazole 40 MG tablet Commonly known as: Protonix Take 1 tablet (40 mg total) by mouth daily.   thiamine 100 MG tablet Take 1 tablet (100 mg total) by mouth daily. Start taking on: October 11, 2019   Vitamin B 12 500 MCG Tabs Take 500 mcg by mouth daily. Start taking on: October 11, 2019        DISCHARGE INSTRUCTIONS:   Follow-up PMD 5 days Follow-up with GI if needed in 3 weeks  If you experience worsening of your admission symptoms, develop shortness of breath, life threatening emergency, suicidal or homicidal thoughts you must seek medical attention immediately by calling 911 or calling your MD immediately  if symptoms less severe.  You Must read complete instructions/literature along with all the possible adverse reactions/side effects for all the Medicines you take and that have been prescribed to you. Take any new Medicines after you have completely understood and accept all the possible adverse reactions/side effects.   Please note  You were cared for by a hospitalist during your hospital stay. If you have any questions about your discharge medications or the care you received while you were in the hospital after you are discharged, you can call the unit and asked to speak with the hospitalist on call if the hospitalist that took care of you is not available. Once you are discharged, your primary care physician will handle any further medical issues. Please note that NO REFILLS for any discharge medications will be authorized once you are discharged, as it is imperative that you return to your primary care physician (or establish a relationship with a primary care physician if you do not have one) for your aftercare needs so that they can reassess your need for medications and monitor your lab values.    Today   CHIEF COMPLAINT:   Chief Complaint  Patient presents with  . Shortness of Breath    HISTORY OF  PRESENT ILLNESS:  Larry Andrade  is a 59 y.o. male came in with shortness of breath   VITAL SIGNS:  Blood pressure 127/85, pulse 94, temperature 98.2 F (36.8 C), temperature source Oral, resp. rate 14, height 6\' 1"  (1.854 m), weight 83.5 kg, SpO2 100 %.   PHYSICAL EXAMINATION:  GENERAL:  59 y.o.-year-old patient lying in the bed with no acute distress.  EYES: Pupils equal, round, reactive to light and accommodation. No scleral icterus. Extraocular muscles intact.  HEENT: Head atraumatic, normocephalic. Oropharynx and nasopharynx clear.  NECK:  Supple, no jugular venous distention. No thyroid enlargement, no tenderness.  LUNGS: Normal breath sounds bilaterally, no wheezing, rales,rhonchi or crepitation. No use of accessory muscles of respiration.  CARDIOVASCULAR: S1, S2 normal. No murmurs, rubs, or gallops.  ABDOMEN: Soft, non-tender, non-distended. Bowel sounds present. No organomegaly or mass.  EXTREMITIES: No pedal edema, cyanosis, or clubbing.  NEUROLOGIC: Cranial nerves II through XII are intact. Muscle strength 5/5 in all extremities. Sensation intact. Gait not checked.  PSYCHIATRIC: The patient is alert  and oriented x 3.  SKIN: No obvious rash, lesion, or ulcer.   DATA REVIEW:   CBC Recent Labs  Lab 10/10/19 0558  WBC 5.5  HGB 8.0*  HCT 23.5*  PLT 85*    Chemistries  Recent Labs  Lab 10/09/19 0619 10/09/19 0619 10/10/19 0558  NA 138   < > 135  K 4.2   < > 4.1  CL 99   < > 99  CO2 31   < > 28  GLUCOSE 93   < > 91  BUN 23*   < > 14  CREATININE 1.47*   < > 1.45*  CALCIUM 8.3*   < > 8.1*  MG 2.3   < > 1.9  AST 66*  --   --   ALT 20  --   --   ALKPHOS 259*  --   --   BILITOT 3.8*  --   --    < > = values in this interval not displayed.     Microbiology Results  Results for orders placed or performed during the hospital encounter of 10/06/19  Blood culture (routine x 2)     Status: None (Preliminary result)   Collection Time: 10/06/19  6:58 PM   Specimen:  BLOOD  Result Value Ref Range Status   Specimen Description BLOOD BLOOD LEFT FOREARM  Final   Special Requests   Final    BOTTLES DRAWN AEROBIC AND ANAEROBIC Blood Culture adequate volume   Culture   Final    NO GROWTH 4 DAYS Performed at [facility], [address], [place], [place] [address]    Report Status PENDING  Incomplete  Blood culture (routine x 2)     Status: None (Preliminary result)   Collection Time: 10/06/19  8:34 PM   Specimen: BLOOD  Result Value Ref Range Status   Specimen Description BLOOD BLOOD RIGHT FOREARM  Final   Special Requests   Final    BOTTLES DRAWN AEROBIC AND ANAEROBIC Blood Culture adequate volume   Culture   Final    NO GROWTH 4 DAYS Performed at [facility], [address] [place], [place] [address]    Report Status PENDING  Incomplete  Respiratory Panel by RT PCR (Flu A&B, Covid) - Nasopharyngeal Swab     Status: None   Collection Time: 10/06/19 10:00 PM   Specimen: Nasopharyngeal Swab  Result Value Ref Range Status   SARS Coronavirus 2 by RT PCR NEGATIVE NEGATIVE Final    Comment: (NOTE) SARS-CoV-2 target nucleic acids are NOT DETECTED. The SARS-CoV-2 RNA is generally detectable in upper respiratoy specimens during the acute phase of infection. The lowest concentration of SARS-CoV-2 viral copies this assay can detect is 131 copies/mL. A negative result does not preclude SARS-Cov-2 infection and should not be used as the sole basis for treatment or other patient management decisions. A negative result may occur with  improper specimen collection/handling, submission of specimen other than nasopharyngeal swab, presence of viral mutation(s) within the areas targeted by this assay, and inadequate number of viral copies (<131 copies/mL). A negative result must be combined with clinical observations, patient history, and epidemiological information. The expected result is Negative. Fact Sheet for Patients:   PinkCheek.be Fact Sheet for Healthcare Providers:  GravelBags.it This test is not yet ap proved or cleared by the [place] FDA and  has been authorized for detection and/or diagnosis of SARS-CoV-2 by FDA under an Emergency Use Authorization (EUA). This EUA will remain  in  effect (meaning this test can be used) for the duration of the COVID-19 declaration under Section 564(b)(1) of the Act, 21 U.S.C. section 360bbb-3(b)(1), unless the authorization is terminated or revoked sooner.    Influenza A by PCR NEGATIVE NEGATIVE Final   Influenza B by PCR NEGATIVE NEGATIVE Final    Comment: (NOTE) The Xpert Xpress SARS-CoV-2/FLU/RSV assay is intended as an aid in  the diagnosis of influenza from Nasopharyngeal swab specimens and  should not be used as a sole basis for treatment. Nasal washings and  aspirates are unacceptable for Xpert Xpress SARS-CoV-2/FLU/RSV  testing. Fact Sheet for Patients: https://www.moore.com/ Fact Sheet for Healthcare Providers: https://www.young.biz/ This test is not yet approved or cleared by the [place] FDA and  has been authorized for detection and/or diagnosis of SARS-CoV-2 by  FDA under an Emergency Use Authorization (EUA). This EUA will remain  in effect (meaning this test can be used) for the duration of the  Covid-19 declaration under Section 564(b)(1) of the Act, 21  U.S.C. section 360bbb-3(b)(1), unless the authorization is  terminated or revoked. Performed at [facility], [address] [place], [place] [address]       Management plans discussed with the patient, and he is in agreement.  CODE STATUS:     Code Status Orders  (From admission, onward)         Start     Ordered   10/06/19 2010  Full code  Continuous     10/06/19 2015        Code Status History    Date Active Date Inactive Code Status Order ID Comments User  Context   04/06/2019 1652 04/09/2019 1603 Full Code [id number]  Auburn Bilberry, MD ED   08/26/2018 1724 09/08/2018 2203 Full Code [id number]  Adrian Saran, MD ED   08/17/2018 0430 08/21/2018 1807 Full Code [id number]  Arnaldo Natal, MD ED   10/25/2017 0147 10/26/2017 1717 Full Code [id number]  Cammy Copa, MD Inpatient   02/22/2017 1122 02/24/2017 1413 Full Code [id number]  Alford Highland, MD ED   Advance Care Planning Activity    Advance Directive Documentation     Most Recent Value  Type of Advance Directive  Healthcare Power of Attorney, Living will  Pre-existing out of facility DNR order (yellow form or pink MOST form)  --  "MOST" Form in Place?  --      TOTAL TIME TAKING CARE OF THIS PATIENT: 35 minutes.    Alford Highland M.D on 10/10/2019 at 6:03 PM  Between 7am to 6pm - Pager - [phone]  After 6pm go to [email or web address] - password [email or web address]  [facility]  CC: Primary care physician; Gildardo Pounds, PA

## 2019-10-10 NOTE — Plan of Care (Signed)

## 2019-10-11 LAB — CULTURE, BLOOD (ROUTINE X 2)
Culture: NO GROWTH
Culture: NO GROWTH
Special Requests: ADEQUATE
Special Requests: ADEQUATE

## 2019-11-18 ENCOUNTER — Other Ambulatory Visit: Payer: Self-pay

## 2019-11-18 ENCOUNTER — Inpatient Hospital Stay
Admission: EM | Admit: 2019-11-18 | Discharge: 2019-11-22 | DRG: 441 | Disposition: A | Discharge status: Home Health | Payer: Medicare Other | Attending: Internal Medicine | Admitting: Internal Medicine

## 2019-11-18 ENCOUNTER — Encounter: Payer: Self-pay | Admitting: Emergency Medicine

## 2019-11-18 DIAGNOSIS — R791 Abnormal coagulation profile: Secondary | ICD-10-CM | POA: Diagnosis present

## 2019-11-18 DIAGNOSIS — K2101 Gastro-esophageal reflux disease with esophagitis, with bleeding: Secondary | ICD-10-CM | POA: Diagnosis present

## 2019-11-18 DIAGNOSIS — K766 Portal hypertension: Secondary | ICD-10-CM | POA: Diagnosis present

## 2019-11-18 DIAGNOSIS — N1831 Chronic kidney disease, stage 3a: Secondary | ICD-10-CM | POA: Diagnosis present

## 2019-11-18 DIAGNOSIS — Z7901 Long term (current) use of anticoagulants: Secondary | ICD-10-CM | POA: Diagnosis not present

## 2019-11-18 DIAGNOSIS — I5032 Chronic diastolic (congestive) heart failure: Secondary | ICD-10-CM | POA: Diagnosis present

## 2019-11-18 DIAGNOSIS — F1010 Alcohol abuse, uncomplicated: Secondary | ICD-10-CM | POA: Diagnosis not present

## 2019-11-18 DIAGNOSIS — Z720 Tobacco use: Secondary | ICD-10-CM | POA: Diagnosis not present

## 2019-11-18 DIAGNOSIS — Z20822 Contact with and (suspected) exposure to covid-19: Secondary | ICD-10-CM | POA: Diagnosis present

## 2019-11-18 DIAGNOSIS — K3189 Other diseases of stomach and duodenum: Secondary | ICD-10-CM | POA: Diagnosis present

## 2019-11-18 DIAGNOSIS — D6959 Other secondary thrombocytopenia: Secondary | ICD-10-CM | POA: Diagnosis present

## 2019-11-18 DIAGNOSIS — F17210 Nicotine dependence, cigarettes, uncomplicated: Secondary | ICD-10-CM | POA: Diagnosis present

## 2019-11-18 DIAGNOSIS — D539 Nutritional anemia, unspecified: Secondary | ICD-10-CM | POA: Diagnosis present

## 2019-11-18 DIAGNOSIS — J449 Chronic obstructive pulmonary disease, unspecified: Secondary | ICD-10-CM | POA: Diagnosis present

## 2019-11-18 DIAGNOSIS — D696 Thrombocytopenia, unspecified: Secondary | ICD-10-CM | POA: Diagnosis present

## 2019-11-18 DIAGNOSIS — I8510 Secondary esophageal varices without bleeding: Secondary | ICD-10-CM | POA: Diagnosis present

## 2019-11-18 DIAGNOSIS — Z79899 Other long term (current) drug therapy: Secondary | ICD-10-CM

## 2019-11-18 DIAGNOSIS — M109 Gout, unspecified: Secondary | ICD-10-CM | POA: Diagnosis present

## 2019-11-18 DIAGNOSIS — I480 Paroxysmal atrial fibrillation: Secondary | ICD-10-CM | POA: Diagnosis not present

## 2019-11-18 DIAGNOSIS — K7030 Alcoholic cirrhosis of liver without ascites: Secondary | ICD-10-CM | POA: Diagnosis present

## 2019-11-18 DIAGNOSIS — D62 Acute posthemorrhagic anemia: Secondary | ICD-10-CM | POA: Diagnosis present

## 2019-11-18 DIAGNOSIS — I130 Hypertensive heart and chronic kidney disease with heart failure and stage 1 through stage 4 chronic kidney disease, or unspecified chronic kidney disease: Secondary | ICD-10-CM | POA: Diagnosis present

## 2019-11-18 DIAGNOSIS — I10 Essential (primary) hypertension: Secondary | ICD-10-CM | POA: Diagnosis not present

## 2019-11-18 DIAGNOSIS — K7010 Alcoholic hepatitis without ascites: Secondary | ICD-10-CM | POA: Diagnosis not present

## 2019-11-18 DIAGNOSIS — K922 Gastrointestinal hemorrhage, unspecified: Secondary | ICD-10-CM | POA: Diagnosis present

## 2019-11-18 DIAGNOSIS — K920 Hematemesis: Secondary | ICD-10-CM | POA: Diagnosis present

## 2019-11-18 DIAGNOSIS — K219 Gastro-esophageal reflux disease without esophagitis: Secondary | ICD-10-CM | POA: Diagnosis present

## 2019-11-18 LAB — COMPREHENSIVE METABOLIC PANEL
ALT: 43 U/L (ref 0–44)
ALT: 39 U/L (ref 0–44)
AST: 158 U/L — ABNORMAL HIGH (ref 15–41)
AST: 143 U/L — ABNORMAL HIGH (ref 15–41)
Albumin: 2.5 g/dL — ABNORMAL LOW (ref 3.5–5.0)
Albumin: 2.2 g/dL — ABNORMAL LOW (ref 3.5–5.0)
Alkaline Phosphatase: 256 U/L — ABNORMAL HIGH (ref 38–126)
Alkaline Phosphatase: 197 U/L — ABNORMAL HIGH (ref 38–126)
Anion gap: 24 — ABNORMAL HIGH (ref 5–15)
Anion gap: 18 — ABNORMAL HIGH (ref 5–15)
BUN: 23 mg/dL — ABNORMAL HIGH (ref 6–20)
BUN: 28 mg/dL — ABNORMAL HIGH (ref 6–20)
CO2: 25 mmol/L (ref 22–32)
CO2: 27 mmol/L (ref 22–32)
Calcium: 8.2 mg/dL — ABNORMAL LOW (ref 8.9–10.3)
Calcium: 7.5 mg/dL — ABNORMAL LOW (ref 8.9–10.3)
Chloride: 90 mmol/L — ABNORMAL LOW (ref 98–111)
Chloride: 95 mmol/L — ABNORMAL LOW (ref 98–111)
Creatinine, Ser: 1.28 mg/dL — ABNORMAL HIGH (ref 0.61–1.24)
Creatinine, Ser: 1.24 mg/dL (ref 0.61–1.24)
GFR calc Af Amer: >60 mL/min (ref >60)
GFR calc Af Amer: >60 mL/min (ref >60)
GFR calc non Af Amer: >60 mL/min (ref >60)
GFR calc non Af Amer: >60 mL/min (ref >60)
Glucose, Bld: 117 mg/dL — ABNORMAL HIGH (ref 70–99)
Glucose, Bld: 111 mg/dL — ABNORMAL HIGH (ref 70–99)
Potassium: 3.6 mmol/L (ref 3.5–5.1)
Potassium: 4 mmol/L (ref 3.5–5.1)
Sodium: 139 mmol/L (ref 135–145)
Sodium: 140 mmol/L (ref 135–145)
Total Bilirubin: 14.3 mg/dL — ABNORMAL HIGH (ref 0.3–1.2)
Total Bilirubin: 13.1 mg/dL — ABNORMAL HIGH (ref 0.3–1.2)
Total Protein: 7.9 g/dL (ref 6.5–8.1)
Total Protein: 6.7 g/dL (ref 6.5–8.1)

## 2019-11-18 LAB — CBC
HCT: 24.5 % — ABNORMAL LOW (ref 39.0–52.0)
HCT: 19.5 % — ABNORMAL LOW (ref 39.0–52.0)
HCT: 21.1 % — ABNORMAL LOW (ref 39.0–52.0)
Hemoglobin: 8.3 g/dL — ABNORMAL LOW (ref 13.0–17.0)
Hemoglobin: 6.6 g/dL — ABNORMAL LOW (ref 13.0–17.0)
Hemoglobin: 7.4 g/dL — ABNORMAL LOW (ref 13.0–17.0)
MCH: 35.6 pg — ABNORMAL HIGH (ref 26.0–34.0)
MCH: 36.1 pg — ABNORMAL HIGH (ref 26.0–34.0)
MCH: 35.1 pg — ABNORMAL HIGH (ref 26.0–34.0)
MCHC: 33.9 g/dL (ref 30.0–36.0)
MCHC: 33.8 g/dL (ref 30.0–36.0)
MCHC: 35.1 g/dL (ref 30.0–36.0)
MCV: 105.2 fL — ABNORMAL HIGH (ref 80.0–100.0)
MCV: 106.6 fL — ABNORMAL HIGH (ref 80.0–100.0)
MCV: 100 fL (ref 80.0–100.0)
Platelets: 112 10*3/uL — ABNORMAL LOW (ref 150–400)
Platelets: 93 10*3/uL — ABNORMAL LOW (ref 150–400)
Platelets: 88 10*3/uL — ABNORMAL LOW (ref 150–400)
RBC: 2.33 MIL/uL — ABNORMAL LOW (ref 4.22–5.81)
RBC: 1.83 MIL/uL — ABNORMAL LOW (ref 4.22–5.81)
RBC: 2.11 MIL/uL — ABNORMAL LOW (ref 4.22–5.81)
RDW: 19.5 % — ABNORMAL HIGH (ref 11.5–15.5)
RDW: 19.9 % — ABNORMAL HIGH (ref 11.5–15.5)
RDW: 20.5 % — ABNORMAL HIGH (ref 11.5–15.5)
WBC: 10.9 10*3/uL — ABNORMAL HIGH (ref 4.0–10.5)
WBC: 10.1 10*3/uL (ref 4.0–10.5)
WBC: 9.6 10*3/uL (ref 4.0–10.5)
nRBC: 0 % (ref 0.0–0.2)
nRBC: 0 % (ref 0.0–0.2)
nRBC: 0 % (ref 0.0–0.2)

## 2019-11-18 LAB — PROTIME-INR
INR: 1.6 — ABNORMAL HIGH (ref 0.8–1.2)
Prothrombin Time: 18.4 seconds — ABNORMAL HIGH (ref 11.4–15.2)

## 2019-11-18 LAB — MRSA PCR SCREENING: MRSA by PCR: POSITIVE — AB

## 2019-11-18 LAB — APTT: aPTT: 41 seconds — ABNORMAL HIGH (ref 24–36)

## 2019-11-18 LAB — SARS CORONAVIRUS 2 BY RT PCR (HOSPITAL ORDER, PERFORMED IN ~~LOC~~ HOSPITAL LAB): SARS Coronavirus 2: NEGATIVE

## 2019-11-18 LAB — BRAIN NATRIURETIC PEPTIDE: B Natriuretic Peptide: 125.1 pg/mL — ABNORMAL HIGH (ref 0.0–100.0)

## 2019-11-18 LAB — AMMONIA: Ammonia: 85 umol/L — ABNORMAL HIGH (ref 9–35)

## 2019-11-18 MED ORDER — METOPROLOL SUCCINATE ER 25 MG PO TB24
25.0000 mg | ORAL_TABLET | Freq: Two times a day (BID) | ORAL | Status: DC
  Administered 2019-11-18 – 2019-11-22 (×8): 25 mg via ORAL
  Filled 2019-11-18 (×8): qty 1

## 2019-11-18 MED ORDER — PROMETHAZINE HCL 25 MG/ML IJ SOLN
25.0000 mg | Freq: Once | INTRAMUSCULAR | Status: AC
  Administered 2019-11-18: 25 mg via INTRAVENOUS
  Filled 2019-11-18: qty 1

## 2019-11-18 MED ORDER — VITAMIN K1 10 MG/ML IJ SOLN
10.0000 mg | Freq: Once | INTRAVENOUS | Status: AC
  Administered 2019-11-18: 10 mg via INTRAVENOUS
  Filled 2019-11-18: qty 1

## 2019-11-18 MED ORDER — SODIUM CHLORIDE 0.9 % IV SOLN
50.0000 ug/h | INTRAVENOUS | Status: DC
  Administered 2019-11-18 – 2019-11-19 (×3): 50 ug/h via INTRAVENOUS
  Filled 2019-11-18 (×5): qty 1

## 2019-11-18 MED ORDER — COLCHICINE 0.6 MG PO TABS
0.6000 mg | ORAL_TABLET | ORAL | Status: DC | PRN
  Filled 2019-11-18: qty 1

## 2019-11-18 MED ORDER — LORAZEPAM 2 MG/ML IJ SOLN
1.0000 mg | INTRAMUSCULAR | Status: AC | PRN
  Administered 2019-11-18: 1 mg via INTRAVENOUS
  Filled 2019-11-18: qty 1

## 2019-11-18 MED ORDER — ALLOPURINOL 100 MG PO TABS
100.0000 mg | ORAL_TABLET | Freq: Two times a day (BID) | ORAL | Status: DC
  Administered 2019-11-18 – 2019-11-22 (×8): 100 mg via ORAL
  Filled 2019-11-18 (×9): qty 1

## 2019-11-18 MED ORDER — SODIUM CHLORIDE 0.9 % IV SOLN
8.0000 mg/h | INTRAVENOUS | Status: AC
  Administered 2019-11-18 – 2019-11-21 (×6): 8 mg/h via INTRAVENOUS
  Filled 2019-11-18 (×7): qty 80

## 2019-11-18 MED ORDER — SODIUM CHLORIDE 0.9 % IV SOLN: INTRAVENOUS | Status: DC

## 2019-11-18 MED ORDER — MUPIROCIN 2 % EX OINT
1.0000 "application " | TOPICAL_OINTMENT | Freq: Two times a day (BID) | CUTANEOUS | Status: DC
  Administered 2019-11-18 – 2019-11-22 (×8): 1 via NASAL
  Filled 2019-11-18: qty 22

## 2019-11-18 MED ORDER — ALBUTEROL SULFATE (2.5 MG/3ML) 0.083% IN NEBU
3.0000 mL | INHALATION_SOLUTION | RESPIRATORY_TRACT | Status: DC | PRN
  Administered 2019-11-21 (×2): 3 mL via RESPIRATORY_TRACT
  Filled 2019-11-18 (×2): qty 3

## 2019-11-18 MED ORDER — CYANOCOBALAMIN 500 MCG PO TABS
500.0000 ug | ORAL_TABLET | Freq: Every day | ORAL | Status: DC
  Administered 2019-11-19 – 2019-11-22 (×4): 500 ug via ORAL
  Filled 2019-11-18 (×5): qty 1

## 2019-11-18 MED ORDER — ONDANSETRON HCL 4 MG/2ML IJ SOLN
4.0000 mg | Freq: Once | INTRAMUSCULAR | Status: AC
  Administered 2019-11-18: 4 mg via INTRAVENOUS
  Filled 2019-11-18: qty 2

## 2019-11-18 MED ORDER — SODIUM CHLORIDE 0.9 % IV BOLUS: 500.0000 mL | Freq: Once | INTRAVENOUS | Status: DC

## 2019-11-18 MED ORDER — LORAZEPAM 2 MG/ML IJ SOLN: 0.0000 mg | Freq: Two times a day (BID) | INTRAMUSCULAR | Status: DC

## 2019-11-18 MED ORDER — SODIUM CHLORIDE 0.9 % IV SOLN
80.0000 mg | Freq: Once | INTRAVENOUS | Status: AC
  Administered 2019-11-18: 80 mg via INTRAVENOUS
  Filled 2019-11-18: qty 80

## 2019-11-18 MED ORDER — VITAMIN K1 1 MG/0.5ML IJ SOLN
10.0000 mg | Freq: Once | INTRAMUSCULAR | Status: DC
  Filled 2019-11-18: qty 5

## 2019-11-18 MED ORDER — HYDROXYZINE HCL 50 MG/ML IM SOLN
25.0000 mg | Freq: Four times a day (QID) | INTRAMUSCULAR | Status: DC | PRN
  Filled 2019-11-18 (×2): qty 0.5

## 2019-11-18 MED ORDER — SODIUM CHLORIDE 0.9 % IV BOLUS
500.0000 mL | Freq: Once | INTRAVENOUS | Status: AC
  Administered 2019-11-18: 500 mL via INTRAVENOUS

## 2019-11-18 MED ORDER — ADULT MULTIVITAMIN W/MINERALS CH
1.0000 | ORAL_TABLET | Freq: Every day | ORAL | Status: DC
  Administered 2019-11-19 – 2019-11-22 (×4): 1 via ORAL
  Filled 2019-11-18 (×4): qty 1

## 2019-11-18 MED ORDER — OXYCODONE HCL 5 MG PO TABS
10.0000 mg | ORAL_TABLET | Freq: Three times a day (TID) | ORAL | Status: DC | PRN
  Administered 2019-11-18 – 2019-11-21 (×7): 10 mg via ORAL
  Filled 2019-11-18 (×7): qty 2

## 2019-11-18 MED ORDER — LORAZEPAM 2 MG/ML IJ SOLN
0.0000 mg | Freq: Four times a day (QID) | INTRAMUSCULAR | Status: AC
  Administered 2019-11-20: 2 mg via INTRAVENOUS
  Filled 2019-11-18: qty 1

## 2019-11-18 MED ORDER — FOLIC ACID 1 MG PO TABS: 1.0000 mg | ORAL_TABLET | Freq: Every day | ORAL | Status: DC

## 2019-11-18 MED ORDER — AMIODARONE HCL 200 MG PO TABS
200.0000 mg | ORAL_TABLET | Freq: Every day | ORAL | Status: DC
  Administered 2019-11-18 – 2019-11-22 (×5): 200 mg via ORAL
  Filled 2019-11-18 (×5): qty 1

## 2019-11-18 MED ORDER — THIAMINE HCL 100 MG/ML IJ SOLN
100.0000 mg | Freq: Every day | INTRAMUSCULAR | Status: DC
  Administered 2019-11-22: 100 mg via INTRAVENOUS
  Filled 2019-11-18: qty 2

## 2019-11-18 MED ORDER — FOLIC ACID 1 MG PO TABS
1.0000 mg | ORAL_TABLET | Freq: Every day | ORAL | Status: DC
  Administered 2019-11-19 – 2019-11-22 (×4): 1 mg via ORAL
  Filled 2019-11-18 (×4): qty 1

## 2019-11-18 MED ORDER — NICOTINE 21 MG/24HR TD PT24
21.0000 mg | MEDICATED_PATCH | Freq: Every day | TRANSDERMAL | Status: DC
  Administered 2019-11-19 – 2019-11-22 (×4): 21 mg via TRANSDERMAL
  Filled 2019-11-18 (×4): qty 1

## 2019-11-18 MED ORDER — SODIUM CHLORIDE 0.9% IV SOLUTION
Freq: Once | INTRAVENOUS | Status: DC
  Filled 2019-11-18: qty 250

## 2019-11-18 MED ORDER — LORAZEPAM 1 MG PO TABS: 1.0000 mg | ORAL_TABLET | ORAL | Status: AC | PRN

## 2019-11-18 MED ORDER — THIAMINE HCL 100 MG PO TABS
100.0000 mg | ORAL_TABLET | Freq: Every day | ORAL | Status: DC
  Administered 2019-11-19 – 2019-11-21 (×3): 100 mg via ORAL
  Filled 2019-11-18 (×3): qty 1

## 2019-11-18 MED ORDER — THIAMINE HCL 100 MG PO TABS
100.0000 mg | ORAL_TABLET | Freq: Every day | ORAL | Status: DC
  Filled 2019-11-18: qty 1

## 2019-11-18 MED ORDER — OCTREOTIDE LOAD VIA INFUSION
50.0000 ug | Freq: Once | INTRAVENOUS | Status: AC
  Administered 2019-11-18: 50 ug via INTRAVENOUS
  Filled 2019-11-18: qty 25

## 2019-11-18 MED ORDER — HYDROXYZINE HCL 25 MG PO TABS
25.0000 mg | ORAL_TABLET | Freq: Four times a day (QID) | ORAL | Status: DC | PRN
  Filled 2019-11-18: qty 1

## 2019-11-18 NOTE — H&P (View-Only) (Signed)
GI Inpatient Consult Note  Reason for Consult: Hematemesis, acute blood loss anemia   Attending Requesting Consult: Dr. Ivor Costa, MD  History of Present Illness: Attila Andrade. is a 59 y.o. male seen for evaluation of hematemesis at the request of Dr. Blaine Hamper. Pt has a PMH of alcoholic cirrhosis and ongoing alcohol abuse, chronic diastolic CHF, PAF on amiodarone and metoprolol, gout, Hx of pancreatitis, gout, and HTN. He presented to the ED this afternoon for chief complaint of coffee-ground emesis. Emesis started this morning and has been very dark colored. He reports four episodes of hematemesis associated with nausea. Upon presentation to the ED, he was tachycardic at 120, hemoglobin 8.3 largely unchanged from his baseline hemoglobin, INR up to 1.6, and ammonia elevated at 85. He was started on Protonix and Octreotide infusions and given IV fluid hydration. Repeat hemoglobin this afternoon showed hemoglobin 6.6. He was ordered 2 units pRBCs. He had elevated LFTs with AST 158, ALT 43, total bilirubin 14.3, alk phos 256. His aminotransferases are at baseline given his liver disease, but the increased tbili is new. He reports his last alcoholic beverage was Wednesday night where he was three mixed drinks. He denies any recent NSAID use. He was hospitalized 04/10 - 10/10/19 for acute upper GI bleeding where he was seen by Dr. Allen Norris and Dr. Marius Ditch and underwent EGD 04/13 showing one superficial esophageal ulcer with no bleeding or stigmata of bleeding, mild PHG, and normal duodenum. Video capsule endoscopy performed 04/13 showed multiple diminutive non-bleeding small bowel erosions. He was advised to follow-up as an outpatient for hemoglobin check, but he did not show for this appointment. Last episode of emesis was around noon today while he was in the ED waiting room and was 200 cc of dark-colored emesis. He reports he had small episode of coffee-ground emesis about two hours ago. He denies any fever, chills,  dysphagia, abdominal pain, or unintentional weight loss. He has noticed his urine is very dark and doesn't know why this is. He has not had a BM today. He has a history of a subtotal colectomy with a colostomy that was reversed. He reports that this was done for 2 different masses found in his colon. He had a sigmoidoscopy in October 2020 that was normal by Dr. Vicente Males. He reports his nausea has resolved with antibiotics.    Flex Sig 04/08/2019:  - Patent end-to-side ileo-rectal anastomosis, characterized by healthy appearing mucosa. - The examination was otherwise normal. - No specimens collected.  Last Endoscopy: 10/09/2019 ([place]) - one superficial esophageal ulcer with no bleeding or stigmata of recent bleeding found in lower third of esophagus, 3 mm in largest dimension - mild portal hypertensive gastropathy - small hiatal hernia - normal duodenal bulb and second portion of duodenum  Endoscopy: 03/2019 Vicente Males) - Normal esophagus. - Normal examined duodenum. - Portal hypertensive gastropathy. - Medium-sized hiatal hernia. - No specimens collected.  Past Medical History:  Past Medical History:  Diagnosis Date  . CHF (congestive heart failure) ([place])   . Cirrhosis ([place])   . GERD (gastroesophageal reflux disease)   . Gout   . Hypertension   . Pancreatitis     Problem List: Patient Active Problem List   Diagnosis Date Noted  . Tobacco abuse 11/18/2019  . Hematemesis 11/18/2019  . Alcohol abuse   . Stage 3a chronic kidney disease   . Thrombocytopenia ([place])   . Essential hypertension   . AF (paroxysmal atrial fibrillation) ([place])   . Chronic diastolic CHF (congestive  heart failure) ([place])   . Alcoholic cirrhosis ([place])   . Gastrointestinal hemorrhage 10/06/2019  . COPD exacerbation ([place]) 10/06/2019  . Symptomatic anemia 04/06/2019  . Malnutrition of moderate degree 09/04/2018  . Sepsis ([place]) 08/26/2018  . Acute on chronic systolic CHF (congestive heart failure) ([place]) 08/17/2018   . Lactic acid acidosis 10/25/2017  . Generalized abdominal pain   . Hematemesis with nausea   . Acute blood loss anemia   . Gout attack 02/22/2017    Past Surgical History: Past Surgical History:  Procedure Laterality Date  . CARDIAC CATHETERIZATION    . COLON SURGERY    . ESOPHAGOGASTRODUODENOSCOPY N/A 04/08/2019   Procedure: ESOPHAGOGASTRODUODENOSCOPY (EGD);  Surgeon: Jonathon Bellows, MD;  Location: [facility] ENDOSCOPY;  Service: Gastroenterology;  Laterality: N/A;  . ESOPHAGOGASTRODUODENOSCOPY N/A 10/09/2019   Procedure: ESOPHAGOGASTRODUODENOSCOPY (EGD);  Surgeon: Lin Landsman, MD;  Location[facility] ENDOSCOPY;  Service: Gastroenterology;  Laterality: N/A;  . ESOPHAGOGASTRODUODENOSCOPY (EGD) WITH PROPOFOL N/A 10/26/2017   Procedure: ESOPHAGOGASTRODUODENOSCOPY (EGD) WITH PROPOFOL;  Surgeon: Jonathon Bellows, MD;  Location: [facility] ENDOSCOPY;  Service: Endoscopy;  Laterality: N/A;  . FLEXIBLE SIGMOIDOSCOPY N/A 04/08/2019   Procedure: FLEXIBLE SIGMOIDOSCOPY;  Surgeon: Jonathon Bellows, MD;  Location: [facility] ENDOSCOPY;  Service: Gastroenterology;  Laterality: N/A;  . GIVENS CAPSULE STUDY  10/09/2019   Procedure: GIVENS CAPSULE STUDY;  Surgeon: Lin Landsman, MD;  Location: [facility] ENDOSCOPY;  Service: Gastroenterology;;  . I & D EXTREMITY Right 09/05/2018   Procedure: IRRIGATION AND DEBRIDEMENT EXTREMITY;  Surgeon: Thornton Park, MD;  Location: [facility] ORS;  Service: Orthopedics;  Laterality: Right;  . KNEE ARTHROSCOPY Right 08/26/2018   Procedure: ARTHROSCOPY KNEE;  Surgeon: Thornton Park, MD;  Location: [facility] ORS;  Service: Orthopedics;  Laterality: Right;    Allergies: Allergies  Allergen Reactions  . Chlorhexidine     No  midline present  . Penicillins Rash    Has patient had a PCN reaction causing immediate rash, facial/tongue/throat swelling, SOB or lightheadedness with hypotension: No Has patient had a PCN reaction causing severe rash involving mucus membranes or skin necrosis: No Has patient  had a PCN reaction that required hospitalization: No Has patient had a PCN reaction occurring within the last 10 years: No If all of the above answers are "NO", then may proceed with Cephalosporin use.     Home Medications: (Not in a hospital admission)  Home medication reconciliation was completed with the patient.   Scheduled Inpatient Medications:   . sodium chloride   Intravenous Once  . nicotine  21 mg Transdermal Daily    Continuous Inpatient Infusions:   . sodium chloride    . octreotide  (SANDOSTATIN)    IV infusion 50 mcg/hr (11/18/19 1419)  . pantoprozole (PROTONIX) infusion 8 mg/hr (11/18/19 1333)  . sodium chloride      PRN Inpatient Medications:  albuterol, hydrOXYzine, hydrOXYzine  Family History: family history includes CAD in his father and mother; CVA in his mother.  The patient's family history is negative for inflammatory bowel disorders, GI malignancy, or solid organ transplantation.  Social History:   reports that he has been smoking cigarettes. He has been smoking about 1.00 pack per day. He has never used smokeless tobacco. He reports current alcohol use. He reports that he does not use drugs. The patient denies ETOH, tobacco, or drug use.   Review of Systems: Constitutional: Weight is stable.  Eyes: No changes in vision. ENT: No oral lesions, sore throat.  GI: see HPI.  Heme/Lymph: No easy bruising.  CV: No  chest pain.  GU: No hematuria.  Integumentary: No rashes.  Neuro: No headaches.  Psych: No depression/anxiety.  Endocrine: No heat/cold intolerance.  Allergic/Immunologic: No urticaria.  Resp: No cough, SOB.  Musculoskeletal: No joint swelling.    Physical Examination: BP 118/78   Pulse (!) 121   Temp 98.2 F (36.8 C) (Oral)   Resp 18   Ht 6' 1"  (1.854 m)   Wt 83 kg   SpO2 100%   BMI 24.14 kg/m  Gen: NAD, alert and oriented x 4 HEENT: PEERLA, EOMI, +scleral icterus Neck: supple, no JVD or thyromegaly Chest: CTA bilaterally, no  wheezes, crackles, or other adventitious sounds CV: tachycardic, regular rhythm, no m/g/c/r Abd: soft, ND, +BS in all four quadrants; mild tenderness to deep palpation in epigastric region, no HSM, guarding, ridigity, or rebound tenderness, abdominal hernia present with no signs of incarceration Ext:2+ pitting edema bilaterally, well perfused with 2+ pulses, Skin: no rash or lesions noted Lymph: no LAD  Data: Lab Results  Component Value Date   WBC 10.1 11/18/2019   HGB 6.6 (L) 11/18/2019   HCT 19.5 (L) 11/18/2019   MCV 106.6 (H) 11/18/2019   PLT 93 (L) 11/18/2019   Recent Labs  Lab 11/18/19 1240 11/18/19 1416  HGB 8.3* 6.6*   Lab Results  Component Value Date   NA 139 11/18/2019   K 3.6 11/18/2019   CL 90 (L) 11/18/2019   CO2 25 11/18/2019   BUN 23 (H) 11/18/2019   CREATININE 1.28 (H) 11/18/2019   Lab Results  Component Value Date   ALT 43 11/18/2019   AST 158 (H) 11/18/2019   ALKPHOS 256 (H) 11/18/2019   BILITOT 14.3 (H) 11/18/2019   Recent Labs  Lab 11/18/19 1240 11/18/19 1416  APTT  --  41*  INR 1.6*  --    Assessment/Plan:  59 y/o Caucasian male with a PMH of alcoholic cirrhosis, congestive heart failure, PAF on amiodarone, GERD, HTN, gout, alcohol abuse presented to the ED for complaints of nausea and coffee-ground emesis  1. Upper GI bleeding 2/2 hematemesis with acute blood loss anemia - DDx includes esophagitis, esophageal ulcer, PUD, gastritis, esophageal varices (no prior history of EV on two previous EGDs), AVMs, duodenitis, Dieulafoy's lesion, GAVE, or malignancy  2. Alcoholic cirrhosis - MELD 24, Child's Class C (11 pts), with thrombocytopenia and evidence of coagulopathy (INR 1.6)  3. Alcoholic hepatitis - Maddrey DF 34.5, would benefit from administration of glucocorticoid treatment but will hold off until bleeding source is identified  4. Chronic diastolic CHF - EF 76/8115 55-60%, no acute CHF exacerbation  5. CKD Stage III    COVID-19  Test: NEGATIVE  Recommendations:  - Upper GI bleeding. Agree with transfusions as necessary to ensure Hgb >7.0. - Agree with IV Protonix and octreotide infusions - Administer Vitamin K 10 mg SQ given elevated PT - He is receiving 2 units pRBCs this afternoon. No evidence of active GIB at present time.  - Advise EGD for diagnostic and potentially therapeutic reasons. We will plan on this to be performed tomorrow morning with Dr. Alice Reichert.  - If evidence of ongoing GIB with recurrent hematemesis episodes, EGD will need to be performed emergently. Please call Dr. Alice Reichert for any active bleeding - Patient has evidence of severe alcoholic hepatitis with a Maddrey DF of 34.5 - NPO - EGD tomorrow with Dr. Alice Reichert - Further recommendations after EGD tomorrow  I reviewed the risks (including bleeding, perforation, infection, anesthesia complications, cardiac/respiratory complications), benefits and alternatives of  EGD. Patient consents to proceed.    Thank you for the consult. Please call with questions or concerns.  Reeves Forth [facility] [phone] (Cell)

## 2019-11-18 NOTE — ED Notes (Signed)
Pt to ED bed 2 via wc from triage with c/o blood emesis since yesterday.  Pt with known esophageal varices and cirrhosis secondary to alcoholism.  Generalized weakness noted upon transfer to bed.  GCS 15, PEARL, jaundice sclera, rr even/unlbored with equal rise and fall.  abd distended, skin jaundice, warm and dry.  Tachy in 120s pplaced on bedside monitor.  ED attending and team at bedside for assessment.

## 2019-11-18 NOTE — ED Triage Notes (Signed)
Cirrhosis pt here for hematemesis.  Emesis is coffee ground.  Tachy in triage.  Has not had bowel movement since this started.  Jaundiced.

## 2019-11-18 NOTE — H&P (Signed)
History and Physical    Brown Human. OVZ:[id number] DOB: May 14, 1961 DOA: 11/18/2019  Referring MD/NP/PA:   PCP: Gildardo Pounds, PA   Patient coming from:  The patient is coming from home.  At baseline, pt is independent for most of ADL.        Chief Complaint: Hematemesis  HPI: Larry Andrade. is a 59 y.o. male with medical history significant of alcohol abuse, alcoholic cirrhosis obesity esophageal varices, hypertension, COPD, GERD, gout, pancreatitis, atrial fibrillation on anticoagulants, CKD stage III, thrombocytopenia, tobacco abuse, who presents with hematemesis.  Patient has been having hematemesis since yesterday.  Patient has had 4 episodes of hematemesis with dark-colored blood.  Patient does not have diarrhea.  Patient has mild central abdominal pain.  No chest pain, shortness breath.  Patient has chronic cough, which has not changed.  Denies symptoms of UTI or unilateral weakness. Patient has abdominal distention and skin jaundice.  ED Course: pt was found to have hemoglobin 8.3 -->6.6 (8.0 10/10/2019), pending COVID-19 PCR, stable renal function, abnormal liver function (ALP 256, AST 158, ALT 43, total bilirubin 14.3), temperature normal, tachycardia, blood pressure 126/72, RR 22, oxygen saturation 92% on room air.   Review of Systems:   General: no fevers, chills, no body weight gain, has fatigue HEENT: no blurry vision, hearing changes or sore throat Respiratory: no dyspnea, has coughing, no wheezing CV: no chest pain, no palpitations GI: no nausea, vomiting, has abdominal pain, no diarrhea, constipation. Has hematemesis GU: no dysuria, burning on urination, increased urinary frequency, hematuria  Ext: has leg edema Neuro: no unilateral weakness, numbness, or tingling, no vision change or hearing loss Skin: no rash, no skin tear. Has jaundice MSK: No muscle spasm, no deformity, no limitation of range of movement in spin Heme: No easy bruising.  Travel history: No  recent long distant travel.  Allergy:  Allergies  Allergen Reactions  . Chlorhexidine     No  midline present  . Penicillins Rash    Has patient had a PCN reaction causing immediate rash, facial/tongue/throat swelling, SOB or lightheadedness with hypotension: No Has patient had a PCN reaction causing severe rash involving mucus membranes or skin necrosis: No Has patient had a PCN reaction that required hospitalization: No Has patient had a PCN reaction occurring within the last 10 years: No If all of the above answers are "NO", then may proceed with Cephalosporin use.     Past Medical History:  Diagnosis Date  . CHF (congestive heart failure) (HCC)   . Cirrhosis (H[facility])   . GERD (gastroesophageal reflux disease)   . Gout   . Hypertension   . Pancreatitis     Past Surgical History:  Procedure Laterality Date  . CARDIAC CATHETERIZATION    . COLON SURGERY    . ESOPHAGOGASTRODUODENOSCOPY N/A 04/08/2019   Procedure: ESOPHAGOGASTRODUODENOSCOPY (EGD);  Surgeon: [name], MD;  Location: [facility] ENDOSCOPY;  Service: Gastroenterology;  Laterality: N/A;  . ESOPHAGOGASTRODUODENOSCOPY N/A 10/09/2019   Procedure: ESOPHAGOGASTRODUODENOSCOPY (EGD);  Surgeon: Toney Reil, MD;  Location: [facility] ENDOSCOPY;  Service: Gastroenterology;  Laterality: N/A;  . ESOPHAGOGASTRODUODENOSCOPY (EGD) WITH PROPOFOL N/A 10/26/2017   Procedure: ESOPHAGOGASTRODUODENOSCOPY (EGD) WITH PROPOFOL;  Surgeon: [name], MD;  Location: [facility] ENDOSCOPY;  Service: Endoscopy;  Laterality: N/A;  . FLEXIBLE SIGMOIDOSCOPY N/A 04/08/2019   Procedure: FLEXIBLE SIGMOIDOSCOPY;  Surgeon: [name], MD;  Location: [facility] ENDOSCOPY;  Service: Gastroenterology;  Laterality: N/A;  . GIVENS CAPSULE STUDY  10/09/2019   Procedure: GIVENS CAPSULE STUDY;  Surgeon: Allegra Lai,  Loel Dubonnet, MD;  Location: [facility] ENDOSCOPY;  Service: Gastroenterology;;  . I & D EXTREMITY Right 09/05/2018   Procedure: IRRIGATION AND DEBRIDEMENT EXTREMITY;  Surgeon:  Juanell Fairly, MD;  Location: [facility] ORS;  Service: Orthopedics;  Laterality: Right;  . KNEE ARTHROSCOPY Right 08/26/2018   Procedure: ARTHROSCOPY KNEE;  Surgeon: Juanell Fairly, MD;  Location: [facility] ORS;  Service: Orthopedics;  Laterality: Right;    Social History:  reports that he has been smoking cigarettes. He has been smoking about 1.00 pack per day. He has never used smokeless tobacco. He reports current alcohol use. He reports that he does not use drugs.  Family History:  Family History  Problem Relation Age of Onset  . CVA Mother   . CAD Mother   . CAD Father      Prior to Admission medications   Medication Sig Start Date End Date Taking? Authorizing Provider  albuterol (PROVENTIL) (2.5 MG/3ML) 0.083% nebulizer solution Take 2.5 mg by nebulization every 6 (six) hours as needed for wheezing or shortness of breath.    [provider]  amiodarone (PACERONE) 200 MG tablet Take 1 tablet (200 mg total) by mouth daily. 04/09/19   Delfino Lovett, MD  Cyanocobalamin (VITAMIN B-12) 500 MCG TABS Take 500 mcg by mouth daily. 10/11/19   Alford Highland, MD  folic acid (FOLVITE) 1 MG tablet Take 1 tablet (1 mg total) by mouth daily. 10/11/19   Alford Highland, MD  metoprolol succinate (TOPROL-XL) 25 MG 24 hr tablet Take 25 mg by mouth 2 (two) times daily. 01/15/19   [provider]  Multiple Vitamin (MULTIVITAMIN WITH MINERALS) TABS tablet Take 1 tablet by mouth daily. 10/26/17   Alford Highland, MD  pantoprazole (PROTONIX) 40 MG tablet Take 1 tablet (40 mg total) by mouth daily. 10/10/19 10/09/20  Alford Highland, MD  thiamine 100 MG tablet Take 1 tablet (100 mg total) by mouth daily. 10/11/19   Alford Highland, MD    Physical Exam: Vitals:   11/18/19 1430 11/18/19 1500 11/18/19 1530 11/18/19 1630  BP: (!) 134/92 128/87 (!) 113/95 122/74  Pulse: (!) 39 (!) 120 (!) 124 (!) 121  Resp: 18 19 (!) 22 18  Temp:      TempSrc:      SpO2: 96% 93% 97% 96%  Weight:      Height:         General: Not in acute distress HEENT:       Eyes: PERRL, EOMI, has scleral icterus.  Pale looking       ENT: No discharge from the ears and nose, no pharynx injection, no tonsillar enlargement.        Neck: No JVD, no bruit, no mass felt. Heme: No neck lymph node enlargement. Cardiac: S1/S2, RRR, No murmurs, No gallops or rubs. Respiratory: No rales, wheezing, rhonchi or rubs. GI: Soft, distended, mild central tenderness, no rebound pain, no organomegaly, BS present. GU: No hematuria Ext: 2+ pitting leg edema bilaterally. 2+DP/PT pulse bilaterally. Musculoskeletal: No joint deformities, No joint redness or warmth, no limitation of ROM in spin. Skin: No rashes.  Neuro: Alert, oriented X3, cranial nerves II-XII grossly intact, moves all extremities normally.  Psych: Patient is not psychotic, no suicidal or hemocidal ideation.  Labs on Admission: I have personally reviewed following labs and imaging studies  CBC: Recent Labs  Lab 11/18/19 1240 11/18/19 1416  WBC 10.9* 10.1  HGB 8.3* 6.6*  HCT 24.5* 19.5*  MCV 105.2* 106.6*  PLT 112* 93*   Basic Metabolic  Panel: Recent Labs  Lab 11/18/19 1240  NA 139  K 3.6  CL 90*  CO2 25  GLUCOSE 117*  BUN 23*  CREATININE 1.28*  CALCIUM 8.2*   GFR: Estimated Creatinine Clearance: 72.8 mL/min (A) (by C-G formula based on SCr of 1.28 mg/dL (H)). Liver Function Tests: Recent Labs  Lab 11/18/19 1240  AST 158*  ALT 43  ALKPHOS 256*  BILITOT 14.3*  PROT 7.9  ALBUMIN 2.5*   No results for input(s): LIPASE, AMYLASE in the last 168 hours. Recent Labs  Lab 11/18/19 1249  AMMONIA 85*   Coagulation Profile: Recent Labs  Lab 11/18/19 1240  INR 1.6*   Cardiac Enzymes: No results for input(s): CKTOTAL, CKMB, CKMBINDEX, TROPONINI in the last 168 hours. BNP (last 3 results) No results for input(s): PROBNP in the last 8760 hours. HbA1C: No results for input(s): HGBA1C in the last 72 hours. CBG: No results for input(s): GLUCAP  in the last 168 hours. Lipid Profile: No results for input(s): CHOL, HDL, LDLCALC, TRIG, CHOLHDL, LDLDIRECT in the last 72 hours. Thyroid Function Tests: No results for input(s): TSH, T4TOTAL, FREET4, T3FREE, THYROIDAB in the last 72 hours. Anemia Panel: No results for input(s): VITAMINB12, FOLATE, FERRITIN, TIBC, IRON, RETICCTPCT in the last 72 hours. Urine analysis:    Component Value Date/Time   COLORURINE YELLOW (A) 10/07/2019 0513   APPEARANCEUR CLEAR (A) 10/07/2019 0513   LABSPEC 1.008 10/07/2019 0513   PHURINE 5.0 10/07/2019 0513   GLUCOSEU NEGATIVE 10/07/2019 0513   HGBUR SMALL (A) 10/07/2019 0513   BILIRUBINUR NEGATIVE 10/07/2019 0513   KETONESUR NEGATIVE 10/07/2019 0513   PROTEINUR NEGATIVE 10/07/2019 0513   NITRITE NEGATIVE 10/07/2019 0513   LEUKOCYTESUR NEGATIVE 10/07/2019 0513   Sepsis Labs: @LABRCNTIP (procalcitonin:4,lacticidven:4) )No results found for this or any previous visit (from the past 240 hour(s)).   Radiological Exams on Admission: No results found.   EKG: Independently reviewed.  Sinus rhythm, tachycardia, bifascicular block, QTC 526, early R wave progression  Assessment/Plan Principal Problem:   Hematemesis Active Problems:   Acute blood loss anemia   Gastrointestinal hemorrhage   Alcoholic cirrhosis (HCC)   Alcohol abuse   Stage 3a chronic kidney disease   Thrombocytopenia (HCC)   Essential hypertension   AF (paroxysmal atrial fibrillation) (HCC)   Chronic diastolic CHF (congestive heart failure) (HCC)   Tobacco abuse   Hematemesis with nausea and acute blood loss anemia: Hgb 8.0 on 10/10/19 -->8.3 --> 6.6.  Hemodynamically stable, but tachycardia. Pt has known esophageal varices. Dr. Alice Reichert is consulted  - Placed on progressive unit for observation -Transfuse 2 unit of blood - GI consulted by Ed, will follow up recommendations - NPO for possible EGD - IVF: 500 mL NS bolus x 2, then at 75 mL/hr - Start IV pantoprazole gtt and  octreotide gtt - prn hydroxyzine IV for nausea (patient has some QT prolongation, cannot use Zofran) - Avoid NSAIDs and SQ heparin - Maintain IV access (2 large bore IVs if possible). - Monitor closely and follow q6h cbc, transfuse as necessary, if Hgb<7.0 - LaB: INR, PTT and type screen  Alcoholic cirrhosis ([place]): ALP 256, AST 158, ALT 43, total bilirubin 14.3 -Check INR, PTT -Follow-up GS recommendation  Tobacco abuse and Alcohol abuse: -Nicotine patch -CIWA protocol  Stage 3a chronic kidney disease: stable -f/u renal Fx by BMP  Thrombocytopenia ([place]): Platelet 112.  Most likely due to liver cirrhosis -f/u by CBC  HTN:  -Hold Lasix and amlodipine since patient is at risk of developing  hypotension due to GI bleeding -Continue home medications: Metoprolol  AF (paroxysmal atrial fibrillation) (HCC) -Continue metoprolol and amiodarone  Chronic diastolic CHF (congestive heart failure) (HCC): 2D echo on 08/17/2018 showed EF of 55 to 60%.  Patient has 2+ leg edema, but no respiratory distress, BNP 125.  Does not seem to have CHF exacerbation. -Hold Lasix since patient at high risk of developing hypotension from GI bleeding    DVT ppx: SCD Code Status: Full code Family Communication: not done, no family member is at bed side.   Disposition Plan:  Anticipate discharge back to previous home environment Consults called: Gastroenterology, Dr. Norma Fredrickson Admission status:  SDU/inpation          Status is: Inpatient  Remains inpatient appropriate because:Inpatient level of care appropriate due to severity of illness.  Patient has multiple comorbidities, including alcoholic liver cirrhosis, and known esophageal varices, now presents with hematemesis.  Hemoglobin dropped from 8.3 to 6.6.  Patient is at high risk for developing massive GI bleeding.  Will need to be treated in hospital for at least 2 days.   Dispo: The patient is from: Home              Anticipated d/c is to: Home               Anticipated d/c date is: 2 days              Patient currently is not medically stable to d/c.            Date of Service 11/18/2019    Lorretta Harp [facility]   If 7PM-7AM, please contact night-coverage [email or web address] 11/18/2019, 5:09 PM

## 2019-11-18 NOTE — Consult Note (Signed)
GI Inpatient Consult Note  Reason for Consult: Hematemesis, acute blood loss anemia   Attending Requesting Consult: Dr. Ivor Costa, MD  History of Present Illness: Larry Andrade. is a 59 y.o. male seen for evaluation of hematemesis at the request of Dr. Blaine Hamper. Pt has a PMH of alcoholic cirrhosis and ongoing alcohol abuse, chronic diastolic CHF, PAF on amiodarone and metoprolol, gout, Hx of pancreatitis, gout, and HTN. He presented to the ED this afternoon for chief complaint of coffee-ground emesis. Emesis started this morning and has been very dark colored. He reports four episodes of hematemesis associated with nausea. Upon presentation to the ED, he was tachycardic at 120, hemoglobin 8.3 largely unchanged from his baseline hemoglobin, INR up to 1.6, and ammonia elevated at 85. He was started on Protonix and Octreotide infusions and given IV fluid hydration. Repeat hemoglobin this afternoon showed hemoglobin 6.6. He was ordered 2 units pRBCs. He had elevated LFTs with AST 158, ALT 43, total bilirubin 14.3, alk phos 256. His aminotransferases are at baseline given his liver disease, but the increased tbili is new. He reports his last alcoholic beverage was Wednesday night where he was three mixed drinks. He denies any recent NSAID use. He was hospitalized 04/10 - 10/10/19 for acute upper GI bleeding where he was seen by Dr. Allen Norris and Dr. Marius Ditch and underwent EGD 04/13 showing one superficial esophageal ulcer with no bleeding or stigmata of bleeding, mild PHG, and normal duodenum. Video capsule endoscopy performed 04/13 showed multiple diminutive non-bleeding small bowel erosions. He was advised to follow-up as an outpatient for hemoglobin check, but he did not show for this appointment. Last episode of emesis was around noon today while he was in the ED waiting room and was 200 cc of dark-colored emesis. He reports he had small episode of coffee-ground emesis about two hours ago. He denies any fever, chills,  dysphagia, abdominal pain, or unintentional weight loss. He has noticed his urine is very dark and doesn't know why this is. He has not had a BM today. He has a history of a subtotal colectomy with a colostomy that was reversed. He reports that this was done for 2 different masses found in his colon. He had a sigmoidoscopy in October 2020 that was normal by Dr. Vicente Males. He reports his nausea has resolved with antibiotics.    Flex Sig 04/08/2019:  - Patent end-to-side ileo-rectal anastomosis, characterized by healthy appearing mucosa. - The examination was otherwise normal. - No specimens collected.  Last Endoscopy: 10/09/2019 ([place]) - one superficial esophageal ulcer with no bleeding or stigmata of recent bleeding found in lower third of esophagus, 3 mm in largest dimension - mild portal hypertensive gastropathy - small hiatal hernia - normal duodenal bulb and second portion of duodenum  Endoscopy: 03/2019 Vic[facility] Males) - Normal esophagus. - Normal examined duodenum. - Portal hypertensive gastropathy. - Medium-sized hiatal hernia. - No specimens collected.  Past Medical History:  Past Medical History:  Diagnosis Date  . CHF (congestive heart failure) ([place])   . Cirrhosis ([place])   . GERD (gastroesophageal reflux disease)   . Gout   . Hypertension   . Pancreatitis     Problem List: Patient Active Problem List   Diagnosis Date Noted  . Tobacco abuse 11/18/2019  . Hematemesis 11/18/2019  . Alcohol abuse   . Stage 3a chronic kidney disease   . Thrombocytopenia ([place])   . Essential hypertension   . AF (paroxysmal atrial fibrillation) ([place])   . Chronic diastolic CHF (congestive  heart failure) ([place])   . Alcoholic cirrhosis ([place])   . Gastrointestinal hemorrhage 10/06/2019  . COPD exacerbation ([place]) 10/06/2019  . Symptomatic anemia 04/06/2019  . Malnutrition of moderate degree 09/04/2018  . Sepsis ([place]) 08/26/2018  . Acute on chronic systolic CHF (congestive heart failure) ([place]) 08/17/2018   . Lactic acid acidosis 10/25/2017  . Generalized abdominal pain   . Hematemesis with nausea   . Acute blood loss anemia   . Gout attack 02/22/2017    Past Surgical History: Past Surgical History:  Procedure Laterality Date  . CARDIAC CATHETERIZATION    . COLON SURGERY    . ESOPHAGOGASTRODUODENOSCOPY N/A 04/08/2019   Procedure: ESOPHAGOGASTRODUODENOSCOPY (EGD);  Surgeon: Jon[place] Bellows, MD;  Location: [facility] ENDOSCOPY;  Service: Gastroenterology;  Laterality: N/A;  . ESOPHAGOGASTRODUODENOSCOPY N/A 10/09/2019   Procedure: ESOPHAGOGASTRODUODENOSCOPY (EGD);  Surgeon: Lin Landsman, MD;  Location: [facility] ENDOSCOPY;  Service: Gastroenterology;  Laterality: N/A;  . ESOPHAGOGASTRODUODENOSCOPY (EGD) WITH PROPOFOL N/A 10/26/2017   Procedure: ESOPHAGOGASTRODUODENOSCOPY (EGD) WITH PROPOFOL;  Surgeon: [place] Bellows, MD;  Location: [facility] ENDOSCOPY;  Service: Endoscopy;  Laterality: N/A;  . FLEXIBLE SIGMOIDOSCOPY N/A 04/08/2019   Procedure: FLEXIBLE SIGMOIDOSCOPY;  Surgeon: Jon[place] Bellows, MD;  Location: [facility] ENDOSCOPY;  Service: Gastroenterology;  Laterality: N/A;  . GIVENS CAPSULE STUDY  10/09/2019   Procedure: GIVENS CAPSULE STUDY;  Surgeon: Lin Landsman, MD;  Location: [facility] ENDOSCOPY;  Service: Gastroenterology;;  . I & D EXTREMITY Right 09/05/2018   Procedure: IRRIGATION AND DEBRIDEMENT EXTREMITY;  Surgeon: Thornton Park, MD;  Location: [facility] ORS;  Service: Orthopedics;  Laterality: Right;  . KNEE ARTHROSCOPY Right 08/26/2018   Procedure: ARTHROSCOPY KNEE;  Surgeon: Thornton Park, MD;  Location: [facility] ORS;  Service: Orthopedics;  Laterality: Right;    Allergies: Allergies  Allergen Reactions  . Chlorhexidine     No  midline present  . Penicillins Rash    Has patient had a PCN reaction causing immediate rash, facial/tongue/throat swelling, SOB or lightheadedness with hypotension: No Has patient had a PCN reaction causing severe rash involving mucus membranes or skin necrosis: No Has patient  had a PCN reaction that required hospitalization: No Has patient had a PCN reaction occurring within the last 10 years: No If all of the above answers are "NO", then may proceed with Cephalosporin use.     Home Medications: (Not in a hospital admission)  Home medication reconciliation was completed with the patient.   Scheduled Inpatient Medications:   . sodium chloride   Intravenous Once  . nicotine  21 mg Transdermal Daily    Continuous Inpatient Infusions:   . sodium chloride    . octreotide  (SANDOSTATIN)    IV infusion 50 mcg/hr (11/18/19 1419)  . pantoprozole (PROTONIX) infusion 8 mg/hr (11/18/19 1333)  . sodium chloride      PRN Inpatient Medications:  albuterol, hydrOXYzine, hydrOXYzine  Family History: family history includes CAD in his father and mother; CVA in his mother.  The patient's family history is negative for inflammatory bowel disorders, GI malignancy, or solid organ transplantation.  Social History:   reports that he has been smoking cigarettes. He has been smoking about 1.00 pack per day. He has never used smokeless tobacco. He reports current alcohol use. He reports that he does not use drugs. The patient denies ETOH, tobacco, or drug use.   Review of Systems: Constitutional: Weight is stable.  Eyes: No changes in vision. ENT: No oral lesions, sore throat.  GI: see HPI.  Heme/Lymph: No easy bruising.  CV: No  chest pain.  GU: No hematuria.  Integumentary: No rashes.  Neuro: No headaches.  Psych: No depression/anxiety.  Endocrine: No heat/cold intolerance.  Allergic/Immunologic: No urticaria.  Resp: No cough, SOB.  Musculoskeletal: No joint swelling.    Physical Examination: BP 118/78   Pulse (!) 121   Temp 98.2 F (36.8 C) (Oral)   Resp 18   Ht 6' 1"  (1.854 m)   Wt 83 kg   SpO2 100%   BMI 24.14 kg/m  Gen: NAD, alert and oriented x 4 HEENT: PEERLA, EOMI, +scleral icterus Neck: supple, no JVD or thyromegaly Chest: CTA bilaterally, no  wheezes, crackles, or other adventitious sounds CV: tachycardic, regular rhythm, no m/g/c/r Abd: soft, ND, +BS in all four quadrants; mild tenderness to deep palpation in epigastric region, no HSM, guarding, ridigity, or rebound tenderness, abdominal hernia present with no signs of incarceration Ext:2+ pitting edema bilaterally, well perfused with 2+ pulses, Skin: no rash or lesions noted Lymph: no LAD  Data: Lab Results  Component Value Date   WBC 10.1 11/18/2019   HGB 6.6 (L) 11/18/2019   HCT 19.5 (L) 11/18/2019   MCV 106.6 (H) 11/18/2019   PLT 93 (L) 11/18/2019   Recent Labs  Lab 11/18/19 1240 11/18/19 1416  HGB 8.3* 6.6*   Lab Results  Component Value Date   NA 139 11/18/2019   K 3.6 11/18/2019   CL 90 (L) 11/18/2019   CO2 25 11/18/2019   BUN 23 (H) 11/18/2019   CREATININE 1.28 (H) 11/18/2019   Lab Results  Component Value Date   ALT 43 11/18/2019   AST 158 (H) 11/18/2019   ALKPHOS 256 (H) 11/18/2019   BILITOT 14.3 (H) 11/18/2019   Recent Labs  Lab 11/18/19 1240 11/18/19 1416  APTT  --  41*  INR 1.6*  --    Assessment/Plan:  59 y/o Caucasian male with a PMH of alcoholic cirrhosis, congestive heart failure, PAF on amiodarone, GERD, HTN, gout, alcohol abuse presented to the ED for complaints of nausea and coffee-ground emesis  1. Upper GI bleeding 2/2 hematemesis with acute blood loss anemia - DDx includes esophagitis, esophageal ulcer, PUD, gastritis, esophageal varices (no prior history of EV on two previous EGDs), AVMs, duodenitis, Dieulafoy's lesion, GAVE, or malignancy  2. Alcoholic cirrhosis - MELD 24, Child's Class C (11 pts), with thrombocytopenia and evidence of coagulopathy (INR 1.6)  3. Alcoholic hepatitis - Maddrey DF 34.5, would benefit from administration of glucocorticoid treatment but will hold off until bleeding source is identified  4. Chronic diastolic CHF - EF 16/1096 55-60%, no acute CHF exacerbation  5. CKD Stage III    COVID-19  Test: NEGATIVE  Recommendations:  - Upper GI bleeding. Agree with transfusions as necessary to ensure Hgb >7.0. - Agree with IV Protonix and octreotide infusions - Administer Vitamin K 10 mg SQ given elevated PT - He is receiving 2 units pRBCs this afternoon. No evidence of active GIB at present time.  - Advise EGD for diagnostic and potentially therapeutic reasons. We will plan on this to be performed tomorrow morning with Dr. Alice Reichert.  - If evidence of ongoing GIB with recurrent hematemesis episodes, EGD will need to be performed emergently. Please call Dr. Alice Reichert for any active bleeding - Patient has evidence of severe alcoholic hepatitis with a Maddrey DF of 34.5 - NPO - EGD tomorrow with Dr. Alice Reichert - Further recommendations after EGD tomorrow  I reviewed the risks (including bleeding, perforation, infection, anesthesia complications, cardiac/respiratory complications), benefits and alternatives of  EGD. Patient consents to proceed.    Thank you for the consult. Please call with questions or concerns.  [facility] [phone] (Cell)

## 2019-11-18 NOTE — ED Provider Notes (Signed)
[facility] Emergency Department Provider Note  Time seen: 12:57 PM  I have reviewed the triage vital signs and the nursing notes.   HISTORY  Chief Complaint Hematemesis   HPI Larry Andrade. is a 59 y.o. male with a past medical history of CHF, gastric reflux, cirrhosis, hypertension, alcohol use, presents to the emergency department for dark vomit.  According to the patient  since this morning he has been experiencing dark vomitus and nausea.  Denies any abdominal pain.  Has not noted any black or bloody stool.  Patient has known liver disease is quite jaundiced.  Patient states he follows up with [facility] but has not been in a long time per patient.  Does admit to continued alcohol use.  Patient with approximately 200 cc of dark vomitus in the room, strongly guaiac positive.  Past Medical History:  Diagnosis Date  . CHF (congestive heart failure) (HCC)   . Cirrhosis (H[facility])   . GERD (gastroesophageal reflux disease)   . Gout   . Hypertension   . Pancreatitis     Patient Active Problem List   Diagnosis Date Noted  . Alcohol abuse   . Stage 3a chronic kidney disease   . Thrombocytopenia (H[facility])   . Essential hypertension   . AF (paroxysmal atrial fibrillation) (H[facility])   . Chronic diastolic CHF (congestive heart failure) (H[facility])   . Alcoholic cirrhosis (H[facility])   . Acute upper GI bleeding 10/06/2019  . COPD exacerbation (H[facility]) 10/06/2019  . Symptomatic anemia 04/06/2019  . Malnutrition of moderate degree 09/04/2018  . Sepsis (HCC) 08/26/2018  . Acute on chronic systolic CHF (congestive heart failure) (HCC) 08/17/2018  . Lactic acid acidosis 10/25/2017  . Generalized abdominal pain   . Hematemesis with nausea   . Acute blood loss anemia   . Gout attack 02/22/2017    Past Surgical History:  Procedure Laterality Date  . CARDIAC CATHETERIZATION    . COLON SURGERY    . ESOPHAGOGASTRODUODENOSCOPY N/A 04/08/2019   Procedure: ESOPHAGOGASTRODUODENOSCOPY (EGD);   Surgeon: [name], MD;  Location: [facility] ENDOSCOPY;  Service: Gastroenterology;  Laterality: N/A;  . ESOPHAGOGASTRODUODENOSCOPY N/A 10/09/2019   Procedure: ESOPHAGOGASTRODUODENOSCOPY (EGD);  Surgeon: Toney Reil, MD;  Location: [facility] ENDOSCOPY;  Service: Gastroenterology;  Laterality: N/A;  . ESOPHAGOGASTRODUODENOSCOPY (EGD) WITH PROPOFOL N/A 10/26/2017   Procedure: ESOPHAGOGASTRODUODENOSCOPY (EGD) WITH PROPOFOL;  Surgeon: [name], MD;  Location: [facility] ENDOSCOPY;  Service: Endoscopy;  Laterality: N/A;  . FLEXIBLE SIGMOIDOSCOPY N/A 04/08/2019   Procedure: FLEXIBLE SIGMOIDOSCOPY;  Surgeon: [name], MD;  Location: [facility] ENDOSCOPY;  Service: Gastroenterology;  Laterality: N/A;  . GIVENS CAPSULE STUDY  10/09/2019   Procedure: GIVENS CAPSULE STUDY;  Surgeon: Toney Reil, MD;  Location: [facility] ENDOSCOPY;  Service: Gastroenterology;;  . I & D EXTREMITY Right 09/05/2018   Procedure: IRRIGATION AND DEBRIDEMENT EXTREMITY;  Surgeon: Juanell Fairly, MD;  Location: [facility] ORS;  Service: Orthopedics;  Laterality: Right;  . KNEE ARTHROSCOPY Right 08/26/2018   Procedure: ARTHROSCOPY KNEE;  Surgeon: Juanell Fairly, MD;  Location: [facility] ORS;  Service: Orthopedics;  Laterality: Right;    Prior to Admission medications   Medication Sig Start Date End Date Taking? Authorizing Provider  albuterol (PROVENTIL) (2.5 MG/3ML) 0.083% nebulizer solution Take 2.5 mg by nebulization every 6 (six) hours as needed for wheezing or shortness of breath.    [provider]  amiodarone (PACERONE) 200 MG tablet Take 1 tablet (200 mg total) by mouth daily. 04/09/19   Delfino Lovett, MD  Cyanocobalamin (VITAMIN B-12)  500 MCG TABS Take 500 mcg by mouth daily. 10/11/19   Loletha Grayer, MD  folic acid (FOLVITE) 1 MG tablet Take 1 tablet (1 mg total) by mouth daily. 10/11/19   Loletha Grayer, MD  metoprolol succinate (TOPROL-XL) 25 MG 24 hr tablet Take 25 mg by mouth 2 (two) times daily. 01/15/19   [provider]  Multiple Vitamin (MULTIVITAMIN WITH MINERALS) TABS tablet Take 1 tablet by mouth daily. 10/26/17   Loletha Grayer, MD  pantoprazole (PROTONIX) 40 MG tablet Take 1 tablet (40 mg total) by mouth daily. 10/10/19 10/09/20  Loletha Grayer, MD  thiamine 100 MG tablet Take 1 tablet (100 mg total) by mouth daily. 10/11/19   Loletha Grayer, MD    Allergies  Allergen Reactions  . Chlorhexidine     No  midline present  . Penicillins Rash    Has patient had a PCN reaction causing immediate rash, facial/tongue/throat swelling, SOB or lightheadedness with hypotension: No Has patient had a PCN reaction causing severe rash involving mucus membranes or skin necrosis: No Has patient had a PCN reaction that required hospitalization: No Has patient had a PCN reaction occurring within the last 10 years: No If all of the above answers are "NO", then may proceed with Cephalosporin use.     Family History  Problem Relation Age of Onset  . CVA Mother   . CAD Mother   . CAD Father     Social History Social History   Tobacco Use  . Smoking status: Current Every Day Smoker    Packs/day: 1.00    Types: Cigarettes  . Smokeless tobacco: Never Used  Substance Use Topics  . Alcohol use: Yes    Comment: 1 pint of vodka a day  . Drug use: No    Review of Systems Constitutional: Negative for fever Cardiovascular: Negative for chest pain. Respiratory: Negative for shortness of breath. Gastrointestinal: Negative for abdominal pain.  Positive for nausea.  Positive for dark vomitus. Musculoskeletal: Negative for musculoskeletal complaints Skin: Jaundiced skin. Neurological: Negative for headache All other ROS negative  ____________________________________________   PHYSICAL EXAM:  VITAL SIGNS: ED Triage Vitals  Enc Vitals Group     BP 11/18/19 1223 (!) 142/89     Pulse Rate 11/18/19 1223 (!) 127     Resp 11/18/19 1223 20     Temp 11/18/19 1223 98 F (36.7 C)     Temp Source  11/18/19 1223 Oral     SpO2 11/18/19 1223 97 %     Weight 11/18/19 1225 183 lb (83 kg)     Height 11/18/19 1225 6\' 1"  (1.854 m)     Head Circumference --      Peak Flow --      Pain Score 11/18/19 1225 8     Pain Loc --      Pain Edu? --      Excl. in [place]? --    Constitutional: Patient is awake alert oriented.  Quite jaundiced in appearance. Eyes: Scleral icterus. ENT      Head: Normocephalic and atraumatic.      Mouth/Throat: Mucous membranes are moist. Cardiovascular: Normal rate, regular rhythm. Respiratory: Normal respiratory effort without tachypnea nor retractions. Breath sounds are clear Gastrointestinal: Soft and nontender.  Musculoskeletal: Nontender with normal range of motion in all extremities.  Neurologic:  Normal speech and language. No gross focal neurologic deficits Skin:  Skin is warm.  Jaundiced in appearance. Psychiatric: Mood and affect are normal.   ____________________________________________  EKG  EKG viewed and interpreted by myself shows sinus tachycardia 120 bpm with a slightly widened QRS, normal axis, slight QTC prolongation.  Nonspecific ST changes.  ____________________________________________   INITIAL IMPRESSION / ASSESSMENT AND PLAN / ED COURSE  Pertinent labs & imaging results that were available during my care of the patient were reviewed by me and considered in my medical decision making (see chart for details).   Patient presents to the emergency department with nausea dark vomitus since this morning.  Vomitus is strongly guaiac positive.  Patient is tachycardic around 120.  I spoke to GI medicine Dr. Norma Fredrickson.  I have started the patient on Protonix and octreotide.  We will dose IV fluids.  We will check labs and continue to closely monitor.  GI medicine recommends admission to the hospitalist service and they will likely proceed with endoscopy either today or tomorrow depending on lab work.  Lab work shows a hemoglobin of 8.3, largely  unchanged from baseline hemoglobin.  Patient's INR is up to 1.6.  Patient has not vomited any further since receiving nausea medication.  Currently on Protonix and octreotide infusion.  Patient admitted to the hospitalist service.  GI aware of patient.  Larry Andrade. was evaluated in Emergency Department on 11/18/2019 for the symptoms described in the history of present illness. He was evaluated in the context of the global COVID-19 pandemic, which necessitated consideration that the patient might be at risk for infection with the SARS-CoV-2 virus that causes COVID-19. Institutional protocols and algorithms that pertain to the evaluation of patients at risk for COVID-19 are in a state of rapid change based on information released by regulatory bodies including the CDC and federal and state organizations. These policies and algorithms were followed during the patient's care in the ED.  CRITICAL CARE Performed by: Minna Antis   Total critical care time: 30 minutes  Critical care time was exclusive of separately billable procedures and treating other patients.  Critical care was necessary to treat or prevent imminent or life-threatening deterioration.  Critical care was time spent personally by me on the following activities: development of treatment plan with patient and/or surrogate as well as nursing, discussions with consultants, evaluation of patient's response to treatment, examination of patient, obtaining history from patient or surrogate, ordering and performing treatments and interventions, ordering and review of laboratory studies, ordering and review of radiographic studies, pulse oximetry and re-evaluation of patient's condition.   ____________________________________________   FINAL CLINICAL IMPRESSION(S) / ED DIAGNOSES  Upper GI bleed   Minna Antis, MD 11/18/19 1413

## 2019-11-19 ENCOUNTER — Inpatient Hospital Stay: Payer: Medicare Other | Admitting: Anesthesiology

## 2019-11-19 ENCOUNTER — Encounter
Admission: EM | Disposition: A | Discharge status: Home Health | Payer: Self-pay | Source: Home / Self Care | Attending: Internal Medicine

## 2019-11-19 HISTORY — PX: ESOPHAGOGASTRODUODENOSCOPY: SHX5428

## 2019-11-19 LAB — CBC
HCT: 23.5 % — ABNORMAL LOW (ref 39.0–52.0)
HCT: 23.5 % — ABNORMAL LOW (ref 39.0–52.0)
HCT: 22 % — ABNORMAL LOW (ref 39.0–52.0)
HCT: 22.3 % — ABNORMAL LOW (ref 39.0–52.0)
Hemoglobin: 8.2 g/dL — ABNORMAL LOW (ref 13.0–17.0)
Hemoglobin: 8.2 g/dL — ABNORMAL LOW (ref 13.0–17.0)
Hemoglobin: 7.7 g/dL — ABNORMAL LOW (ref 13.0–17.0)
Hemoglobin: 7.7 g/dL — ABNORMAL LOW (ref 13.0–17.0)
MCH: 34.6 pg — ABNORMAL HIGH (ref 26.0–34.0)
MCH: 34.6 pg — ABNORMAL HIGH (ref 26.0–34.0)
MCH: 35 pg — ABNORMAL HIGH (ref 26.0–34.0)
MCH: 34.4 pg — ABNORMAL HIGH (ref 26.0–34.0)
MCHC: 34.9 g/dL (ref 30.0–36.0)
MCHC: 34.9 g/dL (ref 30.0–36.0)
MCHC: 35 g/dL (ref 30.0–36.0)
MCHC: 34.5 g/dL (ref 30.0–36.0)
MCV: 99.2 fL (ref 80.0–100.0)
MCV: 99.2 fL (ref 80.0–100.0)
MCV: 100 fL (ref 80.0–100.0)
MCV: 99.6 fL (ref 80.0–100.0)
Platelets: 94 10*3/uL — ABNORMAL LOW (ref 150–400)
Platelets: 94 10*3/uL — ABNORMAL LOW (ref 150–400)
Platelets: 95 10*3/uL — ABNORMAL LOW (ref 150–400)
Platelets: 95 10*3/uL — ABNORMAL LOW (ref 150–400)
RBC: 2.37 MIL/uL — ABNORMAL LOW (ref 4.22–5.81)
RBC: 2.37 MIL/uL — ABNORMAL LOW (ref 4.22–5.81)
RBC: 2.2 MIL/uL — ABNORMAL LOW (ref 4.22–5.81)
RBC: 2.24 MIL/uL — ABNORMAL LOW (ref 4.22–5.81)
RDW: 21.2 % — ABNORMAL HIGH (ref 11.5–15.5)
RDW: 22.3 % — ABNORMAL HIGH (ref 11.5–15.5)
RDW: 22.3 % — ABNORMAL HIGH (ref 11.5–15.5)
RDW: 22.2 % — ABNORMAL HIGH (ref 11.5–15.5)
WBC: 10.3 10*3/uL (ref 4.0–10.5)
WBC: 9.1 10*3/uL (ref 4.0–10.5)
WBC: 9.3 10*3/uL (ref 4.0–10.5)
WBC: 8 10*3/uL (ref 4.0–10.5)
nRBC: 0.2 % (ref 0.0–0.2)
nRBC: 0 % (ref 0.0–0.2)
nRBC: 0 % (ref 0.0–0.2)
nRBC: 0 % (ref 0.0–0.2)

## 2019-11-19 LAB — COMPREHENSIVE METABOLIC PANEL
ALT: 38 U/L (ref 0–44)
AST: 136 U/L — ABNORMAL HIGH (ref 15–41)
Albumin: 2.2 g/dL — ABNORMAL LOW (ref 3.5–5.0)
Alkaline Phosphatase: 184 U/L — ABNORMAL HIGH (ref 38–126)
Anion gap: 12 (ref 5–15)
BUN: 30 mg/dL — ABNORMAL HIGH (ref 6–20)
CO2: 31 mmol/L (ref 22–32)
Calcium: 7.2 mg/dL — ABNORMAL LOW (ref 8.9–10.3)
Chloride: 96 mmol/L — ABNORMAL LOW (ref 98–111)
Creatinine, Ser: 1.14 mg/dL (ref 0.61–1.24)
GFR calc Af Amer: >60 mL/min (ref >60)
GFR calc non Af Amer: >60 mL/min (ref >60)
Glucose, Bld: 128 mg/dL — ABNORMAL HIGH (ref 70–99)
Potassium: 3.9 mmol/L (ref 3.5–5.1)
Sodium: 139 mmol/L (ref 135–145)
Total Bilirubin: 13.9 mg/dL — ABNORMAL HIGH (ref 0.3–1.2)
Total Protein: 6.4 g/dL — ABNORMAL LOW (ref 6.5–8.1)

## 2019-11-19 LAB — GLUCOSE, CAPILLARY: Glucose-Capillary: 92 mg/dL (ref 70–99)

## 2019-11-19 SURGERY — EGD (ESOPHAGOGASTRODUODENOSCOPY)
Anesthesia: General

## 2019-11-19 MED ORDER — SUCCINYLCHOLINE CHLORIDE 20 MG/ML IJ SOLN
INTRAMUSCULAR | Status: DC | PRN
  Administered 2019-11-19: 100 mg via INTRAVENOUS

## 2019-11-19 MED ORDER — PROPOFOL 10 MG/ML IV BOLUS
INTRAVENOUS | Status: DC | PRN
  Administered 2019-11-19: 120 mg via INTRAVENOUS

## 2019-11-19 MED ORDER — ONDANSETRON HCL 4 MG/2ML IJ SOLN
INTRAMUSCULAR | Status: DC | PRN
  Administered 2019-11-19: 4 mg via INTRAVENOUS

## 2019-11-19 MED ORDER — LIDOCAINE HCL (CARDIAC) PF 100 MG/5ML IV SOSY
PREFILLED_SYRINGE | INTRAVENOUS | Status: DC | PRN
  Administered 2019-11-19: 80 mg via INTRAVENOUS

## 2019-11-19 MED ORDER — PROPOFOL 10 MG/ML IV BOLUS
INTRAVENOUS | Status: AC
  Filled 2019-11-19: qty 20

## 2019-11-19 MED ORDER — METHYLPREDNISOLONE 4 MG PO TABS
40.0000 mg | ORAL_TABLET | Freq: Every day | ORAL | Status: DC
  Administered 2019-11-19 – 2019-11-22 (×4): 40 mg via ORAL
  Filled 2019-11-19 (×6): qty 10

## 2019-11-19 MED ORDER — IPRATROPIUM-ALBUTEROL 0.5-2.5 (3) MG/3ML IN SOLN: 3.0000 mL | Freq: Once | RESPIRATORY_TRACT | Status: AC

## 2019-11-19 MED ORDER — IPRATROPIUM-ALBUTEROL 0.5-2.5 (3) MG/3ML IN SOLN
RESPIRATORY_TRACT | Status: AC
  Administered 2019-11-19: 3 mL via RESPIRATORY_TRACT
  Filled 2019-11-19: qty 3

## 2019-11-19 NOTE — Interval H&P Note (Signed)
History and Physical Interval Note:  11/19/2019 10:34 AM  [facility].  has presented today for surgery, with the diagnosis of Hematmesis, acute blood loss anemia, portal hypertensive gastropathy, alcoholic cirrhosis.  The various methods of treatment have been discussed with the patient and family. After consideration of risks, benefits and other options for treatment, the patient has consented to  Procedure(s): ESOPHAGOGASTRODUODENOSCOPY (EGD) (N/A) as a surgical intervention.  The patient's history has been reviewed, patient examined, no change in status, stable for surgery.  I have reviewed the patient's chart and labs.  Questions were answered to the patient's satisfaction.     [place], [place]

## 2019-11-19 NOTE — Progress Notes (Signed)
PROGRESS NOTE    Larry Andrade.  IRS:[id number] DOB: Apr 10, 1961 DOA: 11/18/2019 PCP: Larry Limerick, PA       Assessment & Plan:   Principal Problem:   Hematemesis Active Problems:   Acute blood loss anemia   Gastrointestinal hemorrhage   Alcoholic cirrhosis (HCC)   Alcohol abuse   Stage 3a chronic kidney disease   Thrombocytopenia (HCC)   Essential hypertension   AF (paroxysmal atrial fibrillation) (HCC)   Chronic diastolic CHF (congestive heart failure) (HCC)   Tobacco abuse   Hematemesis: w/ acute blood loss anemia: Hx of esophageal varices. EGD 11/19/19 shows grade A esophagitis, portal hypertensive gastropathy. Continue on IVFs. Continue on IV protonix & d/c octreotide as per GI. Zofran prn for nausea/vomiting. Avoid NSAIDs. S/p 2 units of pRBCs. H&H are trending up. Will continue to monitor H&H. GI following and recs apprec   Alcoholic cirrhosis: secondary to alcohol abuse. Alcohol cessation counseling. CIWA prn. MELD score of 23, 19.6% estimated 3 month mortality   Hyperbilirubinemia: secondary alcoholic cirrhosis. Significant jaundice   Macrocytic anemia: w/ acute blood loss anemia. Secondary to bone marrow suppression from alcohol abuse   Transaminitis: ALT is WNL, AST is elevated. Will continue to monitor   Tobacco abuse: nicotine patch to prevent w/drawal. Smoking cessation counseling   CKDIIIa: stable. Will continue to monitor   Thrombocytopenia:secondary to cirrhosis. Will continue to monitor   HTN: hold lasix and amlodipine since patient is at risk of developing hypotension due to GI bleeding  PAF: continue metoprolol and amiodarone  Chronic diastolic CHF:  echo on 0/02/3817 showed EF of 55 to 60%. Hold Lasix since patient at high risk of developing hypotension from GI bleeding  DVT prophylaxis: SCDs Code Status: full  Family Communication Disposition Plan: depends on PT/OT recs    Consultants:   GI  Procedures:   Antimicrobials:   Subjective: Pt c/o dry mouth   Objective: Vitals:   11/19/19 0400 11/19/19 0500 11/19/19 0600 11/19/19 0802  BP: 105/65 109/72 109/70 120/74  Pulse: (!) 102 100 (!) 101 (!) 103  Resp: (!) 21 17 18 18   Temp:      TempSrc:      SpO2: 96% 98% 96% 94%  Weight:      Height:        Intake/Output Summary (Last 24 hours) at 11/19/2019 0823 Last data filed at 11/19/2019 0800 Gross per 24 hour  Intake 2988.81 ml  Output 500 ml  Net 2488.81 ml   Filed Weights   11/18/19 1225  Weight: 83 kg    Examination:  General exam: Appears calm and comfortable. Jaundice & scleral icterus  Respiratory system: diminished breath sounds b/l Cardiovascular system: S1 & S2 +. No rubs, gallops or clicks.  Gastrointestinal system: Abdomen is distended, soft and nontender. Hyoactive bowel sounds heard. Central nervous system: Alert and oriented. Moves all 4 extremities  Psychiatry: Judgement and insight appear normal. Flat mood and affect     Data Reviewed: I have personally reviewed following labs and imaging studies  CBC: Recent Labs  Lab 11/18/19 1240 11/18/19 1416 11/18/19 2054 11/19/19 0204  WBC 10.9* 10.1 9.6 10.3  HGB 8.3* 6.6* 7.4* 8.2*  HCT 24.5* 19.5* 21.1* 23.5*  MCV 105.2* 106.6* 100.0 99.2  PLT 112* 93* 88* 94*   Basic Metabolic Panel: Recent Labs  Lab 11/18/19 1240 11/18/19 2054 11/19/19 0204  NA 139 140 139  K 3.6 4.0 3.9  CL 90* 95* 96*  CO2 25 27 31   GLUCOSE  117* 111* 128*  BUN 23* 28* 30*  CREATININE 1.28* 1.24 1.14  CALCIUM 8.2* 7.5* 7.2*   GFR: Estimated Creatinine Clearance: 81.8 mL/min (by C-G formula based on SCr of 1.14 mg/dL). Liver Function Tests: Recent Labs  Lab 11/18/19 1240 11/18/19 2054 11/19/19 0204  AST 158* 143* 136*  ALT 43 39 38  ALKPHOS 256* 197* 184*  BILITOT 14.3* 13.1* 13.9*  PROT 7.9 6.7 6.4*  ALBUMIN 2.5* 2.2* 2.2*   No results for input(s): LIPASE, AMYLASE in the last 168  hours. Recent Labs  Lab 11/18/19 1249  AMMONIA 85*   Coagulation Profile: Recent Labs  Lab 11/18/19 1240  INR 1.6*   Cardiac Enzymes: No results for input(s): CKTOTAL, CKMB, CKMBINDEX, TROPONINI in the last 168 hours. BNP (last 3 results) No results for input(s): PROBNP in the last 8760 hours. HbA1C: No results for input(s): HGBA1C in the last 72 hours. CBG: No results for input(s): GLUCAP in the last 168 hours. Lipid Profile: No results for input(s): CHOL, HDL, LDLCALC, TRIG, CHOLHDL, LDLDIRECT in the last 72 hours. Thyroid Function Tests: No results for input(s): TSH, T4TOTAL, FREET4, T3FREE, THYROIDAB in the last 72 hours. Anemia Panel: No results for input(s): VITAMINB12, FOLATE, FERRITIN, TIBC, IRON, RETICCTPCT in the last 72 hours. Sepsis Labs: No results for input(s): PROCALCITON, LATICACIDVEN in the last 168 hours.  Recent Results (from the past 240 hour(s))  SARS Coronavirus 2 by RT PCR (hospital order, performed in [facility] hospital lab) Nasopharyngeal Nasopharyngeal Swab     Status: None   Collection Time: 11/18/19  2:12 PM   Specimen: Nasopharyngeal Swab  Result Value Ref Range Status   SARS Coronavirus 2 NEGATIVE NEGATIVE Final    Comment: (NOTE) SARS-CoV-2 target nucleic acids are NOT DETECTED. The SARS-CoV-2 RNA is generally detectable in upper and lower respiratory specimens during the acute phase of infection. The lowest concentration of SARS-CoV-2 viral copies this assay can detect is 250 copies / mL. A negative result does not preclude SARS-CoV-2 infection and should not be used as the sole basis for treatment or other patient management decisions.  A negative result may occur with improper specimen collection / handling, submission of specimen other than nasopharyngeal swab, presence of viral mutation(s) within the areas targeted by this assay, and inadequate number of viral copies (<250 copies / mL). A negative result must be combined with  clinical observations, patient history, and epidemiological information. Fact Sheet for Patients:   BoilerBrush.com.cy Fact Sheet for Healthcare Providers: https://pope.com/ This test is not yet approved or cleared  by the [place] FDA and has been authorized for detection and/or diagnosis of SARS-CoV-2 by FDA under an Emergency Use Authorization (EUA).  This EUA will remain in effect (meaning this test can be used) for the duration of the COVID-19 declaration under Section 564(b)(1) of the Act, 21 U.S.C. section 360bbb-3(b)(1), unless the authorization is terminated or revoked sooner. Performed at [facility], [address] [place], [place] [address]   MRSA PCR Screening     Status: Abnormal   Collection Time: 11/18/19  9:01 PM   Specimen: Nasal Mucosa; Nasopharyngeal  Result Value Ref Range Status   MRSA by PCR POSITIVE (A) NEGATIVE Final    Comment:        The GeneXpert MRSA Assay (FDA approved for NASAL specimens only), is one component of a comprehensive MRSA colonization surveillance program. It is not intended to diagnose MRSA infection nor to guide or monitor treatment for MRSA infections. RESULT CALLED  TO, READ BACK BY AND VERIFIED WITH: Melburn Popper 11/18/19 AT 2242 HS Performed at [facility], [address] [place], [place] [address]          Radiology Studies: No results found.      Scheduled Meds: . sodium chloride   Intravenous Once  . allopurinol  100 mg Oral BID  . amiodarone  200 mg Oral Daily  . folic acid  1 mg Oral Daily  . LORazepam  0-4 mg Intravenous Q6H   Followed by  . [START ON 11/20/2019] LORazepam  0-4 mg Intravenous Q12H  . metoprolol succinate  25 mg Oral BID  . multivitamin with minerals  1 tablet Oral Daily  . mupirocin ointment  1 application Nasal BID  . nicotine  21 mg Transdermal Daily  . thiamine  100 mg Oral Daily   Or  . thiamine  100 mg  Intravenous Daily  . thiamine  100 mg Oral Daily  . vitamin B-12  500 mcg Oral Daily   Continuous Infusions: . sodium chloride Stopped (11/19/19 0757)  . octreotide  (SANDOSTATIN)    IV infusion 50 mcg/hr (11/19/19 0800)  . pantoprozole (PROTONIX) infusion 8 mg/hr (11/19/19 0800)  . sodium chloride       LOS: 1 day    Time spent: 32 mins     Charise Killian, MD [facility] Pager [phone]  If 7PM-7AM, please contact night-coverage [email or web address] 11/19/2019, 8:23 AM

## 2019-11-19 NOTE — Anesthesia Procedure Notes (Signed)
Procedure Name: Intubation Date/Time: 11/19/2019 12:18 PM Performed by: Debe Coder, CRNA Pre-anesthesia Checklist: Patient identified, Emergency Drugs available, Suction available and Patient being monitored Patient Re-evaluated:Patient Re-evaluated prior to induction Oxygen Delivery Method: Circle system utilized Preoxygenation: Pre-oxygenation with 100% oxygen Induction Type: IV induction and Rapid sequence Laryngoscope Size: Mac and 4 Grade View: Grade I Tube type: Oral Tube size: 7.0 mm Number of attempts: 1 Airway Equipment and Method: Stylet and Oral airway Placement Confirmation: ETT inserted through vocal cords under direct vision,  positive ETCO2 and breath sounds checked- equal and bilateral Secured at: 22 cm Tube secured with: Tape Dental Injury: Teeth and Oropharynx as per pre-operative assessment

## 2019-11-19 NOTE — Anesthesia Procedure Notes (Signed)
Performed by: Adia Crammer R, CRNA       

## 2019-11-19 NOTE — Anesthesia Preprocedure Evaluation (Signed)
Anesthesia Evaluation  Patient identified by MRN, date of birth, ID band Patient awake    Reviewed: Allergy & Precautions, NPO status , Patient's Chart, lab work & pertinent test results  History of Anesthesia Complications Negative for: history of anesthetic complications  Airway Mallampati: III  TM Distance: >3 FB Neck ROM: Full    Dental  (+) Poor Dentition, Chipped, Missing   Pulmonary COPD,  COPD inhaler, Current Smoker and Patient abstained from smoking.,    breath sounds clear to auscultation- rhonchi (-) wheezing      Cardiovascular hypertension, Pt. on medications +CHF  (-) CAD, (-) Past MI, (-) Cardiac Stents and (-) CABG  Rhythm:Regular Rate:Normal - Systolic murmurs and - Diastolic murmurs Echo 08/17/18: 1. The left ventricle has normal systolic function, with an ejection  fraction of 55-60%. The cavity size was normal. Left ventricular diastolic  parameters were normal No evidence of left ventricular regional wall  motion abnormalities.  2. The right ventricle has normal systolic function. The cavity was  normal. There is no increase in right ventricular wall thickness.  3. The mitral valve is normal in structure.  4. The tricuspid valve is normal in structure.  5. The aortic valve is normal in structure.  6. No evidence of left ventricular regional wall motion abnormalities.   Neuro/Psych neg Seizures PSYCHIATRIC DISORDERS (EtOH abuse) negative neurological ROS     GI/Hepatic GERD  ,(+) Cirrhosis   Esophageal Varices    ,   Endo/Other  negative endocrine ROSneg diabetes  Renal/GU negative Renal ROS     Musculoskeletal negative musculoskeletal ROS (+)   Abdominal (+) - obese,   Peds  Hematology  (+) anemia ,   Anesthesia Other Findings Past Medical History: No date: CHF (congestive heart failure) (HCC) No date: Cirrhosis (HCC) No date: GERD (gastroesophageal reflux disease) No date:  Gout No date: Hypertension No date: Pancreatitis   Reproductive/Obstetrics                             Anesthesia Physical Anesthesia Plan  ASA: III  Anesthesia Plan: General   Post-op Pain Management:    Induction: Intravenous  PONV Risk Score and Plan: 0  Airway Management Planned: Oral ETT  Additional Equipment:   Intra-op Plan:   Post-operative Plan: Extubation in OR  Informed Consent: I have reviewed the patients History and Physical, chart, labs and discussed the procedure including the risks, benefits and alternatives for the proposed anesthesia with the patient or authorized representative who has indicated his/her understanding and acceptance.     Dental advisory given  Plan Discussed with: CRNA and Anesthesiologist  Anesthesia Plan Comments:         Anesthesia Quick Evaluation

## 2019-11-19 NOTE — TOC Initial Note (Signed)
Transition of Care (TOC) - Initial/Assessment Note    Patient Details  Name: Larry Andrade. MRN: [id number] Date of Birth: 09-06-1960  Transition of Care [facility]) CM/SW Contact:    Magnus Ivan, LCSW Phone Number: 11/19/2019, 2:12 PM  Clinical Narrative:            CSW consulted for Substance Abuse resources.   CSW met with patient at bedside for Readmission Screen and to offer resources. Patient declined Substance Abuse resources at this time. Patient declined needing any other resources at this time.  Patient reported he lives at home with his wife and drives himself to appointments. [facility] [place], [place] and patient denied issues with obtaining medications. PCP is Dr. Mare Loan. Patient reported he has a walker, bed side commode, several knee braces, and a nebulizer at home. Patient reported he had [facility] in the past after a knee surgery. He could not recall which agency he used. Patient reported he was at [facility] about a year ago for rehab. Patient denied resource needs at this time.   CSW will continue to follow.   Expected Discharge Plan: Home/Self Care Barriers to Discharge: Continued Medical Work up   Patient Goals and CMS Choice Patient states their goals for this hospitalization and ongoing recovery are:: to return home where he is independent   Choice offered to / list presented to : Patient  Expected Discharge Plan and Services Expected Discharge Plan: Home/Self Care       Living arrangements for the past 2 months: Single Family Home                                      Prior Living Arrangements/Services Living arrangements for the past 2 months: Single Family Home Lives with:: Spouse Patient language and need for interpreter reviewed:: Yes Do you feel safe going back to the place where you live?: Yes      Need for Family Participation in Patient Care: Yes (Comment) Care giver support system in place?: Yes  (comment) Current home services: DME Criminal Activity/Legal Involvement Pertinent to Current Situation/Hospitalization: No - Comment as needed  Activities of Daily Living      Permission Sought/Granted   Permission granted to share information with : Yes, Verbal Permission Granted              Emotional Assessment Appearance:: Appears stated age Attitude/Demeanor/Rapport: Engaged Affect (typically observed): Calm Orientation: : Oriented to Self, Oriented to Place, Oriented to  Time, Oriented to Situation Alcohol / Substance Use: Alcohol Use Psych Involvement: No (comment)  Admission diagnosis:  Hematemesis [K92.0] Gastrointestinal hemorrhage, unspecified gastrointestinal hemorrhage type [K92.2] Patient Active Problem List   Diagnosis Date Noted  . Tobacco abuse 11/18/2019  . Hematemesis 11/18/2019  . Alcohol abuse   . Stage 3a chronic kidney disease   . Thrombocytopenia ([place])   . Essential hypertension   . AF (paroxysmal atrial fibrillation) ([place])   . Chronic diastolic CHF (congestive heart failure) ([place])   . Alcoholic cirrhosis ([place])   . Gastrointestinal hemorrhage 10/06/2019  . COPD exacerbation ([place]) 10/06/2019  . Symptomatic anemia 04/06/2019  . Malnutrition of moderate degree 09/04/2018  . Sepsis ([place]) 08/26/2018  . Acute on chronic systolic CHF (congestive heart failure) ([place]) 08/17/2018  . Lactic acid acidosis 10/25/2017  . Generalized abdominal pain   . Hematemesis with nausea   . Acute blood loss anemia   .  Gout attack 02/22/2017   PCP:  Rutherford Limerick, PA [facility]   [id number] [place] [id number]: [phone]: [phone]    Social Determinants of Health (SDOH) Interventions    Readmission Risk Interventions Readmission Risk Prevention Plan 11/19/2019 10/08/2019 04/09/2019  Transportation Screening Complete Complete Complete  PCP or  Specialist Appt within 3-5 Days Complete Complete Complete  HRI or Home Care Consult Complete Complete Complete  Social Work Consult for RAmagonPlanning/Counseling - Patient refused Patient refused  Palliative Care Screening Complete Not Applicable Not Applicable  Medication Review (RN Care Manager) Complete Complete Referral to Pharmacy  Some recent data might be hidden

## 2019-11-19 NOTE — Op Note (Signed)
[facility] Patient Name: Larry Andrade Procedure Date: 11/19/2019 12:07 PM MRN: [id number] Account #: [id number] Date of Birth: [birth year] Admit Type: Inpatient Age: 59 Room: [facility] ENDO ROOM 3 Gender: Male Note Status: Finalized Procedure:             Upper GI endoscopy Indications:           Acute post hemorrhagic anemia, Hematemesis Providers:             Boykin Nearing. Jennetta Flood MD, MD Medicines:             Propofol per Anesthesia Complications:         No immediate complications. Estimated blood loss: None. Procedure:             Pre-Anesthesia Assessment:                        - The risks and benefits of the procedure and the                         sedation options and risks were discussed with the                         patient. All questions were answered and informed                         consent was obtained.                        - Patient identification and proposed procedure were                         verified prior to the procedure by the nurse. The                         procedure was verified in the procedure room.                        - ASA Grade Assessment: III - A patient with severe                         systemic disease.                        - After reviewing the risks and benefits, the patient                         was deemed in satisfactory condition to undergo the                         procedure.                        After obtaining informed consent, the endoscope was                         passed under direct vision. Throughout the procedure,                         the patient's blood pressure, pulse, and oxygen  saturations were monitored continuously. The Endoscope                         was introduced through the mouth, and advanced to the                         third part of duodenum. The upper GI endoscopy was                         accomplished without difficulty. The patient  tolerated                         the procedure well. Findings:      LA Grade A (one or more mucosal breaks less than 5 mm, not extending       between tops of 2 mucosal folds) esophagitis with no bleeding was found       in the distal esophagus.      There is no endoscopic evidence of stricture, ulcerations, varices or       mass in the lower third of the esophagus.      Moderate portal hypertensive gastropathy was found in the entire       examined stomach.      There is no endoscopic evidence of bleeding, ulceration or varices in       the entire examined stomach.      The examined duodenum was normal.      The exam was otherwise without abnormality. Impression:            - LA Grade A reflux esophagitis with no bleeding.                        - Portal hypertensive gastropathy.                        - Normal examined duodenum.                        - The examination was otherwise normal.                        - No specimens collected. Recommendation:        - Return patient to ICU for ongoing care.                        - Patient is stable from a GI bleeding standpoint to                         be transferred to a regular medical bed.                        - Low sodium diet.                        - Stop Octreotide.                        Treat alcoholic hepatitis with steroids as planned.                        continue CIWA protocol. Procedure Code(s):     ---  Professional ---                        [phone], Esophagogastroduodenoscopy, flexible,                         transoral; diagnostic, including collection of                         specimen(s) by brushing or washing, when performed                         (separate procedure) Diagnosis Code(s):     --- Professional ---                        K92.0, Hematemesis                        D62, Acute posthemorrhagic anemia                        K31.89, Other diseases of stomach and duodenum                        K76.6,  Portal hypertension                        K21.00, Gastro-esophageal reflux disease with                         esophagitis, without bleeding CPT copyright 2019 American Medical Association. All rights reserved. The codes documented in this report are preliminary and upon coder review may  be revised to meet current compliance requirements. Efrain Sella MD, MD 11/19/2019 12:31:00 PM This report has been signed electronically. Number of Addenda: 0 Note Initiated On: 11/19/2019 12:07 PM Estimated Blood Loss:  Estimated blood loss: none.      [facility]

## 2019-11-19 NOTE — Transfer of Care (Signed)
Immediate Anesthesia Transfer of Care Note  Patient: Larry Andrade.  Procedure(s) Performed: ESOPHAGOGASTRODUODENOSCOPY (EGD) (N/A )  Patient Location: PACU  Anesthesia Type:General  Level of Consciousness: drowsy  Airway & Oxygen Therapy: Patient Spontanous Breathing and Patient connected to nasal cannula oxygen  Post-op Assessment: Report given to RN and Post -op Vital signs reviewed and stable  Post vital signs: Reviewed and stable  Last Vitals:  Vitals Value Taken Time  BP 112/71 11/19/19 1248  Temp 36.7 C 11/19/19 1248  Pulse 99 11/19/19 1252  Resp 20 11/19/19 1252  SpO2 94 % 11/19/19 1252    Last Pain:  Vitals:   11/19/19 1248  TempSrc:   PainSc: 0-No pain         Complications: No apparent anesthesia complications

## 2019-11-20 ENCOUNTER — Encounter: Payer: Self-pay | Admitting: *Deleted

## 2019-11-20 LAB — COMPREHENSIVE METABOLIC PANEL
ALT: 37 U/L (ref 0–44)
AST: 141 U/L — ABNORMAL HIGH (ref 15–41)
Albumin: 2 g/dL — ABNORMAL LOW (ref 3.5–5.0)
Alkaline Phosphatase: 175 U/L — ABNORMAL HIGH (ref 38–126)
Anion gap: 9 (ref 5–15)
BUN: 33 mg/dL — ABNORMAL HIGH (ref 6–20)
CO2: 31 mmol/L (ref 22–32)
Calcium: 7.3 mg/dL — ABNORMAL LOW (ref 8.9–10.3)
Chloride: 98 mmol/L (ref 98–111)
Creatinine, Ser: 1.31 mg/dL — ABNORMAL HIGH (ref 0.61–1.24)
GFR calc Af Amer: >60 mL/min (ref >60)
GFR calc non Af Amer: >60 mL/min (ref >60)
Glucose, Bld: 180 mg/dL — ABNORMAL HIGH (ref 70–99)
Potassium: 4.5 mmol/L (ref 3.5–5.1)
Sodium: 138 mmol/L (ref 135–145)
Total Bilirubin: 12.4 mg/dL — ABNORMAL HIGH (ref 0.3–1.2)
Total Protein: 6.4 g/dL — ABNORMAL LOW (ref 6.5–8.1)

## 2019-11-20 LAB — CBC
HCT: 21.8 % — ABNORMAL LOW (ref 39.0–52.0)
HCT: 23 % — ABNORMAL LOW (ref 39.0–52.0)
Hemoglobin: 7.6 g/dL — ABNORMAL LOW (ref 13.0–17.0)
Hemoglobin: 8 g/dL — ABNORMAL LOW (ref 13.0–17.0)
MCH: 35.2 pg — ABNORMAL HIGH (ref 26.0–34.0)
MCH: 34.9 pg — ABNORMAL HIGH (ref 26.0–34.0)
MCHC: 34.9 g/dL (ref 30.0–36.0)
MCHC: 34.8 g/dL (ref 30.0–36.0)
MCV: 100.9 fL — ABNORMAL HIGH (ref 80.0–100.0)
MCV: 100.4 fL — ABNORMAL HIGH (ref 80.0–100.0)
Platelets: 95 10*3/uL — ABNORMAL LOW (ref 150–400)
Platelets: 93 10*3/uL — ABNORMAL LOW (ref 150–400)
RBC: 2.16 MIL/uL — ABNORMAL LOW (ref 4.22–5.81)
RBC: 2.29 MIL/uL — ABNORMAL LOW (ref 4.22–5.81)
RDW: 21.6 % — ABNORMAL HIGH (ref 11.5–15.5)
RDW: 21.6 % — ABNORMAL HIGH (ref 11.5–15.5)
WBC: 7.3 10*3/uL (ref 4.0–10.5)
WBC: 7.3 10*3/uL (ref 4.0–10.5)
nRBC: 0 % (ref 0.0–0.2)
nRBC: 0 % (ref 0.0–0.2)

## 2019-11-20 LAB — HEPATITIS PANEL, ACUTE
HCV Ab: NONREACTIVE
Hep A IgM: NONREACTIVE
Hep B C IgM: NONREACTIVE
Hepatitis B Surface Ag: NONREACTIVE

## 2019-11-20 NOTE — Anesthesia Postprocedure Evaluation (Signed)
Anesthesia Post Note  Patient: Larry Andrade.  Procedure(s) Performed: ESOPHAGOGASTRODUODENOSCOPY (EGD) (N/A )  Patient location during evaluation: PACU Anesthesia Type: General Level of consciousness: awake and alert and oriented Pain management: pain level controlled Vital Signs Assessment: post-procedure vital signs reviewed and stable Respiratory status: spontaneous breathing, nonlabored ventilation and respiratory function stable Cardiovascular status: blood pressure returned to baseline and stable Postop Assessment: no signs of nausea or vomiting Anesthetic complications: no     Last Vitals:  Vitals:   11/20/19 0400 11/20/19 0500  BP: 90/75 101/73  Pulse: 87 81  Resp: (!) 24 18  Temp:  36.8 C  SpO2: (!) 84% 91%    Last Pain:  Vitals:   11/20/19 0500  TempSrc: Oral  PainSc:                  Camay Pedigo

## 2019-11-20 NOTE — Progress Notes (Signed)
Patient transferred from ICU to 1C. Patient with no complaints. VS as recorded. Order received from Dr Mayford Knife to d/c tele.

## 2019-11-20 NOTE — Progress Notes (Signed)
PROGRESS NOTE    Brown Human.  KKX:[id number] DOB: 1960/08/25 DOA: 11/18/2019 PCP: Gildardo Pounds, PA       Assessment & Plan:   Principal Problem:   Hematemesis Active Problems:   Acute blood loss anemia   Gastrointestinal hemorrhage   Alcoholic cirrhosis (HCC)   Alcohol abuse   Stage 3a chronic kidney disease   Thrombocytopenia (HCC)   Essential hypertension   AF (paroxysmal atrial fibrillation) (HCC)   Chronic diastolic CHF (congestive heart failure) (HCC)   Tobacco abuse   Hematemesis: w/ acute blood loss anemia: Hx of esophageal varices. EGD 11/19/19 shows grade A esophagitis, portal hypertensive gastropathy. D/c IVFs. Continue on IV protonix as per GI. Zofran prn for nausea/vomiting. Avoid NSAIDs. S/p 2 units of pRBCs. H&H are stable. Will continue to monitor H&H. GI following and recs apprec   Alcoholic cirrhosis: secondary to alcohol abuse. Alcohol cessation counseling. CIWA prn. MELD score of 23, 19.6% estimated 3 month mortality   Hyperbilirubinemia: secondary alcoholic cirrhosis. Significant jaundice. Hepatitis panel pending   Macrocytic anemia: w/ acute blood loss anemia. Secondary to bone marrow suppression from alcohol abuse   Transaminitis: ALT is WNL, AST is elevated. Will continue to monitor   Tobacco abuse: nicotine patch to prevent w/drawal. Smoking cessation counseling   CKDIIIa: Cr is labile. Will continue to monitor   Thrombocytopenia:secondary to cirrhosis. Will continue to monitor   HTN: continue on metoprolol. Hold lasix and amlodipine as BP low normal and re-evaluate in AM   PAF: continue metoprolol and amiodarone  Chronic diastolic CHF:  echo on 08/17/2018 showed EF of 55 to 60%. Hold lasix as BP is low normal and reassess in AM   DVT prophylaxis: SCDs secondary bicytopenia Code Status: full  Family Communication Disposition Plan: depends on PT/OT recs    Consultants:   GI  Procedures:   Antimicrobials:   Subjective: Pt c/o malaise   Objective: Vitals:   11/20/19 0300 11/20/19 0400 11/20/19 0500 11/20/19 0800  BP: 103/74 90/75 101/73 99/69  Pulse: 87 87 81   Resp: 19 (!) 24 18 20   Temp:   98.2 F (36.8 C) 97.8 F (36.6 C)  TempSrc:   Oral Oral  SpO2: (!) 85% (!) 84% 91%   Weight:      Height:        Intake/Output Summary (Last 24 hours) at 11/20/2019 0827 Last data filed at 11/20/2019 0400 Gross per 24 hour  Intake 904.27 ml  Output 850 ml  Net 54.27 ml   Filed Weights   11/18/19 1225  Weight: 83 kg    Examination:  General exam: Appears calm and comfortable. Jaundice & scleral icterus  Respiratory system: decreased breath sounds b/l. No wheezes Cardiovascular system: S1 & S2 +. No rubs, gallops or clicks.  Gastrointestinal system: Abdomen is distended, soft and nontender. Normal bowel sounds heard. Central nervous system: Alert and oriented. Moves all 4 extremities  Psychiatry: Judgement and insight appear normal. Flat mood and affect     Data Reviewed: I have personally reviewed following labs and imaging studies  CBC: Recent Labs  Lab 11/19/19 0204 11/19/19 0952 11/19/19 1409 11/19/19 2024 11/20/19 0215  WBC 10.3 9.1 9.3 8.0 7.3  HGB 8.2* 8.2* 7.7* 7.7* 7.6*  HCT 23.5* 23.5* 22.0* 22.3* 21.8*  MCV 99.2 99.2 100.0 99.6 100.9*  PLT 94* 94* 95* 95* 95*   Basic Metabolic Panel: Recent Labs  Lab 11/18/19 1240 11/18/19 2054 11/19/19 0204 11/20/19 0215  NA 139 140 139 138  K 3.6 4.0 3.9 4.5  CL 90* 95* 96* 98  CO2 25 27 31 31   GLUCOSE 117* 111* 128* 180*  BUN 23* 28* 30* 33*  CREATININE 1.28* 1.24 1.14 1.31*  CALCIUM 8.2* 7.5* 7.2* 7.3*   GFR: Estimated Creatinine Clearance: 71.2 mL/min (A) (by C-G formula based on SCr of 1.31 mg/dL (H)). Liver Function Tests: Recent Labs  Lab 11/18/19 1240 11/18/19 2054 11/19/19 0204 11/20/19 0215  AST 158* 143* 136* 141*  ALT 43 39 38 37  ALKPHOS 256* 197*  184* 175*  BILITOT 14.3* 13.1* 13.9* 12.4*  PROT 7.9 6.7 6.4* 6.4*  ALBUMIN 2.5* 2.2* 2.2* 2.0*   No results for input(s): LIPASE, AMYLASE in the last 168 hours. Recent Labs  Lab 11/18/19 1249  AMMONIA 85*   Coagulation Profile: Recent Labs  Lab 11/18/19 1240  INR 1.6*   Cardiac Enzymes: No results for input(s): CKTOTAL, CKMB, CKMBINDEX, TROPONINI in the last 168 hours. BNP (last 3 results) No results for input(s): PROBNP in the last 8760 hours. HbA1C: No results for input(s): HGBA1C in the last 72 hours. CBG: Recent Labs  Lab 11/18/19 1854  GLUCAP 92   Lipid Profile: No results for input(s): CHOL, HDL, LDLCALC, TRIG, CHOLHDL, LDLDIRECT in the last 72 hours. Thyroid Function Tests: No results for input(s): TSH, T4TOTAL, FREET4, T3FREE, THYROIDAB in the last 72 hours. Anemia Panel: No results for input(s): VITAMINB12, FOLATE, FERRITIN, TIBC, IRON, RETICCTPCT in the last 72 hours. Sepsis Labs: No results for input(s): PROCALCITON, LATICACIDVEN in the last 168 hours.  Recent Results (from the past 240 hour(s))  SARS Coronavirus 2 by RT PCR (hospital order, performed in [facility] hospital lab) Nasopharyngeal Nasopharyngeal Swab     Status: None   Collection Time: 11/18/19  2:12 PM   Specimen: Nasopharyngeal Swab  Result Value Ref Range Status   SARS Coronavirus 2 NEGATIVE NEGATIVE Final    Comment: (NOTE) SARS-CoV-2 target nucleic acids are NOT DETECTED. The SARS-CoV-2 RNA is generally detectable in upper and lower respiratory specimens during the acute phase of infection. The lowest concentration of SARS-CoV-2 viral copies this assay can detect is 250 copies / mL. A negative result does not preclude SARS-CoV-2 infection and should not be used as the sole basis for treatment or other patient management decisions.  A negative result may occur with improper specimen collection / handling, submission of specimen other than nasopharyngeal swab, presence of viral  mutation(s) within the areas targeted by this assay, and inadequate number of viral copies (<250 copies / mL). A negative result must be combined with clinical observations, patient history, and epidemiological information. Fact Sheet for Patients:   StrictlyIdeas.no Fact Sheet for Healthcare Providers: BankingDealers.co.za This test is not yet approved or cleared  by the [place] FDA and has been authorized for detection and/or diagnosis of SARS-CoV-2 by FDA under an Emergency Use Authorization (EUA).  This EUA will remain in effect (meaning this test can be used) for the duration of the COVID-19 declaration under Section 564(b)(1) of the Act, 21 U.S.C. section 360bbb-3(b)(1), unless the authorization is terminated or revoked sooner. Performed at [facility], [address] [place], [place] [address]   MRSA PCR Screening     Status: Abnormal   Collection Time: 11/18/19  9:01 PM   Specimen: Nasal Mucosa; Nasopharyngeal  Result Value Ref Range Status   MRSA by PCR POSITIVE (A) NEGATIVE Final    Comment:        The GeneXpert MRSA Assay (FDA  approved for NASAL specimens only), is one component of a comprehensive MRSA colonization surveillance program. It is not intended to diagnose MRSA infection nor to guide or monitor treatment for MRSA infections. RESULT CALLED TO, READ BACK BY AND VERIFIED WITH: Melburn Popper 11/18/19 AT 2242 HS Performed at [facility], [address] [place], [place] [address]          Radiology Studies: No results found.      Scheduled Meds: . sodium chloride   Intravenous Once  . allopurinol  100 mg Oral BID  . amiodarone  200 mg Oral Daily  . folic acid  1 mg Oral Daily  . LORazepam  0-4 mg Intravenous Q6H   Followed by  . LORazepam  0-4 mg Intravenous Q12H  . methylPREDNISolone  40 mg Oral Daily  . metoprolol succinate  25 mg Oral BID  . multivitamin with  minerals  1 tablet Oral Daily  . mupirocin ointment  1 application Nasal BID  . nicotine  21 mg Transdermal Daily  . thiamine  100 mg Oral Daily   Or  . thiamine  100 mg Intravenous Daily  . vitamin B-12  500 mcg Oral Daily   Continuous Infusions: . sodium chloride 75 mL/hr at 11/20/19 0000  . pantoprozole (PROTONIX) infusion 8 mg/hr (11/20/19 0812)  . sodium chloride       LOS: 2 days    Time spent: 34 mins     Charise Killian, MD [facility] Pager [phone]  If 7PM-7AM, please contact night-coverage [email or web address] 11/20/2019, 8:27 AM

## 2019-11-21 DIAGNOSIS — Z720 Tobacco use: Secondary | ICD-10-CM

## 2019-11-21 LAB — PREPARE RBC (CROSSMATCH)

## 2019-11-21 LAB — COMPREHENSIVE METABOLIC PANEL
ALT: 40 U/L (ref 0–44)
AST: 125 U/L — ABNORMAL HIGH (ref 15–41)
Albumin: 2 g/dL — ABNORMAL LOW (ref 3.5–5.0)
Alkaline Phosphatase: 167 U/L — ABNORMAL HIGH (ref 38–126)
Anion gap: 9 (ref 5–15)
BUN: 35 mg/dL — ABNORMAL HIGH (ref 6–20)
CO2: 29 mmol/L (ref 22–32)
Calcium: 7.7 mg/dL — ABNORMAL LOW (ref 8.9–10.3)
Chloride: 100 mmol/L (ref 98–111)
Creatinine, Ser: 1.41 mg/dL — ABNORMAL HIGH (ref 0.61–1.24)
GFR calc Af Amer: >60 mL/min (ref >60)
GFR calc non Af Amer: 55 mL/min — ABNORMAL LOW (ref >60)
Glucose, Bld: 147 mg/dL — ABNORMAL HIGH (ref 70–99)
Potassium: 4.1 mmol/L (ref 3.5–5.1)
Sodium: 138 mmol/L (ref 135–145)
Total Bilirubin: 9.7 mg/dL — ABNORMAL HIGH (ref 0.3–1.2)
Total Protein: 6.5 g/dL (ref 6.5–8.1)

## 2019-11-21 LAB — CBC
HCT: 22.9 % — ABNORMAL LOW (ref 39.0–52.0)
Hemoglobin: 8 g/dL — ABNORMAL LOW (ref 13.0–17.0)
MCH: 34.3 pg — ABNORMAL HIGH (ref 26.0–34.0)
MCHC: 34.9 g/dL (ref 30.0–36.0)
MCV: 98.3 fL (ref 80.0–100.0)
Platelets: 103 10*3/uL — ABNORMAL LOW (ref 150–400)
RBC: 2.33 MIL/uL — ABNORMAL LOW (ref 4.22–5.81)
RDW: 21.8 % — ABNORMAL HIGH (ref 11.5–15.5)
WBC: 10.2 10*3/uL (ref 4.0–10.5)
nRBC: 0 % (ref 0.0–0.2)

## 2019-11-21 LAB — TYPE AND SCREEN
ABO/RH(D): AB POS
Antibody Screen: NEGATIVE
Unit division: 0
Unit division: 0
Unit division: 0
Unit division: 0
Unit division: 0
Unit division: 0

## 2019-11-21 LAB — BPAM RBC
Blood Product Expiration Date: 202105292359
Blood Product Expiration Date: 202106032359
Blood Product Expiration Date: 202106042359
Blood Product Expiration Date: 202106042359
Blood Product Expiration Date: 202106072359
Blood Product Expiration Date: 202106072359
ISSUE DATE / TIME: 202105231741
ISSUE DATE / TIME: 202105232115
ISSUE DATE / TIME: 202105241812
Unit Type and Rh: 600
Unit Type and Rh: 600
Unit Type and Rh: 600
Unit Type and Rh: 6200
Unit Type and Rh: 6200
Unit Type and Rh: 6200

## 2019-11-21 LAB — GLUCOSE, CAPILLARY: Glucose-Capillary: 128 mg/dL — ABNORMAL HIGH (ref 70–99)

## 2019-11-21 MED ORDER — OXYCODONE HCL 5 MG PO TABS
5.0000 mg | ORAL_TABLET | ORAL | Status: DC | PRN
  Administered 2019-11-21 – 2019-11-22 (×2): 5 mg via ORAL
  Filled 2019-11-21 (×2): qty 1

## 2019-11-21 MED ORDER — ALUM & MAG HYDROXIDE-SIMETH 200-200-20 MG/5ML PO SUSP
30.0000 mL | ORAL | Status: DC | PRN
  Administered 2019-11-21: 30 mL via ORAL
  Filled 2019-11-21: qty 30

## 2019-11-21 MED ORDER — PANTOPRAZOLE SODIUM 40 MG PO TBEC
40.0000 mg | DELAYED_RELEASE_TABLET | Freq: Every day | ORAL | Status: DC
  Administered 2019-11-21 – 2019-11-22 (×2): 40 mg via ORAL
  Filled 2019-11-21 (×2): qty 1

## 2019-11-21 NOTE — Care Management Important Message (Signed)
Important Message  Patient Details  Name: Larry Andrade. MRN: [id number] Date of Birth: 25-Jun-1961   Medicare Important Message Given:  Yes     Johnell Comings 11/21/2019, 11:54 AM

## 2019-11-21 NOTE — Plan of Care (Signed)
  Problem: Education: Goal: Ability to identify signs and symptoms of gastrointestinal bleeding will improve Outcome: Progressing   Problem: Bowel/Gastric: Goal: Will show no signs and symptoms of gastrointestinal bleeding Outcome: Progressing   Problem: Fluid Volume: Goal: Will show no signs and symptoms of excessive bleeding Outcome: Progressing   Problem: Clinical Measurements: Goal: Complications related to the disease process, condition or treatment will be avoided or minimized Outcome: Progressing   Problem: Education: Goal: Knowledge of General Education information will improve Description: Including pain rating scale, medication(s)/side effects and non-pharmacologic comfort measures Outcome: Progressing   Problem: Clinical Measurements: Goal: Ability to maintain clinical measurements within normal limits will improve Outcome: Progressing Goal: Will remain free from infection Outcome: Progressing Goal: Diagnostic test results will improve Outcome: Progressing   Problem: Nutrition: Goal: Adequate nutrition will be maintained Outcome: Progressing   Problem: Pain Managment: Goal: General experience of comfort will improve Outcome: Progressing   Problem: Safety: Goal: Ability to remain free from injury will improve Outcome: Progressing   Problem: Skin Integrity: Goal: Risk for impaired skin integrity will decrease Outcome: Progressing

## 2019-11-21 NOTE — Progress Notes (Addendum)
GI Inpatient Follow-up Note  Subjective:  Patient seen in follow-up for alcoholic hepatitis. EGD procedure performed 05/24 showed LA Grade A reflux esophagitis with no stigmata of bleeding, portal hypertensive gastropathy, normal examined duodenum. Total bilirubin 9.7 today compared to 12.4 yesterday. He is tolerating low-sodium diet without any difficulty. He denies any acute overnight events. He feels close to baseline. No new complaints. No further GI bleeding episodes.    Scheduled Inpatient Medications:  . sodium chloride   Intravenous Once  . allopurinol  100 mg Oral BID  . amiodarone  200 mg Oral Daily  . folic acid  1 mg Oral Daily  . LORazepam  0-4 mg Intravenous Q12H  . methylPREDNISolone  40 mg Oral Daily  . metoprolol succinate  25 mg Oral BID  . multivitamin with minerals  1 tablet Oral Daily  . mupirocin ointment  1 application Nasal BID  . nicotine  21 mg Transdermal Daily  . thiamine  100 mg Oral Daily   Or  . thiamine  100 mg Intravenous Daily  . vitamin B-12  500 mcg Oral Daily    Continuous Inpatient Infusions:   . sodium chloride      PRN Inpatient Medications:  albuterol, colchicine, hydrOXYzine, hydrOXYzine, LORazepam **OR** LORazepam  Review of Systems: Constitutional: Weight is stable.  Eyes: No changes in vision. ENT: No oral lesions, sore throat.  GI: see HPI.  Heme/Lymph: No easy bruising.  CV: No chest pain.  GU: No hematuria.  Integumentary: No rashes.  Neuro: No headaches.  Psych: No depression/anxiety.  Endocrine: No heat/cold intolerance.  Allergic/Immunologic: No urticaria.  Resp: No cough, SOB.  Musculoskeletal: No joint swelling.    Physical Examination: BP 115/79 (BP Location: Left Arm)   Pulse 81   Temp 97.6 F (36.4 C) (Oral)   Resp 20   Ht 6\' 1"  (1.854 m)   Wt 83 kg   SpO2 91%   BMI 24.14 kg/m  Gen: NAD, alert and oriented x 4 HEENT: PEERLA, EOMI, +jaundiced Neck: supple, no JVD or thyromegaly Chest: CTA bilaterally,  no wheezes, crackles, or other adventitious sounds CV: RRR, no m/g/c/r Abd: soft, NT, ND, +BS in all four quadrants; no HSM, guarding, ridigity, or rebound tenderness Ext: no edema, well perfused with 2+ pulses, Skin: no rash or lesions noted Lymph: no LAD  Data: Lab Results  Component Value Date   WBC 10.2 11/21/2019   HGB 8.0 (L) 11/21/2019   HCT 22.9 (L) 11/21/2019   MCV 98.3 11/21/2019   PLT 103 (L) 11/21/2019   Recent Labs  Lab 11/20/19 0215 11/20/19 0809 11/21/19 0402  HGB 7.6* 8.0* 8.0*   Lab Results  Component Value Date   NA 138 11/21/2019   K 4.1 11/21/2019   CL 100 11/21/2019   CO2 29 11/21/2019   BUN 35 (H) 11/21/2019   CREATININE 1.41 (H) 11/21/2019   Lab Results  Component Value Date   ALT 40 11/21/2019   AST 125 (H) 11/21/2019   ALKPHOS 167 (H) 11/21/2019   BILITOT 9.7 (H) 11/21/2019   Recent Labs  Lab 11/18/19 1240 11/18/19 1416  APTT  --  41*  INR 1.6*  --    Assessment/Plan:  59 y/o Caucasian male with a PMH of alcoholic cirrhosis, congestive heart failure, PAF on amiodarone, GERD, HTN, gout, alcohol abuse presented to the ED for complaints of nausea and coffee-ground emesis  1. Upper GI bleeding 2/2 hematemesis with acute blood loss anemia - EGD 05/24 showed LA Grade A  reflux esophagitis, PHG, no active bleeding seen  2. Alcoholic cirrhosis - MELD 24, Child's Class C (11 pts), with thrombocytopenia and evidence of coagulopathy (INR 1.6)  3. Alcoholic hepatitis - Maddrey DF 34.5, on oral steroids  4. Chronic diastolic CHF - EF 44/0102 55-60%, no acute CHF exacerbation  5. CKD Stage III     Recommendations:  1. Improving on oral prednisolone 40 mg daily. He should continue this for 28 days with close outpatient GI follow-up with our office 2. No evidence of ongoing overt GI bleeding 3. Tolerating low-sodium diet and having multiple soft BMs daily 4. Discussed importance of complete alcoholic cessation and poor prognosis if he  continues to drink alcohol in setting of alcohol abuse 5. Stable for discharge from hospital today with close outpatient follow-up in our clinic in 1-2 weeks   Please call with questions or concerns.    Octavia Bruckner, PA-C [facility] [phone] (Cell)

## 2019-11-21 NOTE — Evaluation (Signed)
Physical Therapy Evaluation Patient Details Name: Larry Andrade. MRN: [id number] DOB: Jan 06, 1961 Today's Date: 11/21/2019   History of Present Illness  Larry Andrade is a 89yoM who [facility] on 5/23 Andrade hematemesis, Hb down to 6.6. PMH: ETOH abuse, cirrhosis, esophageal varices, HTN, COPD, GERD, gout, pancreatitis, AF, CKD3, thrombocytopenia, tobacco use.  Clinical Impression  Pt admitted with above diagnosis. Pt currently with functional limitations due to the deficits listed below (see "PT Problem List"). Upon entry, pt in bed, awake and agreeable to participate. The pt is alert and oriented x4, pleasant, conversational, and generally a good historian. All mobility performed at supervision level or higher, RW used for AMB about the unit as per baseline. Functional mobility assessment demonstrates increased effort/time requirements, fair tolerance, but no need for physical assistance, whereas the patient performed these at a higher level of independence PTA. Pt tolerating less ambulatory activity than his baseline. No apparent falls or balance issues appreciated. Pt has good safety awareness with RW in mobility. Pt will benefit from skilled PT intervention to increase independence and safety with basic mobility in preparation for discharge to the venue listed below.       Follow Up Recommendations Home health PT    Equipment Recommendations  None recommended by PT    Recommendations for Other Services       Precautions / Restrictions Precautions Precautions: Fall Restrictions Weight Bearing Restrictions: No      Mobility  Bed Mobility Overal bed mobility: Modified Independent                Transfers Overall transfer level: Modified independent Equipment used: Rolling walker (2 wheeled);None                Ambulation/Gait Ambulation/Gait assistance: Supervision Gait Distance (Feet): 190 Feet Assistive device: Rolling walker (2 wheeled) Gait Pattern/deviations:  WFL(Within Functional Limits) Gait velocity: 0.49m/s   General Gait Details: appears safe and steady  Stairs            Wheelchair Mobility    Modified Rankin (Stroke Patients Only)       Balance Overall balance assessment: Modified Independent;No apparent balance deficits (not formally assessed)                                           Pertinent Vitals/Pain      Home Living Family/patient expects to be discharged to:: Private residence Living Arrangements: Spouse/significant other(Girlfriend) Available Help at Discharge: Available 24 hours/day Type of Home: House Home Access: Stairs to enter   [employer] of Steps: 3 non-consecutive hal-steps Home Layout: One level Home Equipment: Walker - 2 wheels;Crutches;Bedside commode      Prior Function Level of Independence: Independent with assistive device(s);Needs assistance   Gait / Transfers Assistance Needed: mostlyh household distances, occasional limited dcommunity distances Andrade intermittent RW due to chronci pain/balance issues.  ADL's / Homemaking Assistance Needed: modI for BADL, difficulty getting into shower, takes sponge baths        Hand Dominance   Dominant Hand: Right    Extremity/Trunk Assessment   Upper Extremity Assessment Upper Extremity Assessment: Overall WFL for tasks assessed    Lower Extremity Assessment Lower Extremity Assessment: Overall WFL for tasks assessed    Cervical / Trunk Assessment Cervical / Trunk Assessment: Normal  Communication      Cognition Arousal/Alertness: Awake/alert Behavior During Therapy: WFL for tasks assessed/performed  Overall Cognitive Status: Within Functional Limits for tasks assessed                                        General Comments      Exercises     Assessment/Plan    PT Assessment Patient needs continued PT services  PT Problem List Decreased strength;Decreased activity  tolerance;Decreased balance;Decreased mobility;Decreased coordination       PT Treatment Interventions DME instruction;Gait training;Functional mobility training;Stair training;Therapeutic activities;Therapeutic exercise;Patient/family education    PT Goals (Current goals can be found in the Care Plan section)  Acute Rehab PT Goals Patient Stated Goal: regain strength PT Goal Formulation: With patient Time For Goal Achievement: 12/05/19 Potential to Achieve Goals: Fair    Frequency Min 2X/week   Barriers to discharge        Co-evaluation               AM-PAC PT "6 Clicks" Mobility  Outcome Measure Help needed turning from your back to your side while in a flat bed without using bedrails?: None Help needed moving from lying on your back to sitting on the side of a flat bed without using bedrails?: None Help needed moving to and from a bed to a chair (including a wheelchair)?: A Little Help needed standing up from a chair using your arms (e.g., wheelchair or bedside chair)?: A Little Help needed to walk in hospital room?: A Little Help needed climbing 3-5 steps with a railing? : A Little 6 Click Score: 20    End of Session Equipment Utilized During Treatment: Gait belt Activity Tolerance: Patient tolerated treatment well;Patient limited by fatigue Patient left: in bed;with call bell/phone within reach   PT Visit Diagnosis: Difficulty in walking, not elsewhere classified (R26.2);Muscle weakness (generalized) (M62.81)    Time: 1610-9604 PT Time Calculation (min) (ACUTE ONLY): 20 min   Charges:   PT Evaluation $PT Eval Low Complexity: 1 Low PT Treatments $Therapeutic Exercise: 8-22 mins        6:03 PM, 11/21/19 Rosamaria Lints, PT, DPT Physical Therapist - [facility]  [phone] (ASCOM)    Larry Andrade 11/21/2019, 6:00 PM

## 2019-11-21 NOTE — Progress Notes (Signed)
PROGRESS NOTE    Larry Andrade.  MGQ:[id number] DOB: 04-11-1961 DOA: 11/18/2019 PCP: Rutherford Limerick, PA   Chief Complaint  Patient presents with  . Hematemesis    Brief Narrative:  59 year old male with alcoholic cirrhosis of liver (ongoing alcohol use), tobacco use, CKD stage IIIa, paroxysmal A. fib not on anticoagulation, chronic diastolic CHF who presented with hematemesis and acute blood loss anemia.  EGD on 5/24 showing grade a esophagitis with portal hypertension gastropathy.  Assessment & Plan:   Principal problem Hematemesis with acute blood loss anemia EGD showed grade a esophagitis with portal hypertensive gastropathy.  Switch Protonix drip to oral Protonix daily.  Counseled strongly on alcohol and tobacco cessation.  Avoid NSAIDs.  H&H stable after 2 unit PRBC.  Monitor H&H daily.  Active problems Acute alcoholic hepatitis Being treated with oral methylprednisolone.  Bilirubin level slowly improving.  Transaminases improving as well.  Decompensated cirrhosis of liver Secondary to ongoing alcohol use.  Platelets low but stable.  No ascites on exam.  Meld score of 23.  Alcohol abuse/tobacco abuse Counseled strongly on cessation.  No signs of alcohol withdrawal.  Monitor on CIWA.  Continue thiamine, folate and multivitamin.  Continue nicotine patch.  CKD stage IIIa Mild worsening of renal function today.  Monitor.  Essential hypertension Continue metoprolol.  Lasix amlodipine held due to soft blood pressure.  Paroxysmal A. fib Rate controlled.  Continue metoprolol and amiodarone.  Not on anticoagulation due to bleeding risk.  Chronic diastolic CHF Euvolemic.  Lasix on hold due to low normal blood pressure.  DVT prophylaxis: (SCDs Code Status: Full code Family Communication: None Disposition:   Status is: Inpatient  Remains inpatient appropriate because:IV treatments appropriate due to intensity of illness or inability to take PO patient needs to be  monitored in an inpatient setting for another 24 hours given high risk for recurrent GI bleed.   Dispo: The patient is from: Home              Anticipated d/c is to: Home              Anticipated d/c date is: 1 day              Patient currently is not medically stable to d/c.        Consultants:   GI   Procedures:  EGD   Antimicrobials:   Subjective: Seen and examined.  Denies any abdominal pain.  No hematemesis or melena.  Objective: Vitals:   11/20/19 2318 11/21/19 0754 11/21/19 1100 11/21/19 1442  BP: 101/76 115/79  118/89  Pulse: 86 81  87  Resp: 17 20  20   Temp: 98 F (36.7 C) 97.6 F (36.4 C)  98 F (36.7 C)  TempSrc: Oral Oral  Oral  SpO2: 97% 93% 91% 94%  Weight:      Height:        Intake/Output Summary (Last 24 hours) at 11/21/2019 1612 Last data filed at 11/21/2019 1400 Gross per 24 hour  Intake 548.36 ml  Output 650 ml  Net -101.64 ml   Filed Weights   11/18/19 1225  Weight: 83 kg    Examination:  General: Middle-aged male in no acute distress, appears fatigued HEENT: Pallor present, icterus + +, moist mucosa, supple neck Chest: Clear bilaterally CVs: Normal S1-S2 GI: Soft, nontender, nondistended, bowel sounds present Musculoskeletal: Warm, no edema CNS: Alert and oriented, no tremors   Data Reviewed: I have personally reviewed following labs and imaging studies  CBC: Recent Labs  Lab 11/19/19 1409 11/19/19 2024 11/20/19 0215 11/20/19 0809 11/21/19 0402  WBC 9.3 8.0 7.3 7.3 10.2  HGB 7.7* 7.7* 7.6* 8.0* 8.0*  HCT 22.0* 22.3* 21.8* 23.0* 22.9*  MCV 100.0 99.6 100.9* 100.4* 98.3  PLT 95* 95* 95* 93* 103*    Basic Metabolic Panel: Recent Labs  Lab 11/18/19 1240 11/18/19 2054 11/19/19 0204 11/20/19 0215 11/21/19 0402  NA 139 140 139 138 138  K 3.6 4.0 3.9 4.5 4.1  CL 90* 95* 96* 98 100  CO2 25 27 31 31 29   GLUCOSE 117* 111* 128* 180* 147*  BUN 23* 28* 30* 33* 35*  CREATININE 1.28* 1.24 1.14 1.31* 1.41*    CALCIUM 8.2* 7.5* 7.2* 7.3* 7.7*    GFR: Estimated Creatinine Clearance: 66.1 mL/min (A) (by C-G formula based on SCr of 1.41 mg/dL (H)).  Liver Function Tests: Recent Labs  Lab 11/18/19 1240 11/18/19 2054 11/19/19 0204 11/20/19 0215 11/21/19 0402  AST 158* 143* 136* 141* 125*  ALT 43 39 38 37 40  ALKPHOS 256* 197* 184* 175* 167*  BILITOT 14.3* 13.1* 13.9* 12.4* 9.7*  PROT 7.9 6.7 6.4* 6.4* 6.5  ALBUMIN 2.5* 2.2* 2.2* 2.0* 2.0*    CBG: Recent Labs  Lab 11/18/19 1854 11/21/19 0752  GLUCAP 92 128*     Recent Results (from the past 240 hour(s))  SARS Coronavirus 2 by RT PCR (hospital order, performed in [facility] hospital lab) Nasopharyngeal Nasopharyngeal Swab     Status: None   Collection Time: 11/18/19  2:12 PM   Specimen: Nasopharyngeal Swab  Result Value Ref Range Status   SARS Coronavirus 2 NEGATIVE NEGATIVE Final    Comment: (NOTE) SARS-CoV-2 target nucleic acids are NOT DETECTED. The SARS-CoV-2 RNA is generally detectable in upper and lower respiratory specimens during the acute phase of infection. The lowest concentration of SARS-CoV-2 viral copies this assay can detect is 250 copies / mL. A negative result does not preclude SARS-CoV-2 infection and should not be used as the sole basis for treatment or other patient management decisions.  A negative result may occur with improper specimen collection / handling, submission of specimen other than nasopharyngeal swab, presence of viral mutation(s) within the areas targeted by this assay, and inadequate number of viral copies (<250 copies / mL). A negative result must be combined with clinical observations, patient history, and epidemiological information. Fact Sheet for Patients:   11/20/19 Fact Sheet for Healthcare Providers: BoilerBrush.com.cy This test is not yet approved or cleared  by the https://pope.com/ FDA and has been authorized for detection  and/or diagnosis of SARS-CoV-2 by FDA under an Emergency Use Authorization (EUA).  This EUA will remain in effect (meaning this test can be used) for the duration of the COVID-19 declaration under Section 564(b)(1) of the Act, 21 U.S.C. section 360bbb-3(b)(1), unless the authorization is terminated or revoked sooner. Performed at [facility], [address] [place], [place]   MRSA PCR Screening     Status: Abnormal   Collection Time: 11/18/19  9:01 PM   Specimen: Nasal Mucosa; Nasopharyngeal  Result Value Ref Range Status   MRSA by PCR POSITIVE (A) NEGATIVE Final    Comment:        The GeneXpert MRSA Assay (FDA approved for NASAL specimens only), is one component of a comprehensive MRSA colonization surveillance program. It is not intended to diagnose MRSA infection nor to guide or monitor treatment for MRSA infections. RESULT CALLED TO, READ BACK BY  AND VERIFIED WITH: Melburn Popper 11/18/19 AT 2242 HS Performed at [facility], [address], [place], [place] [address]          Radiology Studies: No results found.      Scheduled Meds: . sodium chloride   Intravenous Once  . allopurinol  100 mg Oral BID  . amiodarone  200 mg Oral Daily  . folic acid  1 mg Oral Daily  . LORazepam  0-4 mg Intravenous Q12H  . methylPREDNISolone  40 mg Oral Daily  . metoprolol succinate  25 mg Oral BID  . multivitamin with minerals  1 tablet Oral Daily  . mupirocin ointment  1 application Nasal BID  . nicotine  21 mg Transdermal Daily  . thiamine  100 mg Oral Daily   Or  . thiamine  100 mg Intravenous Daily  . vitamin B-12  500 mcg Oral Daily   Continuous Infusions: . sodium chloride       LOS: 3 days    Time spent: 25 minutes    Braden Deloach, MD [facility]   To contact the attending provider between 7A-7P or the covering provider during after hours 7P-7A, please log into the web site [email or web address] and access using  universal [place] password for that web site. If you do not have the password, please call the hospital operator.  11/21/2019, 4:12 PM

## 2019-11-22 DIAGNOSIS — K7010 Alcoholic hepatitis without ascites: Secondary | ICD-10-CM

## 2019-11-22 LAB — COMPREHENSIVE METABOLIC PANEL
ALT: 57 U/L — ABNORMAL HIGH (ref 0–44)
AST: 160 U/L — ABNORMAL HIGH (ref 15–41)
Albumin: 2.2 g/dL — ABNORMAL LOW (ref 3.5–5.0)
Alkaline Phosphatase: 189 U/L — ABNORMAL HIGH (ref 38–126)
Anion gap: 9 (ref 5–15)
BUN: 33 mg/dL — ABNORMAL HIGH (ref 6–20)
CO2: 27 mmol/L (ref 22–32)
Calcium: 7.9 mg/dL — ABNORMAL LOW (ref 8.9–10.3)
Chloride: 99 mmol/L (ref 98–111)
Creatinine, Ser: 1.28 mg/dL — ABNORMAL HIGH (ref 0.61–1.24)
GFR calc Af Amer: >60 mL/min (ref >60)
GFR calc non Af Amer: >60 mL/min (ref >60)
Glucose, Bld: 153 mg/dL — ABNORMAL HIGH (ref 70–99)
Potassium: 3.7 mmol/L (ref 3.5–5.1)
Sodium: 135 mmol/L (ref 135–145)
Total Bilirubin: 8.9 mg/dL — ABNORMAL HIGH (ref 0.3–1.2)
Total Protein: 6.8 g/dL (ref 6.5–8.1)

## 2019-11-22 LAB — CBC
HCT: 22.9 % — ABNORMAL LOW (ref 39.0–52.0)
Hemoglobin: 7.9 g/dL — ABNORMAL LOW (ref 13.0–17.0)
MCH: 35.1 pg — ABNORMAL HIGH (ref 26.0–34.0)
MCHC: 34.5 g/dL (ref 30.0–36.0)
MCV: 101.8 fL — ABNORMAL HIGH (ref 80.0–100.0)
Platelets: 119 10*3/uL — ABNORMAL LOW (ref 150–400)
RBC: 2.25 MIL/uL — ABNORMAL LOW (ref 4.22–5.81)
RDW: 21.4 % — ABNORMAL HIGH (ref 11.5–15.5)
WBC: 10.7 10*3/uL — ABNORMAL HIGH (ref 4.0–10.5)
nRBC: 0.2 % (ref 0.0–0.2)

## 2019-11-22 LAB — GLUCOSE, CAPILLARY: Glucose-Capillary: 98 mg/dL (ref 70–99)

## 2019-11-22 MED ORDER — METHYLPREDNISOLONE 8 MG PO TABS
40.0000 mg | ORAL_TABLET | Freq: Every day | ORAL | 0 refills | Status: AC
Start: 2019-11-22 — End: 2019-12-20

## 2019-11-22 MED ORDER — NICOTINE 21 MG/24HR TD PT24: 21.0000 mg | MEDICATED_PATCH | Freq: Every day | TRANSDERMAL | 0 refills | Status: AC

## 2019-11-22 MED ORDER — GUAIFENESIN-DM 100-10 MG/5ML PO SYRP
5.0000 mL | ORAL_SOLUTION | ORAL | Status: DC | PRN
  Administered 2019-11-22: 5 mL via ORAL
  Filled 2019-11-22: qty 5

## 2019-11-22 MED ORDER — FOLIC ACID 1 MG PO TABS: 1.0000 mg | ORAL_TABLET | Freq: Every day | ORAL | 0 refills | Status: AC

## 2019-11-22 NOTE — Discharge Summary (Addendum)
Physician Discharge Summary  [name]. PQA:[id number] DOB: 12/29/1960 DOA: 11/18/2019  PCP: Rutherford Limerick, PA  Admit date: 11/18/2019 Discharge date: 11/22/2019  Admitted From: Home Disposition: Home  Recommendations for Outpatient Follow-up:  1. Follow-up with PCP in 1 week.  Needs CBC and LFTs checked.  Follow-up with GI in [place] in 2 weeks. 2. Patient is being discharged on methylprednisolone 40 mg daily for 4 weeks and needs to be tapered over 2 weeks thereafter.    Home Health: PT Equipment/Devices: None  Discharge Condition: Guarded CODE STATUS: Full code Diet recommendation: Heart Healthy     Discharge Diagnoses:  Principal Problem:   Hematemesis  Active Problems:   Acute blood loss anemia   Gastrointestinal hemorrhage Decompensated alcoholic cirrhosis (HCC)   Alcohol abuse   Stage 3a chronic kidney disease   Thrombocytopenia (HCC)   Essential hypertension   AF (paroxysmal atrial fibrillation) (HCC)   Chronic diastolic CHF (congestive heart failure) (HCC)   Tobacco abuse   Alcoholic hepatitis without ascites  Brief narrative/HPI 59 year old male with alcoholic cirrhosis of liver (ongoing alcohol use), tobacco use, CKD stage IIIa, paroxysmal A. fib not on anticoagulation, chronic diastolic CHF who presented with hematemesis and acute blood loss anemia.  In the ED he was found to have hemoglobin of 6.6, transaminitis with total bili of 14.3, AST of 158, ALT of 43 and alk phos of 256). Patient admitted for further management.  Of note he had EGD 5 weeks back for upper GI bleed showing portal gastropathy.  He also had small bowel capsule study on 5/7 showing small bowel erosions.   EGD on 5/24 showing grade a esophagitis with portal hypertension gastropathy.    Hospital course  Principal problem Hematemesis with acute blood loss anemia EGD showed grade a esophagitis with portal hypertensive gastropathy, possible cause of his blood loss anemia.   Switch Protonix drip to oral Protonix daily.  Counseled strongly on alcohol and tobacco cessation.  Avoid NSAIDs.  H&H stable after 2 unit PRBC.  Follow-up as outpatient.   Active problems Acute alcoholic hepatitis Maddrey's discriminant function on admission was 43.7.  Started on oral methylprednisolone 40 mg daily for 4 weeks followed by 2 weeks taper.  Bilirubin level slowly improving.  Transaminases improving as well.  Needs close monitoring as outpatient. Patient follows with gastroenterology at [facility] and needs to schedule an appointment within 2 weeks.  Decompensated cirrhosis of liver Secondary to ongoing alcohol use.  Platelets low but stable.  No ascites on exam.  Meld score of 23.  Alcohol abuse/tobacco abuse Counseled strongly on cessation.  No signs of alcohol withdrawal.    Continue thiamine, folate .Marland Kitchen  Nicotine patch prescribed.  CKD stage IIIa Stable.  Follow as outpatient.  Essential hypertension Continue metoprolol.  Lasix amlodipine held due to soft blood pressure.  Better past 24 hours.  Will resume Lasix but hold amlodipine upon discharge.  Paroxysmal A. fib Rate controlled.  Continue metoprolol and amiodarone.  Not on anticoagulation due to bleeding risk.  Chronic diastolic CHF Euvolemic.  Resume home meds.  Generalized weakness Seen by PT and recommends home health.  Procedure: EGD Consult: GI  Family communication: Spoke at length with his significant other on the phone  Disposition: Home       Discharge Instructions   Allergies as of 11/22/2019      Reactions   Chlorhexidine    No  midline present   Penicillins Rash   Has patient had a PCN reaction  causing immediate rash, facial/tongue/throat swelling, SOB or lightheadedness with hypotension: No Has patient had a PCN reaction causing severe rash involving mucus membranes or skin necrosis: No Has patient had a PCN reaction that required hospitalization: No Has patient had a PCN  reaction occurring within the last 10 years: No If all of the above answers are "NO", then may proceed with Cephalosporin use.      Medication List   Stop this medication amLODipine 10 MG tablet Commonly known as: NORVASC    TAKE these medications   albuterol (2.5 MG/3ML) 0.083% nebulizer solution Commonly known as: PROVENTIL Take 2.5 mg by nebulization every 6 (six) hours as needed for wheezing or shortness of breath.   albuterol 108 (90 Base) MCG/ACT inhaler Commonly known as: VENTOLIN HFA Inhale into the lungs every 6 (six) hours as needed for wheezing or shortness of breath.   allopurinol 100 MG tablet Commonly known as: ZYLOPRIM Take 100 mg by mouth 2 (two) times daily.   amiodarone 200 MG tablet Commonly known as: PACERONE Take 1 tablet (200 mg total) by mouth daily.      colchicine 0.6 MG tablet Take 0.6 mg by mouth as needed.   folic acid 1 MG tablet Commonly known as: FOLVITE Take 1 tablet (1 mg total) by mouth daily.   furosemide 40 MG tablet Commonly known as: LASIX Take 40 mg by mouth every morning.   methylPREDNISolone 8 MG tablet Commonly known as: Medrol Take 5 tablets (40 mg total) by mouth daily for 28 days.   metoprolol succinate 25 MG 24 hr tablet Commonly known as: TOPROL-XL Take 25 mg by mouth 2 (two) times daily.   nicotine 21 mg/24hr patch Commonly known as: NICODERM CQ - dosed in mg/24 hours Place 1 patch (21 mg total) onto the skin daily. Start taking on: Nov 23, 2019   Oxycodone HCl 10 MG Tabs Take 10 mg by mouth 3 (three) times daily as needed.   pantoprazole 40 MG tablet Commonly known as: Protonix Take 1 tablet (40 mg total) by mouth daily.   thiamine 100 MG tablet Take 1 tablet (100 mg total) by mouth daily.      Follow-up Information    August Luz A, PA Follow up.   Specialty: Physician Assistant Why: Please schedule PCP within 3-5 days of discharge date. Contact information: [place]  [address] [phone]        Corie Chiquito, NP. Schedule an appointment as soon as possible for a visit in 2 week(s).   Specialty: Gastroenterology Contact information: [address] [place] [address] [phone]          Allergies  Allergen Reactions  . Chlorhexidine     No  midline present  . Penicillins Rash    Has patient had a PCN reaction causing immediate rash, facial/tongue/throat swelling, SOB or lightheadedness with hypotension: No Has patient had a PCN reaction causing severe rash involving mucus membranes or skin necrosis: No Has patient had a PCN reaction that required hospitalization: No Has patient had a PCN reaction occurring within the last 10 years: No If all of the above answers are "NO", then may proceed with Cephalosporin use.       Subjective: Seen and examined.  Denies abdominal pain, hematemesis or melena.  Discharge Exam: Vitals:   11/22/19 0013 11/22/19 0755  BP: 112/81 118/86  Pulse: 88 86  Resp: 20   Temp: 98.3 F (36.8 C) 97.9 F (  36.6 C)  SpO2: 93% 93%   Vitals:   11/21/19 1442 11/21/19 2214 11/22/19 0013 11/22/19 0755  BP: 118/89 118/75 112/81 118/86  Pulse: 87 90 88 86  Resp: 20  20   Temp: 98 F (36.7 C)  98.3 F (36.8 C) 97.9 F (36.6 C)  TempSrc: Oral   Oral  SpO2: 94% 95% 93% 93%  Weight:      Height:        General: Middle-aged male in no acute distress,  HEENT: Pallor +, icterus + +, moist mucosa, supple neck Chest: Clear bilaterally CVs: Normal S1-S2 GI: Soft, nontender, nondistended Musculoskeletal: Warm, no edema CNS: Alert and oriented, no tremors     The results of significant diagnostics from this hospitalization (including imaging, microbiology, ancillary and laboratory) are listed below for reference.     Microbiology: Recent Results (from the past 240 hour(s))  SARS Coronavirus 2 by RT PCR (hospital order, performed in [facility] hospital lab)  Nasopharyngeal Nasopharyngeal Swab     Status: None   Collection Time: 11/18/19  2:12 PM   Specimen: Nasopharyngeal Swab  Result Value Ref Range Status   SARS Coronavirus 2 NEGATIVE NEGATIVE Final    Comment: (NOTE) SARS-CoV-2 target nucleic acids are NOT DETECTED. The SARS-CoV-2 RNA is generally detectable in upper and lower respiratory specimens during the acute phase of infection. The lowest concentration of SARS-CoV-2 viral copies this assay can detect is 250 copies / mL. A negative result does not preclude SARS-CoV-2 infection and should not be used as the sole basis for treatment or other patient management decisions.  A negative result may occur with improper specimen collection / handling, submission of specimen other than nasopharyngeal swab, presence of viral mutation(s) within the areas targeted by this assay, and inadequate number of viral copies (<250 copies / mL). A negative result must be combined with clinical observations, patient history, and epidemiological information. Fact Sheet for Patients:   StrictlyIdeas.no Fact Sheet for Healthcare Providers: BankingDealers.co.za This test is not yet approved or cleared  by the [place] FDA and has been authorized for detection and/or diagnosis of SARS-CoV-2 by FDA under an Emergency Use Authorization (EUA).  This EUA will remain in effect (meaning this test can be used) for the duration of the COVID-19 declaration under Section 564(b)(1) of the Act, 21 U.S.C. section 360bbb-3(b)(1), unless the authorization is terminated or revoked sooner. Performed at [facility], [address] [place], [place] [address]   MRSA PCR Screening     Status: Abnormal   Collection Time: 11/18/19  9:01 PM   Specimen: Nasal Mucosa; Nasopharyngeal  Result Value Ref Range Status   MRSA by PCR POSITIVE (A) NEGATIVE Final    Comment:        The GeneXpert MRSA Assay (FDA approved for  NASAL specimens only), is one component of a comprehensive MRSA colonization surveillance program. It is not intended to diagnose MRSA infection nor to guide or monitor treatment for MRSA infections. RESULT CALLED TO, READ BACK BY AND VERIFIED WITH: Tia Masker 11/18/19 AT 2242 HS Performed at [facility], [address] [place], [place] [address]      Labs: BNP (last 3 results) Recent Labs    04/06/19 1244 10/06/19 1858 11/18/19 1240  BNP 182.0* 187.0* 334.3*   Basic Metabolic Panel: Recent Labs  Lab 11/18/19 2054 11/19/19 0204 11/20/19 0215 11/21/19 0402 11/22/19 0433  NA 140 139 138 138 135  K 4.0 3.9 4.5 4.1 3.7  CL  95* 96* 98 100 99  CO2 _0 GLUCOSE 111* 128* 180* 147* 153*  BUN 28* 30* 33* 35* 33*  CREATININE 1.24 1.14 1.31* 1.41* 1.28*  CALCIUM 7.5* 7.2* 7.3* 7.7* 7.9*   Liver Function Tests: Recent Labs  Lab 11/18/19 2054 11/19/19 0204 11/20/19 0215 11/21/19 0402 11/22/19 0433  AST 143* 136* 141* 125* 160*  ALT 39 38 37 40 57*  ALKPHOS 197* 184* 175* 167* 189*  BILITOT 13.1* 13.9* 12.4* 9.7* 8.9*  PROT 6.7 6.4* 6.4* 6.5 6.8  ALBUMIN 2.2* 2.2* 2.0* 2.0* 2.2*   No results for input(s): LIPASE, AMYLASE in the last 168 hours. Recent Labs  Lab 11/18/19 1249  AMMONIA 85*   CBC: Recent Labs  Lab 11/19/19 2024 11/20/19 0215 11/20/19 0809 11/21/19 0402 11/22/19 0433  WBC 8.0 7.3 7.3 10.2 10.7*  HGB 7.7* 7.6* 8.0* 8.0* 7.9*  HCT 22.3* 21.8* 23.0* 22.9* 22.9*  MCV 99.6 100.9* 100.4* 98.3 101.8*  PLT 95* 95* 93* 103* 119*   Cardiac Enzymes: No results for input(s): CKTOTAL, CKMB, CKMBINDEX, TROPONINI in the last 168 hours. BNP: Invalid input(s): POCBNP CBG: Recent Labs  Lab 11/18/19 1854 11/21/19 0752 11/22/19 0815  GLUCAP 92 128* 98   D-Dimer No results for input(s): DDIMER in the last 72 hours. Hgb A1c No results for input(s): HGBA1C in the last 72 hours. Lipid Profile No results for input(s): CHOL,  HDL, LDLCALC, TRIG, CHOLHDL, LDLDIRECT in the last 72 hours. Thyroid function studies No results for input(s): TSH, T4TOTAL, T3FREE, THYROIDAB in the last 72 hours.  Invalid input(s): FREET3 Anemia work up No results for input(s): VITAMINB12, FOLATE, FERRITIN, TIBC, IRON, RETICCTPCT in the last 72 hours. Urinalysis    Component Value Date/Time   COLORURINE YELLOW (A) 10/07/2019 0513   APPEARANCEUR CLEAR (A) 10/07/2019 0513   LABSPEC 1.008 10/07/2019 0513   PHURINE 5.0 10/07/2019 0513   GLUCOSEU NEGATIVE 10/07/2019 0513   HGBUR SMALL (A) 10/07/2019 0513   BILIRUBINUR NEGATIVE 10/07/2019 0513   KETONESUR NEGATIVE 10/07/2019 0513   PROTEINUR NEGATIVE 10/07/2019 0513   NITRITE NEGATIVE 10/07/2019 0513   LEUKOCYTESUR NEGATIVE 10/07/2019 0513   Sepsis Labs Invalid input(s): PROCALCITONIN,  WBC,  LACTICIDVEN Microbiology Recent Results (from the past 240 hour(s))  SARS Coronavirus 2 by RT PCR (hospital order, performed in [place] hospital lab) Nasopharyngeal Nasopharyngeal Swab     Status: None   Collection Time: 11/18/19  2:12 PM   Specimen: Nasopharyngeal Swab  Result Value Ref Range Status   SARS Coronavirus 2 NEGATIVE NEGATIVE Final    Comment: (NOTE) SARS-CoV-2 target nucleic acids are NOT DETECTED. The SARS-CoV-2 RNA is generally detectable in upper and lower respiratory specimens during the acute phase of infection. The lowest concentration of SARS-CoV-2 viral copies this assay can detect is 250 copies / mL. A negative result does not preclude SARS-CoV-2 infection and should not be used as the sole basis for treatment or other patient management decisions.  A negative result may occur with improper specimen collection / handling, submission of specimen other than nasopharyngeal swab, presence of viral mutation(s) within the areas targeted by this assay, and inadequate number of viral copies (<250 copies / mL). A negative result must be combined with  clinical observations, patient history, and epidemiological information. Fact Sheet for Patients:   StrictlyIdeas.no Fact Sheet for Healthcare Providers: BankingDealers.co.za This test is not yet approved or cleared  by the [place] FDA and has been authorized for detection and/or diagnosis of  SARS-CoV-2 by FDA under an Emergency Use Authorization (EUA).  This EUA will remain in effect (meaning this test can be used) for the duration of the COVID-19 declaration under Section 564(b)(1) of the Act, 21 U.S.C. section 360bbb-3(b)(1), unless the authorization is terminated or revoked sooner. Performed at [facility], [address] [place], [place] [address]   MRSA PCR Screening     Status: Abnormal   Collection Time: 11/18/19  9:01 PM   Specimen: Nasal Mucosa; Nasopharyngeal  Result Value Ref Range Status   MRSA by PCR POSITIVE (A) NEGATIVE Final    Comment:        The GeneXpert MRSA Assay (FDA approved for NASAL specimens only), is one component of a comprehensive MRSA colonization surveillance program. It is not intended to diagnose MRSA infection nor to guide or monitor treatment for MRSA infections. RESULT CALLED TO, READ BACK BY AND VERIFIED WITH: Tia Masker 11/18/19 AT 2242 HS Performed at [facility], [address] [place], [place] [address]      Time coordinating discharge:35 minutes  SIGNED:   Louellen Molder, MD  [facility] 11/22/2019, 11:23 AM Pager   If 7PM-7AM, please contact night-coverage [email or web address] Password [email or web address]

## 2019-11-22 NOTE — Discharge Instructions (Signed)

## 2019-11-22 NOTE — TOC Transition Note (Signed)
Transition of Care [facility]) - CM/SW Discharge Note   Patient Details  Name: Kemar Pandit. MRN: [id number] Date of Birth: 1961/03/28  Transition of Care [facility]) CM/SW Contact:  Allayne Butcher, RN Phone Number: 11/22/2019, 2:19 PM   Clinical Narrative:    Patient is medically cleared for discharge home.  Patient reports that his wife will be coming to pick him up and that he has everything that he needs at home, walker, wheelchair, bedside commode, crutches.  Patient does not feel that he needs home health services at this time.  Patient informed that if he changes his mind about home health services to contact his primary care MD and he can order home health.     Final next level of care: Home/Self Care Barriers to Discharge: No Barriers Identified   Patient Goals and CMS Choice Patient states their goals for this hospitalization and ongoing recovery are:: to return home where he is independent   Choice offered to / list presented to : Patient  Discharge Placement  Home Self Care Declines home health services at this time                       Discharge Plan and Services                                     Social Determinants of Health (SDOH) Interventions     Readmission Risk Interventions Readmission Risk Prevention Plan 11/19/2019 10/08/2019 04/09/2019  Transportation Screening Complete Complete Complete  PCP or Specialist Appt within 3-5 Days Complete Complete Complete  HRI or Home Care Consult Complete Complete Complete  Social Work Consult for Recovery Care Planning/Counseling - Patient refused Patient refused  Palliative Care Screening Complete Not Applicable Not Applicable  Medication Review Oceanographer) Complete Complete Referral to Pharmacy  Some recent data might be hidden

## 2019-12-02 ENCOUNTER — Inpatient Hospital Stay
Admission: EM | Admit: 2019-12-02 | Discharge: 2019-12-27 | DRG: 871 | Disposition: E | Payer: Medicare Other | Attending: Internal Medicine | Admitting: Internal Medicine

## 2019-12-02 ENCOUNTER — Emergency Department: Payer: Medicare Other

## 2019-12-02 ENCOUNTER — Other Ambulatory Visit: Payer: Self-pay

## 2019-12-02 DIAGNOSIS — J449 Chronic obstructive pulmonary disease, unspecified: Secondary | ICD-10-CM | POA: Diagnosis present

## 2019-12-02 DIAGNOSIS — N1831 Chronic kidney disease, stage 3a: Secondary | ICD-10-CM | POA: Diagnosis present

## 2019-12-02 DIAGNOSIS — N170 Acute kidney failure with tubular necrosis: Secondary | ICD-10-CM | POA: Diagnosis present

## 2019-12-02 DIAGNOSIS — K2100 Gastro-esophageal reflux disease with esophagitis, without bleeding: Secondary | ICD-10-CM | POA: Diagnosis present

## 2019-12-02 DIAGNOSIS — J9601 Acute respiratory failure with hypoxia: Secondary | ICD-10-CM | POA: Diagnosis present

## 2019-12-02 DIAGNOSIS — K7040 Alcoholic hepatic failure without coma: Secondary | ICD-10-CM | POA: Diagnosis present

## 2019-12-02 DIAGNOSIS — D631 Anemia in chronic kidney disease: Secondary | ICD-10-CM | POA: Diagnosis present

## 2019-12-02 DIAGNOSIS — K7030 Alcoholic cirrhosis of liver without ascites: Secondary | ICD-10-CM | POA: Diagnosis present

## 2019-12-02 DIAGNOSIS — K920 Hematemesis: Secondary | ICD-10-CM | POA: Diagnosis not present

## 2019-12-02 DIAGNOSIS — G92 Toxic encephalopathy: Secondary | ICD-10-CM | POA: Diagnosis present

## 2019-12-02 DIAGNOSIS — D696 Thrombocytopenia, unspecified: Secondary | ICD-10-CM | POA: Diagnosis present

## 2019-12-02 DIAGNOSIS — K7010 Alcoholic hepatitis without ascites: Secondary | ICD-10-CM | POA: Diagnosis present

## 2019-12-02 DIAGNOSIS — N171 Acute kidney failure with acute cortical necrosis: Secondary | ICD-10-CM | POA: Diagnosis not present

## 2019-12-02 DIAGNOSIS — D7589 Other specified diseases of blood and blood-forming organs: Secondary | ICD-10-CM | POA: Diagnosis not present

## 2019-12-02 DIAGNOSIS — Z6834 Body mass index (BMI) 34.0-34.9, adult: Secondary | ICD-10-CM

## 2019-12-02 DIAGNOSIS — J154 Pneumonia due to other streptococci: Secondary | ICD-10-CM | POA: Diagnosis present

## 2019-12-02 DIAGNOSIS — R7881 Bacteremia: Secondary | ICD-10-CM | POA: Diagnosis not present

## 2019-12-02 DIAGNOSIS — E876 Hypokalemia: Secondary | ICD-10-CM | POA: Diagnosis present

## 2019-12-02 DIAGNOSIS — I5043 Acute on chronic combined systolic (congestive) and diastolic (congestive) heart failure: Secondary | ICD-10-CM | POA: Diagnosis present

## 2019-12-02 DIAGNOSIS — K7682 Hepatic encephalopathy: Secondary | ICD-10-CM | POA: Diagnosis present

## 2019-12-02 DIAGNOSIS — Z515 Encounter for palliative care: Secondary | ICD-10-CM

## 2019-12-02 DIAGNOSIS — R64 Cachexia: Secondary | ICD-10-CM | POA: Diagnosis present

## 2019-12-02 DIAGNOSIS — Z20822 Contact with and (suspected) exposure to covid-19: Secondary | ICD-10-CM | POA: Diagnosis present

## 2019-12-02 DIAGNOSIS — K7290 Hepatic failure, unspecified without coma: Secondary | ICD-10-CM | POA: Diagnosis not present

## 2019-12-02 DIAGNOSIS — K3189 Other diseases of stomach and duodenum: Secondary | ICD-10-CM | POA: Diagnosis present

## 2019-12-02 DIAGNOSIS — E872 Acidosis: Secondary | ICD-10-CM | POA: Diagnosis present

## 2019-12-02 DIAGNOSIS — I130 Hypertensive heart and chronic kidney disease with heart failure and stage 1 through stage 4 chronic kidney disease, or unspecified chronic kidney disease: Secondary | ICD-10-CM | POA: Diagnosis present

## 2019-12-02 DIAGNOSIS — A419 Sepsis, unspecified organism: Secondary | ICD-10-CM | POA: Diagnosis not present

## 2019-12-02 DIAGNOSIS — K7031 Alcoholic cirrhosis of liver with ascites: Secondary | ICD-10-CM | POA: Diagnosis not present

## 2019-12-02 DIAGNOSIS — Z66 Do not resuscitate: Secondary | ICD-10-CM | POA: Diagnosis not present

## 2019-12-02 DIAGNOSIS — Z7189 Other specified counseling: Secondary | ICD-10-CM

## 2019-12-02 DIAGNOSIS — I8510 Secondary esophageal varices without bleeding: Secondary | ICD-10-CM | POA: Diagnosis present

## 2019-12-02 DIAGNOSIS — I361 Nonrheumatic tricuspid (valve) insufficiency: Secondary | ICD-10-CM | POA: Diagnosis not present

## 2019-12-02 DIAGNOSIS — Z22322 Carrier or suspected carrier of Methicillin resistant Staphylococcus aureus: Secondary | ICD-10-CM

## 2019-12-02 DIAGNOSIS — E8809 Other disorders of plasma-protein metabolism, not elsewhere classified: Secondary | ICD-10-CM | POA: Diagnosis present

## 2019-12-02 DIAGNOSIS — B9562 Methicillin resistant Staphylococcus aureus infection as the cause of diseases classified elsewhere: Secondary | ICD-10-CM | POA: Diagnosis not present

## 2019-12-02 DIAGNOSIS — K766 Portal hypertension: Secondary | ICD-10-CM | POA: Diagnosis present

## 2019-12-02 DIAGNOSIS — A4102 Sepsis due to Methicillin resistant Staphylococcus aureus: Principal | ICD-10-CM | POA: Diagnosis present

## 2019-12-02 DIAGNOSIS — E871 Hypo-osmolality and hyponatremia: Secondary | ICD-10-CM | POA: Diagnosis present

## 2019-12-02 DIAGNOSIS — N179 Acute kidney failure, unspecified: Secondary | ICD-10-CM | POA: Diagnosis not present

## 2019-12-02 DIAGNOSIS — Z888 Allergy status to other drugs, medicaments and biological substances status: Secondary | ICD-10-CM

## 2019-12-02 DIAGNOSIS — R6521 Severe sepsis with septic shock: Secondary | ICD-10-CM | POA: Diagnosis present

## 2019-12-02 DIAGNOSIS — Z8719 Personal history of other diseases of the digestive system: Secondary | ICD-10-CM

## 2019-12-02 DIAGNOSIS — K625 Hemorrhage of anus and rectum: Secondary | ICD-10-CM | POA: Diagnosis not present

## 2019-12-02 DIAGNOSIS — J440 Chronic obstructive pulmonary disease with acute lower respiratory infection: Secondary | ICD-10-CM | POA: Diagnosis present

## 2019-12-02 DIAGNOSIS — Z880 Allergy status to penicillin: Secondary | ICD-10-CM

## 2019-12-02 DIAGNOSIS — K7469 Other cirrhosis of liver: Secondary | ICD-10-CM | POA: Diagnosis not present

## 2019-12-02 DIAGNOSIS — F1010 Alcohol abuse, uncomplicated: Secondary | ICD-10-CM | POA: Diagnosis present

## 2019-12-02 DIAGNOSIS — J9600 Acute respiratory failure, unspecified whether with hypoxia or hypercapnia: Secondary | ICD-10-CM

## 2019-12-02 DIAGNOSIS — Z7952 Long term (current) use of systemic steroids: Secondary | ICD-10-CM

## 2019-12-02 DIAGNOSIS — K439 Ventral hernia without obstruction or gangrene: Secondary | ICD-10-CM | POA: Diagnosis present

## 2019-12-02 DIAGNOSIS — M109 Gout, unspecified: Secondary | ICD-10-CM | POA: Diagnosis present

## 2019-12-02 DIAGNOSIS — K7200 Acute and subacute hepatic failure without coma: Secondary | ICD-10-CM | POA: Diagnosis not present

## 2019-12-02 DIAGNOSIS — F17210 Nicotine dependence, cigarettes, uncomplicated: Secondary | ICD-10-CM | POA: Diagnosis present

## 2019-12-02 DIAGNOSIS — I480 Paroxysmal atrial fibrillation: Secondary | ICD-10-CM | POA: Diagnosis present

## 2019-12-02 DIAGNOSIS — Z79899 Other long term (current) drug therapy: Secondary | ICD-10-CM

## 2019-12-02 DIAGNOSIS — Z8249 Family history of ischemic heart disease and other diseases of the circulatory system: Secondary | ICD-10-CM

## 2019-12-02 DIAGNOSIS — I340 Nonrheumatic mitral (valve) insufficiency: Secondary | ICD-10-CM | POA: Diagnosis not present

## 2019-12-02 LAB — TROPONIN I (HIGH SENSITIVITY)
Troponin I (High Sensitivity): 31 ng/L — ABNORMAL HIGH (ref <18)
Troponin I (High Sensitivity): 24 ng/L — ABNORMAL HIGH (ref <18)

## 2019-12-02 LAB — COMPREHENSIVE METABOLIC PANEL
ALT: 44 U/L (ref 0–44)
AST: 61 U/L — ABNORMAL HIGH (ref 15–41)
Albumin: 2.1 g/dL — ABNORMAL LOW (ref 3.5–5.0)
Alkaline Phosphatase: 119 U/L (ref 38–126)
Anion gap: 17 — ABNORMAL HIGH (ref 5–15)
BUN: 51 mg/dL — ABNORMAL HIGH (ref 6–20)
CO2: 22 mmol/L (ref 22–32)
Calcium: 7.4 mg/dL — ABNORMAL LOW (ref 8.9–10.3)
Chloride: 95 mmol/L — ABNORMAL LOW (ref 98–111)
Creatinine, Ser: 1.97 mg/dL — ABNORMAL HIGH (ref 0.61–1.24)
GFR calc Af Amer: 43 mL/min — ABNORMAL LOW (ref >60)
GFR calc non Af Amer: 37 mL/min — ABNORMAL LOW (ref >60)
Glucose, Bld: 144 mg/dL — ABNORMAL HIGH (ref 70–99)
Potassium: 4.5 mmol/L (ref 3.5–5.1)
Sodium: 134 mmol/L — ABNORMAL LOW (ref 135–145)
Total Bilirubin: 8.8 mg/dL — ABNORMAL HIGH (ref 0.3–1.2)
Total Protein: 6.1 g/dL — ABNORMAL LOW (ref 6.5–8.1)

## 2019-12-02 LAB — CBC
HCT: 26.7 % — ABNORMAL LOW (ref 39.0–52.0)
Hemoglobin: 8.9 g/dL — ABNORMAL LOW (ref 13.0–17.0)
MCH: 35.6 pg — ABNORMAL HIGH (ref 26.0–34.0)
MCHC: 33.3 g/dL (ref 30.0–36.0)
MCV: 106.8 fL — ABNORMAL HIGH (ref 80.0–100.0)
Platelets: 164 10*3/uL (ref 150–400)
RBC: 2.5 MIL/uL — ABNORMAL LOW (ref 4.22–5.81)
RDW: 20.9 % — ABNORMAL HIGH (ref 11.5–15.5)
WBC: 23.2 10*3/uL — ABNORMAL HIGH (ref 4.0–10.5)
nRBC: 0.2 % (ref 0.0–0.2)

## 2019-12-02 LAB — HEMOGLOBIN AND HEMATOCRIT, BLOOD
HCT: 24.2 % — ABNORMAL LOW (ref 39.0–52.0)
Hemoglobin: 8.1 g/dL — ABNORMAL LOW (ref 13.0–17.0)

## 2019-12-02 LAB — CORTISOL: Cortisol, Plasma: 42.5 ug/dL

## 2019-12-02 LAB — PROTIME-INR
INR: 1.8 — ABNORMAL HIGH (ref 0.8–1.2)
Prothrombin Time: 19.8 seconds — ABNORMAL HIGH (ref 11.4–15.2)

## 2019-12-02 LAB — LIPASE, BLOOD: Lipase: 25 U/L (ref 11–51)

## 2019-12-02 LAB — APTT: aPTT: 39 seconds — ABNORMAL HIGH (ref 24–36)

## 2019-12-02 LAB — BRAIN NATRIURETIC PEPTIDE: B Natriuretic Peptide: 798.3 pg/mL — ABNORMAL HIGH (ref 0.0–100.0)

## 2019-12-02 LAB — LACTIC ACID, PLASMA
Lactic Acid, Venous: 7.2 mmol/L (ref 0.5–1.9)
Lactic Acid, Venous: 5.9 mmol/L (ref 0.5–1.9)

## 2019-12-02 LAB — SARS CORONAVIRUS 2 BY RT PCR (HOSPITAL ORDER, PERFORMED IN ~~LOC~~ HOSPITAL LAB): SARS Coronavirus 2: NEGATIVE

## 2019-12-02 LAB — AMMONIA: Ammonia: 83 umol/L — ABNORMAL HIGH (ref 9–35)

## 2019-12-02 LAB — AMYLASE: Amylase: 20 U/L — ABNORMAL LOW (ref 28–100)

## 2019-12-02 LAB — PHOSPHORUS: Phosphorus: 3.7 mg/dL (ref 2.5–4.6)

## 2019-12-02 LAB — PROCALCITONIN: Procalcitonin: 7.02 ng/mL

## 2019-12-02 MED ORDER — VANCOMYCIN HCL IN DEXTROSE 1-5 GM/200ML-% IV SOLN
1000.0000 mg | Freq: Once | INTRAVENOUS | Status: DC
  Filled 2019-12-02: qty 200

## 2019-12-02 MED ORDER — SODIUM CHLORIDE 0.9 % IV SOLN: INTRAVENOUS | Status: DC | PRN

## 2019-12-02 MED ORDER — SODIUM CHLORIDE 0.9 % IV SOLN: 250.0000 mL | INTRAVENOUS | Status: DC

## 2019-12-02 MED ORDER — DOCUSATE SODIUM 100 MG PO CAPS: 100.0000 mg | ORAL_CAPSULE | Freq: Two times a day (BID) | ORAL | Status: DC | PRN

## 2019-12-02 MED ORDER — SODIUM CHLORIDE 0.9 % IV SOLN
80.0000 mg | Freq: Once | INTRAVENOUS | Status: AC
  Administered 2019-12-02: 80 mg via INTRAVENOUS
  Filled 2019-12-02: qty 80

## 2019-12-02 MED ORDER — POLYETHYLENE GLYCOL 3350 17 G PO PACK: 17.0000 g | PACK | Freq: Every day | ORAL | Status: DC | PRN

## 2019-12-02 MED ORDER — SODIUM CHLORIDE 0.9 % IV SOLN
2.0000 g | Freq: Once | INTRAVENOUS | Status: AC
  Administered 2019-12-02: 2 g via INTRAVENOUS
  Filled 2019-12-02: qty 2

## 2019-12-02 MED ORDER — ASPIRIN 300 MG RE SUPP: 300.0000 mg | RECTAL | Status: AC

## 2019-12-02 MED ORDER — DILTIAZEM HCL 25 MG/5ML IV SOLN
5.0000 mg | Freq: Once | INTRAVENOUS | Status: AC
  Administered 2019-12-02: 5 mg via INTRAVENOUS
  Filled 2019-12-02: qty 5

## 2019-12-02 MED ORDER — PANTOPRAZOLE SODIUM 40 MG IV SOLR: 40.0000 mg | Freq: Two times a day (BID) | INTRAVENOUS | Status: DC

## 2019-12-02 MED ORDER — LACTULOSE ENEMA
300.0000 mL | Freq: Two times a day (BID) | ORAL | Status: DC
  Administered 2019-12-03: 300 mL via RECTAL
  Filled 2019-12-02 (×2): qty 300

## 2019-12-02 MED ORDER — LACTATED RINGERS IV BOLUS (SEPSIS)
500.0000 mL | Freq: Once | INTRAVENOUS | Status: AC
  Administered 2019-12-02: 500 mL via INTRAVENOUS

## 2019-12-02 MED ORDER — SODIUM CHLORIDE 0.9 % IV BOLUS
500.0000 mL | Freq: Once | INTRAVENOUS | Status: AC
  Administered 2019-12-02: 500 mL via INTRAVENOUS

## 2019-12-02 MED ORDER — PANTOPRAZOLE SODIUM 40 MG IV SOLR
40.0000 mg | Freq: Two times a day (BID) | INTRAVENOUS | Status: DC
  Administered 2019-12-06 – 2019-12-08 (×6): 40 mg via INTRAVENOUS
  Filled 2019-12-02 (×6): qty 40

## 2019-12-02 MED ORDER — LACTATED RINGERS IV BOLUS (SEPSIS)
1000.0000 mL | Freq: Once | INTRAVENOUS | Status: AC
  Administered 2019-12-02: 1000 mL via INTRAVENOUS

## 2019-12-02 MED ORDER — ASPIRIN 81 MG PO CHEW
324.0000 mg | CHEWABLE_TABLET | ORAL | Status: AC
  Administered 2019-12-02: 324 mg via ORAL
  Filled 2019-12-02: qty 4

## 2019-12-02 MED ORDER — SODIUM CHLORIDE 0.9 % IV SOLN
8.0000 mg/h | INTRAVENOUS | Status: AC
  Administered 2019-12-02 – 2019-12-05 (×8): 8 mg/h via INTRAVENOUS
  Filled 2019-12-02 (×8): qty 80

## 2019-12-02 MED ORDER — ALBUMIN HUMAN 25 % IV SOLN
12.5000 g | Freq: Every day | INTRAVENOUS | Status: DC
  Administered 2019-12-02: 12.5 g via INTRAVENOUS
  Filled 2019-12-02: qty 50

## 2019-12-02 MED ORDER — SODIUM CHLORIDE 0.9 % IV SOLN
500.0000 mg | INTRAVENOUS | Status: DC
  Administered 2019-12-02: 500 mg via INTRAVENOUS
  Filled 2019-12-02: qty 500

## 2019-12-02 MED ORDER — SODIUM CHLORIDE 0.9 % IV SOLN
25.0000 ug/min | INTRAVENOUS | Status: DC
  Administered 2019-12-02: 25 ug/min via INTRAVENOUS
  Administered 2019-12-04: 50 ug/min via INTRAVENOUS
  Administered 2019-12-05 – 2019-12-06 (×11): 100 ug/min via INTRAVENOUS
  Administered 2019-12-06: 75 ug/min via INTRAVENOUS
  Administered 2019-12-06 (×2): 100 ug/min via INTRAVENOUS
  Administered 2019-12-06: 75 ug/min via INTRAVENOUS
  Administered 2019-12-06: 100 ug/min via INTRAVENOUS
  Filled 2019-12-02: qty 10
  Filled 2019-12-02: qty 1
  Filled 2019-12-02: qty 10
  Filled 2019-12-02: qty 1
  Filled 2019-12-02 (×5): qty 10
  Filled 2019-12-02 (×2): qty 1
  Filled 2019-12-02 (×3): qty 10
  Filled 2019-12-02: qty 1
  Filled 2019-12-02: qty 10
  Filled 2019-12-02 (×2): qty 1
  Filled 2019-12-02 (×2): qty 10

## 2019-12-02 MED ORDER — SODIUM CHLORIDE 0.9 % IV SOLN
50.0000 ug/h | INTRAVENOUS | Status: DC
  Administered 2019-12-02 – 2019-12-09 (×15): 50 ug/h via INTRAVENOUS
  Filled 2019-12-02 (×30): qty 1

## 2019-12-02 MED ORDER — ALBUMIN HUMAN 25 % IV SOLN
25.0000 g | Freq: Three times a day (TID) | INTRAVENOUS | Status: DC
  Administered 2019-12-03 – 2019-12-04 (×4): 25 g via INTRAVENOUS
  Filled 2019-12-02 (×4): qty 100

## 2019-12-02 MED ORDER — VANCOMYCIN HCL 2000 MG/400ML IV SOLN
2000.0000 mg | Freq: Once | INTRAVENOUS | Status: AC
  Administered 2019-12-02: 2000 mg via INTRAVENOUS
  Filled 2019-12-02: qty 400

## 2019-12-02 MED ORDER — SODIUM CHLORIDE 0.9 % IV SOLN
2.0000 g | INTRAVENOUS | Status: DC
  Administered 2019-12-03 – 2019-12-05 (×3): 2 g via INTRAVENOUS
  Filled 2019-12-02 (×2): qty 2
  Filled 2019-12-02 (×2): qty 20

## 2019-12-02 NOTE — Progress Notes (Signed)
[name], NP notified of bp 72/56 map of 63 HR 123. See new orders.

## 2019-12-02 NOTE — ED Provider Notes (Signed)
[facility] Emergency Department Provider Note ____________________________________________   First MD Initiated Contact with Patient 12/26/2019 1424     (approximate)  I have reviewed the triage vital signs and the nursing notes.   HISTORY  Chief Complaint Jaundice and Hypotension    HPI Larry Andrade. is a 59 y.o. male with PMH as noted below who presents with generalized weakness over the last few days, associated with shortness of breath especially with exertion.  The patient denies any nausea or vomiting, acute diarrhea, or fever.  He has noted some chills.  He also feels like he is more jaundiced and has worsening swelling.  Past Medical History:  Diagnosis Date  . CHF (congestive heart failure) (HCC)   . Cirrhosis (HCC)   . GERD (gastroesophageal reflux disease)   . Gout   . Hypertension   . Pancreatitis     Patient Active Problem List   Diagnosis Date Noted  . Hepatic encephalopathy syndrome (HCC) 12/22/2019  . Alcoholic hepatitis without ascites 11/22/2019  . Tobacco abuse 11/18/2019  . Hematemesis 11/18/2019  . Alcohol abuse   . Stage 3a chronic kidney disease   . Thrombocytopenia (HCC)   . Essential hypertension   . AF (paroxysmal atrial fibrillation) (HCC)   . Chronic diastolic CHF (congestive heart failure) (HCC)   . Alcoholic cirrhosis (HCC)   . Gastrointestinal hemorrhage 10/06/2019  . COPD exacerbation (HCC) 10/06/2019  . Symptomatic anemia 04/06/2019  . Malnutrition of moderate degree 09/04/2018  . Sepsis (HCC) 08/26/2018  . Acute on chronic systolic CHF (congestive heart failure) (HCC) 08/17/2018  . Lactic acid acidosis 10/25/2017  . Generalized abdominal pain   . Hematemesis with nausea   . Acute blood loss anemia   . Gout attack 02/22/2017    Past Surgical History:  Procedure Laterality Date  . CARDIAC CATHETERIZATION    . COLON SURGERY    . ESOPHAGOGASTRODUODENOSCOPY N/A 04/08/2019   Procedure:  ESOPHAGOGASTRODUODENOSCOPY (EGD);  Surgeon: [name], MD;  Location: [facility] ENDOSCOPY;  Service: Gastroenterology;  Laterality: N/A;  . ESOPHAGOGASTRODUODENOSCOPY N/A 10/09/2019   Procedure: ESOPHAGOGASTRODUODENOSCOPY (EGD);  Surgeon: Toney Reil, MD;  Location[facility] ENDOSCOPY;  Service: Gastroenterology;  Laterality: N/A;  . ESOPHAGOGASTRODUODENOSCOPY N/A 11/19/2019   Procedure: ESOPHAGOGASTRODUODENOSCOPY (EGD);  Surgeon: Toledo, Boykin Nearing, MD;  Location: [facility] ENDOSCOPY;  Service: Gastroenterology;  Laterality: N/A;  . ESOPHAGOGASTRODUODENOSCOPY (EGD) WITH PROPOFOL N/A 10/26/2017   Procedure: ESOPHAGOGASTRODUODENOSCOPY (EGD) WITH PROPOFOL;  Surgeon: [name], MD;  Location: [facility] ENDOSCOPY;  Service: Endoscopy;  Laterality: N/A;  . FLEXIBLE SIGMOIDOSCOPY N/A 04/08/2019   Procedure: FLEXIBLE SIGMOIDOSCOPY;  Surgeon: [name], MD;  Location: [facility] ENDOSCOPY;  Service: Gastroenterology;  Laterality: N/A;  . GIVENS CAPSULE STUDY  10/09/2019   Procedure: GIVENS CAPSULE STUDY;  Surgeon: Toney Reil, MD;  Location: [facility] ENDOSCOPY;  Service: Gastroenterology;;  . I & D EXTREMITY Right 09/05/2018   Procedure: IRRIGATION AND DEBRIDEMENT EXTREMITY;  Surgeon: Juanell Fairly, MD;  Location: [facility] ORS;  Service: Orthopedics;  Laterality: Right;  . KNEE ARTHROSCOPY Right 08/26/2018   Procedure: ARTHROSCOPY KNEE;  Surgeon: Juanell Fairly, MD;  Location: [facility] ORS;  Service: Orthopedics;  Laterality: Right;    Prior to Admission medications   Medication Sig Start Date End Date Taking? Authorizing Provider  allopurinol (ZYLOPRIM) 100 MG tablet Take 100 mg by mouth 2 (two) times daily.    Yes [provider]  amiodarone (PACERONE) 200 MG tablet Take 1 tablet (200 mg total) by mouth daily. 04/09/19  Yes Sherryll Burger,  Vipul, MD  folic acid (FOLVITE) 1 MG tablet Take 1 tablet (1 mg total) by mouth daily. 11/22/19  Yes Dhungel, Nishant, MD  furosemide (LASIX) 40 MG tablet Take 40 mg by mouth every  morning. 09/27/19  Yes [provider]  methylPREDNISolone (MEDROL) 8 MG tablet Take 5 tablets (40 mg total) by mouth daily for 28 days. 11/22/19 12/20/19 Yes Dhungel, Nishant, MD  metoprolol succinate (TOPROL-XL) 25 MG 24 hr tablet Take 25 mg by mouth 2 (two) times daily. 01/15/19  Yes [provider]  Oxycodone HCl 10 MG TABS Take 10 mg by mouth 3 (three) times daily as needed. 10/25/19  Yes [provider]  pantoprazole (PROTONIX) 40 MG tablet Take 1 tablet (40 mg total) by mouth daily. 10/10/19 10/09/20 Yes Wieting, Richard, MD  thiamine 100 MG tablet Take 1 tablet (100 mg total) by mouth daily. 10/11/19  Yes Wieting, Richard, MD  albuterol (PROVENTIL) (2.5 MG/3ML) 0.083% nebulizer solution Take 2.5 mg by nebulization every 6 (six) hours as needed for wheezing or shortness of breath.    [provider]  albuterol (VENTOLIN HFA) 108 (90 Base) MCG/ACT inhaler Inhale into the lungs every 6 (six) hours as needed for wheezing or shortness of breath.    [provider]  colchicine 0.6 MG tablet Take 0.6 mg by mouth as needed.    [provider]  nicotine (NICODERM CQ - DOSED IN MG/24 HOURS) 21 mg/24hr patch Place 1 patch (21 mg total) onto the skin daily. 11/23/19   Dhungel, Theda Belfast, MD    Allergies Chlorhexidine and Penicillins  Family History  Problem Relation Age of Onset  . CVA Mother   . CAD Mother   . CAD Father     Social History Social History   Tobacco Use  . Smoking status: Current Every Day Smoker    Packs/day: 1.00    Types: Cigarettes  . Smokeless tobacco: Never Used  Substance Use Topics  . Alcohol use: Yes    Comment: 1 pint of vodka a day  . Drug use: No    Review of Systems  Constitutional: No fever.  Positive for weakness. Eyes: No redness. ENT: No sore throat. Cardiovascular: Denies chest pain. Respiratory: Positive for shortness of breath. Gastrointestinal: No vomiting or diarrhea.  Genitourinary: Negative for  dysuria or hematuria.  Musculoskeletal: Negative for acute back pain. Skin: Negative for rash. Neurological: Negative for headache.   ____________________________________________   PHYSICAL EXAM:  VITAL SIGNS: ED Triage Vitals  Enc Vitals Group     BP 12/04/2019 1428 (!) 89/63     Pulse Rate 12/23/2019 1428 (!) 156     Resp 12/01/2019 1428 20     Temp 12/18/2019 1428 100.1 F (37.8 C)     Temp src --      SpO2 11/29/2019 1428 96 %     Weight 12/05/2019 1535 200 lb (90.7 kg)     Height --      Head Circumference --      Peak Flow --      Pain Score --      Pain Loc --      Pain Edu? --      Excl. in GC? --     Constitutional: Alert and oriented.  Chronically ill and weak appearing but in no acute distress. Eyes: Scleral icterus. Head: Atraumatic. Nose: No congestion/rhinnorhea. Mouth/Throat: Mucous membranes are moist.   Neck: Normal range of motion.  Cardiovascular: Tachycardic, irregular rhythm. Grossly normal heart sounds.  Good peripheral circulation. Respiratory: Normal respiratory effort.  No retractions. Lungs CTAB. Gastrointestinal: Soft with no focal tenderness.  No distention.  Genitourinary: No flank tenderness. Musculoskeletal: 2+ bilateral lower extremity edema.  Extremities warm and well perfused.  Neurologic:  Normal speech and language. No gross focal neurologic deficits are appreciated.  Skin:  Skin is warm and dry. No rash noted.  Jaundice. Psychiatric: Mood and affect are normal. Speech and behavior are normal.  ____________________________________________   LABS (all labs ordered are listed, but only abnormal results are displayed)  Labs Reviewed  AMMONIA - Abnormal; Notable for the following components:      Result Value   Ammonia 83 (*)    All other components within normal limits  CBC - Abnormal; Notable for the following components:   WBC 23.2 (*)    RBC 2.50 (*)    Hemoglobin 8.9 (*)    HCT 26.7 (*)    MCV 106.8 (*)    MCH 35.6 (*)    RDW  20.9 (*)    All other components within normal limits  COMPREHENSIVE METABOLIC PANEL - Abnormal; Notable for the following components:   Sodium 134 (*)    Chloride 95 (*)    Glucose, Bld 144 (*)    BUN 51 (*)    Creatinine, Ser 1.97 (*)    Calcium 7.4 (*)    Total Protein 6.1 (*)    Albumin 2.1 (*)    AST 61 (*)    Total Bilirubin 8.8 (*)    GFR calc non Af Amer 37 (*)    GFR calc Af Amer 43 (*)    Anion gap 17 (*)    All other components within normal limits  PROTIME-INR - Abnormal; Notable for the following components:   Prothrombin Time 19.8 (*)    INR 1.8 (*)    All other components within normal limits  LACTIC ACID, PLASMA - Abnormal; Notable for the following components:   Lactic Acid, Venous 7.2 (*)    All other components within normal limits  BRAIN NATRIURETIC PEPTIDE - Abnormal; Notable for the following components:   B Natriuretic Peptide 798.3 (*)    All other components within normal limits  TROPONIN I (HIGH SENSITIVITY) - Abnormal; Notable for the following components:   Troponin I (High Sensitivity) 31 (*)    All other components within normal limits  CULTURE, BLOOD (ROUTINE X 2)  CULTURE, BLOOD (ROUTINE X 2)  SARS CORONAVIRUS 2 BY RT PCR (HOSPITAL ORDER, PERFORMED IN [facility])  CULTURE, BLOOD (ROUTINE X 2)  CULTURE, BLOOD (ROUTINE X 2)  URINE CULTURE  LACTIC ACID, PLASMA  URINALYSIS, COMPLETE (UACMP) WITH MICROSCOPIC  PHOSPHORUS  AMYLASE  LIPASE, BLOOD  PROCALCITONIN  CORTISOL  APTT  PROTIME-INR  URINALYSIS, ROUTINE W REFLEX MICROSCOPIC  TROPONIN I (HIGH SENSITIVITY)   ____________________________________________  EKG  ED ECG REPORT I, Dionne Bucy, the attending physician, personally viewed and interpreted this ECG.  Date: Dec 09, 2019 EKG Time:  Rate:  Rhythm: Atrial fibrillation with RVR QRS Axis:  Intervals:  ST/T Wave abnormalities:  Narrative Interpretation: Atrial fibrillation with  RVR  ____________________________________________  RADIOLOGY  CXR: Pulmonary edema with superimposed infiltrates bilaterally  ____________________________________________   PROCEDURES  Procedure(s) performed: No  Procedures  Critical Care performed: Yes  CRITICAL CARE Performed by: Dionne Bucy   Total critical care time: 30 minutes  Critical care time was exclusive of separately billable procedures and treating other patients.  Critical care was necessary to treat or  prevent imminent or life-threatening deterioration.  Critical care was time spent personally by me on the following activities: development of treatment plan with patient and/or surrogate as well as nursing, discussions with consultants, evaluation of patient's response to treatment, examination of patient, obtaining history from patient or surrogate, ordering and performing treatments and interventions, ordering and review of laboratory studies, ordering and review of radiographic studies, pulse oximetry and re-evaluation of patient's condition. ____________________________________________   INITIAL IMPRESSION / ASSESSMENT AND PLAN / ED COURSE  Pertinent labs & imaging results that were available during my care of the patient were reviewed by me and considered in my medical decision making (see chart for details).  59 year old male with PMH as noted above including CHF and cirrhosis presents with generalized weakness and shortness of breath as well as worsening swelling over the last several days.  I reviewed the past medical records in epic.  The patient was most recently admitted last month and discharged on 5/27.  He had presented with hematemesis and anemia due to acute blood loss.  EGD showed esophagitis.  He was transfused 2 units of PRBCs.  On exam today, the patient is very chronically ill and weak appearing.  He is in rapid atrial fibrillation with a heart rate in the 140s, and is hypotensive.   O2 saturation is in the high 80s on room air, and goes up to the low 90s on 2 L by nasal cannula.  He has a low-grade temperature.  He is jaundiced.  Mucous membranes are moist.  There is 2+ peripheral edema.  Differential includes sepsis, CHF/fluid overload, recurrent anemia, or metabolic abnormality.  We will obtain lab work-up, give a fluid bolus, Cardizem for the atrial fibrillation.  And reassess.  Although the patient is somewhat hypotensive, I suspect that this may be partially rate related and he will benefit from rate control while getting fluids.  ----------------------------------------- 4:42 PM on 12/14/2019 -----------------------------------------  Lab work-up is significant for leukocytosis and a significantly elevated lactate.  Chest x-ray shows findings of likely edema and possible superimposed infiltrates.  Overall presentation is most consistent with sepsis.  I have ordered broad-spectrum antibiotics and fluid bolus per the sepsis protocol.  The patient's blood pressure has slightly improved although the heart rate is still elevated despite Cardizem.  The patient has no evidence of active GI bleed.  Given his hypotension, CHF, and multiple complex issues I think he will benefit from admission to the ICU.  I discussed the case with Dr. Lanney Gins who agreed to admit the patient.  ____________________________________________   FINAL CLINICAL IMPRESSION(S) / ED DIAGNOSES  Final diagnoses:  Sepsis, due to unspecified organism, unspecified whether acute organ dysfunction present [facility])      NEW MEDICATIONS STARTED DURING THIS VISIT:  New Prescriptions   No medications on file     Note:  This document was prepared using Dragon voice recognition software and may include unintentional dictation errors.    Arta Silence, MD 12/01/2019 [phone]

## 2019-12-02 NOTE — Progress Notes (Signed)
PHARMACY -  BRIEF ANTIBIOTIC NOTE   Pharmacy has received consult(s) for Vancomycin, Cefepime from an ED provider.  The patient's profile has been reviewed for ht/wt/allergies/indication/available labs.    One time order(s) placed for Vancomycin 2 gm IV X 1 and Cefepime 2 gm IV X 1.   Further antibiotics/pharmacy consults should be ordered by admitting physician if indicated.                       Thank you, Daun Rens D 12/01/2019  3:37 PM

## 2019-12-02 NOTE — H&P (Signed)
CRITICAL CARE PROGRESS NOTE    Name: Larry Andrade. MRN: [id number] DOB: Jun 13, 1961     LOS: 0   SUBJECTIVE FINDINGS & SIGNIFICANT EVENTS   Patient description:  Larry Andrade. is a 59 y.o. male with medical history significant of alcohol abuse, alcoholic cirrhosis obesity esophageal varices, hypertension, COPD, GERD, gout, pancreatitis, atrial fibrillation on anticoagulants, CKD stage III, thrombocytopenia, tobacco abuse, who presents with hematemesis.  Patient has been having recurrent hematemesis but was not vomiting at time of interview, during my evaluation there is blood around mouth and chin.   Patient has mild central abdominal pain.  No chest pain, shortness breath.  Patient has chronic cough, which has not changed.  Denies symptoms of UTI or unilateral weakness. Patient has abdominal distention and skin jaundice.  He admits to active smoking appx 1 pack daily.  He is jaundiced and in anasarca.  Critical care admission is requested due to encephalopathy, anasarca and possible GIB.    Lines / Drains: PIVx2  Cultures / Sepsis markers: Blood and urine  Antibiotics: Rocephin   Protocols / Consultants: GI, PCCM   PAST MEDICAL HISTORY   Past Medical History:  Diagnosis Date  . CHF (congestive heart failure) (HCC)   . Cirrhosis (HCC)   . GERD (gastroesophageal reflux disease)   . Gout   . Hypertension   . Pancreatitis      SURGICAL HISTORY   Past Surgical History:  Procedure Laterality Date  . CARDIAC CATHETERIZATION    . COLON SURGERY    . ESOPHAGOGASTRODUODENOSCOPY N/A 04/08/2019   Procedure: ESOPHAGOGASTRODUODENOSCOPY (EGD);  Surgeon: [name], MD;  Location: [facility] ENDOSCOPY;  Service: Gastroenterology;  Laterality: N/A;  . ESOPHAGOGASTRODUODENOSCOPY N/A 10/09/2019   Procedure:  ESOPHAGOGASTRODUODENOSCOPY (EGD);  Surgeon: Toney Reil, MD;  Location: [facility] ENDOSCOPY;  Service: Gastroenterology;  Laterality: N/A;  . ESOPHAGOGASTRODUODENOSCOPY N/A 11/19/2019   Procedure: ESOPHAGOGASTRODUODENOSCOPY (EGD);  Surgeon: Toledo, Boykin Nearing, MD;  Location: [facility] ENDOSCOPY;  Service: Gastroenterology;  Laterality: N/A;  . ESOPHAGOGASTRODUODENOSCOPY (EGD) WITH PROPOFOL N/A 10/26/2017   Procedure: ESOPHAGOGASTRODUODENOSCOPY (EGD) WITH PROPOFOL;  Surgeon: [name], MD;  Location: [facility] ENDOSCOPY;  Service: Endoscopy;  Laterality: N/A;  . FLEXIBLE SIGMOIDOSCOPY N/A 04/08/2019   Procedure: FLEXIBLE SIGMOIDOSCOPY;  Surgeon: [name], MD;  Location: [facility] ENDOSCOPY;  Service: Gastroenterology;  Laterality: N/A;  . GIVENS CAPSULE STUDY  10/09/2019   Procedure: GIVENS CAPSULE STUDY;  Surgeon: Toney Reil, MD;  Location: [facility] ENDOSCOPY;  Service: Gastroenterology;;  . I & D EXTREMITY Right 09/05/2018   Procedure: IRRIGATION AND DEBRIDEMENT EXTREMITY;  Surgeon: Juanell Fairly, MD;  Location: [facility] ORS;  Service: Orthopedics;  Laterality: Right;  . KNEE ARTHROSCOPY Right 08/26/2018   Procedure: ARTHROSCOPY KNEE;  Surgeon: Juanell Fairly, MD;  Location: [facility] ORS;  Service: Orthopedics;  Laterality: Right;     FAMILY HISTORY   Family History  Problem Relation Age of Onset  . CVA Mother   . CAD Mother   . CAD Father      SOCIAL HISTORY   Social History   Tobacco Use  . Smoking status: Current Every Day Smoker    Packs/day: 1.00    Types: Cigarettes  . Smokeless tobacco: Never Used  Substance Use Topics  . Alcohol use: Yes    Comment: 1 pint of vodka a day  . Drug use: No     MEDICATIONS   Current Medication:  Current Facility-Administered Medications:  .  aspirin chewable tablet 324 mg, 324 mg, Oral, NOW **OR** aspirin suppository  300 mg, 300 mg, Rectal, NOW, Karna Christmas, Vitaliy Eisenhour, MD .  ceFEPIme (MAXIPIME) 2 g in sodium chloride 0.9 % 100 mL IVPB, 2 g,  Intravenous, Once, Dionne Bucy, MD .  docusate sodium (COLACE) capsule 100 mg, 100 mg, Oral, BID PRN, Vida Rigger, MD .  [COMPLETED] lactated ringers bolus 1,000 mL, 1,000 mL, Intravenous, Once, Last Rate: 2,000 mL/hr at 12/26/2019 1544, 1,000 mL at 2019/12/26 1544 **AND** [COMPLETED] lactated ringers bolus 1,000 mL, 1,000 mL, Intravenous, Once, Last Rate: 2,000 mL/hr at 12/26/2019 1543, 1,000 mL at December 26, 2019 1543 **AND** lactated ringers bolus 500 mL, 500 mL, Intravenous, Once, Dionne Bucy, MD .  polyethylene glycol (MIRALAX / GLYCOLAX) packet 17 g, 17 g, Oral, Daily PRN, Vida Rigger, MD .  vancomycin (VANCOREADY) IVPB 2000 mg/400 mL, 2,000 mg, Intravenous, Once, Dionne Bucy, MD, Last Rate: 200 mL/hr at 2019-12-26 1549, 2,000 mg at 12/26/19 1549  Current Outpatient Medications:  .  allopurinol (ZYLOPRIM) 100 MG tablet, Take 100 mg by mouth 2 (two) times daily. , Disp: , Rfl:  .  amiodarone (PACERONE) 200 MG tablet, Take 1 tablet (200 mg total) by mouth daily., Disp: 30 tablet, Rfl: 0 .  folic acid (FOLVITE) 1 MG tablet, Take 1 tablet (1 mg total) by mouth daily., Disp: 30 tablet, Rfl: 0 .  furosemide (LASIX) 40 MG tablet, Take 40 mg by mouth every morning., Disp: , Rfl:  .  methylPREDNISolone (MEDROL) 8 MG tablet, Take 5 tablets (40 mg total) by mouth daily for 28 days., Disp: 140 tablet, Rfl: 0 .  metoprolol succinate (TOPROL-XL) 25 MG 24 hr tablet, Take 25 mg by mouth 2 (two) times daily., Disp: , Rfl:  .  Oxycodone HCl 10 MG TABS, Take 10 mg by mouth 3 (three) times daily as needed., Disp: , Rfl:  .  pantoprazole (PROTONIX) 40 MG tablet, Take 1 tablet (40 mg total) by mouth daily., Disp: 30 tablet, Rfl: 11 .  thiamine 100 MG tablet, Take 1 tablet (100 mg total) by mouth daily., Disp: 30 tablet, Rfl: 0 .  albuterol (PROVENTIL) (2.5 MG/3ML) 0.083% nebulizer solution, Take 2.5 mg by nebulization every 6 (six) hours as needed for wheezing or shortness of breath., Disp: , Rfl:    .  albuterol (VENTOLIN HFA) 108 (90 Base) MCG/ACT inhaler, Inhale into the lungs every 6 (six) hours as needed for wheezing or shortness of breath., Disp: , Rfl:  .  colchicine 0.6 MG tablet, Take 0.6 mg by mouth as needed., Disp: , Rfl:  .  nicotine (NICODERM CQ - DOSED IN MG/24 HOURS) 21 mg/24hr patch, Place 1 patch (21 mg total) onto the skin daily., Disp: 28 patch, Rfl: 0    ALLERGIES   Chlorhexidine and Penicillins    REVIEW OF SYSTEMS    10 point ROS done and is negative except for abdominal pain, back pain and bilateral lower extermity pain.   PHYSICAL EXAMINATION   Vital Signs: Temp:  [100.1 F (37.8 C)] 100.1 F (37.8 C) (06/06 1428) Pulse Rate:  [66-156] 66 (06/06 1545) Resp:  [18-22] 18 (06/06 1545) BP: (89-99)/(63-72) 99/72 (06/06 1430) SpO2:  [91 %-96 %] 91 % (06/06 1545) Weight:  [90.7 kg] 90.7 kg (06/06 1535)  GENERAL:age appropriate , jaundiced acutely sick appearing HEAD: Normocephalic, atraumatic.  EYES: Pupils equal, round, reactive to light.  No scleral icterus.  MOUTH: Moist mucosal membrane. NECK: Supple. No thyromegaly. No nodules. No JVD.  PULMONARY: mildly rhochorous bilterally with crakles at bases CARDIOVASCULAR: S1 and S2. Regular rate and rhythm.  No murmurs, rubs, or gallops.  GASTROINTESTINAL: Soft, nontender, non-distended. No masses. Positive bowel sounds. No hepatosplenomegaly.  MUSCULOSKELETAL: No swelling, clubbing, or edema.  NEUROLOGIC: encephalopathy SKIN:intact,warm,dry   PERTINENT DATA     Infusions: . ceFEPime (MAXIPIME) IV    . lactated ringers    . vancomycin 2,000 mg (12/17/2019 1549)   Scheduled Medications: . aspirin  324 mg Oral NOW   Or  . aspirin  300 mg Rectal NOW   PRN Medications: docusate sodium, polyethylene glycol Hemodynamic parameters:   Intake/Output: No intake/output data recorded.  Ventilator  Settings:     Other Labs:    LAB RESULTS:  Basic Metabolic Panel: Recent Labs  Lab  12/01/2019 1422  NA 134*  K 4.5  CL 95*  CO2 22  GLUCOSE 144*  BUN 51*  CREATININE 1.97*  CALCIUM 7.4*   Liver Function Tests: Recent Labs  Lab 12/15/2019 1422  AST 61*  ALT 44  ALKPHOS 119  BILITOT 8.8*  PROT 6.1*  ALBUMIN 2.1*   No results for input(s): LIPASE, AMYLASE in the last 168 hours. Recent Labs  Lab 12/06/2019 1422  AMMONIA 83*   CBC: Recent Labs  Lab 12/08/2019 1422  WBC 23.2*  HGB 8.9*  HCT 26.7*  MCV 106.8*  PLT 164   Cardiac Enzymes: No results for input(s): CKTOTAL, CKMB, CKMBINDEX, TROPONINI in the last 168 hours. BNP: Invalid input(s): POCBNP CBG: No results for input(s): GLUCAP in the last 168 hours.     IMAGING RESULTS:  Imaging: DG Chest Portable 1 View  Result Date: 12/13/2019 CLINICAL DATA:  Increased jaundice, weakness, edema; history CHF, cirrhosis, hypertension, smoker, gout EXAM: PORTABLE CHEST 1 VIEW COMPARISON:  Portable exam 1442 hours compared to 10/06/2019 FINDINGS: Enlargement of cardiac silhouette. Mediastinal contours normal. Atherosclerotic calcification aorta. BILATERAL pulmonary infiltrates new since previous exam, could represent pulmonary edema or atypical infection. Subsegmental atelectasis LEFT base. No pleural effusion or pneumothorax. Bones demineralized. IMPRESSION: Enlargement of cardiac silhouette. New BILATERAL pulmonary infiltrates question pulmonary edema versus atypical infection. Electronically Signed   By: Lavonia Dana M.D.   On: 12/04/2019 15:07   @PROBHOSP @ DG Chest Portable 1 View  Result Date: 12/01/2019 CLINICAL DATA:  Increased jaundice, weakness, edema; history CHF, cirrhosis, hypertension, smoker, gout EXAM: PORTABLE CHEST 1 VIEW COMPARISON:  Portable exam 1442 hours compared to 10/06/2019 FINDINGS: Enlargement of cardiac silhouette. Mediastinal contours normal. Atherosclerotic calcification aorta. BILATERAL pulmonary infiltrates new since previous exam, could represent pulmonary edema or atypical infection.  Subsegmental atelectasis LEFT base. No pleural effusion or pneumothorax. Bones demineralized. IMPRESSION: Enlargement of cardiac silhouette. New BILATERAL pulmonary infiltrates question pulmonary edema versus atypical infection. Electronically Signed   By: Lavonia Dana M.D.   On: 12/14/2019 15:07     ASSESSMENT AND PLAN    -Multidisciplinary rounds held today  Altered mental status with encephalopathy   - likely due to hepatic vs septic encephalopathy    - patient admitted last month with GIB    - ammonia level     - septic workup     -  lactulose enema -source control if nidus of infection is found - may need paracentesis to r/o SBP -empirically on rocephin    Portal hypertensive gastropathy -continue BID protonix -if signs of melena/hematemesis start octreotide -hx of esophageal ulcers from excessive NSAID use ICU monitoring   Per GI documentation:                  Flex Sig 04/08/2019:  - Patent end-to-side ileo-rectal  anastomosis, characterized by healthy appearing mucosa. - The examination was otherwise normal. - No specimens collected.   Last Endoscopy: 10/09/2019 ([facility]) - one superficial esophageal ulcer with no bleeding or stigmata of recent bleeding found in lower third of esophagus, 3 mm in largest dimension - mild portal hypertensive gastropathy - small hiatal hernia - normal duodenal bulb and second portion of duodenum   Endoscopy: 03/2019 [facility]) - Normal esophagus. - Normal examined duodenum. - Portal hypertensive gastropathy. - Medium-sized hiatal hernia. - No specimens collected.   Acute hypoxemic respiratory failure   -Currently requiring 2-3L/min supplemental O2    -CXR with bilateral infiltrates - possible interstitial infiltrates from CKD/hepatorenal    -empiric tx for CAP      Electrolyte derragments  - Hypomagnesemia, hypokalemia, hyponatremia  -monitor for reefing syndrome  - AM phos   - pharmacy consult for electrolyte imbalance.     Renal Failure-CKD3     - likely hepatorenal syndrome-albumin /octreotide -follow chem 7 -follow UO -continue Foley Catheter-assess need daily   Anasarca     -likely due to liver cirrhosis and renal failure    - may need to discuss HD with nephrology    ID -continue IV abx as prescibed -follow up cultures  GI/Nutrition GI PROPHYLAXIS as indicated DIET-->TF's as tolerated Constipation protocol as indicated  ENDO - ICU hypoglycemic\Hyperglycemia protocol -check FSBS per protocol   ELECTROLYTES -follow labs as needed -replace as needed -pharmacy consultation   DVT/GI PRX ordered -SCDs  TRANSFUSIONS AS NEEDED MONITOR FSBS ASSESS the need for LABS as needed   Critical care provider statement:    Critical care time (minutes):  109   Critical care time was exclusive of:  Separately billable procedures and treating other patients   Critical care was necessary to treat or prevent imminent or life-threatening deterioration of the following conditions:  acute on chronic hypoxemic respiratory failure, hepatic encephalopathy, electrolyte derarngements, CKD, hepatorenal syndrome, portal hypertensive gastropathy   Critical care was time spent personally by me on the following activities:  Development of treatment plan with patient or surrogate, discussions with consultants, evaluation of patient's response to treatment, examination of patient, obtaining history from patient or surrogate, ordering and performing treatments and interventions, ordering and review of laboratory studies and re-evaluation of patient's condition.  I assumed direction of critical care for this patient from another provider in my specialty: no    This document was prepared using Dragon voice recognition software and may include unintentional dictation errors.    Vida Rigger, M.D.  Division of Pulmonary & Critical Care Medicine  [facility]

## 2019-12-02 NOTE — Consult Note (Signed)
[facility]  CONSULT NOTE    Date: Dec 24, 2019                  Patient Name:  Larry Andrade.  MRN: [id number]  DOB: 1961/04/17  Age / Sex: 59 y.o., male         PCP: Rutherford Limerick, PA                 Service Requesting Consult: Dr. Lanney Gins                 Reason for Consult: Acute renal failure            History of Present Illness: Mr. Larry Andrade. 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   Given IV albumin 12.5g earlier this evening.   Patient states he is weak and cannot move.    Medications: Outpatient medications: Medications Prior to Admission  Medication Sig Dispense Refill Last Dose  . allopurinol (ZYLOPRIM) 100 MG tablet Take 100 mg by mouth 2 (two) times daily.    2019/12/24 at 1200  . amiodarone (PACERONE) 200 MG tablet Take 1 tablet (200 mg total) by mouth daily. 30 tablet 0 12/24/19 at 1200  . folic acid (FOLVITE) 1 MG tablet Take 1 tablet (1 mg total) by mouth daily. 30 tablet 0 12-24-19 at 1200  . furosemide (LASIX) 40 MG tablet Take 40 mg by mouth every morning.   12/24/19 at 1200  . methylPREDNISolone (MEDROL) 8 MG tablet Take 5 tablets (40 mg total) by mouth daily for 28 days. 140 tablet 0 December 24, 2019 at 1200  . metoprolol succinate (TOPROL-XL) 25 MG 24 hr tablet Take 25 mg by mouth 2 (two) times daily.   12/24/19 at 1200  . Oxycodone HCl 10 MG TABS Take 10 mg by mouth 3 (three) times daily as needed.   December 24, 2019 at 0800  . pantoprazole (PROTONIX) 40 MG tablet Take 1 tablet (40 mg total) by mouth daily. 30 tablet 11 12/24/19 at 1200  . thiamine 100 MG tablet Take 1 tablet (100 mg total) by mouth daily. 30 tablet 0 Dec 24, 2019 at 1200  . albuterol (PROVENTIL) (2.5 MG/3ML) 0.083% nebulizer solution Take 2.5 mg by nebulization every 6  (six) hours as needed for wheezing or shortness of breath.   unknown at prn  . albuterol (VENTOLIN HFA) 108 (90 Base) MCG/ACT inhaler Inhale into the lungs every 6 (six) hours as needed for wheezing or shortness of breath.   unknown at prn  . colchicine 0.6 MG tablet Take 0.6 mg by mouth as needed.   unknown at prn  . nicotine (NICODERM CQ - DOSED IN MG/24 HOURS) 21 mg/24hr patch Place 1 patch (21 mg total) onto the skin daily. 28 patch 0     Current medications: Current Facility-Administered Medications  Medication Dose Route Frequency Provider Last Rate Last Admin  . albumin human 25 % solution 12.5 g  12.5 g Intravenous Daily Ottie Glazier, MD   Stopped at 12-24-19 1858  . azithromycin (ZITHROMAX) 500 mg in sodium chloride 0.9 % 250 mL IVPB  500 mg Intravenous Q24H Ottie Glazier, MD      . Derrill Memo ON 12/03/2019] cefTRIAXone (ROCEPHIN) 2 g in sodium chloride 0.9 % 100 mL IVPB  2 g Intravenous Q24H Bradly Bienenstock, NP      .  docusate sodium (COLACE) capsule 100 mg  100 mg Oral BID PRN Vida Rigger, MD      . lactulose (CHRONULAC) enema 200 gm  300 mL Rectal BID Vida Rigger, MD      . octreotide (SANDOSTATIN) 500 mcg in sodium chloride 0.9 % 250 mL (2 mcg/mL) infusion  50 mcg/hr Intravenous Continuous Vida Rigger, MD 25 mL/hr at 12/10/2019 1812 50 mcg/hr at 12/07/2019 1812  . pantoprazole (PROTONIX) 80 mg in sodium chloride 0.9 % 100 mL (0.8 mg/mL) infusion  8 mg/hr Intravenous Continuous Harlon Ditty D, NP 10 mL/hr at 12/01/2019 2027 8 mg/hr at 12/26/2019 2027  . pantoprazole (PROTONIX) 80 mg in sodium chloride 0.9 % 100 mL IVPB  80 mg Intravenous Once Judithe Modest, NP      . Melene Muller ON 12/06/2019] pantoprazole (PROTONIX) injection 40 mg  40 mg Intravenous Q12H Harlon Ditty D, NP      . polyethylene glycol (MIRALAX / GLYCOLAX) packet 17 g  17 g Oral Daily PRN Vida Rigger, MD          Allergies: Allergies  Allergen Reactions  . Chlorhexidine     No  midline present  .  Penicillins Rash    Has patient had a PCN reaction causing immediate rash, facial/tongue/throat swelling, SOB or lightheadedness with hypotension: No Has patient had a PCN reaction causing severe rash involving mucus membranes or skin necrosis: No Has patient had a PCN reaction that required hospitalization: No Has patient had a PCN reaction occurring within the last 10 years: No If all of the above answers are "NO", then may proceed with Cephalosporin use.       Past Medical History: Past Medical History:  Diagnosis Date  . CHF (congestive heart failure) (HCC)   . Cirrhosis (HCC)   . GERD (gastroesophageal reflux disease)   . Gout   . Hypertension   . Pancreatitis      Past Surgical History: Past Surgical History:  Procedure Laterality Date  . CARDIAC CATHETERIZATION    . COLON SURGERY    . ESOPHAGOGASTRODUODENOSCOPY N/A 04/08/2019   Procedure: ESOPHAGOGASTRODUODENOSCOPY (EGD);  Surgeon: [name], MD;  Location: [facility] ENDOSCOPY;  Service: Gastroenterology;  Laterality: N/A;  . ESOPHAGOGASTRODUODENOSCOPY N/A 10/09/2019   Procedure: ESOPHAGOGASTRODUODENOSCOPY (EGD);  Surgeon: Toney Reil, MD;  Location: [facility] ENDOSCOPY;  Service: Gastroenterology;  Laterality: N/A;  . ESOPHAGOGASTRODUODENOSCOPY N/A 11/19/2019   Procedure: ESOPHAGOGASTRODUODENOSCOPY (EGD);  Surgeon: Toledo, Boykin Nearing, MD;  Location: [facility] ENDOSCOPY;  Service: Gastroenterology;  Laterality: N/A;  . ESOPHAGOGASTRODUODENOSCOPY (EGD) WITH PROPOFOL N/A 10/26/2017   Procedure: ESOPHAGOGASTRODUODENOSCOPY (EGD) WITH PROPOFOL;  Surgeon: [name], MD;  Location[facility] ENDOSCOPY;  Service: Endoscopy;  Laterality: N/A;  . FLEXIBLE SIGMOIDOSCOPY N/A 04/08/2019   Procedure: FLEXIBLE SIGMOIDOSCOPY;  Surgeon: [name], MD;  Location: [facility] ENDOSCOPY;  Service: Gastroenterology;  Laterality: N/A;  . GIVENS CAPSULE STUDY  10/09/2019   Procedure: GIVENS CAPSULE STUDY;  Surgeon: Toney Reil, MD;  Location: [facility]  ENDOSCOPY;  Service: Gastroenterology;;  . I & D EXTREMITY Right 09/05/2018   Procedure: IRRIGATION AND DEBRIDEMENT EXTREMITY;  Surgeon: Juanell Fairly, MD;  Location: [facility] ORS;  Service: Orthopedics;  Laterality: Right;  . KNEE ARTHROSCOPY Right 08/26/2018   Procedure: ARTHROSCOPY KNEE;  Surgeon: Juanell Fairly, MD;  Location: [facility] ORS;  Service: Orthopedics;  Laterality: Right;     Family History: Family History  Problem Relation Age of Onset  . CVA Mother   . CAD Mother   . CAD Father  Social History: Social History   Socioeconomic History  . Marital status: Single    Spouse name: Not on file  . Number of children: Not on file  . Years of education: Not on file  . Highest education level: Not on file  Occupational History  . Not on file  Tobacco Use  . Smoking status: Current Every Day Smoker    Packs/day: 1.00    Types: Cigarettes  . Smokeless tobacco: Never Used  Substance and Sexual Activity  . Alcohol use: Yes    Comment: 1 pint of vodka a day  . Drug use: No  . Sexual activity: Not on file  Other Topics Concern  . Not on file  Social History Narrative  . Not on file   Social Determinants of Health   Financial Resource Strain:   . Difficulty of Paying Living Expenses:   Food Insecurity:   . Worried About Programme researcher, broadcasting/film/videounning Out of Food in the Last Year:   . Baristaan Out of Food in the Last Year:   Transportation Needs:   . Freight forwarderLack of Transportation (Medical):   Marland Kitchen. Lack of Transportation (Non-Medical):   Physical Activity:   . Days of Exercise per Week:   . Minutes of Exercise per Session:   Stress:   . Feeling of Stress :   Social Connections:   . Frequency of Communication with Friends and Family:   . Frequency of Social Gatherings with Friends and Family:   . Attends Religious Services:   . Active Member of Clubs or Organizations:   . Attends BankerClub or Organization Meetings:   Marland Kitchen. Marital Status:   Intimate Partner Violence:   . Fear of Current or Ex-Partner:    . Emotionally Abused:   Marland Kitchen. Physically Abused:   . Sexually Abused:      Review of Systems: Review of Systems  Unable to perform ROS: Mental status change    Vital Signs: Blood pressure 90/67, pulse (!) 128, temperature 98.7 F (37.1 C), temperature source Oral, resp. rate (!) 23, height 6\' 2"  (1.88 m), weight 96.5 kg, SpO2 90 %.  Weight trends: Filed Weights   2019/11/18 1535 2019/11/18 1801  Weight: 90.7 kg 96.5 kg    Physical Exam: General: Ill appearing  Head: [place]/AT  Eyes: +scleral icterus  Neck: Supple, trachea midline  Lungs:  Diminished bilaterally  Heart: +tachycardia  Abdomen:  +distended, +ventral hernia  Extremities:  + peripheral edema.  Neurologic: Alert to self and place. Not to time or situation  Skin: +jaundice         Lab results: Basic Metabolic Panel: Recent Labs  Lab 2019/11/18 1422  NA 134*  K 4.5  CL 95*  CO2 22  GLUCOSE 144*  BUN 51*  CREATININE 1.97*  CALCIUM 7.4*  PHOS 3.7    Liver Function Tests: Recent Labs  Lab 2019/11/18 1422  AST 61*  ALT 44  ALKPHOS 119  BILITOT 8.8*  PROT 6.1*  ALBUMIN 2.1*   Recent Labs  Lab 2019/11/18 1422  LIPASE 25  AMYLASE 20*   Recent Labs  Lab 2019/11/18 1422  AMMONIA 83*    CBC: Recent Labs  Lab 2019/11/18 1422 2019/11/18 1959  WBC 23.2*  --   HGB 8.9* 8.1*  HCT 26.7* 24.2*  MCV 106.8*  --   PLT 164  --     Cardiac Enzymes: No results for input(s): CKTOTAL, CKMB, CKMBINDEX, TROPONINI in the last 168 hours.  BNP: Invalid input(s): POCBNP  CBG: No results for input(s):  GLUCAP in the last 168 hours.  Microbiology: Results for orders placed or performed during the hospital encounter of 12/20/2019  SARS Coronavirus 2 by RT PCR (hospital order, performed in [facility] hospital lab) Nasopharyngeal Nasopharyngeal Swab     Status: None   Collection Time: 12/07/2019  3:46 PM   Specimen: Nasopharyngeal Swab  Result Value Ref Range Status   SARS Coronavirus 2 NEGATIVE NEGATIVE Final     Comment: (NOTE) SARS-CoV-2 target nucleic acids are NOT DETECTED. The SARS-CoV-2 RNA is generally detectable in upper and lower respiratory specimens during the acute phase of infection. The lowest concentration of SARS-CoV-2 viral copies this assay can detect is 250 copies / mL. A negative result does not preclude SARS-CoV-2 infection and should not be used as the sole basis for treatment or other patient management decisions.  A negative result may occur with improper specimen collection / handling, submission of specimen other than nasopharyngeal swab, presence of viral mutation(s) within the areas targeted by this assay, and inadequate number of viral copies (<250 copies / mL). A negative result must be combined with clinical observations, patient history, and epidemiological information. Fact Sheet for Patients:   BoilerBrush.com.cy Fact Sheet for Healthcare Providers: https://pope.com/ This test is not yet approved or cleared  by the [place] FDA and has been authorized for detection and/or diagnosis of SARS-CoV-2 by FDA under an Emergency Use Authorization (EUA).  This EUA will remain in effect (meaning this test can be used) for the duration of the COVID-19 declaration under Section 564(b)(1) of the Act, 21 U.S.C. section 360bbb-3(b)(1), unless the authorization is terminated or revoked sooner. Performed at [facility], [address] [place], [place] [address]     Coagulation Studies: Recent Labs    12/04/2019 1422  LABPROT 19.8*  INR 1.8*    Urinalysis: No results for input(s): COLORURINE, LABSPEC, PHURINE, GLUCOSEU, HGBUR, BILIRUBINUR, KETONESUR, PROTEINUR, UROBILINOGEN, NITRITE, LEUKOCYTESUR in the last 72 hours.  Invalid input(s): APPERANCEUR    Imaging: DG Chest Portable 1 View  Result Date: 11/29/2019 CLINICAL DATA:  Increased jaundice, weakness, edema; history CHF, cirrhosis, hypertension,  smoker, gout EXAM: PORTABLE CHEST 1 VIEW COMPARISON:  Portable exam 1442 hours compared to 10/06/2019 FINDINGS: Enlargement of cardiac silhouette. Mediastinal contours normal. Atherosclerotic calcification aorta. BILATERAL pulmonary infiltrates new since previous exam, could represent pulmonary edema or atypical infection. Subsegmental atelectasis LEFT base. No pleural effusion or pneumothorax. Bones demineralized. IMPRESSION: Enlargement of cardiac silhouette. New BILATERAL pulmonary infiltrates question pulmonary edema versus atypical infection. Electronically Signed   By: Ulyses Southward M.D.   On: 12/22/2019 15:07      Assessment & Plan: Mr. Chez Bulnes. is a 59 y.o. white male with cirrhosis, congestive heart failure, gout, GERD, hypertension, pancreatitis, alcohol abuse who was admitted to [facility] on 11/30/2019 for Hepatic encephalopathy syndrome (HCC) [K72.90] Sepsis, due to unspecified organism, unspecified whether acute organ dysfunction present (HCC) [A41.9]  1. Acute renal failure on chronic kidney disease stage IIIA: baseline creatinine of 1.45, GFR of 53 on 4/14.  History of bland urine.  Chronic kidney disease secondary to hypertension Acute renal failure secondary to ATN  2. Anion gap metabolic acidosis: with renal failure and liver failure and ongoing alcohol use  3. Hyponatremia: secondary to cirrhosis.   4. Anasarca with hypoalbuminemia  5. Anemia with renal failure: macrocytosis: hemoglobin 8.1.  Recent GI bleed.   6. 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   Plan - IV  albumin. Change to 25g IV q8.      LOS: 0 Sherryl Valido 6/6/20218:30 PM

## 2019-12-02 NOTE — ED Triage Notes (Signed)
Pt presents via EMS c/o increased jaundice, weakness, increased edema. Pt full code. Hx Cirrhosis.

## 2019-12-02 NOTE — Progress Notes (Signed)
PHARMACY CONSULT NOTE - FOLLOW UP  Pharmacy Consult for Electrolyte Monitoring and Replacement   Recent Labs: Potassium (mmol/L)  Date Value  12/15/2019 4.5   Magnesium (mg/dL)  Date Value  81/82/9937 1.9   Calcium (mg/dL)  Date Value  16/96/7893 7.4 (L)   Calcium, Total (PTH) (mg/dL)  Date Value  81/06/7508 9.4   Albumin (g/dL)  Date Value  25/85/2778 2.1 (L)   Phosphorus (mg/dL)  Date Value  24/23/5361 2.7   Sodium (mmol/L)  Date Value  12/24/2019 134 (L)     Assessment: Ca = 7.4,  Alb = 2.1 , Corrected Ca = 8.92 Na slightly low but will likely improve with fluids   Goal of Therapy:  Electrolytes WNL   Plan:  No electrolytes supplementation needed at this time.   Will recheck electrolytes on 6/7 with AM labs.   Scherrie Gerlach ,PharmD Clinical Pharmacist 12/07/2019 4:42 PM

## 2019-12-02 NOTE — ED Notes (Signed)
ED Provider at bedside. 

## 2019-12-03 ENCOUNTER — Inpatient Hospital Stay (HOSPITAL_COMMUNITY)
Admit: 2019-12-03 | Discharge: 2019-12-03 | Disposition: A | Discharge status: Home / Self Care | Payer: Medicare Other | Attending: Pulmonary Disease | Admitting: Pulmonary Disease

## 2019-12-03 DIAGNOSIS — I340 Nonrheumatic mitral (valve) insufficiency: Secondary | ICD-10-CM

## 2019-12-03 DIAGNOSIS — K7290 Hepatic failure, unspecified without coma: Secondary | ICD-10-CM

## 2019-12-03 DIAGNOSIS — K7031 Alcoholic cirrhosis of liver with ascites: Secondary | ICD-10-CM

## 2019-12-03 DIAGNOSIS — Z7189 Other specified counseling: Secondary | ICD-10-CM

## 2019-12-03 DIAGNOSIS — Z515 Encounter for palliative care: Secondary | ICD-10-CM

## 2019-12-03 DIAGNOSIS — A419 Sepsis, unspecified organism: Secondary | ICD-10-CM

## 2019-12-03 DIAGNOSIS — K7460 Unspecified cirrhosis of liver: Secondary | ICD-10-CM

## 2019-12-03 DIAGNOSIS — R6521 Severe sepsis with septic shock: Secondary | ICD-10-CM

## 2019-12-03 DIAGNOSIS — I361 Nonrheumatic tricuspid (valve) insufficiency: Secondary | ICD-10-CM

## 2019-12-03 DIAGNOSIS — R7881 Bacteremia: Secondary | ICD-10-CM

## 2019-12-03 DIAGNOSIS — B9562 Methicillin resistant Staphylococcus aureus infection as the cause of diseases classified elsewhere: Secondary | ICD-10-CM

## 2019-12-03 LAB — BASIC METABOLIC PANEL
Anion gap: 17 — ABNORMAL HIGH (ref 5–15)
BUN: 55 mg/dL — ABNORMAL HIGH (ref 6–20)
CO2: 20 mmol/L — ABNORMAL LOW (ref 22–32)
Calcium: 7 mg/dL — ABNORMAL LOW (ref 8.9–10.3)
Chloride: 95 mmol/L — ABNORMAL LOW (ref 98–111)
Creatinine, Ser: 2.32 mg/dL — ABNORMAL HIGH (ref 0.61–1.24)
GFR calc Af Amer: 35 mL/min — ABNORMAL LOW (ref >60)
GFR calc non Af Amer: 30 mL/min — ABNORMAL LOW (ref >60)
Glucose, Bld: 182 mg/dL — ABNORMAL HIGH (ref 70–99)
Potassium: 4.4 mmol/L (ref 3.5–5.1)
Sodium: 132 mmol/L — ABNORMAL LOW (ref 135–145)

## 2019-12-03 LAB — URINALYSIS, ROUTINE W REFLEX MICROSCOPIC
Bilirubin Urine: NEGATIVE
Glucose, UA: NEGATIVE mg/dL
Ketones, ur: NEGATIVE mg/dL
Leukocytes,Ua: NEGATIVE
Nitrite: NEGATIVE
Protein, ur: NEGATIVE mg/dL
RBC / HPF: >50 RBC/hpf — ABNORMAL HIGH (ref 0–5)
Specific Gravity, Urine: 1.015 (ref 1.005–1.030)
pH: 5 (ref 5.0–8.0)

## 2019-12-03 LAB — BLOOD CULTURE ID PANEL (REFLEXED)
Acinetobacter baumannii: NOT DETECTED
Candida albicans: NOT DETECTED
Candida glabrata: NOT DETECTED
Candida krusei: NOT DETECTED
Candida parapsilosis: NOT DETECTED
Candida tropicalis: NOT DETECTED
Enterobacter cloacae complex: NOT DETECTED
Enterobacteriaceae species: NOT DETECTED
Enterococcus species: NOT DETECTED
Escherichia coli: NOT DETECTED
Haemophilus influenzae: NOT DETECTED
Klebsiella oxytoca: NOT DETECTED
Klebsiella pneumoniae: NOT DETECTED
Listeria monocytogenes: NOT DETECTED
Methicillin resistance: DETECTED — AB
Neisseria meningitidis: NOT DETECTED
Proteus species: NOT DETECTED
Pseudomonas aeruginosa: NOT DETECTED
Serratia marcescens: NOT DETECTED
Staphylococcus aureus (BCID): DETECTED — AB
Staphylococcus species: DETECTED — AB
Streptococcus agalactiae: NOT DETECTED
Streptococcus pneumoniae: DETECTED — AB
Streptococcus pyogenes: NOT DETECTED
Streptococcus species: DETECTED — AB

## 2019-12-03 LAB — CBC
HCT: 24.5 % — ABNORMAL LOW (ref 39.0–52.0)
Hemoglobin: 8 g/dL — ABNORMAL LOW (ref 13.0–17.0)
MCH: 35.4 pg — ABNORMAL HIGH (ref 26.0–34.0)
MCHC: 32.7 g/dL (ref 30.0–36.0)
MCV: 108.4 fL — ABNORMAL HIGH (ref 80.0–100.0)
Platelets: 124 10*3/uL — ABNORMAL LOW (ref 150–400)
RBC: 2.26 MIL/uL — ABNORMAL LOW (ref 4.22–5.81)
RDW: 21.1 % — ABNORMAL HIGH (ref 11.5–15.5)
WBC: 24.2 10*3/uL — ABNORMAL HIGH (ref 4.0–10.5)
nRBC: 0 % (ref 0.0–0.2)

## 2019-12-03 LAB — HEMOGLOBIN AND HEMATOCRIT, BLOOD
HCT: 23.6 % — ABNORMAL LOW (ref 39.0–52.0)
HCT: 21.5 % — ABNORMAL LOW (ref 39.0–52.0)
HCT: 21.9 % — ABNORMAL LOW (ref 39.0–52.0)
Hemoglobin: 7.8 g/dL — ABNORMAL LOW (ref 13.0–17.0)
Hemoglobin: 7.1 g/dL — ABNORMAL LOW (ref 13.0–17.0)
Hemoglobin: 7.1 g/dL — ABNORMAL LOW (ref 13.0–17.0)

## 2019-12-03 LAB — PROCALCITONIN: Procalcitonin: 11.91 ng/mL

## 2019-12-03 LAB — LACTIC ACID, PLASMA: Lactic Acid, Venous: 6.8 mmol/L (ref 0.5–1.9)

## 2019-12-03 LAB — PHOSPHORUS: Phosphorus: 5.3 mg/dL — ABNORMAL HIGH (ref 2.5–4.6)

## 2019-12-03 LAB — MAGNESIUM: Magnesium: 1.4 mg/dL — ABNORMAL LOW (ref 1.7–2.4)

## 2019-12-03 MED ORDER — ALLOPURINOL 100 MG PO TABS
100.0000 mg | ORAL_TABLET | Freq: Two times a day (BID) | ORAL | Status: DC
  Administered 2019-12-03 – 2019-12-08 (×10): 100 mg via ORAL
  Filled 2019-12-03 (×13): qty 1

## 2019-12-03 MED ORDER — VANCOMYCIN HCL 1500 MG/300ML IV SOLN
1500.0000 mg | INTRAVENOUS | Status: DC
  Administered 2019-12-03: 1500 mg via INTRAVENOUS
  Filled 2019-12-03 (×2): qty 300

## 2019-12-03 MED ORDER — ONDANSETRON HCL 4 MG/2ML IJ SOLN
4.0000 mg | Freq: Four times a day (QID) | INTRAMUSCULAR | Status: DC | PRN
  Administered 2019-12-07: 4 mg via INTRAVENOUS
  Filled 2019-12-03: qty 2

## 2019-12-03 MED ORDER — THIAMINE HCL 100 MG PO TABS
100.0000 mg | ORAL_TABLET | Freq: Every day | ORAL | Status: DC
  Administered 2019-12-03 – 2019-12-08 (×6): 100 mg via ORAL
  Filled 2019-12-03 (×7): qty 1

## 2019-12-03 MED ORDER — SODIUM CHLORIDE 0.9 % IV SOLN: INTRAVENOUS | Status: DC

## 2019-12-03 MED ORDER — NICOTINE 21 MG/24HR TD PT24
21.0000 mg | MEDICATED_PATCH | Freq: Every day | TRANSDERMAL | Status: DC
  Administered 2019-12-03 – 2019-12-08 (×6): 21 mg via TRANSDERMAL
  Filled 2019-12-03 (×6): qty 1

## 2019-12-03 MED ORDER — LEVALBUTEROL HCL 0.63 MG/3ML IN NEBU
0.6300 mg | INHALATION_SOLUTION | Freq: Four times a day (QID) | RESPIRATORY_TRACT | Status: DC | PRN
  Administered 2019-12-03 – 2019-12-04 (×2): 0.63 mg via RESPIRATORY_TRACT
  Filled 2019-12-03 (×2): qty 3

## 2019-12-03 MED ORDER — METOPROLOL SUCCINATE ER 50 MG PO TB24
25.0000 mg | ORAL_TABLET | Freq: Two times a day (BID) | ORAL | Status: DC
  Administered 2019-12-03 – 2019-12-05 (×6): 25 mg via ORAL
  Filled 2019-12-03 (×9): qty 1

## 2019-12-03 MED ORDER — OXYCODONE HCL 5 MG PO TABS
10.0000 mg | ORAL_TABLET | Freq: Three times a day (TID) | ORAL | Status: DC | PRN
  Administered 2019-12-03 – 2019-12-08 (×11): 10 mg via ORAL
  Filled 2019-12-03 (×11): qty 2

## 2019-12-03 MED ORDER — AMIODARONE HCL IN DEXTROSE 360-4.14 MG/200ML-% IV SOLN
60.0000 mg/h | INTRAVENOUS | Status: DC
  Administered 2019-12-04 – 2019-12-09 (×19): 60 mg/h via INTRAVENOUS
  Filled 2019-12-03 (×20): qty 200

## 2019-12-03 MED ORDER — MAGNESIUM SULFATE 2 GM/50ML IV SOLN
2.0000 g | Freq: Once | INTRAVENOUS | Status: AC
  Administered 2019-12-03: 2 g via INTRAVENOUS
  Filled 2019-12-03: qty 50

## 2019-12-03 MED ORDER — ORAL CARE MOUTH RINSE
15.0000 mL | Freq: Two times a day (BID) | OROMUCOSAL | Status: DC
  Administered 2019-12-03 – 2019-12-08 (×9): 15 mL via OROMUCOSAL

## 2019-12-03 MED ORDER — AMIODARONE HCL 200 MG PO TABS
200.0000 mg | ORAL_TABLET | Freq: Every day | ORAL | Status: DC
  Administered 2019-12-03: 200 mg via ORAL
  Filled 2019-12-03: qty 1

## 2019-12-03 MED ORDER — FOLIC ACID 1 MG PO TABS
1.0000 mg | ORAL_TABLET | Freq: Every day | ORAL | Status: DC
  Administered 2019-12-03 – 2019-12-08 (×6): 1 mg via ORAL
  Filled 2019-12-03 (×6): qty 1

## 2019-12-03 MED ORDER — LACTATED RINGERS IV BOLUS
1000.0000 mL | Freq: Once | INTRAVENOUS | Status: AC
  Administered 2019-12-03: 1000 mL via INTRAVENOUS

## 2019-12-03 MED ORDER — AMIODARONE LOAD VIA INFUSION
150.0000 mg | Freq: Once | INTRAVENOUS | Status: AC
  Administered 2019-12-03: 150 mg via INTRAVENOUS
  Filled 2019-12-03: qty 83.34

## 2019-12-03 MED ORDER — LACTULOSE 10 GM/15ML PO SOLN
30.0000 g | Freq: Two times a day (BID) | ORAL | Status: DC
  Administered 2019-12-03 – 2019-12-08 (×9): 30 g via ORAL
  Filled 2019-12-03 (×10): qty 60

## 2019-12-03 MED ORDER — AMIODARONE HCL IN DEXTROSE 360-4.14 MG/200ML-% IV SOLN
60.0000 mg/h | INTRAVENOUS | Status: AC
  Administered 2019-12-03 – 2019-12-04 (×2): 60 mg/h via INTRAVENOUS
  Filled 2019-12-03 (×2): qty 200

## 2019-12-03 NOTE — Consult Note (Signed)
NAME: Larry HumanJohn Millea Jr.  DOB: 1960-08-08  MRN: [id number]  Date/Time: 12/03/2019 12:10 PM  REQUESTING PROVIDER: Dr. Belia HemanKasa Subjective:  REASON FOR CONSULT: MRSA bacteremia ?pt a poor historian- chart reviewed Larry HumanJohn Chirico Jr. is a 59 y.o. male with a history of decompensated cirrhosis, alcohol abuse, recurrent GI bleed, CHF, gout  MRSA septic arthritis with bacteremia in February 2020 had right knee arthroscopic irrigation and debridement with extensive synovectomy on 08/26/2018 and 6 weeks of IV vancomycin which he completed on 10/17/2018..  Admitted with increasing jaundice, weakness, edema legs and shortness of breath going on for a few days on 12/19/2019. In the ED the blood pressure was 89/63, heart rate of 146, respiratory rate of 20, temperature of 100.1. EKG revealed atrial fibrillation with RVR.  Chest x-ray showed pulmonary edema with superimposed infiltrates bilaterally. WBC was 23.2, hemoglobin 8.9, platelet 164, lactic acid 7.2, AST 61, ALT 44, ammonia 83, total bilirubin 8.8, BNP 798, procalcitonin 7.02, creatinine 1.97, albumin 2.1. Patient received vancomycin, cefepime and metronidazole and then switched to ceftriaxone. As the blood cultures showing MRSA as well as Streptococcus pneumonia I am asked to see the patient.  Patient is currently on vancomycin and ceftriaxone.  Patient was recently in hospital between 11/18/2019 until 11/22/2019 who presented with hematemesis and acute blood loss anemia.  He had a hemoglobin of 6.6 , total bili of 14.3, ALT of 43, AST of 158 and alkaline phosphatase is 256.  An EGD done on 524 showed esophagitis with portal hypertension gastropathy.  As he had acute alcoholic hepatitis his he was started on methylprednisone 40 mg daily for 4 weeks followed by a 2-week taper.  [facility]. Prior to that he was hospitalized between  10/06/2019 until 10/10/2019 with acute upper GI bleeding along with COPD  exacerbation.  During that hospitalization endoscopy showed hypertensive gastropathy but no active bleeding.  Capsule endoscopy was negative.  Hemoglobin remained stable after transfusion.  He also received IV iron infusion.  Past Medical History:  Diagnosis Date  . CHF (congestive heart failure) (HCC)   . Cirrhosis (HCC)   . GERD (gastroesophageal reflux disease)   . Gout   . Hypertension   . Pancreatitis     Past Surgical History:  Procedure Laterality Date  . CARDIAC CATHETERIZATION    . COLON SURGERY    . ESOPHAGOGASTRODUODENOSCOPY N/A 04/08/2019   Procedure: ESOPHAGOGASTRODUODENOSCOPY (EGD);  Surgeon: Wyline MoodAnna, Kiran, MD;  [facility];  Service: Gastroenterology;  Laterality: N/A;  . ESOPHAGOGASTRODUODENOSCOPY N/A 10/09/2019   Procedure: ESOPHAGOGASTRODUODENOSCOPY (EGD);  Surgeon: Toney ReilVanga, Rohini Reddy, MD;  [facility];  Service: Gastroenterology;  Laterality: N/A;  . ESOPHAGOGASTRODUODENOSCOPY N/A 11/19/2019   Procedure: ESOPHAGOGASTRODUODENOSCOPY (EGD);  Surgeon: Toledo, Boykin Nearingeodoro K, MD;  Location: [facility] ENDOSCOPY;  Service: Gastroenterology;  Laterality: N/A;  . ESOPHAGOGASTRODUODENOSCOPY (EGD) WITH PROPOFOL N/A 10/26/2017   Procedure: ESOPHAGOGASTRODUODENOSCOPY (EGD) WITH PROPOFOL;  Surgeon: Wyline MoodAnna, Kiran, MD;  [facility];  Service: Endoscopy;  Laterality: N/A;  . FLEXIBLE SIGMOIDOSCOPY N/A 04/08/2019   Procedure: FLEXIBLE SIGMOIDOSCOPY;  Surgeon: Wyline MoodAnna, Kiran, MD;  [facility];  Service: Gastroenterology;  Laterality: N/A;  . GIVENS CAPSULE STUDY  10/09/2019   Procedure: GIVENS CAPSULE STUDY;  Surgeon: Toney ReilVanga, Rohini Reddy, MD;  [facility];  Service: Gastroenterology;;  . I & D EXTREMITY Right 09/05/2018   Procedure: IRRIGATION AND DEBRIDEMENT EXTREMITY;  Surgeon: Juanell FairlyKrasinski, Kevin, MD;  Location: [facility] ORS;  Service: Orthopedics;  Laterality: Right;  . KNEE ARTHROSCOPY Right 08/26/2018  Procedure: ARTHROSCOPY KNEE;  Surgeon:  Juanell Fairly, MD;  Location: [facility] ORS;  Service: Orthopedics;  Laterality: Right;    Social History   Socioeconomic History  . Marital status: Single    Spouse name: Not on file  . Number of children: Not on file  . Years of education: Not on file  . Highest education level: Not on file  Occupational History  . Not on file  Tobacco Use  . Smoking status: Current Every Day Smoker    Packs/day: 1.00    Types: Cigarettes  . Smokeless tobacco: Never Used  Substance and Sexual Activity  . Alcohol use: Yes    Comment: 1 pint of vodka a day  . Drug use: No  . Sexual activity: Not on file  Other Topics Concern  . Not on file  Social History Narrative  . Not on file   Social Determinants of Health   Financial Resource Strain:   . Difficulty of Paying Living Expenses:   Food Insecurity:   . Worried About Programme researcher, broadcasting/film/video in the Last Year:   . Barista in the Last Year:   Transportation Needs:   . Freight forwarder (Medical):   Marland Kitchen Lack of Transportation (Non-Medical):   Physical Activity:   . Days of Exercise per Week:   . Minutes of Exercise per Session:   Stress:   . Feeling of Stress :   Social Connections:   . Frequency of Communication with Friends and Family:   . Frequency of Social Gatherings with Friends and Family:   . Attends Religious Services:   . Active Member of Clubs or Organizations:   . Attends Banker Meetings:   Marland Kitchen Marital Status:   Intimate Partner Violence:   . Fear of Current or Ex-Partner:   . Emotionally Abused:   Marland Kitchen Physically Abused:   . Sexually Abused:     Family History  Problem Relation Age of Onset  . CVA Mother   . CAD Mother   . CAD Father    Allergies  Allergen Reactions  . Chlorhexidine     No  midline present  . Penicillins Rash    Has patient had a PCN reaction causing immediate rash, facial/tongue/throat swelling, SOB or lightheadedness with hypotension: No Has patient had a PCN reaction causing  severe rash involving mucus membranes or skin necrosis: No Has patient had a PCN reaction that required hospitalization: No Has patient had a PCN reaction occurring within the last 10 years: No If all of the above answers are "NO", then may proceed with Cephalosporin use.    I? Current Facility-Administered Medications  Medication Dose Route Frequency Provider Last Rate Last Admin  . 0.9 %  sodium chloride infusion   Intravenous PRN Harlon Ditty D, NP 10 mL/hr at 12/03/19 0130 Rate Verify at 12/03/19 0130  . 0.9 %  sodium chloride infusion  250 mL Intravenous Continuous Harlon Ditty D, NP      . 0.9 %  sodium chloride infusion   Intravenous Continuous Harlon Ditty D, NP 50 mL/hr at 12/03/19 1000 Rate Verify at 12/03/19 1000  . albumin human 25 % solution 25 g  25 g Intravenous Q8H Kolluru, Sarath, MD 60 mL/hr at 12/03/19 0612 25 g at 12/03/19 0612  . allopurinol (ZYLOPRIM) tablet 100 mg  100 mg Oral BID Erin Fulling, MD   100 mg at 12/03/19 1123  . amiodarone (PACERONE) tablet 200 mg  200 mg Oral Daily Kasa,  Wallis Bamberg, MD   200 mg at 12/03/19 1123  . cefTRIAXone (ROCEPHIN) 2 g in sodium chloride 0.9 % 100 mL IVPB  2 g Intravenous Q24H Harlon Ditty D, NP 200 mL/hr at 12/03/19 0507 2 g at 12/03/19 0507  . docusate sodium (COLACE) capsule 100 mg  100 mg Oral BID PRN Vida Rigger, MD      . folic acid (FOLVITE) tablet 1 mg  1 mg Oral Daily Erin Fulling, MD   1 mg at 12/03/19 1123  . lactulose (CHRONULAC) 10 GM/15ML solution 30 g  30 g Oral BID Erin Fulling, MD   30 g at 12/03/19 1033  . magnesium sulfate IVPB 2 g 50 mL  2 g Intravenous Once Tressie Ellis, RPH      . MEDLINE mouth rinse  15 mL Mouth Rinse BID Harlon Ditty D, NP   15 mL at 12/03/19 0432  . metoprolol succinate (TOPROL-XL) 24 hr tablet 25 mg  25 mg Oral BID Erin Fulling, MD   25 mg at 12/03/19 1123  . nicotine (NICODERM CQ - dosed in mg/24 hours) patch 21 mg  21 mg Transdermal Daily Erin Fulling, MD   21 mg at  12/03/19 1124  . octreotide (SANDOSTATIN) 500 mcg in sodium chloride 0.9 % 250 mL (2 mcg/mL) infusion  50 mcg/hr Intravenous Continuous Vida Rigger, MD 25 mL/hr at 12/03/19 1000 50 mcg/hr at 12/03/19 1000  . oxyCODONE (Oxy IR/ROXICODONE) immediate release tablet 10 mg  10 mg Oral TID PRN Erin Fulling, MD   10 mg at 12/03/19 1123  . pantoprazole (PROTONIX) 80 mg in sodium chloride 0.9 % 100 mL (0.8 mg/mL) infusion  8 mg/hr Intravenous Continuous Harlon Ditty D, NP 10 mL/hr at 12/03/19 1000 8 mg/hr at 12/03/19 1000  . [START ON 12/06/2019] pantoprazole (PROTONIX) injection 40 mg  40 mg Intravenous Q12H Harlon Ditty D, NP      . phenylephrine (NEO-SYNEPHRINE) 10 mg in sodium chloride 0.9 % 250 mL (0.04 mg/mL) infusion  25-200 mcg/min Intravenous Titrated Harlon Ditty D, NP 15 mL/hr at 12/03/19 1036 10 mcg/min at 12/03/19 1036  . polyethylene glycol (MIRALAX / GLYCOLAX) packet 17 g  17 g Oral Daily PRN Vida Rigger, MD      . thiamine tablet 100 mg  100 mg Oral Daily Erin Fulling, MD   100 mg at 12/03/19 1123  . vancomycin (VANCOREADY) IVPB 1500 mg/300 mL  1,500 mg Intravenous Q24H Harlon Ditty D, NP         Abtx:  Anti-infectives (From admission, onward)   Start     Dose/Rate Route Frequency Ordered Stop   12/03/19 1600  vancomycin (VANCOREADY) IVPB 1500 mg/300 mL     1,500 mg 150 mL/hr over 120 Minutes Intravenous Every 24 hours 12/03/19 0125     12/03/19 0600  cefTRIAXone (ROCEPHIN) 2 g in sodium chloride 0.9 % 100 mL IVPB     2 g 200 mL/hr over 30 Minutes Intravenous Every 24 hours 11/30/2019 1934     12/06/2019 1700  azithromycin (ZITHROMAX) 500 mg in sodium chloride 0.9 % 250 mL IVPB  Status:  Discontinued     500 mg 250 mL/hr over 60 Minutes Intravenous Every 24 hours 12/16/2019 1654 12/03/19 0119   12/22/2019 1545  vancomycin (VANCOCIN) IVPB 1000 mg/200 mL premix  Status:  Discontinued     1,000 mg 200 mL/hr over 60 Minutes Intravenous  Once 12/13/2019 1532 12/04/2019 1536    12/05/2019 1545  ceFEPIme (MAXIPIME) 2 g in sodium  chloride 0.9 % 100 mL IVPB     2 g 200 mL/hr over 30 Minutes Intravenous  Once 2019/12/21 1532 12-21-2019 1724   Dec 21, 2019 1545  vancomycin (VANCOREADY) IVPB 2000 mg/400 mL     2,000 mg 200 mL/hr over 120 Minutes Intravenous  Once 12-21-19 1536 12-21-2019 1806      REVIEW OF SYSTEMS:  Const:  fever,  chills, negative weight loss Eyes: negative diplopia or visual changes, negative eye pain ENT: negative coryza, negative sore throat Resp: has cough, hemoptysis, has dyspnea Cards: negative for chest pain, palpitations, has lower extremity edema GU some difficulty in passing urine while lying in bed GI: has hematemesis, dark stool Skin: negative for rash and pruritus Heme:  easy bruising and gum/nose bleeding MS: generalized weakness Neurolo:negative for headaches, dizziness, vertigo, has memory problems  Psych:, depression  Endocrine: negative for thyroid, diabetes Allergy/Immunology- as above Objective:  VITALS:  BP 98/64   Pulse (!) 113   Temp 98.1 F (36.7 C) (Oral)   Resp 10   Ht 6\' 2"  (1.88 m)   Wt 96.8 kg   SpO2 98%   BMI 27.40 kg/m  PHYSICAL EXAM:  General: awake, ill looking, jaundiced Head: Normocephalic, without obvious abnormality, atraumatic. Eyes: Conjunctivae icteric ,  ENT Nares normal. No drainage or sinus tenderness. Lips, mucosa, and tongue normal. poor dentition. B/l parotid enlargement Neck: Supple,  Back: did not examine Lungs: b/l air entry- decreased bases . Heart: irregular Abdomen: distended Extremities: b/l edema legs Skin:  bruising Lymph: Cervical, supraclavicular normal. Neurologic: Grossly non-focal Pertinent Labs Lab Results CBC    Component Value Date/Time   WBC 24.2 (H) 12/03/2019 0114   RBC 2.26 (L) 12/03/2019 0114   HGB 7.8 (L) 12/03/2019 0719   HCT 23.6 (L) 12/03/2019 0719   PLT 124 (L) 12/03/2019 0114   MCV 108.4 (H) 12/03/2019 0114   MCH 35.4 (H) 12/03/2019 0114   MCHC 32.7  12/03/2019 0114   RDW 21.1 (H) 12/03/2019 0114   LYMPHSABS 2.3 10/06/2019 1858   MONOABS 0.7 10/06/2019 1858   EOSABS 0.2 10/06/2019 1858   BASOSABS 0.1 10/06/2019 1858    CMP Latest Ref Rng & Units 12/03/2019 December 21, 2019 11/22/2019  Glucose 70 - 99 mg/dL 11/24/2019) 419(F) 790(W)  BUN 6 - 20 mg/dL 409(B) 35(H) 29(J)  Creatinine 0.61 - 1.24 mg/dL 24(Q) 6.83(M) 1.96(Q)  Sodium 135 - 145 mmol/L 132(L) 134(L) 135  Potassium 3.5 - 5.1 mmol/L 4.4 4.5 3.7  Chloride 98 - 111 mmol/L 95(L) 95(L) 99  CO2 22 - 32 mmol/L 20(L) 22 27  Calcium 8.9 - 10.3 mg/dL 7.0(L) 7.4(L) 7.9(L)  Total Protein 6.5 - 8.1 g/dL - 6.1(L) 6.8  Total Bilirubin 0.3 - 1.2 mg/dL - 8.8(H) 8.9(H)  Alkaline Phos 38 - 126 U/L - 119 189(H)  AST 15 - 41 U/L - 61(H) 160(H)  ALT 0 - 44 U/L - 44 57(H)      Microbiology: Recent Results (from the past 240 hour(s))  Culture, blood (routine x 2)     Status: None (Preliminary result)   Collection Time: 12/21/2019  2:22 PM   Specimen: BLOOD  Result Value Ref Range Status   Specimen Description BLOOD LEFT ANTECUBITAL  Final   Special Requests   Final    BOTTLES DRAWN AEROBIC AND ANAEROBIC Blood Culture adequate volume   Culture  Setup Time   Final    Organism ID to follow GRAM POSITIVE COCCI IN BOTH AEROBIC AND ANAEROBIC BOTTLES CRITICAL RESULT CALLED TO, READ BACK  BY AND VERIFIED WITH: DAVID BESANTI 12/03/19 AT 0105 HS Performed at [facility], [address] [place], [place] [address]    Culture [facility] POSITIVE COCCI  Final   Report Status PENDING  Incomplete  Blood Culture ID Panel (Reflexed)     Status: Abnormal   Collection Time: 12/18/2019  2:22 PM  Result Value Ref Range Status   Enterococcus species NOT DETECTED NOT DETECTED Final   Listeria monocytogenes NOT DETECTED NOT DETECTED Final   Staphylococcus species DETECTED (A) NOT DETECTED Final    Comment: CRITICAL RESULT CALLED TO, READ BACK BY AND VERIFIED WITH: DAVID BESANTI 12/03/19 AT 0105 HS    Staphylococcus  aureus (BCID) DETECTED (A) NOT DETECTED Final    Comment: Methicillin (oxacillin)-resistant Staphylococcus aureus (MRSA). MRSA is predictably resistant to beta-lactam antibiotics (except ceftaroline). Preferred therapy is vancomycin unless clinically contraindicated. Patient requires contact precautions if  hospitalized. CRITICAL RESULT CALLED TO, READ BACK BY AND VERIFIED WITH: DAVID BESANTI 12/03/19 AT 0105 HS    Methicillin resistance DETECTED (A) NOT DETECTED Final    Comment: CRITICAL RESULT CALLED TO, READ BACK BY AND VERIFIED WITH: DAVID BESANTI 12/03/19 AT 0105 HS    Streptococcus species DETECTED (A) NOT DETECTED Final    Comment: CRITICAL RESULT CALLED TO, READ BACK BY AND VERIFIED WITH: DAVID BESANTI 12/03/19 AT 0105 HS    Streptococcus agalactiae NOT DETECTED NOT DETECTED Final   Streptococcus pneumoniae DETECTED (A) NOT DETECTED Final    Comment: CRITICAL RESULT CALLED TO, READ BACK BY AND VERIFIED WITH: DAVID BESANTI 12/03/19 AT 0105 HS    Streptococcus pyogenes NOT DETECTED NOT DETECTED Final   Acinetobacter baumannii NOT DETECTED NOT DETECTED Final   Enterobacteriaceae species NOT DETECTED NOT DETECTED Final   Enterobacter cloacae complex NOT DETECTED NOT DETECTED Final   Escherichia coli NOT DETECTED NOT DETECTED Final   Klebsiella oxytoca NOT DETECTED NOT DETECTED Final   Klebsiella pneumoniae NOT DETECTED NOT DETECTED Final   Proteus species NOT DETECTED NOT DETECTED Final   Serratia marcescens NOT DETECTED NOT DETECTED Final   Haemophilus influenzae NOT DETECTED NOT DETECTED Final   Neisseria meningitidis NOT DETECTED NOT DETECTED Final   Pseudomonas aeruginosa NOT DETECTED NOT DETECTED Final   Candida albicans NOT DETECTED NOT DETECTED Final   Candida glabrata NOT DETECTED NOT DETECTED Final   Candida krusei NOT DETECTED NOT DETECTED Final   Candida parapsilosis NOT DETECTED NOT DETECTED Final   Candida tropicalis NOT DETECTED NOT DETECTED Final    Comment: Performed  at [facility], [address] [place], [place] [address]  Culture, blood (routine x 2)     Status: None (Preliminary result)   Collection Time: 11/30/2019  3:46 PM   Specimen: BLOOD  Result Value Ref Range Status   Specimen Description BLOOD LEFT ANTECUBITAL  Final   Special Requests   Final    BOTTLES DRAWN AEROBIC AND ANAEROBIC Blood Culture results may not be optimal due to an excessive volume of blood received in culture bottles   Culture  Setup Time   Final    GRAM POSITIVE COCCI IN BOTH AEROBIC AND ANAEROBIC BOTTLES CRITICAL VALUE NOTED.  VALUE IS CONSISTENT WITH PREVIOUSLY REPORTED AND CALLED VALUE. Performed at [facility], [address] [place], [place] [address]    Culture [facility] POSITIVE COCCI  Final   Report Status PENDING  Incomplete  SARS Coronavirus 2 by RT PCR (hospital order, performed in [facility] hospital lab) Nasopharyngeal Nasopharyngeal Swab  Status: None   Collection Time: 12/03/2019  3:46 PM   Specimen: Nasopharyngeal Swab  Result Value Ref Range Status   SARS Coronavirus 2 NEGATIVE NEGATIVE Final    Comment: (NOTE) SARS-CoV-2 target nucleic acids are NOT DETECTED. The SARS-CoV-2 RNA is generally detectable in upper and lower respiratory specimens during the acute phase of infection. The lowest concentration of SARS-CoV-2 viral copies this assay can detect is 250 copies / mL. A negative result does not preclude SARS-CoV-2 infection and should not be used as the sole basis for treatment or other patient management decisions.  A negative result may occur with improper specimen collection / handling, submission of specimen other than nasopharyngeal swab, presence of viral mutation(s) within the areas targeted by this assay, and inadequate number of viral copies (<250 copies / mL). A negative result must be combined with clinical observations, patient history, and epidemiological information. Fact Sheet for Patients:    StrictlyIdeas.no Fact Sheet for Healthcare Providers: BankingDealers.co.za This test is not yet approved or cleared  by the [place] FDA and has been authorized for detection and/or diagnosis of SARS-CoV-2 by FDA under an Emergency Use Authorization (EUA).  This EUA will remain in effect (meaning this test can be used) for the duration of the COVID-19 declaration under Section 564(b)(1) of the Act, 21 U.S.C. section 360bbb-3(b)(1), unless the authorization is terminated or revoked sooner. Performed at [facility] [place] [facility], [address] [employer], [place] [address]     IMAGING RESULTS:  I have personally reviewed the films ? Impression/Recommendation ? ?Decompensated liver cirrhosis with anasarca  -MRSA bacteremia and strep pneumo bacteremia with shock- on pressure support  On Vanco and ceftriaxone- because of CKD may have to consider ceftaroline Repeat blood culture to see whether bacteremia has cleared 2 d echo done today   AKI on CKD.  Atrial fibrillation  Congestive heart failure ? Pneumonia MRSA nares positive  Recurrent GI bleed due to portal hypertension-on octreotide  Anemia    History of septic arthritis left knee with MRSA status post synovectomy in March 2021 and treated with 6 weeks of IV antibiotics   ? ___________________________________________________ Discussed with care team Note:  This document was prepared using Dragon voice recognition software and may include unintentional dictation errors.

## 2019-12-03 NOTE — Consult Note (Addendum)
Consultation Note Date: 12/03/2019   Patient Name: Larry Andrade.  DOB: [birth year]  MRN: [id number]  Age / Sex: 59 y.o., male  PCP: Gildardo Pounds, PA Referring Physician: Erin Fulling, MD  Reason for Consultation: Establishing goals of care  HPI/Patient Profile: Larry Andradeis a 59 y.o.malewith medical history significant of alcohol abuse, alcoholic cirrhosis obesity esophageal varices, hypertension, COPD, GERD, gout, pancreatitis, atrial fibrillation on anticoagulants, CKD stage III, thrombocytopenia, tobacco abuse, who presents with hematemesis.  Clinical Assessment and Goals of Care: Patient is resting in bed. He is alert and oriented x4 answering all questions correctly at this time. He has a long term significant other. He has a living mother, and siblings. He states he has no current HPOA papers and requested his significant other to be his HPOA with his mother and "youngest sister" Leanne as his secondary HPOA's.   He states at home, he has a lot of joint pain. He is able to walk to the bathroom. He uses a walker because he is afraid he will fall. He states this is all difficult because a month ago he was able to go fishing, although he had to park very close to the location. He used to enjoy hunting but is unable to do that anymore. He is a retired Chartered certified accountant for [employer]. He states his QOL is poor.   We discussed his diagnosis, prognosis, GOC, EOL wishes disposition and options.  A detailed discussion was had today regarding advanced directives.  Concepts specific to code status, artifical feeding and hydration, IV antibiotics and rehospitalization were discussed.  The difference between an aggressive medical intervention path and a comfort care path was discussed.  Values and goals of care important to patient and family were attempted to be elicited.  Discussed limitations of medical  interventions to prolong quality of life in some situations and discussed the concept of human mortality.  He states he wants to complete HPOA/living will papers. He would like to meet with the HPOA's tomorrow to discuss care moving forward. Currently, he wants full code and full scope care until we can speak tomorrow. He states he would never want a tracheostomy, and would not want to be placed on a ventilator longer than a couple of days to see if there was a correctable problem.    Significant other called per patient's request to set up a meeting with the 3 requested HPOA's for tomorrow, and to answer any questions she may have. She does not have questions and discusses the struggles they have gone through and what the previous physicians have told them. She states she understands where he is and does not want him to suffer.     SUMMARY OF RECOMMENDATIONS   Remeet at tomorrow 10:00. Chaplain to bedside.   Prognosis:   Poor      Primary Diagnoses: Present on Admission: . Hepatic encephalopathy syndrome (HCC)   I have reviewed the medical record, interviewed the patient and family, and examined the patient. The following aspects are pertinent.  Past Medical History:  Diagnosis Date  . CHF (congestive heart failure) ([place])   . Cirrhosis ([place])   . GERD (gastroesophageal reflux disease)   . Gout   . Hypertension   . Pancreatitis    Social History   Socioeconomic History  . Marital status: Single    Spouse name: Not on file  . Number of children: Not on file  . Years of education: Not on file  . Highest education level: Not on file  Occupational History  . Not on file  Tobacco Use  . Smoking status: Current Every Day Smoker    Packs/day: 1.00    Types: Cigarettes  . Smokeless tobacco: Never Used  Substance and Sexual Activity  . Alcohol use: Yes    Comment: 1 pint of vodka a day  . Drug use: No  . Sexual activity: Not on file  Other Topics Concern  . Not on file    Social History Narrative  . Not on file   Social Determinants of Health   Financial Resource Strain:   . Difficulty of Paying Living Expenses:   Food Insecurity:   . Worried About Charity fundraiser in the Last Year:   . Arboriculturist in the Last Year:   Transportation Needs:   . Film/video editor (Medical):   Marland Kitchen Lack of Transportation (Non-Medical):   Physical Activity:   . Days of Exercise per Week:   . Minutes of Exercise per Session:   Stress:   . Feeling of Stress :   Social Connections:   . Frequency of Communication with Friends and Family:   . Frequency of Social Gatherings with Friends and Family:   . Attends Religious Services:   . Active Member of Clubs or Organizations:   . Attends Archivist Meetings:   Marland Kitchen Marital Status:    Family History  Problem Relation Age of Onset  . CVA Mother   . CAD Mother   . CAD Father    Scheduled Meds: . allopurinol  100 mg Oral BID  . amiodarone  200 mg Oral Daily  . folic acid  1 mg Oral Daily  . lactulose  30 g Oral BID  . mouth rinse  15 mL Mouth Rinse BID  . metoprolol succinate  25 mg Oral BID  . nicotine  21 mg Transdermal Daily  . [START ON 12/06/2019] pantoprazole  40 mg Intravenous Q12H  . thiamine  100 mg Oral Daily   Continuous Infusions: . sodium chloride 10 mL/hr at 12/03/19 0130  . sodium chloride    . sodium chloride Stopped (12/03/19 1309)  . albumin human 60 mL/hr at 12/03/19 1400  . cefTRIAXone (ROCEPHIN)  IV Stopped (12/03/19 1311)  . octreotide  (SANDOSTATIN)    IV infusion 50 mcg/hr (12/03/19 1515)  . pantoprozole (PROTONIX) infusion 8 mg/hr (12/03/19 1515)  . phenylephrine (NEO-SYNEPHRINE) Adult infusion 25 mcg/min (12/03/19 1400)  . vancomycin 1,500 mg (12/03/19 1523)   PRN Meds:.sodium chloride, docusate sodium, oxyCODONE, polyethylene glycol Medications Prior to Admission:  Prior to Admission medications   Medication Sig Start Date End Date Taking? Authorizing Provider   allopurinol (ZYLOPRIM) 100 MG tablet Take 100 mg by mouth 2 (two) times daily.    Yes [provider]  amiodarone (PACERONE) 200 MG tablet Take 1 tablet (200 mg total) by mouth daily. 04/09/19  Yes Max Sane, MD  folic acid (FOLVITE) 1 MG tablet Take 1 tablet (1 mg total) by mouth daily. 11/22/19  Yes Dhungel, Nishant, MD  furosemide (LASIX) 40 MG tablet Take 40 mg by mouth every morning. 09/27/19  Yes [provider]  methylPREDNISolone (MEDROL) 8 MG tablet Take 5 tablets (40 mg total) by mouth daily for 28 days. 11/22/19 12/20/19 Yes Dhungel, Nishant, MD  metoprolol succinate (TOPROL-XL) 25 MG 24 hr tablet Take 25 mg by mouth 2 (two) times daily. 01/15/19  Yes [provider]  Oxycodone HCl 10 MG TABS Take 10 mg by mouth 3 (three) times daily as needed. 10/25/19  Yes [provider]  pantoprazole (PROTONIX) 40 MG tablet Take 1 tablet (40 mg total) by mouth daily. 10/10/19 10/09/20 Yes Wieting, Richard, MD  thiamine 100 MG tablet Take 1 tablet (100 mg total) by mouth daily. 10/11/19  Yes Wieting, Richard, MD  albuterol (PROVENTIL) (2.5 MG/3ML) 0.083% nebulizer solution Take 2.5 mg by nebulization every 6 (six) hours as needed for wheezing or shortness of breath.    [provider]  albuterol (VENTOLIN HFA) 108 (90 Base) MCG/ACT inhaler Inhale into the lungs every 6 (six) hours as needed for wheezing or shortness of breath.    [provider]  colchicine 0.6 MG tablet Take 0.6 mg by mouth as needed.    [provider]  nicotine (NICODERM CQ - DOSED IN MG/24 HOURS) 21 mg/24hr patch Place 1 patch (21 mg total) onto the skin daily. 11/23/19   Dhungel, Theda Belfast, MD   Allergies  Allergen Reactions  . Chlorhexidine     No  midline present  . Penicillins Rash    Has patient had a PCN reaction causing immediate rash, facial/tongue/throat swelling, SOB or lightheadedness with hypotension: No Has patient had a PCN reaction causing severe rash involving  mucus membranes or skin necrosis: No Has patient had a PCN reaction that required hospitalization: No Has patient had a PCN reaction occurring within the last 10 years: No If all of the above answers are "NO", then may proceed with Cephalosporin use.    Review of Systems  Gastrointestinal: Positive for abdominal pain.  Musculoskeletal: Positive for arthralgias.    Physical Exam Pulmonary:     Effort: Pulmonary effort is normal.  Neurological:     Mental Status: He is alert and oriented to person, place, and time.     Vital Signs: BP 96/70   Pulse (!) 130   Temp 98 F (36.7 C) (Oral)   Resp 16   Ht 6\' 2"  (1.88 m)   Wt 96.8 kg   SpO2 92%   BMI 27.40 kg/m  Pain Scale: 0-10   Pain Score: Asleep   SpO2: SpO2: 92 % O2 Device:SpO2: 92 % O2 Flow Rate: .O2 Flow Rate (L/min): 2 L/min  IO: Intake/output summary:   Intake/Output Summary (Last 24 hours) at 12/03/2019 1524 Last data filed at 12/03/2019 1400 Gross per 24 hour  Intake 3000.73 ml  Output 1775 ml  Net 1225.73 ml    LBM: Last BM Date: 12/03/19 Baseline Weight: Weight: 90.7 kg Most recent weight: Weight: 96.8 kg     Palliative Assessment/Data:     Time In: 2:30 Time Out: 3:40 Time Total: 70 min Greater than 50%  of this time was spent counseling and coordinating care related to the above assessment and plan.  Discussed with RN, and CCM.   Signed by: 02/02/20, NP   Please contact Palliative Medicine Team phone at [phone] for questions and concerns.  For individual provider: See [phone]

## 2019-12-03 NOTE — Progress Notes (Signed)
[facility]  ROUNDING NOTE   Subjective:   Unresponsive UO +diarrhea  Creatinine 2.332(1.97)  Octreotide and pantoprazole gtt Phenylephrine gtt  Blood cultures with gram positive cocci  Objective:  Vital signs in last 24 hours:  Temp:  [97.4 F (36.3 C)-100.1 F (37.8 C)] 98.1 F (36.7 C) (06/07 0800) Pulse Rate:  [66-156] 113 (06/07 1000) Resp:  [9-24] 10 (06/07 1000) BP: (67-109)/(53-92) 98/64 (06/07 1000) SpO2:  [90 %-100 %] 98 % (06/07 1000) Weight:  [90.7 kg-96.8 kg] 96.8 kg (06/07 0500)  Weight change:  Filed Weights   12/26/2019 1535 12/08/2019 1801 12/03/19 0500  Weight: 90.7 kg 96.5 kg 96.8 kg    Intake/Output: I/O last 3 completed shifts: In: 985.5 [I.V.:735.6; IV Piggyback:249.8] Out: 1775 [Urine:425; Stool:1350]   Intake/Output this shift:  Total I/O In: 1476.5 [I.V.:367.4; IV Piggyback:1109.1] Out: -   Physical Exam: General: Critically ill  Head: Normocephalic, atraumatic. Moist oral mucosal membranes  Eyes: +scleral icterus  Neck: trachea midline  Lungs:  Diminished bilaterally  Heart: Irregular, tachycardia  Abdomen:  +ascites  Extremities:  ++ peripheral edema.  Neurologic: Not alert or oriented  Skin: +jaundice        Basic Metabolic Panel: Recent Labs  Lab 12/22/2019 1422 12/03/19 0114  NA 134* 132*  K 4.5 4.4  CL 95* 95*  CO2 22 20*  GLUCOSE 144* 182*  BUN 51* 55*  CREATININE 1.97* 2.32*  CALCIUM 7.4* 7.0*  MG  --  1.4*  PHOS 3.7 5.3*    Liver Function Tests: Recent Labs  Lab 12/01/2019 1422  AST 61*  ALT 44  ALKPHOS 119  BILITOT 8.8*  PROT 6.1*  ALBUMIN 2.1*   Recent Labs  Lab 11/28/2019 1422  LIPASE 25  AMYLASE 20*   Recent Labs  Lab 12/25/2019 1422  AMMONIA 83*    CBC: Recent Labs  Lab 11/28/2019 1422 12/06/2019 1959 12/03/19 0114 12/03/19 0719  WBC 23.2*  --  24.2*  --   HGB 8.9* 8.1* 8.0* 7.8*  HCT 26.7* 24.2* 24.5* 23.6*  MCV 106.8*  --  108.4*  --   PLT 164  --  124*  --      Cardiac Enzymes: No results for input(s): CKTOTAL, CKMB, CKMBINDEX, TROPONINI in the last 168 hours.  BNP: Invalid input(s): POCBNP  CBG: No results for input(s): GLUCAP in the last 168 hours.  Microbiology: Results for orders placed or performed during the hospital encounter of 12/19/2019  Culture, blood (routine x 2)     Status: None (Preliminary result)   Collection Time: 11/29/2019  2:22 PM   Specimen: BLOOD  Result Value Ref Range Status   Specimen Description BLOOD LEFT ANTECUBITAL  Final   Special Requests   Final    BOTTLES DRAWN AEROBIC AND ANAEROBIC Blood Culture adequate volume   Culture  Setup Time   Final    Organism ID to follow GRAM POSITIVE COCCI IN BOTH AEROBIC AND ANAEROBIC BOTTLES CRITICAL RESULT CALLED TO, READ BACK BY AND VERIFIED WITH: DAVID BESANTI 12/03/19 AT 0105 HS Performed at [facility], [address] [place], [place] [address]    Culture [facility] POSITIVE COCCI  Final   Report Status PENDING  Incomplete  Blood Culture ID Panel (Reflexed)     Status: Abnormal   Collection Time: 12/20/2019  2:22 PM  Result Value Ref Range Status   Enterococcus species NOT DETECTED NOT DETECTED Final   Listeria monocytogenes NOT DETECTED NOT DETECTED Final   Staphylococcus species DETECTED (  A) NOT DETECTED Final    Comment: CRITICAL RESULT CALLED TO, READ BACK BY AND VERIFIED WITH: DAVID BESANTI 12/03/19 AT 0105 HS    Staphylococcus aureus (BCID) DETECTED (A) NOT DETECTED Final    Comment: Methicillin (oxacillin)-resistant Staphylococcus aureus (MRSA). MRSA is predictably resistant to beta-lactam antibiotics (except ceftaroline). Preferred therapy is vancomycin unless clinically contraindicated. Patient requires contact precautions if  hospitalized. CRITICAL RESULT CALLED TO, READ BACK BY AND VERIFIED WITH: DAVID BESANTI 12/03/19 AT 0105 HS    Methicillin resistance DETECTED (A) NOT DETECTED Final    Comment: CRITICAL RESULT CALLED TO, READ BACK BY AND VERIFIED  WITH: DAVID BESANTI 12/03/19 AT 0105 HS    Streptococcus species DETECTED (A) NOT DETECTED Final    Comment: CRITICAL RESULT CALLED TO, READ BACK BY AND VERIFIED WITH: DAVID BESANTI 12/03/19 AT 0105 HS    Streptococcus agalactiae NOT DETECTED NOT DETECTED Final   Streptococcus pneumoniae DETECTED (A) NOT DETECTED Final    Comment: CRITICAL RESULT CALLED TO, READ BACK BY AND VERIFIED WITH: DAVID BESANTI 12/03/19 AT 0105 HS    Streptococcus pyogenes NOT DETECTED NOT DETECTED Final   Acinetobacter baumannii NOT DETECTED NOT DETECTED Final   Enterobacteriaceae species NOT DETECTED NOT DETECTED Final   Enterobacter cloacae complex NOT DETECTED NOT DETECTED Final   Escherichia coli NOT DETECTED NOT DETECTED Final   Klebsiella oxytoca NOT DETECTED NOT DETECTED Final   Klebsiella pneumoniae NOT DETECTED NOT DETECTED Final   Proteus species NOT DETECTED NOT DETECTED Final   Serratia marcescens NOT DETECTED NOT DETECTED Final   Haemophilus influenzae NOT DETECTED NOT DETECTED Final   Neisseria meningitidis NOT DETECTED NOT DETECTED Final   Pseudomonas aeruginosa NOT DETECTED NOT DETECTED Final   Candida albicans NOT DETECTED NOT DETECTED Final   Candida glabrata NOT DETECTED NOT DETECTED Final   Candida krusei NOT DETECTED NOT DETECTED Final   Candida parapsilosis NOT DETECTED NOT DETECTED Final   Candida tropicalis NOT DETECTED NOT DETECTED Final    Comment: Performed at [facility], [address] [place], [place] [address]  Culture, blood (routine x 2)     Status: None (Preliminary result)   Collection Time: 2019/12/19  3:46 PM   Specimen: BLOOD  Result Value Ref Range Status   Specimen Description BLOOD LEFT ANTECUBITAL  Final   Special Requests   Final    BOTTLES DRAWN AEROBIC AND ANAEROBIC Blood Culture results may not be optimal due to an excessive volume of blood received in culture bottles   Culture  Setup Time   Final    GRAM POSITIVE COCCI IN BOTH AEROBIC AND ANAEROBIC  BOTTLES CRITICAL VALUE NOTED.  VALUE IS CONSISTENT WITH PREVIOUSLY REPORTED AND CALLED VALUE. Performed at [facility], [address] [place], [place] [address]    Culture [facility] POSITIVE COCCI  Final   Report Status PENDING  Incomplete  SARS Coronavirus 2 by RT PCR (hospital order, performed in [facility] hospital lab) Nasopharyngeal Nasopharyngeal Swab     Status: None   Collection Time: 2019/12/19  3:46 PM   Specimen: Nasopharyngeal Swab  Result Value Ref Range Status   SARS Coronavirus 2 NEGATIVE NEGATIVE Final    Comment: (NOTE) SARS-CoV-2 target nucleic acids are NOT DETECTED. The SARS-CoV-2 RNA is generally detectable in upper and lower respiratory specimens during the acute phase of infection. The lowest concentration of SARS-CoV-2 viral copies this assay can detect is 250 copies / mL. A negative result does not preclude SARS-CoV-2 infection and should not be  used as the sole basis for treatment or other patient management decisions.  A negative result may occur with improper specimen collection / handling, submission of specimen other than nasopharyngeal swab, presence of viral mutation(s) within the areas targeted by this assay, and inadequate number of viral copies (<250 copies / mL). A negative result must be combined with clinical observations, patient history, and epidemiological information. Fact Sheet for Patients:   BoilerBrush.com.cy Fact Sheet for Healthcare Providers: https://pope.com/ This test is not yet approved or cleared  by the [place] FDA and has been authorized for detection and/or diagnosis of SARS-CoV-2 by FDA under an Emergency Use Authorization (EUA).  This EUA will remain in effect (meaning this test can be used) for the duration of the COVID-19 declaration under Section 564(b)(1) of the Act, 21 U.S.C. section 360bbb-3(b)(1), unless the authorization is terminated or revoked  sooner. Performed at [facility], [address] [place], [place] [address]     Coagulation Studies: Recent Labs    10-Dec-2019 1422  LABPROT 19.8*  INR 1.8*    Urinalysis: No results for input(s): COLORURINE, LABSPEC, PHURINE, GLUCOSEU, HGBUR, BILIRUBINUR, KETONESUR, PROTEINUR, UROBILINOGEN, NITRITE, LEUKOCYTESUR in the last 72 hours.  Invalid input(s): APPERANCEUR    Imaging: DG Chest Portable 1 View  Result Date: 12-10-19 CLINICAL DATA:  Increased jaundice, weakness, edema; history CHF, cirrhosis, hypertension, smoker, gout EXAM: PORTABLE CHEST 1 VIEW COMPARISON:  Portable exam 1442 hours compared to 10/06/2019 FINDINGS: Enlargement of cardiac silhouette. Mediastinal contours normal. Atherosclerotic calcification aorta. BILATERAL pulmonary infiltrates new since previous exam, could represent pulmonary edema or atypical infection. Subsegmental atelectasis LEFT base. No pleural effusion or pneumothorax. Bones demineralized. IMPRESSION: Enlargement of cardiac silhouette. New BILATERAL pulmonary infiltrates question pulmonary edema versus atypical infection. Electronically Signed   By: Ulyses Southward M.D.   On: Dec 10, 2019 15:07     Medications:   . sodium chloride 10 mL/hr at 12/03/19 0130  . sodium chloride    . sodium chloride 50 mL/hr at 12/03/19 1000  . albumin human 25 g (12/03/19 0612)  . cefTRIAXone (ROCEPHIN)  IV 2 g (12/03/19 0507)  . octreotide  (SANDOSTATIN)    IV infusion 50 mcg/hr (12/03/19 1000)  . pantoprozole (PROTONIX) infusion 8 mg/hr (12/03/19 1000)  . phenylephrine (NEO-SYNEPHRINE) Adult infusion 25 mcg/min (12/03/19 1000)  . vancomycin     . lactulose  30 g Oral BID  . mouth rinse  15 mL Mouth Rinse BID  . [START ON 12/06/2019] pantoprazole  40 mg Intravenous Q12H   sodium chloride, docusate sodium, polyethylene glycol  Assessment/ Plan:  Mr. Larry Andrade. is a 59 y.o. white male with cirrhosis, congestive heart failure, gout, GERD,  hypertension, pancreatitis, alcohol abuse who was admitted to [facility] on 2019-12-10 for Hepatic encephalopathy syndrome (HCC) [K72.90] Sepsis, due to unspecified organism, unspecified whether acute organ dysfunction present (HCC) [A41.9]  1. Acute renal failure on chronic kidney disease stage IIIA: baseline creatinine of 1.45, GFR of 53 on 4/14.  History of bland urine.  Chronic kidney disease secondary to hypertension Acute renal failure secondary to ATN Nonoliguric urine output. No acute indication for dialysis today.   2. Anion gap metabolic acidosis: with renal failure and liver failure and ongoing alcohol use  3. Hyponatremia: secondary to cirrhosis.   4. Anasarca with hypoalbuminemia  5. Anemia with renal failure: macrocytosis: hemoglobin 7.8 Recent GI bleed. Most likely again with GIB.   6. 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  Plan - Continue IV albumin 25g IV q8.      LOS: 1 Larry Andrade 6/7/202110:33 AM

## 2019-12-03 NOTE — Progress Notes (Signed)
[name], NP notified that patient has a condom cath in place and has not had any urine output this shift. Bladder scan shows . In/out cath drained of brown clear urine. Patient is also requesting his home pain meds, Oxycodone 10mg  tid.but due to lethargy, no order received at this time. Patient sleeping at present no signs of pain at present. Will continue to monitor

## 2019-12-03 NOTE — Progress Notes (Signed)
PHARMACY CONSULT NOTE - FOLLOW UP  Pharmacy Consult for Electrolyte Monitoring and Replacement   Recent Labs: Potassium (mmol/L)  Date Value  12/03/2019 4.4   Magnesium (mg/dL)  Date Value  34/62/1947 1.4 (L)   Calcium (mg/dL)  Date Value  12/52/7129 7.0 (L)   Calcium, Total (PTH) (mg/dL)  Date Value  29/02/300 9.4   Albumin (g/dL)  Date Value  49/96/9249 2.1 (L)   Phosphorus (mg/dL)  Date Value  32/41/9914 5.3 (H)   Sodium (mmol/L)  Date Value  12/03/2019 132 (L)     Assessment: Patient is a 59 y/o M with history of end stage liver disease, alcohol abuse, esophageal varices, atrial fibrillation, COPD who is admitted to the ICU with possible GIB, septic shock secondary to bacteremia, acute renal failure. Pharmacy has been consulted to assist with electrolyte repletion.  Goal of Therapy:  K 4 Mg 2 All other electrolytes WNL   Plan:  -Mg 1.4, will give IV magnesium 2 g x 1 -Follow-up electrolytes tomorrow morning  Tressie Ellis Pharmacy Resident 12/03/2019 11:37 AM

## 2019-12-03 NOTE — Progress Notes (Signed)
CRITICAL CARE NOTE 59 y.o.malewith medical history significant of alcohol abuse, alcoholic cirrhosis obesity esophageal varices, hypertension, COPD, GERD, gout, pancreatitis, atrial fibrillation on anticoagulants, CKD stage III, thrombocytopenia, tobacco abuse, who presents with hematemesis.  Patient has been having recurrent hematemesis but was not vomiting blood around mouth and chin.  Patient has abdominal distention and skin jaundice.  active smoking appx 1 pack daily.  He is jaundiced and in anasarca.  Critical care admission is requested due to encephalopathy, anasarca and possible GIB.    Lines / Drains: PIVx2  Cultures / Sepsis markers: Blood and urine  Antibiotics: Rocephin   Protocols / Consultants: GI, PCCM  6/6 admitted for active GIB from liver cirrhosis with encephalopathy, septic shock 6/7 MRSA bacteremia and STEP PNEUMONIA bacteramia   BMP Latest Ref Rng & Units 12/03/2019 12/08/2019 11/22/2019  Glucose 70 - 99 mg/dL 182(H) 144(H) 153(H)  BUN 6 - 20 mg/dL 55(H) 51(H) 33(H)  Creatinine 0.61 - 1.24 mg/dL 2.32(H) 1.97(H) 1.28(H)  Sodium 135 - 145 mmol/L 132(L) 134(L) 135  Potassium 3.5 - 5.1 mmol/L 4.4 4.5 3.7  Chloride 98 - 111 mmol/L 95(L) 95(L) 99  CO2 22 - 32 mmol/L 20(L) 22 27  Calcium 8.9 - 10.3 mg/dL 7.0(L) 7.4(L) 7.9(L)   CBC    Component Value Date/Time   WBC 24.2 (H) 12/03/2019 0114   RBC 2.26 (L) 12/03/2019 0114   HGB 7.8 (L) 12/03/2019 0719   HCT 23.6 (L) 12/03/2019 0719   PLT 124 (L) 12/03/2019 0114   MCV 108.4 (H) 12/03/2019 0114   MCH 35.4 (H) 12/03/2019 0114   MCHC 32.7 12/03/2019 0114   RDW 21.1 (H) 12/03/2019 0114   LYMPHSABS 2.3 10/06/2019 1858   MONOABS 0.7 10/06/2019 1858   EOSABS 0.2 10/06/2019 1858   BASOSABS 0.1 10/06/2019 1858       CC  follow up shock, mental status changes  SUBJECTIVE Patient remains critically ill Prognosis is guarded Multiorgan failure Liver failure, renal failure On pressors High risk for  intubation and cardiac arrest    BP 96/67   Pulse (!) 118   Temp 98 F (36.7 C) (Oral)   Resp (!) 9   Ht 6\' 2"  (1.88 m)   Wt 96.8 kg   SpO2 99%   BMI 27.40 kg/m    I/O last 3 completed shifts: In: 985.5 [I.V.:735.6; IV Piggyback:249.8] Out: 1610 [Urine:425; RUEAV:4098] No intake/output data recorded.  SpO2: 99 % O2 Flow Rate (L/min): 4 L/min  Estimated body mass index is 27.4 kg/m as calculated from the following:   Height as of this encounter: 6\' 2"  (1.88 m).   Weight as of this encounter: 96.8 kg.  SIGNIFICANT EVENTS   REVIEW OF SYSTEMS  PATIENT IS UNABLE TO PROVIDE COMPLETE REVIEW OF SYSTEMS DUE TO SEVERE CRITICAL ILLNESS        PHYSICAL EXAMINATION:  GENERAL:critically ill appearing, + HEAD: Normocephalic, atraumatic.  EYES: Pupils equal, round, reactive to light.  + scleral icterus.  MOUTH: Moist mucosal membrane. NECK: Supple.  PULMONARY: +rhonchi,  CARDIOVASCULAR: S1 and S2. Regular rate and rhythm. No murmurs, rubs, or gallops.  GASTROINTESTINAL: +distended.  NEG bowel sounds.   MUSCULOSKELETAL: +edema.  NEUROLOGIC: obtunded SKIN:intact,warm,dry  MEDICATIONS: I have reviewed all medications and confirmed regimen as documented   CULTURE RESULTS   Recent Results (from the past 240 hour(s))  Culture, blood (routine x 2)     Status: None (Preliminary result)   Collection Time: 12/22/2019  2:22 PM   Specimen: BLOOD  Result  Value Ref Range Status   Specimen Description BLOOD LEFT ANTECUBITAL  Final   Special Requests   Final    BOTTLES DRAWN AEROBIC AND ANAEROBIC Blood Culture adequate volume   Culture  Setup Time   Final    Organism ID to follow GRAM POSITIVE COCCI IN BOTH AEROBIC AND ANAEROBIC BOTTLES CRITICAL RESULT CALLED TO, READ BACK BY AND VERIFIED WITH: DAVID BESANTI 12/03/19 AT 0105 HS Performed at [facility], [address] [place], [place] [address]    Culture GRAM POSITIVE COCCI  Final   Report Status PENDING   Incomplete  Blood Culture ID Panel (Reflexed)     Status: Abnormal   Collection Time: 12/06/2019  2:22 PM  Result Value Ref Range Status   Enterococcus species NOT DETECTED NOT DETECTED Final   Listeria monocytogenes NOT DETECTED NOT DETECTED Final   Staphylococcus species DETECTED (A) NOT DETECTED Final    Comment: CRITICAL RESULT CALLED TO, READ BACK BY AND VERIFIED WITH: DAVID BESANTI 12/03/19 AT 0105 HS    Staphylococcus aureus (BCID) DETECTED (A) NOT DETECTED Final    Comment: Methicillin (oxacillin)-resistant Staphylococcus aureus (MRSA). MRSA is predictably resistant to beta-lactam antibiotics (except ceftaroline). Preferred therapy is vancomycin unless clinically contraindicated. Patient requires contact precautions if  hospitalized. CRITICAL RESULT CALLED TO, READ BACK BY AND VERIFIED WITH: DAVID BESANTI 12/03/19 AT 0105 HS    Methicillin resistance DETECTED (A) NOT DETECTED Final    Comment: CRITICAL RESULT CALLED TO, READ BACK BY AND VERIFIED WITH: DAVID BESANTI 12/03/19 AT 0105 HS    Streptococcus species DETECTED (A) NOT DETECTED Final    Comment: CRITICAL RESULT CALLED TO, READ BACK BY AND VERIFIED WITH: DAVID BESANTI 12/03/19 AT 0105 HS    Streptococcus agalactiae NOT DETECTED NOT DETECTED Final   Streptococcus pneumoniae DETECTED (A) NOT DETECTED Final    Comment: CRITICAL RESULT CALLED TO, READ BACK BY AND VERIFIED WITH: DAVID BESANTI 12/03/19 AT 0105 HS    Streptococcus pyogenes NOT DETECTED NOT DETECTED Final   Acinetobacter baumannii NOT DETECTED NOT DETECTED Final   Enterobacteriaceae species NOT DETECTED NOT DETECTED Final   Enterobacter cloacae complex NOT DETECTED NOT DETECTED Final   Escherichia coli NOT DETECTED NOT DETECTED Final   Klebsiella oxytoca NOT DETECTED NOT DETECTED Final   Klebsiella pneumoniae NOT DETECTED NOT DETECTED Final   Proteus species NOT DETECTED NOT DETECTED Final   Serratia marcescens NOT DETECTED NOT DETECTED Final   Haemophilus influenzae  NOT DETECTED NOT DETECTED Final   Neisseria meningitidis NOT DETECTED NOT DETECTED Final   Pseudomonas aeruginosa NOT DETECTED NOT DETECTED Final   Candida albicans NOT DETECTED NOT DETECTED Final   Candida glabrata NOT DETECTED NOT DETECTED Final   Candida krusei NOT DETECTED NOT DETECTED Final   Candida parapsilosis NOT DETECTED NOT DETECTED Final   Candida tropicalis NOT DETECTED NOT DETECTED Final    Comment: Performed at [facility], [address] [place], [place] [address]  Culture, blood (routine x 2)     Status: None (Preliminary result)   Collection Time: 12/24/2019  3:46 PM   Specimen: BLOOD  Result Value Ref Range Status   Specimen Description BLOOD LEFT ANTECUBITAL  Final   Special Requests   Final    BOTTLES DRAWN AEROBIC AND ANAEROBIC Blood Culture results may not be optimal due to an excessive volume of blood received in culture bottles   Culture  Setup Time   Final    GRAM POSITIVE COCCI IN BOTH AEROBIC AND ANAEROBIC BOTTLES  CRITICAL VALUE NOTED.  VALUE IS CONSISTENT WITH PREVIOUSLY REPORTED AND CALLED VALUE. Performed at [facility], [address] [place], [place] [address]    Culture [facility] POSITIVE COCCI  Final   Report Status PENDING  Incomplete  SARS Coronavirus 2 by RT PCR (hospital order, performed in [facility] hospital lab) Nasopharyngeal Nasopharyngeal Swab     Status: None   Collection Time: 11/29/2019  3:46 PM   Specimen: Nasopharyngeal Swab  Result Value Ref Range Status   SARS Coronavirus 2 NEGATIVE NEGATIVE Final    Comment: (NOTE) SARS-CoV-2 target nucleic acids are NOT DETECTED. The SARS-CoV-2 RNA is generally detectable in upper and lower respiratory specimens during the acute phase of infection. The lowest concentration of SARS-CoV-2 viral copies this assay can detect is 250 copies / mL. A negative result does not preclude SARS-CoV-2 infection and should not be used as the sole basis for treatment or other patient management  decisions.  A negative result may occur with improper specimen collection / handling, submission of specimen other than nasopharyngeal swab, presence of viral mutation(s) within the areas targeted by this assay, and inadequate number of viral copies (<250 copies / mL). A negative result must be combined with clinical observations, patient history, and epidemiological information. Fact Sheet for Patients:   BoilerBrush.com.cy Fact Sheet for Healthcare Providers: https://pope.com/ This test is not yet approved or cleared  by the [place] FDA and has been authorized for detection and/or diagnosis of SARS-CoV-2 by FDA under an Emergency Use Authorization (EUA).  This EUA will remain in effect (meaning this test can be used) for the duration of the COVID-19 declaration under Section 564(b)(1) of the Act, 21 U.S.C. section 360bbb-3(b)(1), unless the authorization is terminated or revoked sooner. Performed at [facility], [address] [place], [place] [address]           IMAGING    DG Chest Portable 1 View  Result Date: 12/25/2019 CLINICAL DATA:  Increased jaundice, weakness, edema; history CHF, cirrhosis, hypertension, smoker, gout EXAM: PORTABLE CHEST 1 VIEW COMPARISON:  Portable exam 1442 hours compared to 10/06/2019 FINDINGS: Enlargement of cardiac silhouette. Mediastinal contours normal. Atherosclerotic calcification aorta. BILATERAL pulmonary infiltrates new since previous exam, could represent pulmonary edema or atypical infection. Subsegmental atelectasis LEFT base. No pleural effusion or pneumothorax. Bones demineralized. IMPRESSION: Enlargement of cardiac silhouette. New BILATERAL pulmonary infiltrates question pulmonary edema versus atypical infection. Electronically Signed   By: Ulyses Southward M.D.   On: 12/17/2019 15:07     Nutrition Status:           Indwelling Urinary Catheter continued, requirement due to    Reason to continue Indwelling Urinary Catheter strict Intake/Output monitoring for hemodynamic instability   Central Line/ continued, requirement due to  Reason to continue [employer] Monitoring of central venous pressure or other hemodynamic parameters and poor IV access      ASSESSMENT AND PLAN SYNOPSIS 59 yo admitted for active GIB from liver cirrhosis with encephalopathy, septic shock with multiorgan failure and septic shock +MRSA AND STREP PNEUMONIA  Septic shock -use vasopressors to keep MAP>65 -follow ABG and LA -follow up cultures -emperic ABX -consider stress dose steroids -aggressive IV fluid Resuscitation    Morbid obesity, possible OSA.   Will certainly impact respiratory mechanics   ACUTE KIDNEY INJURY/Renal Failure -continue Foley Catheter-assess need -Avoid nephrotoxic agents -Follow urine output, BMP -Ensure adequate renal perfusion, optimize oxygenation -Renal dose medications     NEUROLOGY Severe encephalopathy from sepsis and ETOH  ABUSE High risk for intubation    Acute toxic metabolic encephalopathy  SHOCK-SEPSIS/HYPOVOLUMIC -use vasopressors to keep MAP>65 -follow ABG and LA -follow up cultures -emperic ABX -consider stress dose steroids -aggressive IV fluid resuscitation  CARDIAC ICU monitoring  ID -continue IV abx as prescibed -follow up cultures  GI GI PROPHYLAXIS as indicated  NUTRITIONAL STATUS Nutrition Status:         DIET--> as tolerated Constipation protocol as indicated  ENDO - will use ICU hypoglycemic\Hyperglycemia protocol if indicated     ELECTROLYTES -follow labs as needed -replace as needed -pharmacy consultation and following   DVT/GI PRX ordered and assessed TRANSFUSIONS AS NEEDED MONITOR FSBS I Assessed the need for Labs I Assessed the need for Foley I Assessed the need for Central Venous Line Family Discussion when available I Assessed the need for Mobilization I made an Assessment  of medications to be adjusted accordingly Safety Risk assessment completed   CASE DISCUSSED IN MULTIDISCIPLINARY ROUNDS WITH ICU TEAM  Critical Care Time devoted to patient care services described in this note is 35 minutes.   Overall, patient is critically ill, prognosis is guarded.  Patient with Multiorgan failure and at high risk for cardiac arrest and death.    Lucie Leather, M.D.  [facility]  Medical Director [facility] Medical Director [facility] Cardio-Pulmonary Department

## 2019-12-03 NOTE — Progress Notes (Signed)
*  PRELIMINARY RESULTS* Echocardiogram 2D Echocardiogram has been performed.  Larry Andrade 12/03/2019, 7:00 PM 

## 2019-12-03 NOTE — Consult Note (Signed)
[name], MD [address]  [place]  [place], [place] [address]  Main: [phone]  Fax: [phone] Pager: [phone]   Consultation  Referring Provider:     No ref. provider found Primary Care Physician:  Rutherford Limerick, PA Primary Gastroenterologist:  Dr. Vicente Males         Reason for Consultation:     Worsening anemia, GI bleed  Date of Admission:  12/24/2019 Date of Consultation:  12/03/2019         HPI:   Larry Andrade. is a 59 y.o. male known history of decompensated alcoholic cirrhosis, followed by [facility] hepatology, ongoing alcohol abuse, stage III CKD, hepatic encephalopathy.  Diastolic heart failure, paroxysmal A. fib had several admissions in the past secondary to acute on chronic cirrhosis of liver, alcoholic hepatitis and GI bleed.  Most recent admission was in 10/2019 secondary to worsening anemia, patient had several upper endoscopies in the past, most recently on 5/24 revealed erosive esophagitis, moderate portal hypertensive gastropathy.  He also had small bowel endoscopy which revealed small bowel erosions only.  He was also discharged on oral methylprednisolone 40 mg daily for 4 weeks on 5/27 with follow-up at [facility].  Patient presented to ER yesterday due to increased jaundice, weakness, edema. Patient presented again with recurrent episodes of hematemesis with dried blood around mouth and chin.  He also smokes 1 pack/day.  He is jaundiced as well as in anasarca.  Patient is admitted to ICU for possible septic shock.  Patient is currently on broad-spectrum antibiotics, octreotide drip, pantoprazole drip, Levophed, albumin.  He also has worsening renal function.  Per ICU nurse, patient did not have further episodes of hematemesis since admission.  Palliative care is consulted. Patient is minimally verbal when I saw him, spoke to his nurse Patient is currently positive for MRSA bacteremia as well as Streptococcus pneumonia bacteremia.  Also found to have  bilateral pulmonary infiltrates  Patient is made DNR/DNI by the ICU team after discussion with his family   Past Medical History:  Diagnosis Date  . CHF (congestive heart failure) ([place])   . Cirrhosis ([place])   . GERD (gastroesophageal reflux disease)   . Gout   . Hypertension   . Pancreatitis     Past Surgical History:  Procedure Laterality Date  . CARDIAC CATHETERIZATION    . COLON SURGERY    . ESOPHAGOGASTRODUODENOSCOPY N/A 04/08/2019   Procedure: ESOPHAGOGASTRODUODENOSCOPY (EGD);  Surgeon: Jonathon Bellows, MD;  Location: [facility] ENDOSCOPY;  Service: Gastroenterology;  Laterality: N/A;  . ESOPHAGOGASTRODUODENOSCOPY N/A 10/09/2019   Procedure: ESOPHAGOGASTRODUODENOSCOPY (EGD);  Surgeon: Lin Landsman, MD;  Location: [facility] ENDOSCOPY;  Service: Gastroenterology;  Laterality: N/A;  . ESOPHAGOGASTRODUODENOSCOPY N/A 11/19/2019   Procedure: ESOPHAGOGASTRODUODENOSCOPY (EGD);  Surgeon: Toledo, Benay Pike, MD;  Location: [facility] ENDOSCOPY;  Service: Gastroenterology;  Laterality: N/A;  . ESOPHAGOGASTRODUODENOSCOPY (EGD) WITH PROPOFOL N/A 10/26/2017   Procedure: ESOPHAGOGASTRODUODENOSCOPY (EGD) WITH PROPOFOL;  Surgeon: Jonathon Bellows, MD;  Location: [facility] ENDOSCOPY;  Service: Endoscopy;  Laterality: N/A;  . FLEXIBLE SIGMOIDOSCOPY N/A 04/08/2019   Procedure: FLEXIBLE SIGMOIDOSCOPY;  Surgeon: Jonathon Bellows, MD;  Location: [facility] ENDOSCOPY;  Service: Gastroenterology;  Laterality: N/A;  . GIVENS CAPSULE STUDY  10/09/2019   Procedure: GIVENS CAPSULE STUDY;  Surgeon: Lin Landsman, MD;  Location: [facility] ENDOSCOPY;  Service: Gastroenterology;;  . I & D EXTREMITY Right 09/05/2018   Procedure: IRRIGATION AND DEBRIDEMENT EXTREMITY;  Surgeon: Thornton Park, MD;  Location: [facility] ORS;  Service: Orthopedics;  Laterality: Right;  .  KNEE ARTHROSCOPY Right 08/26/2018   Procedure: ARTHROSCOPY KNEE;  Surgeon: Thornton Park, MD;  Location: [facility] ORS;  Service: Orthopedics;  Laterality: Right;    Prior to Admission  medications   Medication Sig Start Date End Date Taking? Authorizing Provider  allopurinol (ZYLOPRIM) 100 MG tablet Take 100 mg by mouth 2 (two) times daily.    Yes [provider]  amiodarone (PACERONE) 200 MG tablet Take 1 tablet (200 mg total) by mouth daily. 04/09/19  Yes Max Sane, MD  folic acid (FOLVITE) 1 MG tablet Take 1 tablet (1 mg total) by mouth daily. 11/22/19  Yes Dhungel, Nishant, MD  furosemide (LASIX) 40 MG tablet Take 40 mg by mouth every morning. 09/27/19  Yes [provider]  methylPREDNISolone (MEDROL) 8 MG tablet Take 5 tablets (40 mg total) by mouth daily for 28 days. 11/22/19 12/20/19 Yes Dhungel, Nishant, MD  metoprolol succinate (TOPROL-XL) 25 MG 24 hr tablet Take 25 mg by mouth 2 (two) times daily. 01/15/19  Yes [provider]  Oxycodone HCl 10 MG TABS Take 10 mg by mouth 3 (three) times daily as needed. 10/25/19  Yes [provider]  pantoprazole (PROTONIX) 40 MG tablet Take 1 tablet (40 mg total) by mouth daily. 10/10/19 10/09/20 Yes Wieting, Richard, MD  thiamine 100 MG tablet Take 1 tablet (100 mg total) by mouth daily. 10/11/19  Yes Wieting, Richard, MD  albuterol (PROVENTIL) (2.5 MG/3ML) 0.083% nebulizer solution Take 2.5 mg by nebulization every 6 (six) hours as needed for wheezing or shortness of breath.    [provider]  albuterol (VENTOLIN HFA) 108 (90 Base) MCG/ACT inhaler Inhale into the lungs every 6 (six) hours as needed for wheezing or shortness of breath.    [provider]  colchicine 0.6 MG tablet Take 0.6 mg by mouth as needed.    [provider]  nicotine (NICODERM CQ - DOSED IN MG/24 HOURS) 21 mg/24hr patch Place 1 patch (21 mg total) onto the skin daily. 11/23/19   Dhungel, Flonnie Overman, MD    Current Facility-Administered Medications:  .  0.9 %  sodium chloride infusion, , Intravenous, PRN, Darel Hong D, NP, Last Rate: 10 mL/hr at 12/03/19 0130, Rate Verify at 12/03/19 0130 .  0.9 %  sodium  chloride infusion, 250 mL, Intravenous, Continuous, Darel Hong D, NP .  0.9 %  sodium chloride infusion, , Intravenous, Continuous, Darel Hong D, NP, Last Rate: 50 mL/hr at 12/03/19 1200, Rate Verify at 12/03/19 1200 .  albumin human 25 % solution 25 g, 25 g, Intravenous, Q8H, Kolluru, Sarath, MD, Last Rate: 27 mL/hr at 12/03/19 1310, 25 g at 12/03/19 1310 .  allopurinol (ZYLOPRIM) tablet 100 mg, 100 mg, Oral, BID, Kasa, Kurian, MD, 100 mg at 12/03/19 1123 .  amiodarone (PACERONE) tablet 200 mg, 200 mg, Oral, Daily, Kasa, Kurian, MD, 200 mg at 12/03/19 1123 .  cefTRIAXone (ROCEPHIN) 2 g in sodium chloride 0.9 % 100 mL IVPB, 2 g, Intravenous, Q24H, Bradly Bienenstock, NP, Stopped at 12/03/19 1311 .  docusate sodium (COLACE) capsule 100 mg, 100 mg, Oral, BID PRN, Lanney Gins, Fuad, MD .  folic acid (FOLVITE) tablet 1 mg, 1 mg, Oral, Daily, Kasa, Kurian, MD, 1 mg at 12/03/19 1123 .  lactulose (CHRONULAC) 10 GM/15ML solution 30 g, 30 g, Oral, BID, Kasa, Kurian, MD, 30 g at 12/03/19 1033 .  MEDLINE mouth rinse, 15 mL, Mouth Rinse, BID, Darel Hong D, NP, 15 mL at 12/03/19 0432 .  metoprolol succinate (TOPROL-XL) 24 hr  tablet 25 mg, 25 mg, Oral, BID, Kasa, Kurian, MD, 25 mg at 12/03/19 1123 .  nicotine (NICODERM CQ - dosed in mg/24 hours) patch 21 mg, 21 mg, Transdermal, Daily, Kasa, Kurian, MD, 21 mg at 12/03/19 1124 .  octreotide (SANDOSTATIN) 500 mcg in sodium chloride 0.9 % 250 mL (2 mcg/mL) infusion, 50 mcg/hr, Intravenous, Continuous, Aleskerov, Fuad, MD, Last Rate: 25 mL/hr at 12/03/19 1200, 50 mcg/hr at 12/03/19 1200 .  oxyCODONE (Oxy IR/ROXICODONE) immediate release tablet 10 mg, 10 mg, Oral, TID PRN, Flora Lipps, MD, 10 mg at 12/03/19 1123 .  pantoprazole (PROTONIX) 80 mg in sodium chloride 0.9 % 100 mL (0.8 mg/mL) infusion, 8 mg/hr, Intravenous, Continuous, Darel Hong D, NP, Last Rate: 10 mL/hr at 12/03/19 1200, 8 mg/hr at 12/03/19 1200 .  [START ON 12/06/2019] pantoprazole  (PROTONIX) injection 40 mg, 40 mg, Intravenous, Q12H, Darel Hong D, NP .  phenylephrine (NEO-SYNEPHRINE) 10 mg in sodium chloride 0.9 % 250 mL (0.04 mg/mL) infusion, 25-200 mcg/min, Intravenous, Titrated, Darel Hong D, NP, Last Rate: 37.5 mL/hr at 12/03/19 1200, 25 mcg/min at 12/03/19 1200 .  polyethylene glycol (MIRALAX / GLYCOLAX) packet 17 g, 17 g, Oral, Daily PRN, Lanney Gins, Fuad, MD .  thiamine tablet 100 mg, 100 mg, Oral, Daily, Kasa, Kurian, MD, 100 mg at 12/03/19 1123 .  vancomycin (VANCOREADY) IVPB 1500 mg/300 mL, 1,500 mg, Intravenous, Q24H, Bradly Bienenstock, NP  Family History  Problem Relation Age of Onset  . CVA Mother   . CAD Mother   . CAD Father      Social History   Tobacco Use  . Smoking status: Current Every Day Smoker    Packs/day: 1.00    Types: Cigarettes  . Smokeless tobacco: Never Used  Substance Use Topics  . Alcohol use: Yes    Comment: 1 pint of vodka a day  . Drug use: No    Allergies as of 12/23/2019 - Review Complete 12/11/2019  Allergen Reaction Noted  . Chlorhexidine  10/07/2019  . Penicillins Rash 02/22/2017    Review of Systems:    All systems reviewed and negative except where noted in HPI.   Physical Exam:  Vital signs in last 24 hours: Temp:  [97.4 F (36.3 C)-98.7 F (37.1 C)] 98 F (36.7 C) (06/07 1200) Pulse Rate:  [66-141] 113 (06/07 1300) Resp:  [9-24] 12 (06/07 1300) BP: (67-109)/(53-92) 85/71 (06/07 1300) SpO2:  [90 %-100 %] 95 % (06/07 1300) Weight:  [90.7 kg-96.8 kg] 96.8 kg (06/07 0500) Last BM Date: 12/03/19 General: Not in distress, ill-appearing Head:  Normocephalic and atraumatic, cachectic, bitemporal wasting. Eyes:   Deep icterus.   Conjunctiva pale. PERRLA. Ears:  Normal auditory acuity. Neck:  Supple; no masses or thyroidomegaly Lungs: Respirations even and unlabored.  Crackles bilateral Heart: Tachycardia;  Without murmur, clicks, rubs or gallops Abdomen:  Soft, distended, nontender. Normal bowel  sounds. No appreciable masses or hepatomegaly.  No rebound or guarding.  Rectal:  Not performed. Msk:  Symmetrical without gross deformities. Extremities:  3+edema, cyanosis or clubbing. Neurologic:  Alert and oriented x1 Skin:  Intact without significant lesions or rashes. Psych:  Alert and cooperative. Normal affect.  LAB RESULTS: CBC Latest Ref Rng & Units 12/03/2019 12/03/2019 12/03/2019  WBC 4.0 - 10.5 K/uL - - 24.2(H)  Hemoglobin 13.0 - 17.0 g/dL 7.1(L) 7.8(L) 8.0(L)  Hematocrit 39.0 - 52.0 % 21.5(L) 23.6(L) 24.5(L)  Platelets 150 - 400 K/uL - - 124(L)    BMET BMP Latest Ref Rng & Units  12/03/2019 12/22/2019 11/22/2019  Glucose 70 - 99 mg/dL 182(H) 144(H) 153(H)  BUN 6 - 20 mg/dL 55(H) 51(H) 33(H)  Creatinine 0.61 - 1.24 mg/dL 2.32(H) 1.97(H) 1.28(H)  Sodium 135 - 145 mmol/L 132(L) 134(L) 135  Potassium 3.5 - 5.1 mmol/L 4.4 4.5 3.7  Chloride 98 - 111 mmol/L 95(L) 95(L) 99  CO2 22 - 32 mmol/L 20(L) 22 27  Calcium 8.9 - 10.3 mg/dL 7.0(L) 7.4(L) 7.9(L)    LFT Hepatic Function Latest Ref Rng & Units 12/24/2019 11/22/2019 11/21/2019  Total Protein 6.5 - 8.1 g/dL 6.1(L) 6.8 6.5  Albumin 3.5 - 5.0 g/dL 2.1(L) 2.2(L) 2.0(L)  AST 15 - 41 U/L 61(H) 160(H) 125(H)  ALT 0 - 44 U/L 44 57(H) 40  Alk Phosphatase 38 - 126 U/L 119 189(H) 167(H)  Total Bilirubin 0.3 - 1.2 mg/dL 8.8(H) 8.9(H) 9.7(H)  Bilirubin, Direct 0.0 - 0.2 mg/dL - - -     STUDIES: DG Chest Portable 1 View  Result Date: 12/19/2019 CLINICAL DATA:  Increased jaundice, weakness, edema; history CHF, cirrhosis, hypertension, smoker, gout EXAM: PORTABLE CHEST 1 VIEW COMPARISON:  Portable exam 1442 hours compared to 10/06/2019 FINDINGS: Enlargement of cardiac silhouette. Mediastinal contours normal. Atherosclerotic calcification aorta. BILATERAL pulmonary infiltrates new since previous exam, could represent pulmonary edema or atypical infection. Subsegmental atelectasis LEFT base. No pleural effusion or pneumothorax. Bones demineralized.  IMPRESSION: Enlargement of cardiac silhouette. New BILATERAL pulmonary infiltrates question pulmonary edema versus atypical infection. Electronically Signed   By: Lavonia Dana M.D.   On: 11/27/2019 15:07      Impression / Plan:   Larry Cerny. is a 58 y.o. male with decompensated alcoholic cirrhosis, chronic alcohol and tobacco use, history of erosive esophagitis, portal hypertensive gastropathy, with recurrent admissions for acute alcoholic hepatitis, most recently treated with methylprednisolone admitted with septic shock secondary to MRSA bacteremia, bilateral pneumonia and Streptococcus pneumonia bacteremia.  GI is consulted for hematemesis  Hematemesis: Known history of erosive esophagitis and portal hypertensive gastropathy which are likely the source of blood loss.  Patient already had several endoscopies as well as small bowel capsule within last 2 months.  Therefore, yield of repeating upper endoscopy to find an alternative etiology will be low Agree with pantoprazole drip Monitor CBC closely and transfuse to maintain hemoglobin greater than 7 Agree to continue octreotide drip  Septic shock secondary to pneumonia and MRSA bacteremia Currently on broad-spectrum antibiotics which will also cover SBP prophylaxis Currently on pressor support Management per the ICU team  Stage III CKD, AKI on CKD Nephrology is following  Hepatic encephalopathy Continue lactulose and can add rifaximin if needed  Alcohol abuse Continue multivitamin with folic acid plus thiamine  Overall prognosis is guarded with multiorgan failure, ongoing alcohol abuse, decompensated cirrhosis, septic shock Agree with palliative care team on board Patient is not a transplant candidate due to ongoing alcohol abuse  Thank you for involving me in the care of this patient.      LOS: 1 day   Sherri Sear, MD  12/03/2019, 3:01 PM   Note: This dictation was prepared with Dragon dictation along with smaller phrase  technology. Any transcriptional errors that result from this process are unintentional.

## 2019-12-03 NOTE — Progress Notes (Signed)
Pharmacy BCID Antibiotic Note  Larry Andrade. is a 59 y.o. male w/ h/o CHF, pancreatitis, cirrhosis s/t EtOH abuse, esophageal varices s/p EGD and multiple endoscopies and flex sigmoidoscopy admitted on 12/10/2019 with hematemesis and sepsis meeting 3/4 (tachycardic, tachypneic, neutrophilia) w/ elevated lactate 7.2 >> 5.9, PCT 7.02 (in AKI w/ Scr currently 1.97 baseline 1.1 but has been getting worse, no documented or diagnosed CKD).    Patient was previous vomiting blood but no during H/P. Patient had blood cultures result showing: 4/4 GPC with the BCID speciating: staph species staphylococcus aureus Mec A+ (MRSA) AND Streptococcus pneumoniae.  Spoke to [employer] on call Harlon Ditty about patient who is currently on azithromycin/ceftriaxone.  Recommendations were accepted by provider to discontinue azithromycin and start vancomycin and continue ceftriaxone 2g IV daily.  Plan: Patient originally had received vanc 2g IV load in ED.  Will restart patient back on vancomycin 1.5g IV q24h and will check a trough level prior to 4th dose on 06/10 @ 1500. Placing patient on scheduled regimen even in light of AKI on possibly developing CKD concerning severity of infection.  Ke [id number] T1/2 calculated 15.5 hrs ~ 24 hours CrCl 48.7 ml/min Goal trough 15 - 20 mcg/mL  Will continue to monitor renal function and clinical course and will plan to readjust vancomycin regimen per changing renal function.  Height: 6\' 2"  (188 cm) Weight: 96.5 kg (212 lb 11.9 oz) IBW/kg (Calculated) : 82.2  Temp (24hrs), Avg:98.7 F (37.1 C), Min:97.4 F (36.3 C), Max:100.1 F (37.8 C)  Recent Labs  Lab 12/08/2019 1422 12/12/2019 1721  WBC 23.2*  --   CREATININE 1.97*  --   LATICACIDVEN 7.2* 5.9*    Estimated Creatinine Clearance: 48.7 mL/min (A) (by C-G formula based on SCr of 1.97 mg/dL (H)).    Allergies  Allergen Reactions  . Chlorhexidine     No  midline present  . Penicillins Rash    Has patient had a PCN  reaction causing immediate rash, facial/tongue/throat swelling, SOB or lightheadedness with hypotension: No Has patient had a PCN reaction causing severe rash involving mucus membranes or skin necrosis: No Has patient had a PCN reaction that required hospitalization: No Has patient had a PCN reaction occurring within the last 10 years: No If all of the above answers are "NO", then may proceed with Cephalosporin use.     Antimicrobials this admission: 06/06 azithro >> 06/06 06/06 CTX >>  06/06 CFP >> 06/06 06/06 vanc load >> 0606 then to restart 06/07 >>   Dose adjustments this admission: Vancomycin to be adjusted per renal fx  Microbiology results: 06/06 BCx: MRSA & strep pneumonia 4/4 (not sure how many bottles per organism)  Thank you for allowing pharmacy to be a part of this patient's care.  6/4, PharmD, BCPS Clinical Pharmacist 12/03/2019 1:26 AM

## 2019-12-03 NOTE — Progress Notes (Signed)
The Clinical status was relayed to family(Mother Windell Moulding) in detail.  Updated and notified of patients medical condition.  Patient remains unresponsive and will not open eyes to command.   Upon assessment his breath sounds are course crackles with significant secretions to his oral pharyngeal region.  Explained to family course of therapy and the modalities     Patient with Progressive multiorgan failure with very low chance of meaningful recovery despite all aggressive and optimal medical therapy.  Patient is in the dying Process associated with suffering.  Mother  understands the situation.  They have consented and agreed to DNR/DNI   Family are satisfied with Plan of action and management. All questions answered  Additional CC time 32 mins   Katelinn Justice Santiago Glad, M.D.  [facility]  Medical Director [facility] Medical Director [facility]

## 2019-12-04 ENCOUNTER — Inpatient Hospital Stay: Payer: Medicare Other

## 2019-12-04 LAB — HEMOGLOBIN AND HEMATOCRIT, BLOOD
HCT: 20.9 % — ABNORMAL LOW (ref 39.0–52.0)
Hemoglobin: 6.7 g/dL — ABNORMAL LOW (ref 13.0–17.0)

## 2019-12-04 LAB — COMPREHENSIVE METABOLIC PANEL
ALT: 32 U/L (ref 0–44)
AST: 43 U/L — ABNORMAL HIGH (ref 15–41)
Albumin: 2.4 g/dL — ABNORMAL LOW (ref 3.5–5.0)
Alkaline Phosphatase: 96 U/L (ref 38–126)
Anion gap: 14 (ref 5–15)
BUN: 69 mg/dL — ABNORMAL HIGH (ref 6–20)
CO2: 22 mmol/L (ref 22–32)
Calcium: 7 mg/dL — ABNORMAL LOW (ref 8.9–10.3)
Chloride: 96 mmol/L — ABNORMAL LOW (ref 98–111)
Creatinine, Ser: 2.72 mg/dL — ABNORMAL HIGH (ref 0.61–1.24)
GFR calc Af Amer: 29 mL/min — ABNORMAL LOW (ref >60)
GFR calc non Af Amer: 25 mL/min — ABNORMAL LOW (ref >60)
Glucose, Bld: 113 mg/dL — ABNORMAL HIGH (ref 70–99)
Potassium: 4 mmol/L (ref 3.5–5.1)
Sodium: 132 mmol/L — ABNORMAL LOW (ref 135–145)
Total Bilirubin: 6 mg/dL — ABNORMAL HIGH (ref 0.3–1.2)
Total Protein: 5.6 g/dL — ABNORMAL LOW (ref 6.5–8.1)

## 2019-12-04 LAB — CBC
HCT: 20.8 % — ABNORMAL LOW (ref 39.0–52.0)
Hemoglobin: 7.1 g/dL — ABNORMAL LOW (ref 13.0–17.0)
MCH: 35.3 pg — ABNORMAL HIGH (ref 26.0–34.0)
MCHC: 34.1 g/dL (ref 30.0–36.0)
MCV: 103.5 fL — ABNORMAL HIGH (ref 80.0–100.0)
Platelets: 93 10*3/uL — ABNORMAL LOW (ref 150–400)
RBC: 2.01 MIL/uL — ABNORMAL LOW (ref 4.22–5.81)
RDW: 20.6 % — ABNORMAL HIGH (ref 11.5–15.5)
WBC: 19.6 10*3/uL — ABNORMAL HIGH (ref 4.0–10.5)
nRBC: 0.2 % (ref 0.0–0.2)

## 2019-12-04 LAB — PHOSPHORUS: Phosphorus: 3.8 mg/dL (ref 2.5–4.6)

## 2019-12-04 LAB — URINE CULTURE: Culture: NO GROWTH

## 2019-12-04 LAB — PROCALCITONIN: Procalcitonin: 15.52 ng/mL

## 2019-12-04 LAB — PREPARE RBC (CROSSMATCH)

## 2019-12-04 LAB — GLUCOSE, CAPILLARY: Glucose-Capillary: 131 mg/dL — ABNORMAL HIGH (ref 70–99)

## 2019-12-04 LAB — MAGNESIUM: Magnesium: 1.7 mg/dL (ref 1.7–2.4)

## 2019-12-04 LAB — ECHOCARDIOGRAM COMPLETE
Height: 74 in
Weight: 3414.48 oz

## 2019-12-04 MED ORDER — MORPHINE SULFATE (PF) 2 MG/ML IV SOLN
1.0000 mg | INTRAVENOUS | Status: DC | PRN
  Administered 2019-12-05 – 2019-12-09 (×10): 2 mg via INTRAVENOUS
  Administered 2019-12-09: 1 mg via INTRAVENOUS
  Filled 2019-12-04 (×13): qty 1

## 2019-12-04 MED ORDER — MORPHINE SULFATE (PF) 2 MG/ML IV SOLN: 2.0000 mg | Freq: Once | INTRAVENOUS | Status: AC

## 2019-12-04 MED ORDER — GUAIFENESIN 100 MG/5ML PO SOLN
5.0000 mL | ORAL | Status: DC | PRN
  Administered 2019-12-04 (×2): 100 mg via ORAL
  Filled 2019-12-04 (×3): qty 5

## 2019-12-04 MED ORDER — LEVALBUTEROL HCL 0.63 MG/3ML IN NEBU
0.6300 mg | INHALATION_SOLUTION | Freq: Four times a day (QID) | RESPIRATORY_TRACT | Status: DC
  Administered 2019-12-04 – 2019-12-09 (×20): 0.63 mg via RESPIRATORY_TRACT
  Filled 2019-12-04 (×22): qty 3

## 2019-12-04 MED ORDER — MORPHINE SULFATE (PF) 2 MG/ML IV SOLN
INTRAVENOUS | Status: AC
  Administered 2019-12-04: 2 mg via INTRAVENOUS
  Filled 2019-12-04: qty 1

## 2019-12-04 MED ORDER — VANCOMYCIN HCL IN DEXTROSE 1-5 GM/200ML-% IV SOLN
1000.0000 mg | INTRAVENOUS | Status: DC
  Administered 2019-12-04: 1000 mg via INTRAVENOUS
  Filled 2019-12-04 (×2): qty 200

## 2019-12-04 MED ORDER — SODIUM CHLORIDE 0.9 % IV SOLN: 250.0000 mL | INTRAVENOUS | Status: DC

## 2019-12-04 MED ORDER — PHENYLEPHRINE HCL-NACL 10-0.9 MG/250ML-% IV SOLN
25.0000 ug/min | INTRAVENOUS | Status: DC
  Filled 2019-12-04: qty 250

## 2019-12-04 MED ORDER — MAGNESIUM SULFATE 2 GM/50ML IV SOLN
2.0000 g | Freq: Once | INTRAVENOUS | Status: AC
  Administered 2019-12-04: 2 g via INTRAVENOUS
  Filled 2019-12-04: qty 50

## 2019-12-04 MED ORDER — SODIUM CHLORIDE 0.9% IV SOLUTION: Freq: Once | INTRAVENOUS | Status: DC

## 2019-12-04 NOTE — ACP (Advance Care Planning) (Signed)
Pt. names his S.O Arther Abbott as MPOA and mother Jaziel Bennett as alternate MPOA (see HCA tab in sidebar for contact info); pt. is interested in organ donation; pt. does not want life-prolonging measures if he has an illness that doctors say is terminal and will result in imminent death and/or if he becomes apparently permanently unconscious; he would potentially want life-prolonging measures in case of advanced dementia, however.   Pt. leaves MPOA option to make different decisions from Living Will.

## 2019-12-04 NOTE — Progress Notes (Signed)
CH received referral from palliative care NP to discuss/complete AD w/pt.  CH entered rm. and found pt. sitting up in bed finishing lunch.  Pt. eager to complete AD paperwork; Pt. names his S.O Larry Andrade as MPOA and mother Larry Andrade as alternate MPOA (see HCA tab in sidebar for contact info); pt. is interested in organ donation; pt. does not want life-prolonging measures if he has an illness that doctors say is terminal and will result in imminent death and/or if he becomes apparently permanently unconscious; he would potentially want life-prolonging measures in case of advanced dementia, however.   Pt. leaves MPOA option to make different decisions from Living Will.  CH made copy for scanning into EMR and copy for paper chart.  CH plans to follow up w/pt. tomorrow.

## 2019-12-04 NOTE — Progress Notes (Signed)
[facility]  ROUNDING NOTE   Subjective:   Family meeting this morning. Patient wants to get married and then have his significant other make his decisions as he transitions to comfort care.   Objective:  Vital signs in last 24 hours:  Temp:  [97.9 F (36.6 C)-100.2 F (37.9 C)] 100.2 F (37.9 C) (06/08 0800) Pulse Rate:  [70-142] 138 (06/08 1000) Resp:  [11-29] 22 (06/08 1000) BP: (80-118)/(60-96) 81/62 (06/08 1000) SpO2:  [84 %-98 %] 91 % (06/08 1000) Weight:  [102.2 kg] 102.2 kg (06/08 0330)  Weight change: 11.5 kg Filed Weights   31-Dec-2019 1801 12/03/19 0500 12/04/19 0330  Weight: 96.5 kg 96.8 kg 102.2 kg    Intake/Output: I/O last 3 completed shifts: In: 4963.6 [I.V.:2884.8; IV Piggyback:2078.8] Out: 3260 [Urine:910; Stool:2350]   Intake/Output this shift:  Total I/O In: 332.2 [I.V.:202.9; IV Piggyback:129.3] Out: 600 [Urine:600]  Physical Exam: General: Critically ill  Head: Normocephalic, atraumatic. Moist oral mucosal membranes  Eyes: +scleral icterus  Neck: trachea midline  Lungs:  Diminished bilaterally  Heart: Irregular, tachycardia  Abdomen:  +ascites  Extremities:  ++ peripheral edema.  Neurologic: Alert and oriented, +asterixis.   Skin: +jaundice        Basic Metabolic Panel: Recent Labs  Lab 31-Dec-2019 1422 12/03/19 0114 12/04/19 0547  NA 134* 132* 132*  K 4.5 4.4 4.0  CL 95* 95* 96*  CO2 22 20* 22  GLUCOSE 144* 182* 113*  BUN 51* 55* 69*  CREATININE 1.97* 2.32* 2.72*  CALCIUM 7.4* 7.0* 7.0*  MG  --  1.4* 1.7  PHOS 3.7 5.3* 3.8    Liver Function Tests: Recent Labs  Lab December 31, 2019 1422 12/04/19 0547  AST 61* 43*  ALT 44 32  ALKPHOS 119 96  BILITOT 8.8* 6.0*  PROT 6.1* 5.6*  ALBUMIN 2.1* 2.4*   Recent Labs  Lab 2019-12-31 1422  LIPASE 25  AMYLASE 20*   Recent Labs  Lab 2019-12-31 1422  AMMONIA 83*    CBC: Recent Labs  Lab Dec 31, 2019 1422 2019-12-31 1959 12/03/19 0114 12/03/19 0719 12/03/19 1318  12/03/19 2257 12/04/19 0547  WBC 23.2*  --  24.2*  --   --   --  19.6*  HGB 8.9*   < > 8.0* 7.8* 7.1* 7.1* 7.1*  HCT 26.7*   < > 24.5* 23.6* 21.5* 21.9* 20.8*  MCV 106.8*  --  108.4*  --   --   --  103.5*  PLT 164  --  124*  --   --   --  93*   < > = values in this interval not displayed.    Cardiac Enzymes: No results for input(s): CKTOTAL, CKMB, CKMBINDEX, TROPONINI in the last 168 hours.  BNP: Invalid input(s): POCBNP  CBG: Recent Labs  Lab 12-31-2019 1756  GLUCAP 131*    Microbiology: Results for orders placed or performed during the hospital encounter of 2019/12/31  Culture, blood (routine x 2)     Status: Abnormal (Preliminary result)   Collection Time: Dec 31, 2019  2:22 PM   Specimen: BLOOD  Result Value Ref Range Status   Specimen Description   Final    BLOOD LEFT ANTECUBITAL Performed at [facility], [address] [place], [place] [address]    Special Requests   Final    BOTTLES DRAWN AEROBIC AND ANAEROBIC Blood Culture adequate volume Performed at [facility], [address] [place], [place] [address]    Culture  Setup Time   Final  Organism ID to follow GRAM POSITIVE COCCI IN BOTH AEROBIC AND ANAEROBIC BOTTLES CRITICAL RESULT CALLED TO, READ BACK BY AND VERIFIED WITH: DAVID BESANTI 12/03/19 AT 0105 HS Performed at [facility], [address] [place], [place] [address]    Culture (A)  Final    STAPHYLOCOCCUS AUREUS CULTURE REINCUBATED FOR BETTER GROWTH SUSCEPTIBILITIES TO FOLLOW Performed at [facility], [address] [place], [place] [address]    Report Status PENDING  Incomplete  Blood Culture ID Panel (Reflexed)     Status: Abnormal   Collection Time: 11/30/2019  2:22 PM  Result Value Ref Range Status   Enterococcus species NOT DETECTED NOT DETECTED Final   Listeria monocytogenes NOT DETECTED NOT DETECTED Final   Staphylococcus species DETECTED (A) NOT DETECTED Final    Comment: CRITICAL RESULT CALLED TO, READ  BACK BY AND VERIFIED WITH: DAVID BESANTI 12/03/19 AT 0105 HS    Staphylococcus aureus (BCID) DETECTED (A) NOT DETECTED Final    Comment: Methicillin (oxacillin)-resistant Staphylococcus aureus (MRSA). MRSA is predictably resistant to beta-lactam antibiotics (except ceftaroline). Preferred therapy is vancomycin unless clinically contraindicated. Patient requires contact precautions if  hospitalized. CRITICAL RESULT CALLED TO, READ BACK BY AND VERIFIED WITH: DAVID BESANTI 12/03/19 AT 0105 HS    Methicillin resistance DETECTED (A) NOT DETECTED Final    Comment: CRITICAL RESULT CALLED TO, READ BACK BY AND VERIFIED WITH: DAVID BESANTI 12/03/19 AT 0105 HS    Streptococcus species DETECTED (A) NOT DETECTED Final    Comment: CRITICAL RESULT CALLED TO, READ BACK BY AND VERIFIED WITH: DAVID BESANTI 12/03/19 AT 0105 HS    Streptococcus agalactiae NOT DETECTED NOT DETECTED Final   Streptococcus pneumoniae DETECTED (A) NOT DETECTED Final    Comment: CRITICAL RESULT CALLED TO, READ BACK BY AND VERIFIED WITH: DAVID BESANTI 12/03/19 AT 0105 HS    Streptococcus pyogenes NOT DETECTED NOT DETECTED Final   Acinetobacter baumannii NOT DETECTED NOT DETECTED Final   Enterobacteriaceae species NOT DETECTED NOT DETECTED Final   Enterobacter cloacae complex NOT DETECTED NOT DETECTED Final   Escherichia coli NOT DETECTED NOT DETECTED Final   Klebsiella oxytoca NOT DETECTED NOT DETECTED Final   Klebsiella pneumoniae NOT DETECTED NOT DETECTED Final   Proteus species NOT DETECTED NOT DETECTED Final   Serratia marcescens NOT DETECTED NOT DETECTED Final   Haemophilus influenzae NOT DETECTED NOT DETECTED Final   Neisseria meningitidis NOT DETECTED NOT DETECTED Final   Pseudomonas aeruginosa NOT DETECTED NOT DETECTED Final   Candida albicans NOT DETECTED NOT DETECTED Final   Candida glabrata NOT DETECTED NOT DETECTED Final   Candida krusei NOT DETECTED NOT DETECTED Final   Candida parapsilosis NOT DETECTED NOT DETECTED  Final   Candida tropicalis NOT DETECTED NOT DETECTED Final    Comment: Performed at [facility], [address] [place], [place] [address]  Culture, blood (routine x 2)     Status: Abnormal (Preliminary result)   Collection Time: 12/22/2019  3:46 PM   Specimen: BLOOD  Result Value Ref Range Status   Specimen Description   Final    BLOOD LEFT ANTECUBITAL Performed at [facility], [address] [place], [place] [address]    Special Requests   Final    BOTTLES DRAWN AEROBIC AND ANAEROBIC Blood Culture results may not be optimal due to an excessive volume of blood received in culture bottles Performed at [facility], [address] [place], [place] [address]    Culture  Setup Time   Final  GRAM POSITIVE COCCI IN BOTH AEROBIC AND ANAEROBIC BOTTLES CRITICAL VALUE NOTED.  VALUE IS CONSISTENT WITH PREVIOUSLY REPORTED AND CALLED VALUE. Performed at [facility], [address] [place], [place] [address]    Culture (A)  Final    STAPHYLOCOCCUS AUREUS CULTURE REINCUBATED FOR BETTER GROWTH Performed at [facility], [address] [place], [place] [address]    Report Status PENDING  Incomplete  SARS Coronavirus 2 by RT PCR (hospital order, performed in [facility] hospital lab) Nasopharyngeal Nasopharyngeal Swab     Status: None   Collection Time: 12/20/2019  3:46 PM   Specimen: Nasopharyngeal Swab  Result Value Ref Range Status   SARS Coronavirus 2 NEGATIVE NEGATIVE Final    Comment: (NOTE) SARS-CoV-2 target nucleic acids are NOT DETECTED. The SARS-CoV-2 RNA is generally detectable in upper and lower respiratory specimens during the acute phase of infection. The lowest concentration of SARS-CoV-2 viral copies this assay can detect is 250 copies / mL. A negative result does not preclude SARS-CoV-2 infection and should not be used as the sole basis for treatment or other patient management decisions.  A negative result may occur  with improper specimen collection / handling, submission of specimen other than nasopharyngeal swab, presence of viral mutation(s) within the areas targeted by this assay, and inadequate number of viral copies (<250 copies / mL). A negative result must be combined with clinical observations, patient history, and epidemiological information. Fact Sheet for Patients:   BoilerBrush.com.cy Fact Sheet for Healthcare Providers: https://pope.com/ This test is not yet approved or cleared  by the [place] FDA and has been authorized for detection and/or diagnosis of SARS-CoV-2 by FDA under an Emergency Use Authorization (EUA).  This EUA will remain in effect (meaning this test can be used) for the duration of the COVID-19 declaration under Section 564(b)(1) of the Act, 21 U.S.C. section 360bbb-3(b)(1), unless the authorization is terminated or revoked sooner. Performed at [facility], [address] [place], [place] [address]     Coagulation Studies: Recent Labs    12/25/2019 1422  LABPROT 19.8*  INR 1.8*    Urinalysis: Recent Labs    12/17/2019 1624  COLORURINE AMBER*  LABSPEC 1.015  PHURINE 5.0  GLUCOSEU NEGATIVE  HGBUR LARGE*  BILIRUBINUR NEGATIVE  KETONESUR NEGATIVE  PROTEINUR NEGATIVE  NITRITE NEGATIVE  LEUKOCYTESUR NEGATIVE      Imaging: DG Chest Port 1 View  Result Date: 12/04/2019 CLINICAL DATA:  Acute respiratory failure EXAM: PORTABLE CHEST 1 VIEW COMPARISON:  Two days ago FINDINGS: Patchy bilateral infiltrate which has mildly worsened. Lung volumes are low. Cardiomegaly. No visible effusion or pneumothorax IMPRESSION: 1. Patchy infiltrates with mild worsening from 2 days ago. 2. Cardiomegaly. Electronically Signed   By: Marnee Spring M.D.   On: 12/04/2019 07:34   DG Chest Portable 1 View  Result Date: 12/10/2019 CLINICAL DATA:  Increased jaundice, weakness, edema; history CHF, cirrhosis, hypertension,  smoker, gout EXAM: PORTABLE CHEST 1 VIEW COMPARISON:  Portable exam 1442 hours compared to 10/06/2019 FINDINGS: Enlargement of cardiac silhouette. Mediastinal contours normal. Atherosclerotic calcification aorta. BILATERAL pulmonary infiltrates new since previous exam, could represent pulmonary edema or atypical infection. Subsegmental atelectasis LEFT base. No pleural effusion or pneumothorax. Bones demineralized. IMPRESSION: Enlargement of cardiac silhouette. New BILATERAL pulmonary infiltrates question pulmonary edema versus atypical infection. Electronically Signed   By: Ulyses Southward M.D.   On: 12/25/2019 15:07   ECHOCARDIOGRAM COMPLETE  Result Date: 12/04/2019    ECHOCARDIOGRAM REPORT   Patient Name:  Larry Andrade. Date of Exam: 12/03/2019 Medical Rec #:  [id number]       Height:       74.0 in Accession #:    [id number]      Weight:       213.4 lb Date of Birth:  25-Jan-1961       BSA:          2.234 m Patient Age:    58 years        BP:           96/66 mmHg Patient Gender: M               HR:           139 bpm. Exam Location:  [facility] Procedure: 2D Echo, Cardiac Doppler and Color Doppler Indications:     Bacteremia 790.7  History:         Patient has prior history of Echocardiogram examinations. CHF;                  Risk Factors:Hypertension.  Sonographer:     Neysa Bonito Roar Referring Phys:  [id number] Judithe Modest Diagnosing Phys: Yvonne Kendall MD IMPRESSIONS  1. Left ventricular ejection fraction, by estimation, is 55 to 60%. The left ventricle has normal function. The left ventricle has no regional wall motion abnormalities. There is mild left ventricular hypertrophy. Left ventricular diastolic parameters are indeterminate.  2. Right ventricular systolic function is mildly reduced. The right ventricular size is normal. Mildly increased right ventricular wall thickness. There is mildly elevated pulmonary artery systolic pressure. The estimated right ventricular systolic pressure is 39.0 mmHg.  3. Left atrial  size was moderately dilated.  4. Right atrial size was mildly dilated.  5. The mitral valve is grossly normal. Mild mitral valve regurgitation.  6. The aortic valve was not well visualized. Aortic valve regurgitation is not visualized. Mild aortic valve sclerosis is present, with no evidence of aortic valve stenosis.  7. The inferior vena cava is dilated in size with <50% respiratory variability, suggesting right atrial pressure of 15 mmHg. FINDINGS  Left Ventricle: Left ventricular ejection fraction, by estimation, is 55 to 60%. The left ventricle has normal function. The left ventricle has no regional wall motion abnormalities. The left ventricular internal cavity size was normal in size. There is  mild left ventricular hypertrophy. Left ventricular diastolic parameters are indeterminate. Right Ventricle: The right ventricular size is normal. Mildly increased right ventricular wall thickness. Right ventricular systolic function is mildly reduced. There is mildly elevated pulmonary artery systolic pressure. The tricuspid regurgitant velocity is 2.45 m/s, and with an assumed right atrial pressure of 15 mmHg, the estimated right ventricular systolic pressure is 39.0 mmHg. Left Atrium: Left atrial size was moderately dilated. Right Atrium: Right atrial size was mildly dilated. Pericardium: Trivial pericardial effusion is present. Mitral Valve: The mitral valve is grossly normal. Mild mitral annular calcification. Mild mitral valve regurgitation. Tricuspid Valve: The tricuspid valve is normal in structure. Tricuspid valve regurgitation is mild. Aortic Valve: The aortic valve was not well visualized. . There is mild thickening of the aortic valve. Aortic valve regurgitation is not visualized. Mild aortic valve sclerosis is present, with no evidence of aortic valve stenosis. There is mild thickening of the aortic valve. Aortic valve mean gradient measures 9.0 mmHg. Aortic valve peak gradient measures 14.1 mmHg. Aortic  valve area, by VTI measures 1.46 cm. Pulmonic Valve: The pulmonic valve was not well visualized. Pulmonic valve regurgitation is trivial.  No evidence of pulmonic stenosis. Aorta: The aortic root is normal in size and structure. Pulmonary Artery: The pulmonary artery is not well seen. Venous: The inferior vena cava is dilated in size with less than 50% respiratory variability, suggesting right atrial pressure of 15 mmHg. IAS/Shunts: The interatrial septum was not well visualized.  LEFT VENTRICLE PLAX 2D LVIDd:         4.87 cm  Diastology LVIDs:         3.65 cm  LV e' lateral:   8.81 cm/s LV PW:         1.21 cm  LV E/e' lateral: 16.3 LV IVS:        1.29 cm  LV e' medial:    7.51 cm/s LVOT diam:     1.80 cm  LV E/e' medial:  19.2 LV SV:         44 LV SV Index:   20 LVOT Area:     2.54 cm  RIGHT VENTRICLE RV Mid diam:    3.53 cm RV S prime:     13.20 cm/s TAPSE (M-mode): 1.8 cm LEFT ATRIUM              Index       RIGHT ATRIUM           Index LA diam:        4.50 cm  2.01 cm/m  RA Area:     20.00 cm LA Vol (A2C):   118.0 ml 52.81 ml/m RA Volume:   54.10 ml  24.21 ml/m LA Vol (A4C):   92.3 ml  41.31 ml/m LA Biplane Vol: 106.0 ml 47.44 ml/m  AORTIC VALVE                    PULMONIC VALVE AV Area (Vmax):    1.53 cm     PV Vmax:        1.00 m/s AV Area (Vmean):   1.35 cm     PV Peak grad:   4.0 mmHg AV Area (VTI):     1.46 cm     RVOT Peak grad: 2 mmHg AV Vmax:           188.00 cm/s AV Vmean:          138.000 cm/s AV VTI:            0.303 m AV Peak Grad:      14.1 mmHg AV Mean Grad:      9.0 mmHg LVOT Vmax:         113.00 cm/s LVOT Vmean:        73.000 cm/s LVOT VTI:          0.174 m LVOT/AV VTI ratio: 0.57  AORTA Ao Root diam: 3.10 cm MITRAL VALVE                TRICUSPID VALVE MV Area (PHT): 4.41 cm     TR Peak grad:   24.0 mmHg MV Decel Time: 172 msec     TR Vmax:        245.00 cm/s MV E velocity: 144.00 cm/s                             SHUNTS                             Systemic VTI:  0.17 m  Systemic Diam: 1.80 cm Nelva Bush MD Electronically signed by Nelva Bush MD Signature Date/Time: 12/04/2019/7:05:58 AM    Final      Medications:    sodium chloride Stopped (12/03/19 1751)   sodium chloride     albumin Andrade Stopped (12/04/19 0819)   amiodarone 60 mg/hr (12/04/19 1000)   cefTRIAXone (ROCEPHIN)  IV Stopped (12/04/19 0544)   octreotide  (SANDOSTATIN)    IV infusion 50 mcg/hr (12/04/19 1000)   pantoprozole (PROTONIX) infusion 8 mg/hr (12/04/19 1000)   phenylephrine (NEO-SYNEPHRINE) Adult infusion Stopped (12/03/19 1100)   vancomycin Stopped (12/03/19 1723)    sodium chloride   Intravenous Once   allopurinol  100 mg Oral BID   folic acid  1 mg Oral Daily   lactulose  30 g Oral BID   levalbuterol  0.63 mg Nebulization Q6H   mouth rinse  15 mL Mouth Rinse BID   metoprolol succinate  25 mg Oral BID   nicotine  21 mg Transdermal Daily   [START ON 12/06/2019] pantoprazole  40 mg Intravenous Q12H   thiamine  100 mg Oral Daily   sodium chloride, docusate sodium, guaiFENesin, levalbuterol, ondansetron (ZOFRAN) IV, oxyCODONE, polyethylene glycol  Assessment/ Plan:  Mr. Larry Andrade. is a 59 y.o. white male with cirrhosis, congestive heart failure, gout, GERD, hypertension, pancreatitis, alcohol abuse who was admitted to [facility] on 12/23/2019 for Hepatic encephalopathy syndrome ([place]) [K72.90] Sepsis, due to unspecified organism, unspecified whether acute organ dysfunction present ([place]) [A41.9]  1. Acute renal failure on chronic kidney disease stage IIIA: baseline creatinine of 1.45, GFR of 53 on 4/14.  History of bland urine.  Chronic kidney disease secondary to hypertension Acute renal failure secondary to ATN Nonoliguric urine output. No acute indication for dialysis.  2. Anion gap metabolic acidosis: with renal failure and liver failure and ongoing alcohol use  3. Hyponatremia: secondary to cirrhosis.   4. Anasarca with  hypoalbuminemia  5. Anemia with renal failure: macrocytosis: with alcohol abuse.  Recent GI bleed. Most likely again with GIB.   6. 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   Plan - Discontinue IV albumin 25g IV q8.   Will sign off. Please call with questions.    LOS: 2 Larry Andrade 6/8/202111:56 AM

## 2019-12-04 NOTE — Progress Notes (Signed)
Larry Repress, MD [address]  [address]  [place], [place] [address]  Main: [phone]  Fax: [phone] Pager: [phone]   Subjective: Patient had 2 episodes of rectal bleeding today.  Hemoglobin dropped from 7.8-7.1 today continues to remain on pressor support.  Worsening kidney function.  Patient's sister and brother-in-law are bedside.  Patient is made DNR/DNI today.  Awaiting comfort care orders to be signed by the patient Patient's family confirmed that unfortunately patient has been actively drinking until before this admission  Objective: Vital signs in last 24 hours: Vitals:   12/04/19 1500 12/04/19 1600 12/04/19 1700 12/04/19 1800  BP: (!) 77/54 (!) 82/60 92/81 (!) 74/57  Pulse: (!) 116 (!) 119 (!) 115 (!) 121  Resp: 19 16 19 20   Temp:  98.1 F (36.7 C)    TempSrc:  Oral    SpO2: 92% 95% 93% 92%  Weight:      Height:       Weight change: 11.5 kg  Intake/Output Summary (Last 24 hours) at 12/04/2019 1909 Last data filed at 12/04/2019 1800 Gross per 24 hour  Intake 2283.14 ml  Output 2085 ml  Net 198.14 ml     Exam: Heart:: Tachycardia, regular rhythm Lungs: bibasilar rales Abdomen: Soft, distended, dull to percussion   Lab Results: CBC Latest Ref Rng & Units 12/04/2019 12/03/2019 12/03/2019  WBC 4.0 - 10.5 K/uL 19.6(H) - -  Hemoglobin 13.0 - 17.0 g/dL 7.1(L) 7.1(L) 7.1(L)  Hematocrit 39.0 - 52.0 % 20.8(L) 21.9(L) 21.5(L)  Platelets 150 - 400 K/uL 93(L) - -   CMP Latest Ref Rng & Units 12/04/2019 12/03/2019 December 25, 2019  Glucose 70 - 99 mg/dL 02/01/2020) 001(V) 494(W)  BUN 6 - 20 mg/dL 967(R) 91(M) 38(G)  Creatinine 0.61 - 1.24 mg/dL 66(Z) 9.93(T) 7.01(X)  Sodium 135 - 145 mmol/L 132(L) 132(L) 134(L)  Potassium 3.5 - 5.1 mmol/L 4.0 4.4 4.5  Chloride 98 - 111 mmol/L 96(L) 95(L) 95(L)  CO2 22 - 32 mmol/L 22 20(L) 22  Calcium 8.9 - 10.3 mg/dL 7.0(L) 7.0(L) 7.4(L)  Total Protein 6.5 - 8.1 g/dL 7.93(J) - 6.1(L)  Total Bilirubin 0.3 - 1.2 mg/dL 6.0(H) - 8.8(H)   Alkaline Phos 38 - 126 U/L 96 - 119  AST 15 - 41 U/L 43(H) - 61(H)  ALT 0 - 44 U/L 32 - 44    Micro Results: Recent Results (from the past 240 hour(s))  Culture, blood (routine x 2)     Status: Abnormal (Preliminary result)   Collection Time: 25-Dec-2019  2:22 PM   Specimen: BLOOD  Result Value Ref Range Status   Specimen Description   Final    BLOOD LEFT ANTECUBITAL Performed at [facility], [address], [place], [place]    Special Requests   Final    BOTTLES DRAWN AEROBIC AND ANAEROBIC Blood Culture adequate volume Performed at [facility], [address] [place], [place]    Culture  Setup Time   Final    Organism ID to follow GRAM POSITIVE COCCI IN BOTH AEROBIC AND ANAEROBIC BOTTLES CRITICAL RESULT CALLED TO, READ BACK BY AND VERIFIED WITH: DAVID BESANTI 12/03/19 AT 0105 HS Performed at [facility], [address], [place], [place]    Culture (A)  Final    STAPHYLOCOCCUS AUREUS CULTURE REINCUBATED FOR BETTER GROWTH SUSCEPTIBILITIES TO FOLLOW Performed at [facility], [address], [place], [place]    Report Status PENDING  Incomplete  Blood Culture ID Panel (Reflexed)     Status: Abnormal   Collection Time: 14-Dec-2019  2:22 PM  Result Value Ref Range Status   Enterococcus species NOT DETECTED NOT DETECTED Final   Listeria monocytogenes NOT DETECTED NOT DETECTED Final   Staphylococcus species DETECTED (A) NOT DETECTED Final    Comment: CRITICAL RESULT CALLED TO, READ BACK BY AND VERIFIED WITH: DAVID BESANTI 12/03/19 AT 0105 HS    Staphylococcus aureus (BCID) DETECTED (A) NOT DETECTED Final    Comment: Methicillin (oxacillin)-resistant Staphylococcus aureus (MRSA). MRSA is predictably resistant to beta-lactam antibiotics (except ceftaroline). Preferred therapy is vancomycin unless clinically contraindicated. Patient requires contact precautions if  hospitalized. CRITICAL RESULT CALLED TO, READ  BACK BY AND VERIFIED WITH: DAVID BESANTI 12/03/19 AT 0105 HS    Methicillin resistance DETECTED (A) NOT DETECTED Final    Comment: CRITICAL RESULT CALLED TO, READ BACK BY AND VERIFIED WITH: DAVID BESANTI 12/03/19 AT 0105 HS    Streptococcus species DETECTED (A) NOT DETECTED Final    Comment: CRITICAL RESULT CALLED TO, READ BACK BY AND VERIFIED WITH: DAVID BESANTI 12/03/19 AT 0105 HS    Streptococcus agalactiae NOT DETECTED NOT DETECTED Final   Streptococcus pneumoniae DETECTED (A) NOT DETECTED Final    Comment: CRITICAL RESULT CALLED TO, READ BACK BY AND VERIFIED WITH: DAVID BESANTI 12/03/19 AT 0105 HS    Streptococcus pyogenes NOT DETECTED NOT DETECTED Final   Acinetobacter baumannii NOT DETECTED NOT DETECTED Final   Enterobacteriaceae species NOT DETECTED NOT DETECTED Final   Enterobacter cloacae complex NOT DETECTED NOT DETECTED Final   Escherichia coli NOT DETECTED NOT DETECTED Final   Klebsiella oxytoca NOT DETECTED NOT DETECTED Final   Klebsiella pneumoniae NOT DETECTED NOT DETECTED Final   Proteus species NOT DETECTED NOT DETECTED Final   Serratia marcescens NOT DETECTED NOT DETECTED Final   Haemophilus influenzae NOT DETECTED NOT DETECTED Final   Neisseria meningitidis NOT DETECTED NOT DETECTED Final   Pseudomonas aeruginosa NOT DETECTED NOT DETECTED Final   Candida albicans NOT DETECTED NOT DETECTED Final   Candida glabrata NOT DETECTED NOT DETECTED Final   Candida krusei NOT DETECTED NOT DETECTED Final   Candida parapsilosis NOT DETECTED NOT DETECTED Final   Candida tropicalis NOT DETECTED NOT DETECTED Final    Comment: Performed at [facility], [address] [place], [place] [address]  Culture, blood (routine x 2)     Status: Abnormal (Preliminary result)   Collection Time: 12-14-2019  3:46 PM   Specimen: BLOOD  Result Value Ref Range Status   Specimen Description   Final    BLOOD LEFT ANTECUBITAL Performed at [facility], [address]  [place], [place] [address]    Special Requests   Final    BOTTLES DRAWN AEROBIC AND ANAEROBIC Blood Culture results may not be optimal due to an excessive volume of blood received in culture bottles Performed at [facility], [address] [place], [place] [address]    Culture  Setup Time   Final    GRAM POSITIVE COCCI IN BOTH AEROBIC AND ANAEROBIC BOTTLES CRITICAL VALUE NOTED.  VALUE IS CONSISTENT WITH PREVIOUSLY REPORTED AND CALLED VALUE. Performed at [facility], [address], [place], [place] [address]    Culture (A)  Final    STAPHYLOCOCCUS AUREUS CULTURE REINCUBATED FOR BETTER GROWTH Performed at [facility], [address] [place], [place] [address]    Report Status PENDING  Incomplete  SARS Coronavirus 2 by RT PCR (hospital order,  performed in [facility] hospital lab) Nasopharyngeal Nasopharyngeal Swab     Status: None   Collection Time: 12/03/2019  3:46 PM   Specimen: Nasopharyngeal Swab  Result Value Ref Range Status   SARS Coronavirus 2 NEGATIVE NEGATIVE Final    Comment: (NOTE) SARS-CoV-2 target nucleic acids are NOT DETECTED. The SARS-CoV-2 RNA is generally detectable in upper and lower respiratory specimens during the acute phase of infection. The lowest concentration of SARS-CoV-2 viral copies this assay can detect is 250 copies / mL. A negative result does not preclude SARS-CoV-2 infection and should not be used as the sole basis for treatment or other patient management decisions.  A negative result may occur with improper specimen collection / handling, submission of specimen other than nasopharyngeal swab, presence of viral mutation(s) within the areas targeted by this assay, and inadequate number of viral copies (<250 copies / mL). A negative result must be combined with clinical observations, patient history, and epidemiological information. Fact Sheet for Patients:   BoilerBrush.com.cyhttps://www.fda.gov/media/136312/download Fact Sheet for  Healthcare Providers: https://pope.com/https://www.fda.gov/media/136313/download This test is not yet approved or cleared  by the [place] FDA and has been authorized for detection and/or diagnosis of SARS-CoV-2 by FDA under an Emergency Use Authorization (EUA).  This EUA will remain in effect (meaning this test can be used) for the duration of the COVID-19 declaration under Section 564(b)(1) of the Act, 21 U.S.C. section 360bbb-3(b)(1), unless the authorization is terminated or revoked sooner. Performed at [facility], [address] [place], [place] [address]    Studies/Results: DG Chest Port 1 View  Result Date: 12/04/2019 CLINICAL DATA:  Acute respiratory failure EXAM: PORTABLE CHEST 1 VIEW COMPARISON:  Two days ago FINDINGS: Patchy bilateral infiltrate which has mildly worsened. Lung volumes are low. Cardiomegaly. No visible effusion or pneumothorax IMPRESSION: 1. Patchy infiltrates with mild worsening from 2 days ago. 2. Cardiomegaly. Electronically Signed   By: Marnee SpringJonathon  Watts M.D.   On: 12/04/2019 07:34   ECHOCARDIOGRAM COMPLETE  Result Date: 12/04/2019    ECHOCARDIOGRAM REPORT   Patient Name:   Larry HumanJohn Patrone Jr. Date of Exam: 12/03/2019 Medical Rec #:  [id number]       Height:       74.0 in Accession #:    [id number]      Weight:       213.4 lb Date of Birth:  May 25, 1961       BSA:          2.234 m Patient Age:    58 years        BP:           96/66 mmHg Patient Gender: M               HR:           139 bpm. Exam Location:  [facility] Procedure: 2D Echo, Cardiac Doppler and Color Doppler Indications:     Bacteremia 790.7  History:         Patient has prior history of Echocardiogram examinations. CHF;                  Risk Factors:Hypertension.  Sonographer:     Neysa Bonitohristy Roar Referring Phys:  [id number] Judithe ModestJEREMIAH D KEENE Diagnosing Phys: Yvonne Kendallhristopher End MD IMPRESSIONS  1. Left ventricular ejection fraction, by estimation, is 55 to 60%. The left ventricle has normal function. The left ventricle has no  regional wall motion abnormalities. There is mild left ventricular hypertrophy. Left ventricular diastolic parameters are indeterminate.  2. Right ventricular systolic function is mildly reduced. The right ventricular size is normal. Mildly increased right ventricular wall thickness. There is mildly elevated pulmonary artery systolic pressure. The estimated right ventricular systolic pressure is 39.0 mmHg.  3. Left atrial size was moderately dilated.  4. Right atrial size was mildly dilated.  5. The mitral valve is grossly normal. Mild mitral valve regurgitation.  6. The aortic valve was not well visualized. Aortic valve regurgitation is not visualized. Mild aortic valve sclerosis is present, with no evidence of aortic valve stenosis.  7. The inferior vena cava is dilated in size with <50% respiratory variability, suggesting right atrial pressure of 15 mmHg. FINDINGS  Left Ventricle: Left ventricular ejection fraction, by estimation, is 55 to 60%. The left ventricle has normal function. The left ventricle has no regional wall motion abnormalities. The left ventricular internal cavity size was normal in size. There is  mild left ventricular hypertrophy. Left ventricular diastolic parameters are indeterminate. Right Ventricle: The right ventricular size is normal. Mildly increased right ventricular wall thickness. Right ventricular systolic function is mildly reduced. There is mildly elevated pulmonary artery systolic pressure. The tricuspid regurgitant velocity is 2.45 m/s, and with an assumed right atrial pressure of 15 mmHg, the estimated right ventricular systolic pressure is 39.0 mmHg. Left Atrium: Left atrial size was moderately dilated. Right Atrium: Right atrial size was mildly dilated. Pericardium: Trivial pericardial effusion is present. Mitral Valve: The mitral valve is grossly normal. Mild mitral annular calcification. Mild mitral valve regurgitation. Tricuspid Valve: The tricuspid valve is normal in  structure. Tricuspid valve regurgitation is mild. Aortic Valve: The aortic valve was not well visualized. . There is mild thickening of the aortic valve. Aortic valve regurgitation is not visualized. Mild aortic valve sclerosis is present, with no evidence of aortic valve stenosis. There is mild thickening of the aortic valve. Aortic valve mean gradient measures 9.0 mmHg. Aortic valve peak gradient measures 14.1 mmHg. Aortic valve area, by VTI measures 1.46 cm. Pulmonic Valve: The pulmonic valve was not well visualized. Pulmonic valve regurgitation is trivial. No evidence of pulmonic stenosis. Aorta: The aortic root is normal in size and structure. Pulmonary Artery: The pulmonary artery is not well seen. Venous: The inferior vena cava is dilated in size with less than 50% respiratory variability, suggesting right atrial pressure of 15 mmHg. IAS/Shunts: The interatrial septum was not well visualized.  LEFT VENTRICLE PLAX 2D LVIDd:         4.87 cm  Diastology LVIDs:         3.65 cm  LV e' lateral:   8.81 cm/s LV PW:         1.21 cm  LV E/e' lateral: 16.3 LV IVS:        1.29 cm  LV e' medial:    7.51 cm/s LVOT diam:     1.80 cm  LV E/e' medial:  19.2 LV SV:         44 LV SV Index:   20 LVOT Area:     2.54 cm  RIGHT VENTRICLE RV Mid diam:    3.53 cm RV S prime:     13.20 cm/s TAPSE (M-mode): 1.8 cm LEFT ATRIUM              Index       RIGHT ATRIUM           Index LA diam:        4.50 cm  2.01 cm/m  RA Area:  20.00 cm LA Vol (A2C):   118.0 ml 52.81 ml/m RA Volume:   54.10 ml  24.21 ml/m LA Vol (A4C):   92.3 ml  41.31 ml/m LA Biplane Vol: 106.0 ml 47.44 ml/m  AORTIC VALVE                    PULMONIC VALVE AV Area (Vmax):    1.53 cm     PV Vmax:        1.00 m/s AV Area (Vmean):   1.35 cm     PV Peak grad:   4.0 mmHg AV Area (VTI):     1.46 cm     RVOT Peak grad: 2 mmHg AV Vmax:           188.00 cm/s AV Vmean:          138.000 cm/s AV VTI:            0.303 m AV Peak Grad:      14.1 mmHg AV Mean Grad:      9.0  mmHg LVOT Vmax:         113.00 cm/s LVOT Vmean:        73.000 cm/s LVOT VTI:          0.174 m LVOT/AV VTI ratio: 0.57  AORTA Ao Root diam: 3.10 cm MITRAL VALVE                TRICUSPID VALVE MV Area (PHT): 4.41 cm     TR Peak grad:   24.0 mmHg MV Decel Time: 172 msec     TR Vmax:        245.00 cm/s MV E velocity: 144.00 cm/s                             SHUNTS                             Systemic VTI:  0.17 m                             Systemic Diam: 1.80 cm Yvonne Kendall MD Electronically signed by Yvonne Kendall MD Signature Date/Time: 12/04/2019/7:05:58 AM    Final    Medications:  I have reviewed the patient's current medications. Prior to Admission:  Medications Prior to Admission  Medication Sig Dispense Refill Last Dose  . allopurinol (ZYLOPRIM) 100 MG tablet Take 100 mg by mouth 2 (two) times daily.    12/21/2019 at 1200  . amiodarone (PACERONE) 200 MG tablet Take 1 tablet (200 mg total) by mouth daily. 30 tablet 0 11/29/2019 at 1200  . folic acid (FOLVITE) 1 MG tablet Take 1 tablet (1 mg total) by mouth daily. 30 tablet 0 12/18/2019 at 1200  . furosemide (LASIX) 40 MG tablet Take 40 mg by mouth every morning.   12/03/2019 at 1200  . methylPREDNISolone (MEDROL) 8 MG tablet Take 5 tablets (40 mg total) by mouth daily for 28 days. 140 tablet 0 11/28/2019 at 1200  . metoprolol succinate (TOPROL-XL) 25 MG 24 hr tablet Take 25 mg by mouth 2 (two) times daily.   12/05/2019 at 1200  . Oxycodone HCl 10 MG TABS Take 10 mg by mouth 3 (three) times daily as needed.   12/22/2019 at 0800  . pantoprazole (PROTONIX) 40 MG tablet Take 1 tablet (40 mg total) by  mouth daily. 30 tablet 11 2019/12/29 at 1200  . thiamine 100 MG tablet Take 1 tablet (100 mg total) by mouth daily. 30 tablet 0 29-Dec-2019 at 1200  . albuterol (PROVENTIL) (2.5 MG/3ML) 0.083% nebulizer solution Take 2.5 mg by nebulization every 6 (six) hours as needed for wheezing or shortness of breath.   unknown at prn  . albuterol (VENTOLIN HFA) 108 (90 Base)  MCG/ACT inhaler Inhale into the lungs every 6 (six) hours as needed for wheezing or shortness of breath.   unknown at prn  . colchicine 0.6 MG tablet Take 0.6 mg by mouth as needed.   unknown at prn  . nicotine (NICODERM CQ - DOSED IN MG/24 HOURS) 21 mg/24hr patch Place 1 patch (21 mg total) onto the skin daily. 28 patch 0    Scheduled: . sodium chloride   Intravenous Once  . allopurinol  100 mg Oral BID  . folic acid  1 mg Oral Daily  . lactulose  30 g Oral BID  . levalbuterol  0.63 mg Nebulization Q6H  . mouth rinse  15 mL Mouth Rinse BID  . metoprolol succinate  25 mg Oral BID  . nicotine  21 mg Transdermal Daily  . [START ON 12/06/2019] pantoprazole  40 mg Intravenous Q12H  . thiamine  100 mg Oral Daily   Continuous: . sodium chloride Stopped (12/03/19 0611)  . sodium chloride    . sodium chloride    . amiodarone 60 mg/hr (12/04/19 1800)  . cefTRIAXone (ROCEPHIN)  IV Stopped (12/04/19 0544)  . octreotide  (SANDOSTATIN)    IV infusion 50 mcg/hr (12/04/19 1800)  . pantoprozole (PROTONIX) infusion 8 mg/hr (12/04/19 1800)  . phenylephrine (NEO-SYNEPHRINE) Adult infusion 50 mcg/min (12/04/19 1340)  . vancomycin 200 mL/hr at 12/04/19 1800   ZOX:WRUEAV chloride, docusate sodium, guaiFENesin, levalbuterol, ondansetron (ZOFRAN) IV, oxyCODONE, polyethylene glycol Anti-infectives (From admission, onward)   Start     Dose/Rate Route Frequency Ordered Stop   12/04/19 1700  vancomycin (VANCOCIN) IVPB 1000 mg/200 mL premix     1,000 mg 200 mL/hr over 60 Minutes Intravenous Every 24 hours 12/04/19 1520     12/03/19 1600  vancomycin (VANCOREADY) IVPB 1500 mg/300 mL  Status:  Discontinued     1,500 mg 150 mL/hr over 120 Minutes Intravenous Every 24 hours 12/03/19 0125 12/04/19 1520   12/03/19 0600  cefTRIAXone (ROCEPHIN) 2 g in sodium chloride 0.9 % 100 mL IVPB     2 g 200 mL/hr over 30 Minutes Intravenous Every 24 hours 12/29/19 1934     12/29/19 1700  azithromycin (ZITHROMAX) 500 mg in  sodium chloride 0.9 % 250 mL IVPB  Status:  Discontinued     500 mg 250 mL/hr over 60 Minutes Intravenous Every 24 hours 12-29-19 1654 12/03/19 0119   2019/12/29 1545  vancomycin (VANCOCIN) IVPB 1000 mg/200 mL premix  Status:  Discontinued     1,000 mg 200 mL/hr over 60 Minutes Intravenous  Once Dec 29, 2019 1532 Dec 29, 2019 1536   29-Dec-2019 1545  ceFEPIme (MAXIPIME) 2 g in sodium chloride 0.9 % 100 mL IVPB     2 g 200 mL/hr over 30 Minutes Intravenous  Once 12-29-19 1532 12/29/19 1724   12/29/2019 1545  vancomycin (VANCOREADY) IVPB 2000 mg/400 mL     2,000 mg 200 mL/hr over 120 Minutes Intravenous  Once 2019/12/29 1536 29-Dec-2019 1806     Scheduled Meds: . sodium chloride   Intravenous Once  . allopurinol  100 mg Oral BID  . folic acid  1 mg Oral Daily  . lactulose  30 g Oral BID  . levalbuterol  0.63 mg Nebulization Q6H  . mouth rinse  15 mL Mouth Rinse BID  . metoprolol succinate  25 mg Oral BID  . nicotine  21 mg Transdermal Daily  . [START ON 12/06/2019] pantoprazole  40 mg Intravenous Q12H  . thiamine  100 mg Oral Daily   Continuous Infusions: . sodium chloride Stopped (12/03/19 0611)  . sodium chloride    . sodium chloride    . amiodarone 60 mg/hr (12/04/19 1800)  . cefTRIAXone (ROCEPHIN)  IV Stopped (12/04/19 0544)  . octreotide  (SANDOSTATIN)    IV infusion 50 mcg/hr (12/04/19 1800)  . pantoprozole (PROTONIX) infusion 8 mg/hr (12/04/19 1800)  . phenylephrine (NEO-SYNEPHRINE) Adult infusion 50 mcg/min (12/04/19 1340)  . 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

## 2019-12-04 NOTE — Progress Notes (Signed)
New dark red blood with clots noted leaking around rectal tube.  Rectal tube removed at 0630 per NP.   Patient continues to have an elevated HR while on decreased amio dose; rate increased to 60mg /hr per NP orders.

## 2019-12-04 NOTE — Progress Notes (Signed)
Daily Progress Note   Patient Name: Larry Andrade.       Date: 12/04/2019 DOB: 04/15/1961  Age: 59 y.o. MRN#: [id number] Attending Physician: Erin Fulling, MD Primary Care Physician: Gildardo Pounds, PA Admit Date: Dec 13, 2019  Reason for Consultation/Follow-up: Establishing goals of care  Subjective: Patient is resting in bed. His youngest sister, mother, and significant other are at bedside. Chaplain has completed paperwork for significant other to be primary HPOA and would like the mother and sister to be secondary.   We discussed his diagnosis, prognosis, GOC, EOL wishes disposition and options.  A detailed discussion was had today regarding advanced directives.  Concepts specific to code status, artifical feeding and hydration, IV antibiotics and rehospitalization were discussed.  The difference between an aggressive medical intervention path and a comfort care path was discussed.  Values and goals of care important to patient and family were attempted to be elicited.  Discussed limitations of medical interventions to prolong quality of life in some situations and discussed the concept of human mortality.  The family states they do not want him to suffer and understand he is end stage of his liver disease and his kidneys are worsening. They understand he is dying and has a prognosis of days to weeks to live. The family is willing to honor his wishes. He states he would like to marry his significant other, who he has already given a ring and complete a handwritten will. He  States he will then stop life prolonging care and transition to hospice care.  RN and CCM aware. Paged chaplain.     Length of Stay: 2  Current Medications: Scheduled Meds:  . sodium chloride   Intravenous Once  .  allopurinol  100 mg Oral BID  . folic acid  1 mg Oral Daily  . lactulose  30 g Oral BID  . levalbuterol  0.63 mg Nebulization Q6H  . mouth rinse  15 mL Mouth Rinse BID  . metoprolol succinate  25 mg Oral BID  . nicotine  21 mg Transdermal Daily  . [START ON 12/06/2019] pantoprazole  40 mg Intravenous Q12H  . thiamine  100 mg Oral Daily    Continuous Infusions: . sodium chloride Stopped (12/03/19 0611)  . sodium chloride    . albumin human Stopped (12/04/19 0819)  .  amiodarone 60 mg/hr (12/04/19 1000)  . cefTRIAXone (ROCEPHIN)  IV Stopped (12/04/19 0544)  . octreotide  (SANDOSTATIN)    IV infusion 50 mcg/hr (12/04/19 1000)  . pantoprozole (PROTONIX) infusion 8 mg/hr (12/04/19 1000)  . phenylephrine (NEO-SYNEPHRINE) Adult infusion Stopped (12/03/19 1100)  . vancomycin Stopped (12/03/19 1723)    PRN Meds: sodium chloride, docusate sodium, guaiFENesin, levalbuterol, ondansetron (ZOFRAN) IV, oxyCODONE, polyethylene glycol  Physical Exam Pulmonary:     Effort: Pulmonary effort is normal.  Neurological:     Mental Status: He is alert.             Vital Signs: BP (!) 81/62   Pulse (!) 138   Temp 100.2 F (37.9 C) (Oral)   Resp (!) 22   Ht 6\' 2"  (1.88 m)   Wt 102.2 kg   SpO2 91%   BMI 28.93 kg/m  SpO2: SpO2: 91 % O2 Device: O2 Device: Nasal Cannula O2 Flow Rate: O2 Flow Rate (L/min): 4 L/min  Intake/output summary:   Intake/Output Summary (Last 24 hours) at 12/04/2019 1054 Last data filed at 12/04/2019 1000 Gross per 24 hour  Intake 2833.69 ml  Output 2085 ml  Net 748.69 ml   LBM: Last BM Date: 12/04/19 Baseline Weight: Weight: 90.7 kg Most recent weight: Weight: 102.2 kg       Palliative Assessment/Data:      Patient Active Problem List   Diagnosis Date Noted  . Hepatic encephalopathy syndrome (HCC) Dec 04, 2019  . Alcoholic hepatitis without ascites 11/22/2019  . Tobacco abuse 11/18/2019  . Hematemesis 11/18/2019  . Alcohol abuse   . Stage 3a chronic  kidney disease   . Thrombocytopenia (HCC)   . Essential hypertension   . AF (paroxysmal atrial fibrillation) (HCC)   . Chronic diastolic CHF (congestive heart failure) (HCC)   . Alcoholic cirrhosis (HCC)   . Gastrointestinal hemorrhage 10/06/2019  . COPD exacerbation (HCC) 10/06/2019  . Symptomatic anemia 04/06/2019  . Malnutrition of moderate degree 09/04/2018  . Sepsis (HCC) 08/26/2018  . Acute on chronic systolic CHF (congestive heart failure) (HCC) 08/17/2018  . Lactic acid acidosis 10/25/2017  . Generalized abdominal pain   . Hematemesis with nausea   . Acute blood loss anemia   . Gout attack 02/22/2017    Palliative Care Assessment & Plan    Recommendations/Plan:  Patient would like marriage at this time and to complete a handwritten will. He would then like to stop aggressive care and transition to hospice care.    Code Status:    Code Status Orders  (From admission, onward)         Start     Ordered   12/03/19 1528  Full code  Continuous     12/03/19 1527        Code Status History    Date Active Date Inactive Code Status Order ID Comments User Context   12/03/2019 0851 12/03/2019 1527 DNR 02/02/2020  [id number], MD Inpatient   12/04/19 1626 12/03/2019 0851 Full Code 02/02/2020  [id number], MD ED   11/18/2019 1920 11/22/2019 2017 Full Code 2018  [id number], MD Inpatient   10/06/2019 2015 10/11/2019 0042 Full Code 10/13/2019  Mansy, [id number], MD ED   04/06/2019 1652 04/09/2019 1603 Full Code 06/09/2019  [id number], MD ED   08/26/2018 1724 09/08/2018 2203 Full Code 2204  [id number], MD ED   08/17/2018 0430 08/21/2018 1807 Full Code 08/23/2018  [id number], MD ED   10/25/2017 0147 10/26/2017 1717 Full Code 12/26/2017  Amelia Jo, MD Inpatient   02/22/2017 1122 02/24/2017 1413 Full Code [id number]  Loletha Grayer, MD ED   Advance Care Planning Activity       Prognosis:   < 2 weeks  Discharge Planning:  Family is discussing hospice facility vs  home with hospice.    Thank you for allowing the Palliative Medicine Team to assist in the care of this patient.   Time In: 10:00 Time Out: 11:05 Total Time 65 min Prolonged Time Billed yes      Greater than 50%  of this time was spent counseling and coordinating care related to the above assessment and plan.  Asencion Gowda, NP  Please contact Palliative Medicine Team phone at [phone] for questions and concerns.

## 2019-12-04 NOTE — Progress Notes (Signed)
PT Cancellation Note  Patient Details Name: Larry Andrade. MRN: [id number] DOB: 1960/10/05   Cancelled Treatment:    Reason Eval/Treat Not Completed: Medical issues which prohibited therapy(Consult received and chart reviewed.  Noted plans for palliative care/goals of care conversation with patient/HCPOA at 1000 this morning.  Will hold evaluation until meeting complete and goals of care fully established.)  Kyleena Scheirer H. Manson Passey, PT, DPT, NCS 12/04/19, 8:39 AM [phone]

## 2019-12-04 NOTE — Progress Notes (Signed)
CRITICAL CARE NOTE  59 y.o.malewith medical history significant of alcohol abuse, alcoholic cirrhosis obesity esophageal varices, hypertension, COPD, GERD, gout, pancreatitis, atrial fibrillation on anticoagulants, CKD stage III, thrombocytopenia, tobacco abuse, who presents with hematemesis. Patient has been having recurrenthematemesisbut was not vomiting blood around mouth and chin. Patient has abdominal distention and skin jaundice.active smoking appx 1 pack daily. He is jaundiced and in anasarca. Critical care admission is requested due to encephalopathy, anasarca and possible GIB.    Lines / Drains: PIVx2  Cultures / Sepsis markers: Blood and urine  Antibiotics: Rocephin   Protocols / Consultants: GI, PCCM  6/6 admitted for active GIB from liver cirrhosis with encephalopathy, septic shock 6/7 MRSA bacteremia and STEP PNEUMONIA bacteramia 6/8 active bleeding, worsening jaundice and renal failure   CC  follow up shock  SUBJECTIVE Patient remains critically ill Prognosis is guarded Multiorgan failure Liver, renal failure Alert and awake patient wants to get married and then be made comfort care and hospice   BP (!) 81/62   Pulse (!) 138   Temp 100.2 F (37.9 C) (Oral)   Resp (!) 22   Ht 6' 2"  (1.88 m)   Wt 102.2 kg   SpO2 91%   BMI 28.93 kg/m    I/O last 3 completed shifts: In: 4963.6 [I.V.:2884.8; IV Piggyback:2078.8] Out: 0932 [Urine:910; Stool:2350] Total I/O In: 332.2 [I.V.:202.9; IV Piggyback:129.3] Out: 600 [Urine:600]  SpO2: 91 % O2 Flow Rate (L/min): 4 L/min  Estimated body mass index is 28.93 kg/m as calculated from the following:   Height as of this encounter: 6' 2"  (1.88 m).   Weight as of this encounter: 102.2 kg.   Review of Systems:  Gen: looks very ill, +jaundice HEENT: Denies blurred vision, double vision, ear pain, eye pain, hearing loss, nose bleeds, sore throat Cardiac:  No dizziness, chest pain or heaviness,  chest tightness,edema, No JVD Resp: + cough, +sputum production, +shortness of breath,-wheezing, -hemoptysis,  Gi: Denies swallowing difficulty, stomach pain, nausea or vomiting, diarrhea, constipation, bowel incontinence Gu:  Denies bladder incontinence, burning urine Ext:   Denies Joint pain, stiffness or swelling Skin: +, easy bruising or bleeding or hives Endoc:  Denies polyuria, polydipsia , polyphagia or weight change Psych:   Denies depression, insomnia or hallucinations  Other:  All other systems negative    PHYSICAL EXAMINATION:  GENERAL:critically ill appearing,  HEAD: Normocephalic, atraumatic.  EYES: Pupils equal, round, reactive to light.+ scleral icterus.  MOUTH: Moist mucosal membrane. NECK: Supple.  PULMONARY: +rhonchi,  CARDIOVASCULAR: S1 and S2. Regular rate and rhythm. No murmurs, rubs, or gallops.  GASTROINTESTINAL: Soft, nontender, -distended.  Positive bowel sounds.   MUSCULOSKELETAL: No swelling, clubbing, or edema.  NEUROLOGIC: alert and awake SKIN:intact,warm,dry  MEDICATIONS: I have reviewed all medications and confirmed regimen as documented   CULTURE RESULTS   Recent Results (from the past 240 hour(s))  Culture, blood (routine x 2)     Status: Abnormal (Preliminary result)   Collection Time: 12/06/2019  2:22 PM   Specimen: BLOOD  Result Value Ref Range Status   Specimen Description   Final    BLOOD LEFT ANTECUBITAL Performed at [facility], [address], [place], [place] [address]    Special Requests   Final    BOTTLES DRAWN AEROBIC AND ANAEROBIC Blood Culture adequate volume Performed at [facility], [address], [place], [place] [address]    Culture  Setup Time   Final    Organism ID to follow [place]  BOTH AEROBIC AND ANAEROBIC BOTTLES CRITICAL RESULT CALLED TO, READ BACK BY AND VERIFIED WITH: DAVID BESANTI 12/03/19 AT 0105 HS Performed at [facility], [address], [place], [place]  [address]    Culture (A)  Final    STAPHYLOCOCCUS AUREUS CULTURE REINCUBATED FOR BETTER GROWTH SUSCEPTIBILITIES TO FOLLOW Performed at [facility], [place] [address], [place], [place] [address]    Report Status PENDING  Incomplete  Blood Culture ID Panel (Reflexed)     Status: Abnormal   Collection Time: 12/06/2019  2:22 PM  Result Value Ref Range Status   Enterococcus species NOT DETECTED NOT DETECTED Final   Listeria monocytogenes NOT DETECTED NOT DETECTED Final   Staphylococcus species DETECTED (A) NOT DETECTED Final    Comment: CRITICAL RESULT CALLED TO, READ BACK BY AND VERIFIED WITH: DAVID BESANTI 12/03/19 AT 0105 HS    Staphylococcus aureus (BCID) DETECTED (A) NOT DETECTED Final    Comment: Methicillin (oxacillin)-resistant Staphylococcus aureus (MRSA). MRSA is predictably resistant to beta-lactam antibiotics (except ceftaroline). Preferred therapy is vancomycin unless clinically contraindicated. Patient requires contact precautions if  hospitalized. CRITICAL RESULT CALLED TO, READ BACK BY AND VERIFIED WITH: DAVID BESANTI 12/03/19 AT 0105 HS    Methicillin resistance DETECTED (A) NOT DETECTED Final    Comment: CRITICAL RESULT CALLED TO, READ BACK BY AND VERIFIED WITH: DAVID BESANTI 12/03/19 AT 0105 HS    Streptococcus species DETECTED (A) NOT DETECTED Final    Comment: CRITICAL RESULT CALLED TO, READ BACK BY AND VERIFIED WITH: DAVID BESANTI 12/03/19 AT 0105 HS    Streptococcus agalactiae NOT DETECTED NOT DETECTED Final   Streptococcus pneumoniae DETECTED (A) NOT DETECTED Final    Comment: CRITICAL RESULT CALLED TO, READ BACK BY AND VERIFIED WITH: DAVID BESANTI 12/03/19 AT 0105 HS    Streptococcus pyogenes NOT DETECTED NOT DETECTED Final   Acinetobacter baumannii NOT DETECTED NOT DETECTED Final   Enterobacteriaceae species NOT DETECTED NOT DETECTED Final   Enterobacter cloacae complex NOT DETECTED NOT DETECTED Final   Escherichia coli NOT DETECTED NOT DETECTED Final   Klebsiella  oxytoca NOT DETECTED NOT DETECTED Final   Klebsiella pneumoniae NOT DETECTED NOT DETECTED Final   Proteus species NOT DETECTED NOT DETECTED Final   Serratia marcescens NOT DETECTED NOT DETECTED Final   Haemophilus influenzae NOT DETECTED NOT DETECTED Final   Neisseria meningitidis NOT DETECTED NOT DETECTED Final   Pseudomonas aeruginosa NOT DETECTED NOT DETECTED Final   Candida albicans NOT DETECTED NOT DETECTED Final   Candida glabrata NOT DETECTED NOT DETECTED Final   Candida krusei NOT DETECTED NOT DETECTED Final   Candida parapsilosis NOT DETECTED NOT DETECTED Final   Candida tropicalis NOT DETECTED NOT DETECTED Final    Comment: Performed at [facility], [address] [place], [place] [address]  Culture, blood (routine x 2)     Status: Abnormal (Preliminary result)   Collection Time: 12/17/2019  3:46 PM   Specimen: BLOOD  Result Value Ref Range Status   Specimen Description   Final    BLOOD LEFT ANTECUBITAL Performed at [facility], [address] [place], [place] [address]    Special Requests   Final    BOTTLES DRAWN AEROBIC AND ANAEROBIC Blood Culture results may not be optimal due to an excessive volume of blood received in culture bottles Performed at [facility], [address] [place], [place] [address]    Culture  Setup Time   Final    GRAM POSITIVE COCCI IN BOTH AEROBIC  AND ANAEROBIC BOTTLES CRITICAL VALUE NOTED.  VALUE IS CONSISTENT WITH PREVIOUSLY REPORTED AND CALLED VALUE. Performed at [facility], [address] [place], [place] [address]    Culture (A)  Final    STAPHYLOCOCCUS AUREUS CULTURE REINCUBATED FOR BETTER GROWTH Performed at [facility], [place] [address], [place], [place] [address]    Report Status PENDING  Incomplete  SARS Coronavirus 2 by RT PCR (hospital order, performed in [facility] hospital lab) Nasopharyngeal Nasopharyngeal Swab     Status: None   Collection Time: 12/07/2019  3:46 PM    Specimen: Nasopharyngeal Swab  Result Value Ref Range Status   SARS Coronavirus 2 NEGATIVE NEGATIVE Final    Comment: (NOTE) SARS-CoV-2 target nucleic acids are NOT DETECTED. The SARS-CoV-2 RNA is generally detectable in upper and lower respiratory specimens during the acute phase of infection. The lowest concentration of SARS-CoV-2 viral copies this assay can detect is 250 copies / mL. A negative result does not preclude SARS-CoV-2 infection and should not be used as the sole basis for treatment or other patient management decisions.  A negative result may occur with improper specimen collection / handling, submission of specimen other than nasopharyngeal swab, presence of viral mutation(s) within the areas targeted by this assay, and inadequate number of viral copies (<250 copies / mL). A negative result must be combined with clinical observations, patient history, and epidemiological information. Fact Sheet for Patients:   StrictlyIdeas.no Fact Sheet for Healthcare Providers: BankingDealers.co.za This test is not yet approved or cleared  by the [place] FDA and has been authorized for detection and/or diagnosis of SARS-CoV-2 by FDA under an Emergency Use Authorization (EUA).  This EUA will remain in effect (meaning this test can be used) for the duration of the COVID-19 declaration under Section 564(b)(1) of the Act, 21 U.S.C. section 360bbb-3(b)(1), unless the authorization is terminated or revoked sooner. Performed at [facility], [address] [place], [place] [address]           IMAGING    DG Chest Port 1 View  Result Date: 12/04/2019 CLINICAL DATA:  Acute respiratory failure EXAM: PORTABLE CHEST 1 VIEW COMPARISON:  Two days ago FINDINGS: Patchy bilateral infiltrate which has mildly worsened. Lung volumes are low. Cardiomegaly. No visible effusion or pneumothorax IMPRESSION: 1. Patchy infiltrates with mild  worsening from 2 days ago. 2. Cardiomegaly. Electronically Signed   By: Monte Fantasia M.D.   On: 12/04/2019 07:34   ECHOCARDIOGRAM COMPLETE  Result Date: 12/04/2019    ECHOCARDIOGRAM REPORT   Patient Name:   Larry Andrade. Date of Exam: 12/03/2019 Medical Rec #:  [id number]       Height:       74.0 in Accession #:    [id number]      Weight:       213.4 lb Date of Birth:  Jan 24, 1961       BSA:          2.234 m Patient Age:    5 years        BP:           96/66 mmHg Patient Gender: M               HR:           139 bpm. Exam Location:  [facility] Procedure: 2D Echo, Cardiac Doppler and Color Doppler Indications:     Bacteremia 790.7  History:         Patient has prior  history of Echocardiogram examinations. CHF;                  Risk Factors:Hypertension.  Sonographer:     Alyse Low Roar Referring Phys:  [id number] Bradly Bienenstock Diagnosing Phys: Nelva Bush MD IMPRESSIONS  1. Left ventricular ejection fraction, by estimation, is 55 to 60%. The left ventricle has normal function. The left ventricle has no regional wall motion abnormalities. There is mild left ventricular hypertrophy. Left ventricular diastolic parameters are indeterminate.  2. Right ventricular systolic function is mildly reduced. The right ventricular size is normal. Mildly increased right ventricular wall thickness. There is mildly elevated pulmonary artery systolic pressure. The estimated right ventricular systolic pressure is 67.8 mmHg.  3. Left atrial size was moderately dilated.  4. Right atrial size was mildly dilated.  5. The mitral valve is grossly normal. Mild mitral valve regurgitation.  6. The aortic valve was not well visualized. Aortic valve regurgitation is not visualized. Mild aortic valve sclerosis is present, with no evidence of aortic valve stenosis.  7. The inferior vena cava is dilated in size with <50% respiratory variability, suggesting right atrial pressure of 15 mmHg. FINDINGS  Left Ventricle: Left ventricular ejection  fraction, by estimation, is 55 to 60%. The left ventricle has normal function. The left ventricle has no regional wall motion abnormalities. The left ventricular internal cavity size was normal in size. There is  mild left ventricular hypertrophy. Left ventricular diastolic parameters are indeterminate. Right Ventricle: The right ventricular size is normal. Mildly increased right ventricular wall thickness. Right ventricular systolic function is mildly reduced. There is mildly elevated pulmonary artery systolic pressure. The tricuspid regurgitant velocity is 2.45 m/s, and with an assumed right atrial pressure of 15 mmHg, the estimated right ventricular systolic pressure is 93.8 mmHg. Left Atrium: Left atrial size was moderately dilated. Right Atrium: Right atrial size was mildly dilated. Pericardium: Trivial pericardial effusion is present. Mitral Valve: The mitral valve is grossly normal. Mild mitral annular calcification. Mild mitral valve regurgitation. Tricuspid Valve: The tricuspid valve is normal in structure. Tricuspid valve regurgitation is mild. Aortic Valve: The aortic valve was not well visualized. . There is mild thickening of the aortic valve. Aortic valve regurgitation is not visualized. Mild aortic valve sclerosis is present, with no evidence of aortic valve stenosis. There is mild thickening of the aortic valve. Aortic valve mean gradient measures 9.0 mmHg. Aortic valve peak gradient measures 14.1 mmHg. Aortic valve area, by VTI measures 1.46 cm. Pulmonic Valve: The pulmonic valve was not well visualized. Pulmonic valve regurgitation is trivial. No evidence of pulmonic stenosis. Aorta: The aortic root is normal in size and structure. Pulmonary Artery: The pulmonary artery is not well seen. Venous: The inferior vena cava is dilated in size with less than 50% respiratory variability, suggesting right atrial pressure of 15 mmHg. IAS/Shunts: The interatrial septum was not well visualized.  LEFT  VENTRICLE PLAX 2D LVIDd:         4.87 cm  Diastology LVIDs:         3.65 cm  LV e' lateral:   8.81 cm/s LV PW:         1.21 cm  LV E/e' lateral: 16.3 LV IVS:        1.29 cm  LV e' medial:    7.51 cm/s LVOT diam:     1.80 cm  LV E/e' medial:  19.2 LV SV:         44 LV SV Index:   20  LVOT Area:     2.54 cm  RIGHT VENTRICLE RV Mid diam:    3.53 cm RV S prime:     13.20 cm/s TAPSE (M-mode): 1.8 cm LEFT ATRIUM              Index       RIGHT ATRIUM           Index LA diam:        4.50 cm  2.01 cm/m  RA Area:     20.00 cm LA Vol (A2C):   118.0 ml 52.81 ml/m RA Volume:   54.10 ml  24.21 ml/m LA Vol (A4C):   92.3 ml  41.31 ml/m LA Biplane Vol: 106.0 ml 47.44 ml/m  AORTIC VALVE                    PULMONIC VALVE AV Area (Vmax):    1.53 cm     PV Vmax:        1.00 m/s AV Area (Vmean):   1.35 cm     PV Peak grad:   4.0 mmHg AV Area (VTI):     1.46 cm     RVOT Peak grad: 2 mmHg AV Vmax:           188.00 cm/s AV Vmean:          138.000 cm/s AV VTI:            0.303 m AV Peak Grad:      14.1 mmHg AV Mean Grad:      9.0 mmHg LVOT Vmax:         113.00 cm/s LVOT Vmean:        73.000 cm/s LVOT VTI:          0.174 m LVOT/AV VTI ratio: 0.57  AORTA Ao Root diam: 3.10 cm MITRAL VALVE                TRICUSPID VALVE MV Area (PHT): 4.41 cm     TR Peak grad:   24.0 mmHg MV Decel Time: 172 msec     TR Vmax:        245.00 cm/s MV E velocity: 144.00 cm/s                             SHUNTS                             Systemic VTI:  0.17 m                             Systemic Diam: 1.80 cm Nelva Bush MD Electronically signed by Nelva Bush MD Signature Date/Time: 12/04/2019/7:05:58 AM    Final      Nutrition Status:           ASSESSMENT AND PLAN SYNOPSIS 59 yo admitted for active GIB from liver cirrhosis with encephalopathy, septic shock with progressive multiorgan failure and septic shock +MRSA AND STREP PNEUMONIA Bacteremia  Septic shock -use vasopressors to keep MAP>65 if needed -follow ABG and LA  PRN   ACUTE SYSTOLIC CARDIAC FAILURE- EF -oxygen as needed -Lasix as tolerated -follow up cardiac enzymes as indicated -follow up cardiology recs   ACUTE KIDNEY INJURY/Renal Failure -continue Foley Catheter-assess need -Avoid nephrotoxic agents -Follow urine output, BMP -Ensure adequate renal perfusion, optimize oxygenation -Renal dose medications  CARDIAC ICU monitoring  ID +MRSA/STREP PNEUMONIA -continue IV abx as prescibed -follow up cultures  GI-progressive liver failure +active GIB GI PROPHYLAXIS as indicated   DIET--> as tolerated Constipation protocol as indicated  ENDO - will use ICU hypoglycemic\Hyperglycemia protocol if indicated     ELECTROLYTES -follow labs as needed -replace as needed -pharmacy consultation and following   GI PRX ordered and assessed TRANSFUSIONS AS NEEDED MONITOR FSBS I Assessed the need for Labs I Assessed the need for Foley I Assessed the need for Central Venous Line Family Discussion when available I Assessed the need for Mobilization I made an Assessment of medications to be adjusted accordingly Safety Risk assessment completed   CASE DISCUSSED IN MULTIDISCIPLINARY ROUNDS WITH ICU TEAM  Critical Care Time devoted to patient care services described in this note is 32 minutes.   Overall, patient is critically ill, prognosis is guarded.  Patient with Multiorgan failure and at high risk for cardiac arrest and death.    PALLIATIVE CARE MET WITH PATIENT/FAMILY AND PATIENT WOULD LIKE TO GET MARRIED TO ESTABLISH FINANCIAL POA AND THEN PROCEED WITH COMFORT CARE MEASURES.  Corrin Parker, M.D.  [facility]  Medical Director [place] Director [facility] Cardio-Pulmonary Department

## 2019-12-04 NOTE — Progress Notes (Signed)
CH received referral from morning chaplain informing that pt. is considering marrying his S.O. of 20+ years in order to make it easier for S.O. to access pt.'s benefits after he dies; when [facility] spoke w/Pt.'s RN she shared wedding is not planned as S.O. would have to be married to pt. for one year before being eligible to collect his social security.  CH attempted visits during afternoon but pt. was occupied w/other visitors and medical providers.  Pt. is in multiorgan failure per discussions yesterday and today w/medical team and [facility] anticipates chaplain support may be needed tonight --made referral to evening chaplain to be aware of pt.'s situation.  CH will attempt follow-up visit tomorrow if possible.    12/04/19 1700  Clinical Encounter Type  Visited With Other (Comment);Health care provider [facility])  Visit Type Follow-up  Consult/Referral To Chaplain

## 2019-12-04 NOTE — Progress Notes (Signed)
Pharmacy BCID Antibiotic Note  Larry Andrade. is a 59 y.o. male w/ h/o CHF, pancreatitis, cirrhosis s/t EtOH abuse, esophageal varices s/p EGD and multiple endoscopies and flex sigmoidoscopy admitted on 12/19/2019 with hematemesis. Blood cultures positive for MRSA and strep pneumoniae.   Plan: Reduce dose of vancomycin to 1000 mg IV q24h based on worsening renal function. If antibiotics continue, will tentatively plan to check level tomorrow.  Height: 6\' 2"  (188 cm) Weight: 102.2 kg (225 lb 5 oz) IBW/kg (Calculated) : 82.2  Temp (24hrs), Avg:98.6 F (37 C), Min:97.9 F (36.6 C), Max:100.2 F (37.9 C)  Recent Labs  Lab 12/08/2019 1422 12/04/2019 1721 12/03/19 0114 12/03/19 0238 12/04/19 0547  WBC 23.2*  --  24.2*  --  19.6*  CREATININE 1.97*  --  2.32*  --  2.72*  LATICACIDVEN 7.2* 5.9*  --  6.8*  --     Estimated Creatinine Clearance: 38.7 mL/min (A) (by C-G formula based on SCr of 2.72 mg/dL (H)).    Allergies  Allergen Reactions  . Chlorhexidine     No  midline present  . Penicillins Rash    Has patient had a PCN reaction causing immediate rash, facial/tongue/throat swelling, SOB or lightheadedness with hypotension: No Has patient had a PCN reaction causing severe rash involving mucus membranes or skin necrosis: No Has patient had a PCN reaction that required hospitalization: No Has patient had a PCN reaction occurring within the last 10 years: No If all of the above answers are "NO", then may proceed with Cephalosporin use.     Antimicrobials this admission: 06/06 azithro >> 06/06 06/06 CTX >>  06/06 CFP >> 06/06 06/06 vanc load >> 0606 then to restart 06/07 >>   Microbiology results: 06/06 BCx: MRSA & strep pneumoniae  Thank you for allowing pharmacy to be a part of this patient's care.  08/06, PharmD Clinical Pharmacist 12/04/2019 3:21 PM

## 2019-12-05 DIAGNOSIS — K7200 Acute and subacute hepatic failure without coma: Secondary | ICD-10-CM

## 2019-12-05 LAB — COMPREHENSIVE METABOLIC PANEL
ALT: 34 U/L (ref 0–44)
AST: 57 U/L — ABNORMAL HIGH (ref 15–41)
Albumin: 2.6 g/dL — ABNORMAL LOW (ref 3.5–5.0)
Alkaline Phosphatase: 121 U/L (ref 38–126)
Anion gap: 15 (ref 5–15)
BUN: 75 mg/dL — ABNORMAL HIGH (ref 6–20)
CO2: 21 mmol/L — ABNORMAL LOW (ref 22–32)
Calcium: 7.2 mg/dL — ABNORMAL LOW (ref 8.9–10.3)
Chloride: 96 mmol/L — ABNORMAL LOW (ref 98–111)
Creatinine, Ser: 3.22 mg/dL — ABNORMAL HIGH (ref 0.61–1.24)
GFR calc Af Amer: 24 mL/min — ABNORMAL LOW (ref >60)
GFR calc non Af Amer: 20 mL/min — ABNORMAL LOW (ref >60)
Glucose, Bld: 128 mg/dL — ABNORMAL HIGH (ref 70–99)
Potassium: 3.8 mmol/L (ref 3.5–5.1)
Sodium: 132 mmol/L — ABNORMAL LOW (ref 135–145)
Total Bilirubin: 6.1 mg/dL — ABNORMAL HIGH (ref 0.3–1.2)
Total Protein: 5.9 g/dL — ABNORMAL LOW (ref 6.5–8.1)

## 2019-12-05 LAB — TYPE AND SCREEN
ABO/RH(D): AB POS
Antibody Screen: NEGATIVE
Unit division: 0

## 2019-12-05 LAB — CBC
HCT: 23.4 % — ABNORMAL LOW (ref 39.0–52.0)
Hemoglobin: 7.8 g/dL — ABNORMAL LOW (ref 13.0–17.0)
MCH: 34.4 pg — ABNORMAL HIGH (ref 26.0–34.0)
MCHC: 33.3 g/dL (ref 30.0–36.0)
MCV: 103.1 fL — ABNORMAL HIGH (ref 80.0–100.0)
Platelets: 76 10*3/uL — ABNORMAL LOW (ref 150–400)
RBC: 2.27 MIL/uL — ABNORMAL LOW (ref 4.22–5.81)
RDW: 22 % — ABNORMAL HIGH (ref 11.5–15.5)
WBC: 23.6 10*3/uL — ABNORMAL HIGH (ref 4.0–10.5)
nRBC: 0.1 % (ref 0.0–0.2)

## 2019-12-05 LAB — CULTURE, BLOOD (ROUTINE X 2): Special Requests: ADEQUATE

## 2019-12-05 LAB — HEMOGLOBIN AND HEMATOCRIT, BLOOD
HCT: 22.5 % — ABNORMAL LOW (ref 39.0–52.0)
HCT: 23.3 % — ABNORMAL LOW (ref 39.0–52.0)
Hemoglobin: 7.8 g/dL — ABNORMAL LOW (ref 13.0–17.0)
Hemoglobin: 7.8 g/dL — ABNORMAL LOW (ref 13.0–17.0)

## 2019-12-05 LAB — PHOSPHORUS: Phosphorus: 4.5 mg/dL (ref 2.5–4.6)

## 2019-12-05 LAB — BPAM RBC
Blood Product Expiration Date: 202106182359
ISSUE DATE / TIME: 202106082020
Unit Type and Rh: 1700

## 2019-12-05 LAB — MAGNESIUM: Magnesium: 2 mg/dL (ref 1.7–2.4)

## 2019-12-05 MED ORDER — MIDODRINE HCL 5 MG PO TABS
10.0000 mg | ORAL_TABLET | Freq: Three times a day (TID) | ORAL | Status: DC
  Administered 2019-12-05 – 2019-12-08 (×8): 10 mg via ORAL
  Filled 2019-12-05 (×9): qty 2

## 2019-12-05 MED ORDER — SODIUM CHLORIDE 0.9 % IV SOLN
300.0000 mg | Freq: Three times a day (TID) | INTRAVENOUS | Status: DC
  Administered 2019-12-05 – 2019-12-09 (×11): 300 mg via INTRAVENOUS
  Filled 2019-12-05 (×16): qty 300

## 2019-12-05 NOTE — Progress Notes (Signed)
Assisted tele visit to patient with family member.  Thomas, Simon Llamas Renee, RN   

## 2019-12-05 NOTE — TOC Initial Note (Signed)
Transition of Care (TOC) - Initial/Assessment Note    Patient Details  Name: Larry Andrade. MRN: [id number] Date of Birth: 1961/06/06  Transition of Care [facility]) CM/SW Contact:    Larry Cline, LCSW Phone Number: 12/05/2019, 1:31 PM  Clinical Narrative:          Spoke with MD who reported if we can arrange home with hospice this would be patient's wishes.   CSW attempted call to significant other, Larry Andrade, no answer. Voicemail box was full.  CSW followed up with patient via phone as he is on Contact Precautions. Patient says he would like to go home and see his dogs one last time. Explained hospice agency options. He reported he would like to use [facility] for [employer]. He reported he will need a wheelchair and hospital bed, per chart he would also need oxygen. Patient says he lives with his significant other, Larry Andrade, and will update her. Encouraged him to have her call with questions.  Referral made to [employer]. RNCM Misty will be covering this afternoon and will follow up based on needs.           Expected Discharge Plan: Home w Hospice Care     Patient Goals and CMS Choice Patient states their goals for this hospitalization and ongoing recovery are:: to return home with hospice services CMS Medicare.gov Compare Post Acute Care list provided to:: Patient Choice offered to / list presented to : Patient  Expected Discharge Plan and Services Expected Discharge Plan: Home w Hospice Care     Post Acute Care Choice: Hospice Living arrangements for the past 2 months: Single Family Home                                      Prior Living Arrangements/Services Living arrangements for the past 2 months: Single Family Home Lives with:: Significant Other Patient language and need for interpreter reviewed:: Yes Do you feel safe going back to the place where you live?: Yes      Need for Family Participation in Patient Care: Yes (Comment) Care  giver support system in place?: Yes (comment)   Criminal Activity/Legal Involvement Pertinent to Current Situation/Hospitalization: No - Comment as needed  Activities of Daily Living Home Assistive Devices/Equipment: Dan Humphreys (specify type) ADL Screening (condition at time of admission) Patient's cognitive ability adequate to safely complete daily activities?: Yes Is the patient deaf or have difficulty hearing?: No Does the patient have difficulty seeing, even when wearing glasses/contacts?: No Does the patient have difficulty concentrating, remembering, or making decisions?: No Patient able to express need for assistance with ADLs?: Yes Does the patient have difficulty dressing or bathing?: No Independently performs ADLs?: Yes (appropriate for developmental age) Does the patient have difficulty walking or climbing stairs?: Yes Weakness of Legs: Both Weakness of Arms/Hands: Both  Permission Sought/Granted Permission sought to share information with : Oceanographer granted to share information with : Yes, Verbal Permission Granted     Permission granted to share info w AGENCY: [facility]        Emotional Assessment   Attitude/Demeanor/Rapport: Engaged   Orientation: : Oriented to Self, Oriented to Place, Oriented to  Time, Oriented to Situation Alcohol / Substance Use: Not Applicable Psych Involvement: No (comment)  Admission diagnosis:  Hepatic encephalopathy syndrome (HCC) [K72.90] Sepsis, due to unspecified organism, unspecified whether acute organ dysfunction present [facility]) [A41.9] Patient  Active Problem List   Diagnosis Date Noted  . Hepatic encephalopathy syndrome ([place]) 12/18/2019  . Alcoholic hepatitis without ascites 11/22/2019  . Tobacco abuse 11/18/2019  . Hematemesis 11/18/2019  . Alcohol abuse   . Stage 3a chronic kidney disease   . Thrombocytopenia ([place])   . Essential hypertension   . AF (paroxysmal atrial fibrillation)  ([place])   . Chronic diastolic CHF (congestive heart failure) ([place])   . Alcoholic cirrhosis ([place])   . Gastrointestinal hemorrhage 10/06/2019  . COPD exacerbation ([place]) 10/06/2019  . Symptomatic anemia 04/06/2019  . Malnutrition of moderate degree 09/04/2018  . Sepsis ([place]) 08/26/2018  . Acute on chronic systolic CHF (congestive heart failure) ([place]) 08/17/2018  . Lactic acid acidosis 10/25/2017  . Generalized abdominal pain   . Hematemesis with nausea   . Acute blood loss anemia   . Gout attack 02/22/2017   PCP:  Rutherford Limerick, PA Pharmacy:   [id number] [place], [place] [address] Phone: [phone] Fax: [phone]     Social Determinants of Health (SDOH) Interventions    Readmission Risk Interventions Readmission Risk Prevention Plan 11/19/2019 10/08/2019 04/09/2019  Transportation Screening Complete Complete Complete  PCP or Specialist Appt within 3-5 Days Complete Complete Complete  HRI or Home Care Consult Complete Complete Complete  Social Work Consult for [place] Planning/Counseling - Patient refused Patient refused  Palliative Care Screening Complete Not Applicable Not Applicable  Medication Review (RN Care Manager) Complete Complete Referral to Pharmacy  Some recent data might be hidden

## 2019-12-05 NOTE — Progress Notes (Signed)
Pharmacy BCID Antibiotic Note  Larry Andrade. is a 59 y.o. male w/ h/o CHF, pancreatitis, cirrhosis s/t EtOH abuse, esophageal varices s/p EGD and multiple endoscopies and flex sigmoidoscopy admitted on 12-16-19 with hematemesis. Blood cultures positive for MRSA and strep pneumoniae.  Today, 12/05/2019  Day 3 vanco and ceftriaxone  SCr continues to worsen (hepatorenal, vancomycin)  Goals of care discussions - per notes, current plan is residential hospice when bed available   Plan:  Worsening renal function, change ceftriaxone and vancomycin to ceftaroline 300mg  IV q8h - dosed for MRSA bacteremia and adjusted for worsening SCr  Monitor renal function  Follow goals of care   Height: 6\' 2"  (188 cm) Weight: 102.6 kg (226 lb 3.1 oz) IBW/kg (Calculated) : 82.2  Temp (24hrs), Avg:97.8 F (36.6 C), Min:97.5 F (36.4 C), Max:97.9 F (36.6 C)  Recent Labs  Lab 12-16-2019 1422 December 16, 2019 1721 12/03/19 0114 12/03/19 0238 12/04/19 0547 12/05/19 0339  WBC 23.2*  --  24.2*  --  19.6* 23.6*  CREATININE 1.97*  --  2.32*  --  2.72* 3.22*  LATICACIDVEN 7.2* 5.9*  --  6.8*  --   --     Estimated Creatinine Clearance: 32.8 mL/min (A) (by C-G formula based on SCr of 3.22 mg/dL (H)).    Allergies  Allergen Reactions  . Chlorhexidine     No  midline present  . Penicillins Rash    Has patient had a PCN reaction causing immediate rash, facial/tongue/throat swelling, SOB or lightheadedness with hypotension: No Has patient had a PCN reaction causing severe rash involving mucus membranes or skin necrosis: No Has patient had a PCN reaction that required hospitalization: No Has patient had a PCN reaction occurring within the last 10 years: No If all of the above answers are "NO", then may proceed with Cephalosporin use.     Antimicrobials this admission: 06/06 azithro >> 06/06 06/06 CTX >> 6/9 06/06 CFP >> 06/06 06/06 vanc load >> 0606 then to restart 06/07 >> 6/9 6/9  ceftaroline   Microbiology results: 06/06 BCx: MRSA & strep pneumoniae  Thank you for allowing pharmacy to be a part of this patient's care.  8/9, PharmD, BCPS.   Work Cell: [phone] 12/05/2019 4:45 PM

## 2019-12-05 NOTE — Progress Notes (Addendum)
CRITICAL CARE NOTE  59 y.o.malewith medical history significant of alcohol abuse, alcoholic cirrhosis obesity esophageal varices, hypertension, COPD, GERD, gout, pancreatitis, atrial fibrillation on anticoagulants, CKD stage III, thrombocytopenia, tobacco abuse, who presents with hematemesis. Patient has been having recurrenthematemesisbut was not vomiting blood around mouth and chin. Patient has abdominal distention and skin jaundice.active smoking appx 1 pack daily. He is jaundiced and in anasarca. Critical care admission is requested due to encephalopathy, anasarca and possible GIB.    Lines / Drains: PIVx2  Cultures / Sepsis markers: Blood and urine  Antibiotics: Rocephin   Protocols / Consultants: GI, PCCM  6/6 admitted for active GIB from liver cirrhosis with encephalopathy, septic shock 6/7 MRSA bacteremia and STEP PNEUMONIA bacteramia 6/8 active bleeding, worsening jaundice and renal failure 6/9 active bleeding, shock, on pressors    CC  follow up shock  SUBJECTIVE Patient remains critically ill Prognosis is guarded Multiorgan failure  BMP Latest Ref Rng & Units 12/05/2019 12/04/2019 12/03/2019  Glucose 70 - 99 mg/dL 124(P) 809(X) 833(A)  BUN 6 - 20 mg/dL 25(K) 53(Z) 76(B)  Creatinine 0.61 - 1.24 mg/dL 3.41(P) 3.79(K) 2.40(X)  Sodium 135 - 145 mmol/L 132(L) 132(L) 132(L)  Potassium 3.5 - 5.1 mmol/L 3.8 4.0 4.4  Chloride 98 - 111 mmol/L 96(L) 96(L) 95(L)  CO2 22 - 32 mmol/L 21(L) 22 20(L)  Calcium 8.9 - 10.3 mg/dL 7.2(L) 7.0(L) 7.0(L)      BP 93/67   Pulse 97   Temp 97.7 F (36.5 C) (Oral)   Resp 14   Ht 6\' 2"  (1.88 m)   Wt 102.6 kg   SpO2 98%   BMI 29.04 kg/m    I/O last 3 completed shifts: In: 4906.9 [I.V.:3401.5; Blood:360; IV Piggyback:1145.4] Out: 2085 [Urine:1085; Stool:1000] No intake/output data recorded.  SpO2: 98 % O2 Flow Rate (L/min): 4 L/min  Estimated body mass index is 29.04 kg/m as calculated from the following:    Height as of this encounter: 6\' 2"  (1.88 m).   Weight as of this encounter: 102.6 kg.   Review of Systems:  Gen:  +fatigue,+pain Resp:   +shortness of breath Gi: Denies swallowing difficulty, stomach pain, nausea or vomiting, diarrhea, constipation, bowel incontinence Gu:  Denies bladder incontinence, burning urine Ext:   Denies Joint pain, stiffness or swelling Skin: Denies  skin rash, easy bruising or bleeding or hives Endoc:  Denies polyuria, polydipsia , polyphagia or weight change Psych:   Denies depression, insomnia or hallucinations  Other:  All other systems negative       PHYSICAL EXAMINATION:  GENERAL:critically ill appearing,  HEAD: Normocephalic, atraumatic.  EYES: Pupils equal, round, reactive to light.  No scleral icterus.  MOUTH: Moist mucosal membrane. NECK: Supple.  PULMONARY: +rhonchi,  CARDIOVASCULAR: S1 and S2. Regular rate and rhythm. No murmurs, rubs, or gallops.  GASTROINTESTINAL: -tender, +distended.  Positive bowel sounds.   MUSCULOSKELETAL: +edema.  NEUROLOGIC: intermittent lethargy SKIN:intact,warm,dry  MEDICATIONS: I have reviewed all medications and confirmed regimen as documented   CULTURE RESULTS   Recent Results (from the past 240 hour(s))  Culture, blood (routine x 2)     Status: Abnormal   Collection Time: 12/17/2019  2:22 PM   Specimen: BLOOD  Result Value Ref Range Status   Specimen Description   Final    BLOOD LEFT ANTECUBITAL Performed at [facility], [address] [place], [address] [place]    Special Requests   Final    BOTTLES DRAWN AEROBIC AND ANAEROBIC Blood Culture adequate volume Performed at  [facility], [address] [place], [place] [address]    Culture  Setup Time   Final    Organism ID to follow GRAM POSITIVE COCCI IN BOTH AEROBIC AND ANAEROBIC BOTTLES CRITICAL RESULT CALLED TO, READ BACK BY AND VERIFIED WITH: DAVID BESANTI 12/03/19 AT 0105 HS Performed at [facility], [address] [place], [place] [address]    Culture (A)  Final    METHICILLIN RESISTANT STAPHYLOCOCCUS AUREUS STREPTOCOCCUS PNEUMONIAE nonviable    Report Status 12/05/2019 FINAL  Final   Organism ID, Bacteria METHICILLIN RESISTANT STAPHYLOCOCCUS AUREUS  Final      Susceptibility   Methicillin resistant staphylococcus aureus - MIC*    CIPROFLOXACIN 1 SENSITIVE Sensitive     ERYTHROMYCIN RESISTANT Resistant     GENTAMICIN <=0.5 SENSITIVE Sensitive     OXACILLIN >=4 RESISTANT Resistant     TETRACYCLINE <=1 SENSITIVE Sensitive     VANCOMYCIN 1 SENSITIVE Sensitive     TRIMETH/SULFA <=10 SENSITIVE Sensitive     CLINDAMYCIN RESISTANT Resistant     RIFAMPIN <=0.5 SENSITIVE Sensitive     Inducible Clindamycin POSITIVE Resistant     * METHICILLIN RESISTANT STAPHYLOCOCCUS AUREUS  Blood Culture ID Panel (Reflexed)     Status: Abnormal   Collection Time: 12/25/2019  2:22 PM  Result Value Ref Range Status   Enterococcus species NOT DETECTED NOT DETECTED Final   Listeria monocytogenes NOT DETECTED NOT DETECTED Final   Staphylococcus species DETECTED (A) NOT DETECTED Final    Comment: CRITICAL RESULT CALLED TO, READ BACK BY AND VERIFIED WITH: DAVID BESANTI 12/03/19 AT 0105 HS    Staphylococcus aureus (BCID) DETECTED (A) NOT DETECTED Final    Comment: Methicillin (oxacillin)-resistant Staphylococcus aureus (MRSA). MRSA is predictably resistant to beta-lactam antibiotics (except ceftaroline). Preferred therapy is vancomycin unless clinically contraindicated. Patient requires contact precautions if  hospitalized. CRITICAL RESULT CALLED TO, READ BACK BY AND VERIFIED WITH: DAVID BESANTI 12/03/19 AT 0105 HS    Methicillin resistance DETECTED (A) NOT DETECTED Final    Comment: CRITICAL RESULT CALLED TO, READ BACK BY AND VERIFIED WITH: DAVID BESANTI 12/03/19 AT 0105 HS    Streptococcus species DETECTED (A) NOT DETECTED Final    Comment: CRITICAL RESULT CALLED TO, READ BACK BY AND VERIFIED WITH: DAVID  BESANTI 12/03/19 AT 0105 HS    Streptococcus agalactiae NOT DETECTED NOT DETECTED Final   Streptococcus pneumoniae DETECTED (A) NOT DETECTED Final    Comment: CRITICAL RESULT CALLED TO, READ BACK BY AND VERIFIED WITH: DAVID BESANTI 12/03/19 AT 0105 HS    Streptococcus pyogenes NOT DETECTED NOT DETECTED Final   Acinetobacter baumannii NOT DETECTED NOT DETECTED Final   Enterobacteriaceae species NOT DETECTED NOT DETECTED Final   Enterobacter cloacae complex NOT DETECTED NOT DETECTED Final   Escherichia coli NOT DETECTED NOT DETECTED Final   Klebsiella oxytoca NOT DETECTED NOT DETECTED Final   Klebsiella pneumoniae NOT DETECTED NOT DETECTED Final   Proteus species NOT DETECTED NOT DETECTED Final   Serratia marcescens NOT DETECTED NOT DETECTED Final   Haemophilus influenzae NOT DETECTED NOT DETECTED Final   Neisseria meningitidis NOT DETECTED NOT DETECTED Final   Pseudomonas aeruginosa NOT DETECTED NOT DETECTED Final   Candida albicans NOT DETECTED NOT DETECTED Final   Candida glabrata NOT DETECTED NOT DETECTED Final   Candida krusei NOT DETECTED NOT DETECTED Final   Candida parapsilosis NOT DETECTED NOT DETECTED Final   Candida tropicalis NOT DETECTED NOT DETECTED Final    Comment: Performed at [facility], [address] [place], [place] [address]  Culture, blood (routine x 2)     Status: Abnormal   Collection Time: 11/27/2019  3:46 PM   Specimen: BLOOD  Result Value Ref Range Status   Specimen Description   Final    BLOOD LEFT ANTECUBITAL Performed at [facility], [address] [place], [place] [address]    Special Requests   Final    BOTTLES DRAWN AEROBIC AND ANAEROBIC Blood Culture results may not be optimal due to an excessive volume of blood received in culture bottles Performed at [facility], [address], [place], [place] [address]    Culture  Setup Time   Final    GRAM POSITIVE COCCI IN BOTH AEROBIC AND ANAEROBIC BOTTLES CRITICAL VALUE  NOTED.  VALUE IS CONSISTENT WITH PREVIOUSLY REPORTED AND CALLED VALUE. Performed at [facility], [address] [place], [place] [address]    Culture STAPHYLOCOCCUS AUREUS (A)  Final   Report Status 12/05/2019 FINAL  Final  SARS Coronavirus 2 by RT PCR (hospital order, performed in [facility] hospital lab) Nasopharyngeal Nasopharyngeal Swab     Status: None   Collection Time: 11/29/2019  3:46 PM   Specimen: Nasopharyngeal Swab  Result Value Ref Range Status   SARS Coronavirus 2 NEGATIVE NEGATIVE Final    Comment: (NOTE) SARS-CoV-2 target nucleic acids are NOT DETECTED. The SARS-CoV-2 RNA is generally detectable in upper and lower respiratory specimens during the acute phase of infection. The lowest concentration of SARS-CoV-2 viral copies this assay can detect is 250 copies / mL. A negative result does not preclude SARS-CoV-2 infection and should not be used as the sole basis for treatment or other patient management decisions.  A negative result may occur with improper specimen collection / handling, submission of specimen other than nasopharyngeal swab, presence of viral mutation(s) within the areas targeted by this assay, and inadequate number of viral copies (<250 copies / mL). A negative result must be combined with clinical observations, patient history, and epidemiological information. Fact Sheet for Patients:   BoilerBrush.com.cy Fact Sheet for Healthcare Providers: https://pope.com/ This test is not yet approved or cleared  by the [place] FDA and has been authorized for detection and/or diagnosis of SARS-CoV-2 by FDA under an Emergency Use Authorization (EUA).  This EUA will remain in effect (meaning this test can be used) for the duration of the COVID-19 declaration under Section 564(b)(1) of the Act, 21 U.S.C. section 360bbb-3(b)(1), unless the authorization is terminated or revoked sooner. Performed at  [facility], [address] [place], [place] [address]   Urine culture     Status: None   Collection Time: 12/03/19  8:19 PM   Specimen: Urine, Random  Result Value Ref Range Status   Specimen Description   Final    URINE, RANDOM Performed at [facility], [address] [place], [place] [address]    Special Requests   Final    NONE Performed at [facility], [address] [place], [place] [address]    Culture   Final    NO GROWTH Performed at [facility], [address], [place], [place] [address]    Report Status 12/04/2019 FINAL  Final            Indwelling Urinary Catheter continued, requirement due to   Reason to continue Indwelling Urinary Catheter strict Intake/Output monitoring for hemodynamic instability      ASSESSMENT AND PLAN SYNOPSIS  59 yo admitted for active  GIB from liver cirrhosis with encephalopathy, septic shock with progressive multiorgan failure and septic shock +MRSA AND STREP PNEUMONIA Bacteremia   Morbid obesity, possible OSA.   Will certainly impact respiratory mechanics  ACUTE KIDNEY INJURY/Renal Failure -continue Foley Catheter-assess need -Avoid nephrotoxic agents -Follow urine output, BMP -Ensure adequate renal perfusion, optimize oxygenation -Renal dose medications   SHOCK-SEPSIS/CARDIOGENIC -use vasopressors to keep MAP>65 -follow ABG and LA PRN -stress dose steroids   CARDIAC ICU monitoring  ID MRSA/STREP PNEUMONIA BACTEREMIA -continue IV abx as prescibed -follow up cultures  GI-ACTIVE GIB GI STATES NOTHING ELSE TO OFFER GI PROPHYLAXIS as indicated  NUTRITIONAL STATUS Nutrition Status:         DIET--> as tolerated Constipation protocol as indicated  ENDO - will use ICU hypoglycemic\Hyperglycemia protocol if indicated     ELECTROLYTES -follow labs as needed -replace as needed -pharmacy consultation and following   DVT/GI PRX ordered and  assessed TRANSFUSIONS AS NEEDED MONITOR FSBS I Assessed the need for Labs I Assessed the need for Foley I Assessed the need for Central Venous Line Family Discussion when available I Assessed the need for Mobilization I made an Assessment of medications to be adjusted accordingly Safety Risk assessment completed   CASE DISCUSSED IN MULTIDISCIPLINARY ROUNDS WITH ICU TEAM  Critical Care Time devoted to patient care services described in this note is 32 minutes.   Overall, patient is critically ill, prognosis is guarded.  Patient with Multiorgan failure and at high risk for cardiac arrest and death.   PATIENT IS IN DYING PROCESS RECOMMEND COMFORT CARE MEASURES   Kadince Boxley Patricia Pesa, M.D.  [facility]  Medical Director [place] Director [facility] Cardio-Pulmonary Department

## 2019-12-05 NOTE — Progress Notes (Signed)
The Clinical status was relayed to family in detail. Patient is AOx4, Girlfriend [name], and Mother at bedside  Updated and notified of patients medical condition.  Explained to family course of therapy and the modalities     Patient with Progressive multiorgan failure with very low chance of meaningful recovery despite all aggressive and optimal medical therapy.  Patient is in the dying  Process associated with suffering.  Patient and Family understands the situation.  The patient and family have consented and agreed to DNR/DNI. We dicussed that kidney function is also worsening and that patient would NOT want and would NOT tolerate  Hemodialysis and it will prolong suffering.   I have discussed lifting his dietary restrictions and let him eat whatever he wants and lift stringent  visitation policy   Patient and Family are satisfied with Plan of action and management. All questions answered  Additional CC time 32 mins   Seairra Otani Santiago Glad, M.D.  [facility]  Medical Director [facility] Medical Director [facility] Cardio-Pulmonary Department

## 2019-12-05 NOTE — Progress Notes (Signed)
PHARMACY CONSULT NOTE - FOLLOW UP  Pharmacy Consult for Electrolyte Monitoring and Replacement   Recent Labs: Potassium (mmol/L)  Date Value  12/05/2019 3.8   Magnesium (mg/dL)  Date Value  55/25/8948 2.0   Calcium (mg/dL)  Date Value  34/75/8307 7.2 (L)   Calcium, Total (PTH) (mg/dL)  Date Value  46/00/2984 9.4   Albumin (g/dL)  Date Value  73/01/5693 2.6 (L)   Phosphorus (mg/dL)  Date Value  37/00/5259 4.5   Sodium (mmol/L)  Date Value  12/05/2019 132 (L)     Assessment: Patient is a 59 y/o M with history of end stage liver disease, alcohol abuse, esophageal varices, atrial fibrillation, COPD who is admitted to the ICU with possible GIB, septic shock secondary to bacteremia, acute renal failure. Pharmacy has been consulted to assist with electrolyte repletion.  Goal of Therapy:  K 4 Mg 2 All other electrolytes WNL   Plan:  No replacement. Will defer ordering electrolytes with morning labs.  Pricilla Riffle, PharmD 12/05/2019 3:45 PM

## 2019-12-05 NOTE — Progress Notes (Signed)
Daily Progress Note   Patient Name: Larry Andrade.       Date: 12/05/2019 DOB: 08-12-1960  Age: 59 y.o. MRN#: [id number] Attending Physician: Flora Lipps, MD Primary Care Physician: Rutherford Limerick, PA Admit Date: Dec 08, 2019  Reason for Consultation/Follow-up: Establishing goals of care  Subjective: Patient is resting in bed. His significant other is at bedside. They state all paperwork has been completed. They are formulating a visitor list and patient is E link visiting a friend in [place]. Hospice facility bed has been requested per their desire; no beds available today. Patient now DNR/DNI per primary team. He would like to continue treating the treatable until he has been able to visit with the list of friends and family.    Length of Stay: 3  Current Medications: Scheduled Meds:   sodium chloride   Intravenous Once   allopurinol  100 mg Oral BID   folic acid  1 mg Oral Daily   lactulose  30 g Oral BID   levalbuterol  0.63 mg Nebulization Q6H   mouth rinse  15 mL Mouth Rinse BID   metoprolol succinate  25 mg Oral BID   midodrine  10 mg Oral TID WC   nicotine  21 mg Transdermal Daily   [START ON 12/06/2019] pantoprazole  40 mg Intravenous Q12H   thiamine  100 mg Oral Daily    Continuous Infusions:  sodium chloride Stopped (12/03/19 0611)   sodium chloride     sodium chloride     amiodarone 60 mg/hr (12/05/19 1434)   cefTRIAXone (ROCEPHIN)  IV Stopped (12/05/19 0626)   octreotide  (SANDOSTATIN)    IV infusion 50 mcg/hr (12/05/19 1400)   pantoprozole (PROTONIX) infusion 8 mg/hr (12/05/19 1400)   phenylephrine (NEO-SYNEPHRINE) Adult infusion 100 mcg/min (12/05/19 1400)   vancomycin Stopped (12/05/19 0556)    PRN Meds: sodium chloride, docusate sodium,  guaiFENesin, levalbuterol, morphine injection, ondansetron (ZOFRAN) IV, oxyCODONE, polyethylene glycol  Physical Exam Pulmonary:     Effort: Pulmonary effort is normal.  Neurological:     Mental Status: He is alert.             Vital Signs: BP 92/66    Pulse (!) 115    Temp 97.7 F (36.5 C) (Oral)    Resp 18    Ht 6\' 2"  (1.88  m)    Wt 102.6 kg    SpO2 (!) 80%    BMI 29.04 kg/m  SpO2: SpO2: (!) 80 % O2 Device: O2 Device: Nasal Cannula O2 Flow Rate: O2 Flow Rate (L/min): 3 L/min  Intake/output summary:   Intake/Output Summary (Last 24 hours) at 12/05/2019 1458 Last data filed at 12/05/2019 1400 Gross per 24 hour  Intake 4826.65 ml  Output 225 ml  Net 4601.65 ml   LBM: Last BM Date: 12/04/19 Baseline Weight: Weight: 90.7 kg Most recent weight: Weight: 102.6 kg       Palliative Assessment/Data:      Patient Active Problem List   Diagnosis Date Noted   Hepatic encephalopathy syndrome (HCC) 12/21/2019   Alcoholic hepatitis without ascites 11/22/2019   Tobacco abuse 11/18/2019   Hematemesis 11/18/2019   Alcohol abuse    Stage 3a chronic kidney disease    Thrombocytopenia (HCC)    Essential hypertension    AF (paroxysmal atrial fibrillation) (HCC)    Chronic diastolic CHF (congestive heart failure) (HCC)    Alcoholic cirrhosis (HCC)    Gastrointestinal hemorrhage 10/06/2019   COPD exacerbation (HCC) 10/06/2019   Symptomatic anemia 04/06/2019   Malnutrition of moderate degree 09/04/2018   Sepsis (HCC) 08/26/2018   Acute on chronic systolic CHF (congestive heart failure) (HCC) 08/17/2018   Lactic acid acidosis 10/25/2017   Generalized abdominal pain    Hematemesis with nausea    Acute blood loss anemia    Gout attack 02/22/2017    Palliative Care Assessment & Plan    Recommendations/Plan:  Patient requesting bed at hospice facility. He is currently creating a list of friends and family to visit, and states he will then look at comfort based  care.    Code Status:    Code Status Orders  (From admission, onward)         Start     Ordered   12/03/19 1528  Full code  Continuous     12/03/19 1527        Code Status History    Date Active Date Inactive Code Status Order ID Comments User Context   12/03/2019 0851 12/03/2019 1527 DNR [id number]  Erin Fulling, MD Inpatient   12/01/2019 1626 12/03/2019 0851 Full Code [id number]  Vida Rigger, MD ED   11/18/2019 1920 11/22/2019 2017 Full Code [id number]  Lorretta Harp, MD Inpatient   10/06/2019 2015 10/11/2019 0042 Full Code [id number]  Mansy, Vernetta Honey, MD ED   04/06/2019 1652 04/09/2019 1603 Full Code [id number]  Auburn Bilberry, MD ED   08/26/2018 1724 09/08/2018 2203 Full Code [id number]  Adrian Saran, MD ED   08/17/2018 0430 08/21/2018 1807 Full Code [id number]  Arnaldo Natal, MD ED   10/25/2017 0147 10/26/2017 1717 Full Code [id number]  Cammy Copa, MD Inpatient   02/22/2017 1122 02/24/2017 1413 Full Code [id number]  Alford Highland, MD ED   Advance Care Planning Activity       Prognosis:   < 2 weeks  Discharge Planning:  Family is discussing hospice facility vs home with hospice.    Thank you for allowing the Palliative Medicine Team to assist in the care of this patient.   Total Time 35 min Prolonged Time Billed no      Greater than 50%  of this time was spent counseling and coordinating care related to the above assessment and plan.  Morton Stall, NP  Please contact Palliative Medicine Team phone at [phone] for questions and concerns.

## 2019-12-05 NOTE — Progress Notes (Signed)
Patient remains AOx4.  Family has been by bedside for majority of the day.  Coordinated with medical and Nursing administration to allow patient to have end of life therapy because he is expected to pass within the next couple of days.  Hospice and Palliative care came to bedside to consult and explain course of care.  Patient remains in good spirits.  He has been receiving Amiodarone, Neo, Protonix, Octreotide for the majority of the shift.  Explained to family that when pressor medicines are discontinued, patient will most likely lose life supporting B/P.  Delivered Oxycodone 2 times and Morphine 2mg  PRN IV push once today.  He had 6 B/M with excessive blood.  Patient In/out cath with return.

## 2019-12-05 NOTE — Progress Notes (Signed)
PT Cancellation Note  Patient Details Name: Larry Andrade. MRN: [id number] DOB: 14-Jul-1960   Cancelled Treatment:    Reason Eval/Treat Not Completed: Medical issues which prohibited therapy(Per chart review, patient remains tachycardic, hypotensive and in multi-organ failure; planning for transition to comfort care.  Will complete PT order at this time; please re-consult should needs/goals of care change.)   [name] H. Manson Passey, PT, DPT, NCS 12/05/19, 10:38 AM [phone]

## 2019-12-05 NOTE — Progress Notes (Signed)
[facility] Liaison note:  New referral for [facility] hospice home received from Palliative NP [employer]. TOC Misty Green updated.  Writer met in the room with patient and his girl friend Elmyra Ricks to initiate education regarding hospice services and philosophy as well as current visitation policy with understanding voiced. Questions answered, emotional support given.  [facility] is unable to offer a bed today.  Patient and hospital care team aware. Patient information has been sent to referral.  Hospice eligibility has been confirmed.  Hospital Liaison team will continue to follow and update family and hospital care team regarding bed availability.  Thank you for the opportunity to be involved in the care of this patient and his family.  Flo Shanks BSN, RN, [place] [phone]

## 2019-12-05 NOTE — Progress Notes (Signed)
Patient is AOx4.  He is aggravated d/t being sequestered in an isolation room.  He stated he had 8/10 pain.  Delivered PO narcotics 10mg .  He is receiving Amiodarone 60mg /hr; Neo 15mcg/min; Octreotide 78mcg/hr; Protonix 8mg /hr.  Called and spoke with 80m and asked her to bring patients mother to meet with Dr. 45m and I to establish a plan of care based upon patients declining health.  Family in route.

## 2019-12-06 DIAGNOSIS — J9601 Acute respiratory failure with hypoxia: Secondary | ICD-10-CM

## 2019-12-06 DIAGNOSIS — J9600 Acute respiratory failure, unspecified whether with hypoxia or hypercapnia: Secondary | ICD-10-CM

## 2019-12-06 LAB — COMPREHENSIVE METABOLIC PANEL
ALT: 38 U/L (ref 0–44)
AST: 72 U/L — ABNORMAL HIGH (ref 15–41)
Albumin: 2.3 g/dL — ABNORMAL LOW (ref 3.5–5.0)
Alkaline Phosphatase: 191 U/L — ABNORMAL HIGH (ref 38–126)
Anion gap: 16 — ABNORMAL HIGH (ref 5–15)
BUN: 79 mg/dL — ABNORMAL HIGH (ref 6–20)
CO2: 18 mmol/L — ABNORMAL LOW (ref 22–32)
Calcium: 7.4 mg/dL — ABNORMAL LOW (ref 8.9–10.3)
Chloride: 98 mmol/L (ref 98–111)
Creatinine, Ser: 3.63 mg/dL — ABNORMAL HIGH (ref 0.61–1.24)
GFR calc Af Amer: 20 mL/min — ABNORMAL LOW (ref >60)
GFR calc non Af Amer: 18 mL/min — ABNORMAL LOW (ref >60)
Glucose, Bld: 96 mg/dL (ref 70–99)
Potassium: 3.9 mmol/L (ref 3.5–5.1)
Sodium: 132 mmol/L — ABNORMAL LOW (ref 135–145)
Total Bilirubin: 5.8 mg/dL — ABNORMAL HIGH (ref 0.3–1.2)
Total Protein: 5.8 g/dL — ABNORMAL LOW (ref 6.5–8.1)

## 2019-12-06 LAB — CBC
HCT: 22 % — ABNORMAL LOW (ref 39.0–52.0)
Hemoglobin: 7.8 g/dL — ABNORMAL LOW (ref 13.0–17.0)
MCH: 35.5 pg — ABNORMAL HIGH (ref 26.0–34.0)
MCHC: 35.5 g/dL (ref 30.0–36.0)
MCV: 100 fL (ref 80.0–100.0)
Platelets: 88 10*3/uL — ABNORMAL LOW (ref 150–400)
RBC: 2.2 MIL/uL — ABNORMAL LOW (ref 4.22–5.81)
RDW: 22.6 % — ABNORMAL HIGH (ref 11.5–15.5)
WBC: 30.1 10*3/uL — ABNORMAL HIGH (ref 4.0–10.5)
nRBC: 0 % (ref 0.0–0.2)

## 2019-12-06 LAB — PHOSPHORUS: Phosphorus: 4.7 mg/dL — ABNORMAL HIGH (ref 2.5–4.6)

## 2019-12-06 LAB — MAGNESIUM: Magnesium: 1.8 mg/dL (ref 1.7–2.4)

## 2019-12-06 MED ORDER — MORPHINE SULFATE (PF) 2 MG/ML IV SOLN
2.0000 mg | Freq: Once | INTRAVENOUS | Status: AC
  Administered 2019-12-06: 2 mg via INTRAVENOUS

## 2019-12-06 MED ORDER — SODIUM CHLORIDE 0.9 % IV SOLN
25.0000 ug/min | INTRAVENOUS | Status: DC
  Administered 2019-12-06 – 2019-12-07 (×4): 50 ug/min via INTRAVENOUS
  Administered 2019-12-07 (×2): 100 ug/min via INTRAVENOUS
  Administered 2019-12-07 (×5): 50 ug/min via INTRAVENOUS
  Administered 2019-12-08: 60 ug/min via INTRAVENOUS
  Administered 2019-12-08 (×2): 50 ug/min via INTRAVENOUS
  Administered 2019-12-08 (×3): 60 ug/min via INTRAVENOUS
  Administered 2019-12-08: 50 ug/min via INTRAVENOUS
  Administered 2019-12-09: 75 ug/min via INTRAVENOUS
  Administered 2019-12-09: 60 ug/min via INTRAVENOUS
  Administered 2019-12-09: 75 ug/min via INTRAVENOUS
  Administered 2019-12-09: 60 ug/min via INTRAVENOUS
  Filled 2019-12-06: qty 1
  Filled 2019-12-06: qty 10
  Filled 2019-12-06: qty 1
  Filled 2019-12-06: qty 10
  Filled 2019-12-06 (×4): qty 1
  Filled 2019-12-06 (×6): qty 10
  Filled 2019-12-06 (×4): qty 1
  Filled 2019-12-06 (×3): qty 10

## 2019-12-06 NOTE — Progress Notes (Addendum)
Daily Progress Note   Patient Name: Larry Andrade.       Date: 12/06/2019 DOB: 24-Feb-1961  Age: 59 y.o. MRN#: [id number] Attending Physician: Flora Lipps, MD Primary Care Physician: Rutherford Limerick, PA Admit Date: 12/08/2019  Reason for Consultation/Follow-up: Establishing goals of care  Subjective: Patient is resting in bed. He answers all orientation questions appropriately. His significant other is at bedside along with an additional family member. Chaplain is at bedside. He states he is ready to go to heaven. Upon further discussion, he discusses his pain and suffering, also discussing that he does not wish to stop his pressor medication and states he wants to try to live a little longer vacillating between a goal of hours and days. Girlfriend is concerned he is afraid to die. She states he makes request for things in preparation for transition to comfort care, and then once those requests are met, he makes additional requests. She states for example he requested to speak to a friend and now is stating he wants to speak with him again before he shifts to comfort care.  Chaplain speaking with patient.    Length of Stay: 4  Current Medications: Scheduled Meds:  . sodium chloride   Intravenous Once  . allopurinol  100 mg Oral BID  . folic acid  1 mg Oral Daily  . lactulose  30 g Oral BID  . levalbuterol  0.63 mg Nebulization Q6H  . mouth rinse  15 mL Mouth Rinse BID  . metoprolol succinate  25 mg Oral BID  . midodrine  10 mg Oral TID WC  . nicotine  21 mg Transdermal Daily  . pantoprazole  40 mg Intravenous Q12H  . thiamine  100 mg Oral Daily    Continuous Infusions: . sodium chloride Stopped (12/03/19 0611)  . sodium chloride    . sodium chloride    . amiodarone 60 mg/hr  (12/06/19 0926)  . ceFTAROline (TEFLARO) IV Stopped (12/06/19 0355)  . octreotide  (SANDOSTATIN)    IV infusion 50 mcg/hr (12/06/19 0800)  . phenylephrine (NEO-SYNEPHRINE) Adult infusion 75 mcg/min (12/06/19 1123)    PRN Meds: sodium chloride, docusate sodium, guaiFENesin, levalbuterol, morphine injection, ondansetron (ZOFRAN) IV, oxyCODONE, polyethylene glycol  Physical Exam Pulmonary:     Effort: Pulmonary effort is normal.  Neurological:  Mental Status: He is alert.             Vital Signs: BP (!) 84/71   Pulse 95   Temp 98.2 F (36.8 C) (Oral)   Resp 17   Ht '6\' 2"'$  (1.88 m)   Wt 112.1 kg   SpO2 (!) 88%   BMI 31.73 kg/m  SpO2: SpO2: (!) 88 % O2 Device: O2 Device: Nasal Cannula O2 Flow Rate: O2 Flow Rate (L/min): 3 L/min  Intake/output summary:   Intake/Output Summary (Last 24 hours) at 12/06/2019 1310 Last data filed at 12/06/2019 0800 Gross per 24 hour  Intake 4296.19 ml  Output 75 ml  Net 4221.19 ml   LBM: Last BM Date: 12/05/19 Baseline Weight: Weight: 90.7 kg Most recent weight: Weight: 112.1 kg       Palliative Assessment/Data:      Patient Active Problem List   Diagnosis Date Noted  . Hepatic encephalopathy syndrome ([place]) 12/03/2019  . Alcoholic hepatitis without ascites 11/22/2019  . Tobacco abuse 11/18/2019  . Hematemesis 11/18/2019  . Alcohol abuse   . Stage 3a chronic kidney disease   . Thrombocytopenia ([place])   . Essential hypertension   . AF (paroxysmal atrial fibrillation) ([place])   . Chronic diastolic CHF (congestive heart failure) ([place])   . Alcoholic cirrhosis ([place])   . Gastrointestinal hemorrhage 10/06/2019  . COPD exacerbation ([place]) 10/06/2019  . Symptomatic anemia 04/06/2019  . Malnutrition of moderate degree 09/04/2018  . Sepsis ([place]) 08/26/2018  . Acute on chronic systolic CHF (congestive heart failure) ([place]) 08/17/2018  . Lactic acid acidosis 10/25/2017  . Generalized abdominal pain   . Hematemesis with nausea   . Acute  blood loss anemia   . Gout attack 02/22/2017    Palliative Care Assessment & Plan    Recommendations/Plan:  Patient is requesting continued care and pressor support.    Code Status:    Code Status Orders  (From admission, onward)         Start     Ordered   12/03/19 1528  Full code  Continuous     12/03/19 1527        Code Status History    Date Active Date Inactive Code Status Order ID Comments User Context   12/03/2019 0851 12/03/2019 1527 DNR [id number]  Flora Lipps, MD Inpatient   12/23/2019 1626 12/03/2019 0851 Full Code [id number]  Ottie Glazier, MD ED   11/18/2019 1920 11/22/2019 2017 Full Code [id number]  Ivor Costa, MD Inpatient   10/06/2019 2015 10/11/2019 0042 Full Code [id number]  Mansy, Arvella Merles, MD ED   04/06/2019 1652 04/09/2019 1603 Full Code [id number]  Dustin Flock, MD ED   08/26/2018 1724 09/08/2018 2203 Full Code [id number]  Bettey Costa, MD ED   08/17/2018 0430 08/21/2018 1807 Full Code [id number]  Harrie Foreman, MD ED   10/25/2017 0147 10/26/2017 1717 Full Code [id number]  Amelia Jo, MD Inpatient   02/22/2017 1122 02/24/2017 1413 Full Code [id number]  Loletha Grayer, MD ED   Advance Care Planning Activity       Prognosis:   < 2 weeks      Thank you for allowing the Palliative Medicine Team to assist in the care of this patient.   Total Time 35 min Prolonged Time Billed no      Greater than 50%  of this time was spent counseling and coordinating care related to the above assessment and plan.  Asencion Gowda, NP  Please contact Palliative Medicine Team phone at [phone]  for questions and concerns.

## 2019-12-06 NOTE — Progress Notes (Signed)
PHARMACY CONSULT NOTE - FOLLOW UP  Pharmacy Consult for Electrolyte Monitoring and Replacement   Recent Labs: Potassium (mmol/L)  Date Value  12/06/2019 3.9   Magnesium (mg/dL)  Date Value  36/11/7701 1.8   Calcium (mg/dL)  Date Value  40/35/2481 7.4 (L)   Calcium, Total (PTH) (mg/dL)  Date Value  85/90/9311 9.4   Albumin (g/dL)  Date Value  21/62/4469 2.3 (L)   Phosphorus (mg/dL)  Date Value  50/72/2575 4.7 (H)   Sodium (mmol/L)  Date Value  12/06/2019 132 (L)     Assessment: Patient is a 59 y/o M with history of end stage liver disease, alcohol abuse, esophageal varices, atrial fibrillation, COPD who is admitted to the ICU with possible GIB, septic shock secondary to bacteremia, acute renal failure. Pharmacy has been consulted to assist with electrolyte repletion.  Renal function continues to worsen. He is having multiple bowel movements per day on lactulose 30g BID. 7x bowel movements documented in chart yesterday. WBC trending up ; patient is on antibiotics for bacteremia.   Goal of Therapy:  K 4 Mg 2 All other electrolytes WNL   Plan:  No replacement indicated today. Will defer ordering electrolytes with morning labs.  Tressie Ellis  Pharmacy Resident 12/06/2019 10:42 AM

## 2019-12-06 NOTE — Progress Notes (Signed)
[facility] Liaison note:  Follow up visit made to new refefral for [facility] hospice home. Larry Andrade was seen lying ion bed, awake. No family at bedside. He continues on several IV medications/continuous drips. He did voice understanding that these medications would be discontinued prior to transport to the hospice home.  He stated" yes, I cannot go home, I will live out my last days over there".  Larry Andrade and hospital care team made aware that [facility] has no bed availability today.  Will continue to follow and update all regarding bed availability.  Dayna Barker BSN, RN, [facility] [employer] [phone]

## 2019-12-06 NOTE — Progress Notes (Signed)
CRITICAL CARE NOTE  59 y.o.malewith medical history significant of alcohol abuse, alcoholic cirrhosis obesity esophageal varices, hypertension, COPD, GERD, gout, pancreatitis, atrial fibrillation on anticoagulants, CKD stage III, thrombocytopenia, tobacco abuse, who presents with hematemesis. Patient has been having recurrenthematemesisbut was not vomiting blood around mouth and chin. Patient has abdominal distention and skin jaundice.active smoking appx 1 pack daily. He is jaundiced and in anasarca. Critical care admission is requested due to encephalopathy, anasarca and possible GIB.    Lines / Drains: PIVx2  Cultures / Sepsis markers: Blood and urine  Antibiotics: Rocephin   Protocols / Consultants: GI, PCCM  6/6 admitted for active GIB from liver cirrhosis with encephalopathy, septic shock 6/7 MRSA bacteremia and STEP PNEUMONIA bacteramia 6/8 active bleeding, worsening jaundice and renal failure 6/9 active bleeding, shock, on pressors 6/10 active bleeding, shock, on pressors   CC  follow up shock  HPI Patient remains critically ill Prognosis is guarded Alert and awake On pressors   BP 90/63   Pulse (!) 107   Temp 98 F (36.7 C) (Oral)   Resp 19   Ht 6\' 2"  (1.88 m)   Wt 112.1 kg   SpO2 (!) 89%   BMI 31.73 kg/m    I/O last 3 completed shifts: In: 7094.3 [P.O.:480; I.V.:5864.3; IV Piggyback:750] Out: 600 [Urine:600] No intake/output data recorded.  SpO2: (!) 89 % O2 Flow Rate (L/min): 3 L/min  Estimated body mass index is 31.73 kg/m as calculated from the following:   Height as of this encounter: 6\' 2"  (1.88 m).   Weight as of this encounter: 112.1 kg.      Review of Systems:  Gen:  +fatigue HEENT: Denies blurred vision, double vision, ear pain, eye pain, hearing loss, nose bleeds, sore throat Cardiac:  No dizziness, chest pain or heaviness, chest tightness,edema, No JVD Resp:   No cough, -sputum production, +shortness of  breath,-wheezing, -hemoptysis,  Other:  All other systems negative    PHYSICAL EXAMINATION:  GENERAL:critically ill appearing, +jaundice HEAD: Normocephalic, atraumatic.  EYES: +scleral icterus.  MOUTH: Moist mucosal membrane. NECK: Supple.  PULMONARY: +rhonchi, +wheezing CARDIOVASCULAR: S1 and S2. Regular rate and rhythm. No murmurs, rubs, or gallops.  GASTROINTESTINAL: Soft, nontender, +distended.  Positive bowel sounds.   MUSCULOSKELETAL: No swelling, clubbing, or edema.  NEUROLOGIC: alert and awake SKIN:intact,warm,dry  MEDICATIONS: I have reviewed all medications and confirmed regimen as documented   CULTURE RESULTS   Recent Results (from the past 240 hour(s))  Culture, blood (routine x 2)     Status: Abnormal   Collection Time: 11/29/2019  2:22 PM   Specimen: BLOOD  Result Value Ref Range Status   Specimen Description   Final    BLOOD LEFT ANTECUBITAL Performed at [facility], [address], [place], [place] [address]    Special Requests   Final    BOTTLES DRAWN AEROBIC AND ANAEROBIC Blood Culture adequate volume Performed at [facility], [address] [place], [place] [address]    Culture  Setup Time   Final    Organism ID to follow [place] AND ANAEROBIC BOTTLES CRITICAL RESULT CALLED TO, READ BACK BY AND VERIFIED WITH: DAVID BESANTI 12/03/19 AT 0105 HS Performed at [facility], [address] [place], [place] [address]    Culture (A)  Final    METHICILLIN RESISTANT STAPHYLOCOCCUS AUREUS STREPTOCOCCUS PNEUMONIAE nonviable    Report Status 12/05/2019 FINAL  Final   Organism ID, Bacteria METHICILLIN RESISTANT STAPHYLOCOCCUS AUREUS  Final  Susceptibility   Methicillin resistant staphylococcus aureus - MIC*    CIPROFLOXACIN 1 SENSITIVE Sensitive     ERYTHROMYCIN RESISTANT Resistant     GENTAMICIN <=0.5 SENSITIVE Sensitive     OXACILLIN >=4 RESISTANT Resistant     TETRACYCLINE <=1 SENSITIVE  Sensitive     VANCOMYCIN 1 SENSITIVE Sensitive     TRIMETH/SULFA <=10 SENSITIVE Sensitive     CLINDAMYCIN RESISTANT Resistant     RIFAMPIN <=0.5 SENSITIVE Sensitive     Inducible Clindamycin POSITIVE Resistant     * METHICILLIN RESISTANT STAPHYLOCOCCUS AUREUS  Blood Culture ID Panel (Reflexed)     Status: Abnormal   Collection Time: 12/01/2019  2:22 PM  Result Value Ref Range Status   Enterococcus species NOT DETECTED NOT DETECTED Final   Listeria monocytogenes NOT DETECTED NOT DETECTED Final   Staphylococcus species DETECTED (A) NOT DETECTED Final    Comment: CRITICAL RESULT CALLED TO, READ BACK BY AND VERIFIED WITH: DAVID BESANTI 12/03/19 AT 0105 HS    Staphylococcus aureus (BCID) DETECTED (A) NOT DETECTED Final    Comment: Methicillin (oxacillin)-resistant Staphylococcus aureus (MRSA). MRSA is predictably resistant to beta-lactam antibiotics (except ceftaroline). Preferred therapy is vancomycin unless clinically contraindicated. Patient requires contact precautions if  hospitalized. CRITICAL RESULT CALLED TO, READ BACK BY AND VERIFIED WITH: DAVID BESANTI 12/03/19 AT 0105 HS    Methicillin resistance DETECTED (A) NOT DETECTED Final    Comment: CRITICAL RESULT CALLED TO, READ BACK BY AND VERIFIED WITH: DAVID BESANTI 12/03/19 AT 0105 HS    Streptococcus species DETECTED (A) NOT DETECTED Final    Comment: CRITICAL RESULT CALLED TO, READ BACK BY AND VERIFIED WITH: DAVID BESANTI 12/03/19 AT 0105 HS    Streptococcus agalactiae NOT DETECTED NOT DETECTED Final   Streptococcus pneumoniae DETECTED (A) NOT DETECTED Final    Comment: CRITICAL RESULT CALLED TO, READ BACK BY AND VERIFIED WITH: DAVID BESANTI 12/03/19 AT 0105 HS    Streptococcus pyogenes NOT DETECTED NOT DETECTED Final   Acinetobacter baumannii NOT DETECTED NOT DETECTED Final   Enterobacteriaceae species NOT DETECTED NOT DETECTED Final   Enterobacter cloacae complex NOT DETECTED NOT DETECTED Final   Escherichia coli NOT DETECTED NOT  DETECTED Final   Klebsiella oxytoca NOT DETECTED NOT DETECTED Final   Klebsiella pneumoniae NOT DETECTED NOT DETECTED Final   Proteus species NOT DETECTED NOT DETECTED Final   Serratia marcescens NOT DETECTED NOT DETECTED Final   Haemophilus influenzae NOT DETECTED NOT DETECTED Final   Neisseria meningitidis NOT DETECTED NOT DETECTED Final   Pseudomonas aeruginosa NOT DETECTED NOT DETECTED Final   Candida albicans NOT DETECTED NOT DETECTED Final   Candida glabrata NOT DETECTED NOT DETECTED Final   Candida krusei NOT DETECTED NOT DETECTED Final   Candida parapsilosis NOT DETECTED NOT DETECTED Final   Candida tropicalis NOT DETECTED NOT DETECTED Final    Comment: Performed at [facility], [address] [place], [place] [address]  Culture, blood (routine x 2)     Status: Abnormal   Collection Time: 12/01/2019  3:46 PM   Specimen: BLOOD  Result Value Ref Range Status   Specimen Description   Final    BLOOD LEFT ANTECUBITAL Performed at [facility], [address] [place], [place] [address]    Special Requests   Final    BOTTLES DRAWN AEROBIC AND ANAEROBIC Blood Culture results may not be optimal due to an excessive volume of blood received in culture bottles Performed at [facility], [address], [place], [place] [address]  Culture  Setup Time   Final    GRAM POSITIVE COCCI IN BOTH AEROBIC AND ANAEROBIC BOTTLES CRITICAL VALUE NOTED.  VALUE IS CONSISTENT WITH PREVIOUSLY REPORTED AND CALLED VALUE. Performed at [facility], [address] [place], [place] [address]    Culture STAPHYLOCOCCUS AUREUS (A)  Final   Report Status 12/05/2019 FINAL  Final  SARS Coronavirus 2 by RT PCR (hospital order, performed in [facility] hospital lab) Nasopharyngeal Nasopharyngeal Swab     Status: None   Collection Time: 29-Dec-2019  3:46 PM   Specimen: Nasopharyngeal Swab  Result Value Ref Range Status   SARS Coronavirus 2 NEGATIVE NEGATIVE Final     Comment: (NOTE) SARS-CoV-2 target nucleic acids are NOT DETECTED. The SARS-CoV-2 RNA is generally detectable in upper and lower respiratory specimens during the acute phase of infection. The lowest concentration of SARS-CoV-2 viral copies this assay can detect is 250 copies / mL. A negative result does not preclude SARS-CoV-2 infection and should not be used as the sole basis for treatment or other patient management decisions.  A negative result may occur with improper specimen collection / handling, submission of specimen other than nasopharyngeal swab, presence of viral mutation(s) within the areas targeted by this assay, and inadequate number of viral copies (<250 copies / mL). A negative result must be combined with clinical observations, patient history, and epidemiological information. Fact Sheet for Patients:   BoilerBrush.com.cy Fact Sheet for Healthcare Providers: https://pope.com/ This test is not yet approved or cleared  by the [place] FDA and has been authorized for detection and/or diagnosis of SARS-CoV-2 by FDA under an Emergency Use Authorization (EUA).  This EUA will remain in effect (meaning this test can be used) for the duration of the COVID-19 declaration under Section 564(b)(1) of the Act, 21 U.S.C. section 360bbb-3(b)(1), unless the authorization is terminated or revoked sooner. Performed at [facility], [address] [place], [place] [address]   Urine culture     Status: None   Collection Time: 12/03/19  8:19 PM   Specimen: Urine, Random  Result Value Ref Range Status   Specimen Description   Final    URINE, RANDOM Performed at [facility], [address], [place], [place] [address]    Special Requests   Final    NONE Performed at [facility], [address], [place], [place] [address]    Culture   Final    NO GROWTH Performed at [facility], [address] [place], [place] [address]    Report Status 12/04/2019 FINAL  Final              ASSESSMENT AND PLAN SYNOPSIS  59 yo admitted for active GIB from liver cirrhosis with encephalopathy, septic shock withprogressivemultiorgan failure and septic shock +MRSA AND STREP PNEUMONIABacteremia patient is in dying process  ACUTE KIDNEY INJURY/Renal Failure -continue Foley Catheter-assess need -Avoid nephrotoxic agents -Follow urine output, BMP -Ensure adequate renal perfusion, optimize oxygenation -Renal dose medications   Morbid obesity, possible OSA.   Will certainly impact respiratory mechanics,   SHOCK-SEPSIS/HYPOVOLUMIC -use vasopressors to keep MAP>65 CAP NEO AT 75 mcg  CARDIAC ICU monitoring  ID -continue IV abx as prescibed -follow up cultures  GI GI PROPHYLAXIS as indicated  NUTRITIONAL STATUS Nutrition Status:         DIET--> as tolerated Constipation protocol as indicated  ENDO - will use ICU hypoglycemic\Hyperglycemia protocol if indicated     ELECTROLYTES -follow labs as needed -replace  as needed -pharmacy consultation and following   DVT/GI PRX ordered and assessed TRANSFUSIONS AS NEEDED MONITOR FSBS I Assessed the need for Labs I Assessed the need for Foley I Assessed the need for Central Venous Line Family Discussion when available I Assessed the need for Mobilization I made an Assessment of medications to be adjusted accordingly Safety Risk assessment completed   CASE DISCUSSED IN MULTIDISCIPLINARY ROUNDS WITH ICU TEAM  Critical Care Time devoted to patient care services described in this note is 32 minutes.   Overall, patient is critically ill, prognosis is guarded.  Patient with Multiorgan failure and at high risk for cardiac arrest and death.   AT THIS TIME, PATIENT IS DNR/DNI, PATIENT NOT A CANDIDATE FOR HD AND PATIENT REFUSED, PATIENT ACTIVELY DYING PROCESS AND WILL PROCEED WITH COMFORT MEASURES WHEN THE TIME COMES, ALL  VISITORS ARE COMING TO VISIT PATIENT   Jackqueline Aquilar Santiago Glad, M.D.  [facility]  Medical Director [facility] Medical Director [facility] Cardio-Pulmonary Department

## 2019-12-06 NOTE — Progress Notes (Signed)
ID Pt doing poorly Patient Vitals for the past 24 hrs:  BP Temp Temp src Pulse Resp SpO2 Weight  12/06/19 1800 90/63 -- -- (!) 107 19 (!) 89 % --  12/06/19 1700 93/61 -- -- 87 14 (!) 89 % --  12/06/19 1600 (!) 94/52 98 F (36.7 C) Oral (!) 101 17 96 % --  12/06/19 1509 95/84 -- -- 96 19 94 % --  12/06/19 1400 (!) 83/55 -- -- 96 14 (!) 89 % --  12/06/19 1300 (!) 83/56 -- -- (!) 101 (!) 23 90 % --  12/06/19 1200 (!) 84/71 -- -- 95 17 (!) 88 % --  12/06/19 1100 (!) 90/59 -- -- (!) 106 16 93 % --  12/06/19 1000 94/69 -- -- (!) 102 19 (!) 88 % --  12/06/19 0900 (!) 85/59 -- -- (!) 107 17 94 % --  12/06/19 0800 (!) 89/46 98.2 F (36.8 C) Oral 99 18 90 % --  12/06/19 0700 93/70 -- -- (!) 105 20 92 % --  12/06/19 0615 93/60 -- -- (!) 101 17 (!) 86 % --  12/06/19 0600 98/84 -- -- 94 20 (!) 85 % --  12/06/19 0545 (!) 94/57 -- -- (!) 112 18 96 % --  12/06/19 0530 (!) 82/55 -- -- (!) 104 14 91 % --  12/06/19 0515 105/86 -- -- 96 (!) 21 100 % --  12/06/19 0500 101/62 -- -- (!) 107 15 93 % 112.1 kg  12/06/19 0445 -- -- -- 100 17 93 % --  12/06/19 0430 (!) 83/69 -- -- 97 16 93 % --  12/06/19 0415 92/62 -- -- (!) 101 14 (!) 85 % --  12/06/19 0400 99/81 -- -- (!) 110 18 94 % --  12/06/19 0345 91/63 -- -- 99 15 (!) 88 % --  12/06/19 0330 (!) 91/59 -- -- (!) 102 17 (!) 87 % --  12/06/19 0315 (!) 88/54 -- -- 91 13 (!) 88 % --  12/06/19 0300 103/66 -- -- (!) 102 17 92 % --  12/06/19 0245 96/65 -- -- (!) 104 16 (!) 89 % --  12/06/19 0230 102/72 -- -- (!) 106 18 91 % --  12/06/19 0215 -- -- -- 96 15 91 % --  12/06/19 0200 95/66 -- -- (!) 105 15 92 % --  12/06/19 0145 (!) 83/58 -- -- (!) 108 20 92 % --  12/06/19 0130 (!) 73/45 -- -- 99 16 91 % --  12/06/19 0115 (!) 86/65 -- -- (!) 107 (!) 21 95 % --  12/06/19 0100 105/81 -- -- (!) 117 18 (!) 88 % --  12/06/19 0045 102/77 -- -- (!) 105 15 94 % --  12/06/19 0017 -- 98.1 F (36.7 C) Oral -- -- -- --  12/06/19 0015 (!) 88/72 -- -- 100 15 90 % --   12/06/19 0013 (!) 94/57 -- -- (!) 107 18 (!) 86 % --  12/06/19 0000 (!) 49/30 -- -- (!) 105 16 (!) 87 % --  12/05/19 2345 (!) 69/51 -- -- (!) 101 10 90 % --  12/05/19 2330 (!) 82/58 -- -- 98 13 91 % --  12/05/19 2315 (!) 88/55 -- -- (!) 105 17 93 % --  12/05/19 2300 (!) 94/56 -- -- 92 11 90 % --  12/05/19 2245 (!) 89/61 -- -- 94 14 (!) 86 % --  12/05/19 2230 (!) 79/69 -- -- (!) 118 16 (!) 88 % --  12/05/19 2215 Marland Kitchen)  88/53 -- -- 99 15 (!) 87 % --  12/05/19 2200 (!) 96/58 -- -- (!) 105 14 (!) 87 % --  12/05/19 2145 (!) 81/50 -- -- (!) 107 16 (!) 89 % --       CBC Latest Ref Rng & Units 12/06/2019 12/05/2019 12/05/2019  WBC 4.0 - 10.5 K/uL 30.1(H) - 23.6(H)  Hemoglobin 13.0 - 17.0 g/dL 7.8(L) 7.8(L) 7.8(L)  Hematocrit 39 - 52 % 22.0(L) 22.5(L) 23.4(L)  Platelets 150 - 400 K/uL 88(L) - 76(L)    CMP Latest Ref Rng & Units 12/06/2019 12/05/2019 12/04/2019  Glucose 70 - 99 mg/dL 96 213(Y) 865(H)  BUN 6 - 20 mg/dL 84(O) 96(E) 95(M)  Creatinine 0.61 - 1.24 mg/dL 8.41(L) 2.44(W) 1.02(V)  Sodium 135 - 145 mmol/L 132(L) 132(L) 132(L)  Potassium 3.5 - 5.1 mmol/L 3.9 3.8 4.0  Chloride 98 - 111 mmol/L 98 96(L) 96(L)  CO2 22 - 32 mmol/L 18(L) 21(L) 22  Calcium 8.9 - 10.3 mg/dL 7.4(L) 7.2(L) 7.0(L)  Total Protein 6.5 - 8.1 g/dL 2.5(D) 5.9(L) 5.6(L)  Total Bilirubin 0.3 - 1.2 mg/dL 6.6(Y) 6.1(H) 6.0(H)  Alkaline Phos 38 - 126 U/L 191(H) 121 96  AST 15 - 41 U/L 72(H) 57(H) 43(H)  ALT 0 - 44 U/L 38 34 32     Decompensated liver cirrhosis with anasarca  -MRSA bacteremia and strep pneumo bacteremia with shock- on pressure support  On Vanco and ceftriaxone- because of CKDchanged to ceftaroline Repeat blood culture  2 d echo done today limited study but no vegetation   AKI on CKD. Hyperbilirubinemia/Transaminitis  Atrial fibrillation on amiodarone  Congestive heart failure ? Pneumonia MRSA nares positive  Recurrent GI bleed due to portal hypertension-on octreotide  Anemia   History  of septic arthritis left knee with MRSA status post synovectomy in March 2021 and treated with 6 weeks of IV antibiotics  Very poor prognosis

## 2019-12-06 NOTE — Progress Notes (Signed)
CH visited pt. as follow-up from visit earlier this week and in response to Rn sharing that pt. has voiced readiness to die yet plan of care has not shifted to comfort care. Pt. lying in bed w/significant other and stepson @ bedside.  Pt. immediately asked for prayer; 'I'm ready to go... For the Lord to take me.' he said.  CH began exploring what pt. meant by this and what the last few days have been like for him; pt. and family shared many close friends and family visited yesterday and pt. had chance to say goodbyes. Palliative Care NP joined visit; explored what pt. means by being 'done'; pt. began to voice reticence to die and feelings of frustration at being 'pushed' to make a decision.  'I'm waiting for the Lord to take me and whenever that happens is fine with me, but I feel like all these people are eager for me to die,' pt. shared.  CH suggested perhaps pt.'s opting to continue heavy artificial life support may be working against desire to allow God to take him.  Pt. voiced desire to stay alive 'for a few more hours'; pt. not willing at this time to continue to discuss changing to comfort-only care. CH prayed w/pt. for sense of divine presence and peace in facing imminent death. CH had extended conversation w/family and palliative care NP outside rm.  Family is ready for pt.'s suffering to be over, and seeing him prolong this has been painful for them, S.O. shared.  PC NP and CH suggested pt.'s knowing this may help him make difficult decision to shift to comfort care, following his repeated wishes to be 'done'.  Family requested additional prayer w/pt before CH left.  At this time, CH assesses pt. is too agitated for further discussion to be productive, but [facility] plans to follow up later this afternoon if pt. is available.

## 2019-12-06 NOTE — TOC Progression Note (Addendum)
Transition of Care (TOC) - Progression Note    Patient Details  Name: Larry Andrade. MRN: [id number] Date of Birth: 18-Nov-1960  Transition of Care [facility]) CM/SW Contact  Liliana Cline, LCSW Phone Number: 12/06/2019, 10:34 AM  Clinical Narrative:   CSW received update from [facility] that patient and family decided for hospice home instead of home with hospice. Patient on wait list for [facility] in [place] per their preference.   CSW reached out to [employer]. Per Clydie Braun, no beds are available today and none are expected to become available today, but she will update CSW if this changes. Plan for patient to go to hospice home when a bed is available IF he is medically stable to transport there at that time.    Expected Discharge Plan: Home w Hospice Care    Expected Discharge Plan and Services Expected Discharge Plan: Home w Hospice Care     Post Acute Care Choice: Hospice Living arrangements for the past 2 months: Single Family Home                                       Social Determinants of Health (SDOH) Interventions    Readmission Risk Interventions Readmission Risk Prevention Plan 11/19/2019 10/08/2019 04/09/2019  Transportation Screening Complete Complete Complete  PCP or Specialist Appt within 3-5 Days Complete Complete Complete  HRI or Home Care Consult Complete Complete Complete  Social Work Consult for Recovery Care Planning/Counseling - Patient refused Patient refused  Palliative Care Screening Complete Not Applicable Not Applicable  Medication Review Oceanographer) Complete Complete Referral to Pharmacy  Some recent data might be hidden

## 2019-12-07 DIAGNOSIS — N171 Acute kidney failure with acute cortical necrosis: Secondary | ICD-10-CM

## 2019-12-07 DIAGNOSIS — N179 Acute kidney failure, unspecified: Secondary | ICD-10-CM

## 2019-12-07 DIAGNOSIS — Z66 Do not resuscitate: Secondary | ICD-10-CM

## 2019-12-07 DIAGNOSIS — Z7189 Other specified counseling: Secondary | ICD-10-CM

## 2019-12-07 DIAGNOSIS — Z515 Encounter for palliative care: Secondary | ICD-10-CM

## 2019-12-07 LAB — COMPREHENSIVE METABOLIC PANEL
ALT: 36 U/L (ref 0–44)
AST: 56 U/L — ABNORMAL HIGH (ref 15–41)
Albumin: 2.2 g/dL — ABNORMAL LOW (ref 3.5–5.0)
Alkaline Phosphatase: 213 U/L — ABNORMAL HIGH (ref 38–126)
Anion gap: 16 — ABNORMAL HIGH (ref 5–15)
BUN: 76 mg/dL — ABNORMAL HIGH (ref 6–20)
CO2: 18 mmol/L — ABNORMAL LOW (ref 22–32)
Calcium: 7.5 mg/dL — ABNORMAL LOW (ref 8.9–10.3)
Chloride: 97 mmol/L — ABNORMAL LOW (ref 98–111)
Creatinine, Ser: 3.71 mg/dL — ABNORMAL HIGH (ref 0.61–1.24)
GFR calc Af Amer: 20 mL/min — ABNORMAL LOW (ref >60)
GFR calc non Af Amer: 17 mL/min — ABNORMAL LOW (ref >60)
Glucose, Bld: 83 mg/dL (ref 70–99)
Potassium: 4.1 mmol/L (ref 3.5–5.1)
Sodium: 131 mmol/L — ABNORMAL LOW (ref 135–145)
Total Bilirubin: 5.5 mg/dL — ABNORMAL HIGH (ref 0.3–1.2)
Total Protein: 5.8 g/dL — ABNORMAL LOW (ref 6.5–8.1)

## 2019-12-07 LAB — CBC
HCT: 22 % — ABNORMAL LOW (ref 39.0–52.0)
Hemoglobin: 7.7 g/dL — ABNORMAL LOW (ref 13.0–17.0)
MCH: 35 pg — ABNORMAL HIGH (ref 26.0–34.0)
MCHC: 35 g/dL (ref 30.0–36.0)
MCV: 100 fL (ref 80.0–100.0)
Platelets: 96 10*3/uL — ABNORMAL LOW (ref 150–400)
RBC: 2.2 MIL/uL — ABNORMAL LOW (ref 4.22–5.81)
RDW: 22.6 % — ABNORMAL HIGH (ref 11.5–15.5)
WBC: 27.7 10*3/uL — ABNORMAL HIGH (ref 4.0–10.5)
nRBC: 0.1 % (ref 0.0–0.2)

## 2019-12-07 LAB — PHOSPHORUS: Phosphorus: 5.3 mg/dL — ABNORMAL HIGH (ref 2.5–4.6)

## 2019-12-07 LAB — MAGNESIUM: Magnesium: 1.7 mg/dL (ref 1.7–2.4)

## 2019-12-07 MED ORDER — PROMETHAZINE HCL 25 MG/ML IJ SOLN
12.5000 mg | Freq: Once | INTRAMUSCULAR | Status: AC
  Administered 2019-12-07: 12.5 mg via INTRAVENOUS
  Filled 2019-12-07: qty 1

## 2019-12-07 MED ORDER — PROMETHAZINE HCL 25 MG/ML IJ SOLN
12.5000 mg | Freq: Four times a day (QID) | INTRAMUSCULAR | Status: DC | PRN
  Administered 2019-12-07: 12.5 mg via INTRAVENOUS
  Filled 2019-12-07: qty 1

## 2019-12-07 NOTE — Progress Notes (Signed)
CRITICAL CARE NOTE  59 y.o.malewith medical history significant of alcohol abuse, alcoholic cirrhosis obesity esophageal varices, hypertension, COPD, GERD, gout, pancreatitis, atrial fibrillation on anticoagulants, CKD stage III, thrombocytopenia, tobacco abuse, who presents with hematemesis. Patient has been having recurrenthematemesisbut was not vomiting blood around mouth and chin. Patient has abdominal distention and skin jaundice.active smoking appx 1 pack daily. He is jaundiced and in anasarca. Critical care admission is requested due to encephalopathy, anasarca and possible GIB.    Lines / Drains: PIVx2  Cultures / Sepsis markers: Blood and urine  Antibiotics: Rocephin   Protocols / Consultants: GI, PCCM  6/6 admitted for active GIB from liver cirrhosis with encephalopathy, septic shock 6/7 MRSA bacteremia and STEP PNEUMONIA bacteramia 6/8 active bleeding, worsening jaundice and renal failure 6/9 active bleeding, shock, on pressors 6/10 active bleeding, shock, on pressors   CC  follow up shock  HPI Patient remains critically ill Prognosis is guarded Alert and awake On pressors   BP (!) 83/56   Pulse 85   Temp 97.6 F (36.4 C) (Axillary)   Resp 15   Ht 6\' 2"  (1.88 m)   Wt 114 kg   SpO2 92%   BMI 32.27 kg/m    I/O last 3 completed shifts: In: 5970.9 [P.O.:480; I.V.:4490.9; IV Piggyback:1000] Out: 425 [Urine:425] No intake/output data recorded.  SpO2: 92 % O2 Flow Rate (L/min): 4 L/min  Estimated body mass index is 32.27 kg/m as calculated from the following:   Height as of this encounter: 6\' 2"  (1.88 m).   Weight as of this encounter: 114 kg.     PHYSICAL EXAMINATION:  GENERAL:critically ill appearing, +jaundice HEAD: Normocephalic, atraumatic.  EYES: +scleral icterus.  MOUTH: Moist mucosal membrane. NECK: Supple.  PULMONARY: +rhonchi, +wheezing CARDIOVASCULAR: S1 and S2. IRRR, rate controlled  No murmurs, rubs, or gallops.   GASTROINTESTINAL: Soft, nontender, +distended.  Positive bowel sounds.   MUSCULOSKELETAL: 2+ edema.  NEUROLOGIC: alert and awake SKIN:intact,warm,dry  MEDICATIONS: I have reviewed all medications and confirmed regimen as documented   CULTURE RESULTS   Recent Results (from the past 240 hour(s))  Culture, blood (routine x 2)     Status: Abnormal   Collection Time: 11/27/2019  2:22 PM   Specimen: BLOOD  Result Value Ref Range Status   Specimen Description   Final    BLOOD LEFT ANTECUBITAL Performed at [facility], [address], [place], [place] [address]    Special Requests   Final    BOTTLES DRAWN AEROBIC AND ANAEROBIC Blood Culture adequate volume Performed at [facility], [address] [place], [place] [address]    Culture  Setup Time   Final    Organism ID to follow [place] AND ANAEROBIC BOTTLES CRITICAL RESULT CALLED TO, READ BACK BY AND VERIFIED WITH: DAVID BESANTI 12/03/19 AT 0105 HS Performed at [facility], [address] [place], [place] [address]    Culture (A)  Final    METHICILLIN RESISTANT STAPHYLOCOCCUS AUREUS STREPTOCOCCUS PNEUMONIAE nonviable    Report Status 12/05/2019 FINAL  Final   Organism ID, Bacteria METHICILLIN RESISTANT STAPHYLOCOCCUS AUREUS  Final      Susceptibility   Methicillin resistant staphylococcus aureus - MIC*    CIPROFLOXACIN 1 SENSITIVE Sensitive     ERYTHROMYCIN RESISTANT Resistant     GENTAMICIN <=0.5 SENSITIVE Sensitive     OXACILLIN >=4 RESISTANT Resistant     TETRACYCLINE <=1 SENSITIVE Sensitive     VANCOMYCIN 1 SENSITIVE Sensitive  TRIMETH/SULFA <=10 SENSITIVE Sensitive     CLINDAMYCIN RESISTANT Resistant     RIFAMPIN <=0.5 SENSITIVE Sensitive     Inducible Clindamycin POSITIVE Resistant     * METHICILLIN RESISTANT STAPHYLOCOCCUS AUREUS  Blood Culture ID Panel (Reflexed)     Status: Abnormal   Collection Time: 2019-12-04  2:22 PM  Result Value Ref Range Status    Enterococcus species NOT DETECTED NOT DETECTED Final   Listeria monocytogenes NOT DETECTED NOT DETECTED Final   Staphylococcus species DETECTED (A) NOT DETECTED Final    Comment: CRITICAL RESULT CALLED TO, READ BACK BY AND VERIFIED WITH: DAVID BESANTI 12/03/19 AT 0105 HS    Staphylococcus aureus (BCID) DETECTED (A) NOT DETECTED Final    Comment: Methicillin (oxacillin)-resistant Staphylococcus aureus (MRSA). MRSA is predictably resistant to beta-lactam antibiotics (except ceftaroline). Preferred therapy is vancomycin unless clinically contraindicated. Patient requires contact precautions if  hospitalized. CRITICAL RESULT CALLED TO, READ BACK BY AND VERIFIED WITH: DAVID BESANTI 12/03/19 AT 0105 HS    Methicillin resistance DETECTED (A) NOT DETECTED Final    Comment: CRITICAL RESULT CALLED TO, READ BACK BY AND VERIFIED WITH: DAVID BESANTI 12/03/19 AT 0105 HS    Streptococcus species DETECTED (A) NOT DETECTED Final    Comment: CRITICAL RESULT CALLED TO, READ BACK BY AND VERIFIED WITH: DAVID BESANTI 12/03/19 AT 0105 HS    Streptococcus agalactiae NOT DETECTED NOT DETECTED Final   Streptococcus pneumoniae DETECTED (A) NOT DETECTED Final    Comment: CRITICAL RESULT CALLED TO, READ BACK BY AND VERIFIED WITH: DAVID BESANTI 12/03/19 AT 0105 HS    Streptococcus pyogenes NOT DETECTED NOT DETECTED Final   Acinetobacter baumannii NOT DETECTED NOT DETECTED Final   Enterobacteriaceae species NOT DETECTED NOT DETECTED Final   Enterobacter cloacae complex NOT DETECTED NOT DETECTED Final   Escherichia coli NOT DETECTED NOT DETECTED Final   Klebsiella oxytoca NOT DETECTED NOT DETECTED Final   Klebsiella pneumoniae NOT DETECTED NOT DETECTED Final   Proteus species NOT DETECTED NOT DETECTED Final   Serratia marcescens NOT DETECTED NOT DETECTED Final   Haemophilus influenzae NOT DETECTED NOT DETECTED Final   Neisseria meningitidis NOT DETECTED NOT DETECTED Final   Pseudomonas aeruginosa NOT DETECTED NOT DETECTED  Final   Candida albicans NOT DETECTED NOT DETECTED Final   Candida glabrata NOT DETECTED NOT DETECTED Final   Candida krusei NOT DETECTED NOT DETECTED Final   Candida parapsilosis NOT DETECTED NOT DETECTED Final   Candida tropicalis NOT DETECTED NOT DETECTED Final    Comment: Performed at [facility], [address] [place], [place] [address]  Culture, blood (routine x 2)     Status: Abnormal   Collection Time: Dec 04, 2019  3:46 PM   Specimen: BLOOD  Result Value Ref Range Status   Specimen Description   Final    BLOOD LEFT ANTECUBITAL Performed at [facility], [address] [place], [place] [address]    Special Requests   Final    BOTTLES DRAWN AEROBIC AND ANAEROBIC Blood Culture results may not be optimal due to an excessive volume of blood received in culture bottles Performed at [facility], [address] [place], [place] [address]    Culture  Setup Time   Final    GRAM POSITIVE COCCI IN BOTH AEROBIC AND ANAEROBIC BOTTLES CRITICAL VALUE NOTED.  VALUE IS CONSISTENT WITH PREVIOUSLY REPORTED AND CALLED VALUE. Performed at [facility], [address] [place], [place] [address]    Culture STAPHYLOCOCCUS AUREUS (A)  Final   Report  Status 12/05/2019 FINAL  Final  SARS Coronavirus 2 by RT PCR (hospital order, performed in [facility] hospital lab) Nasopharyngeal Nasopharyngeal Swab     Status: None   Collection Time: Dec 26, 2019  3:46 PM   Specimen: Nasopharyngeal Swab  Result Value Ref Range Status   SARS Coronavirus 2 NEGATIVE NEGATIVE Final    Comment: (NOTE) SARS-CoV-2 target nucleic acids are NOT DETECTED. The SARS-CoV-2 RNA is generally detectable in upper and lower respiratory specimens during the acute phase of infection. The lowest concentration of SARS-CoV-2 viral copies this assay can detect is 250 copies / mL. A negative result does not preclude SARS-CoV-2 infection and should not be used as the sole basis for treatment or  other patient management decisions.  A negative result may occur with improper specimen collection / handling, submission of specimen other than nasopharyngeal swab, presence of viral mutation(s) within the areas targeted by this assay, and inadequate number of viral copies (<250 copies / mL). A negative result must be combined with clinical observations, patient history, and epidemiological information. Fact Sheet for Patients:   BoilerBrush.com.cy Fact Sheet for Healthcare Providers: https://pope.com/ This test is not yet approved or cleared  by the [place] FDA and has been authorized for detection and/or diagnosis of SARS-CoV-2 by FDA under an Emergency Use Authorization (EUA).  This EUA will remain in effect (meaning this test can be used) for the duration of the COVID-19 declaration under Section 564(b)(1) of the Act, 21 U.S.C. section 360bbb-3(b)(1), unless the authorization is terminated or revoked sooner. Performed at [facility], [address], [place], [place] [address]   Urine culture     Status: None   Collection Time: 12/03/19  8:19 PM   Specimen: Urine, Random  Result Value Ref Range Status   Specimen Description   Final    URINE, RANDOM Performed at [facility], [address], [place], [place] [address]    Special Requests   Final    NONE Performed at [facility], [address] [place], [place] [address]    Culture   Final    NO GROWTH Performed at [facility], [address], [place], [place] [address]    Report Status 12/04/2019 FINAL  Final  CULTURE, BLOOD (ROUTINE X 2) w Reflex to ID Panel     Status: None (Preliminary result)   Collection Time: 12/07/19 12:25 AM   Specimen: BLOOD  Result Value Ref Range Status   Specimen Description BLOOD LEFT AC  Final   Special Requests   Final    BOTTLES DRAWN AEROBIC AND ANAEROBIC Blood Culture adequate volume   Culture    Final    NO GROWTH < 12 HOURS Performed at [facility], [address] [place], [place] [address]    Report Status PENDING  Incomplete  CULTURE, BLOOD (ROUTINE X 2) w Reflex to ID Panel     Status: None (Preliminary result)   Collection Time: 12/07/19 12:35 AM   Specimen: BLOOD  Result Value Ref Range Status   Specimen Description BLOOD LEFT HAND  Final   Special Requests   Final    BOTTLES DRAWN AEROBIC AND ANAEROBIC Blood Culture adequate volume   Culture   Final    NO GROWTH < 12 HOURS Performed at [facility], [address], [place], [place] [address]    Report Status PENDING  Incomplete              ASSESSMENT AND PLAN  SYNOPSIS  59 yo admitted for active GIB from liver cirrhosis with encephalopathy, septic shock withprogressivemultiorgan failure and septic shock +MRSA AND STREP PNEUMONIABacteremia patient is in dying process  ACUTE KIDNEY INJURY/Renal Failure -Nephrology following -continue Foley Catheter-assess need -Avoid nephrotoxic agents -Follow urine output, BMP -Ensure adequate renal perfusion, optimize oxygenation -Renal dose medications -The patient declines HD  RESPIRATORY: -Morbid obesity, possible OSA.  Will certainly impact respiratory mechanics, -Acute Hypoxemic Respiratory Failure -Volume overload, nearly 13L+ net for stay, and weight up nearly 20kg -NIV qHS/prn, VPAP ok if available   SHOCK-SEPSIS/HYPOVOLUMIC -use vasopressors to keep MAP60-65 -neo has been restricted to  CARDIAC ICU monitoring  ID -continue IV abx as prescibed -follow up cultures  GI GI PROPHYLAXIS as indicated   DIET--> as tolerated Constipation protocol as indicated  ENDO - will use ICU hypoglycemic\Hyperglycemia protocol if indicated   ELECTROLYTES -follow labs as needed -replace as needed -pharmacy consultation and following   DVT/GI PRX ordered and assessed TRANSFUSIONS AS NEEDED MONITOR FSBS I Assessed the  need for Labs I Assessed the need for Foley I Assessed the need for Central Venous Line Family Discussion when available I Assessed the need for Mobilization I made an Assessment of medications to be adjusted accordingly Safety Risk assessment completed   CASE DISCUSSED IN MULTIDISCIPLINARY ROUNDS WITH ICU TEAM   Overall, patient is critically ill, prognosis is guarded.  Patient with Multiorgan failure and at high risk for cardiac arrest and death.   AT THIS TIME, PATIENT IS DNR/DNI, PATIENT NOT A CANDIDATE FOR HD AND PATIENT REFUSED, PATIENT ACTIVELY DYING PROCESS AND WILL PROCEED WITH COMFORT MEASURES WHEN THE TIME COMES, ALL VISITORS ARE COMING TO VISIT PATIENT   E&M level 3 //Mozel Burdett

## 2019-12-07 NOTE — Progress Notes (Signed)
Pharmacy BCID Antibiotic Note  Larry Andrade. is a 60 y.o. male w/ h/o CHF, pancreatitis, cirrhosis s/t EtOH abuse, esophageal varices s/p EGD and multiple endoscopies and flex sigmoidoscopy admitted on 12/21/2019 with hematemesis. Blood cultures positive for MRSA and strep pneumoniae.  Today, 12/07/2019  Day 5 effective antibiotic therapy. vanco and ceftriaxone to ceftaroline  SCr continues to worsen.  Off vancomycin  Goals of care discussions - per notes, current plan is residential hospice when bed available  Repeat Bcx pending (6/7 TTE neg for vegetations)  Plan:  Continue ceftaroline 300mg  IV q8h for CrCl 15-10ml/min- dosed for MRSA bacteremia and adjusted for worsening SCr  Monitor renal function  Follow goals of care   Height: 6\' 2"  (188 cm) Weight: 114 kg (251 lb 5.2 oz) IBW/kg (Calculated) : 82.2  Temp (24hrs), Avg:97.7 F (36.5 C), Min:97.6 F (36.4 C), Max:98 F (36.7 C)  Recent Labs  Lab 12/25/2019 1422 11/29/2019 1422 12/23/2019 1721 12/03/19 0114 12/03/19 0238 12/04/19 0547 12/05/19 0339 12/06/19 0440 12/07/19 0025  WBC 23.2*   < >  --  24.2*  --  19.6* 23.6* 30.1* 27.7*  CREATININE 1.97*   < >  --  2.32*  --  2.72* 3.22* 3.63* 3.71*  LATICACIDVEN 7.2*  --  5.9*  --  6.8*  --   --   --   --    < > = values in this interval not displayed.    Estimated Creatinine Clearance: 29.8 mL/min (A) (by C-G formula based on SCr of 3.71 mg/dL (H)).    Allergies  Allergen Reactions  . Chlorhexidine     No  midline present  . Penicillins Rash    Has patient had a PCN reaction causing immediate rash, facial/tongue/throat swelling, SOB or lightheadedness with hypotension: No Has patient had a PCN reaction causing severe rash involving mucus membranes or skin necrosis: No Has patient had a PCN reaction that required hospitalization: No Has patient had a PCN reaction occurring within the last 10 years: No If all of the above answers are "NO", then may proceed with  Cephalosporin use.     Antimicrobials this admission: 06/06 azithro >> 06/06 06/06 CTX >> 6/9 06/06 CFP >> 06/06 06/06 vanc load >> 0606 then to restart 06/07 >> 6/9 6/9 ceftaroline   Microbiology results: 06/06 BCx: MRSA & strep pneumoniae  Thank you for allowing pharmacy to be a part of this patient's care.  8/9, PharmD, BCPS.   Work Cell: [phone] 12/07/2019 12:09 PM

## 2019-12-07 NOTE — Progress Notes (Signed)
Daily Progress Note   Patient Name: Larry Andrade.       Date: 12/07/2019 DOB: [birth year]  Age: 59 y.o. MRN#: [id number] Attending Physician: Erin Fulling, MD Primary Care Physician: Gildardo Pounds, PA Admit Date: 2019/12/21  Reason for Consultation/Follow-up: Establishing goals of care  Subjective: Patient is awake, girlfriend and "stepson" at bedside - he tells me he feels better today.   Length of Stay: 5  Current Medications: Scheduled Meds:  . sodium chloride   Intravenous Once  . allopurinol  100 mg Oral BID  . folic acid  1 mg Oral Daily  . lactulose  30 g Oral BID  . levalbuterol  0.63 mg Nebulization Q6H  . mouth rinse  15 mL Mouth Rinse BID  . metoprolol succinate  25 mg Oral BID  . midodrine  10 mg Oral TID WC  . nicotine  21 mg Transdermal Daily  . pantoprazole  40 mg Intravenous Q12H  . thiamine  100 mg Oral Daily    Continuous Infusions: . sodium chloride Stopped (12/03/19 0611)  . sodium chloride    . sodium chloride    . amiodarone 60 mg/hr (12/07/19 0930)  . ceFTAROline (TEFLARO) IV 300 mg (12/07/19 0505)  . octreotide  (SANDOSTATIN)    IV infusion 50 mcg/hr (12/07/19 0400)  . phenylephrine (NEO-SYNEPHRINE) Adult infusion 100 mcg/min (12/07/19 1002)    PRN Meds: sodium chloride, docusate sodium, guaiFENesin, levalbuterol, morphine injection, ondansetron (ZOFRAN) IV, oxyCODONE, polyethylene glycol  Physical Exam Constitutional:      General: He is not in acute distress.    Appearance: He is ill-appearing.  Cardiovascular:     Rate and Rhythm: Normal rate.  Pulmonary:     Effort: Pulmonary effort is normal. No respiratory distress.  Musculoskeletal:     Right lower leg: Edema present.     Left lower leg: Edema present.  Skin:    General: Skin is warm and dry.    Neurological:     Mental Status: He is alert and oriented to person, place, and time.             Vital Signs: BP (!) 83/56   Pulse 85   Temp 97.6 F (36.4 C) (Axillary)   Resp 15   Ht 6\' 2"  (1.88 m)   Wt 114 kg   SpO2 92%   BMI 32.27 kg/m  SpO2: SpO2: 92 % O2 Device: O2 Device: Nasal Cannula O2 Flow Rate: O2 Flow Rate (L/min): 4 L/min  Intake/output summary:   Intake/Output Summary (Last 24 hours) at 12/07/2019 1055 Last data filed at 12/07/2019 0400 Gross per 24 hour  Intake 2707.76 ml  Output 350 ml  Net 2357.76 ml   LBM: Last BM Date: 12/05/19 Baseline Weight: Weight: 90.7 kg Most recent weight: Weight: 114 kg       Palliative Assessment/Data: PPS 30%      Patient Active Problem List   Diagnosis Date Noted  . Acute respiratory failure (HCC)   . Hepatic encephalopathy syndrome (HCC) 21-Dec-2019  . Alcoholic hepatitis without ascites 11/22/2019  . Tobacco abuse 11/18/2019  . Hematemesis 11/18/2019  . Alcohol abuse   . Stage 3a chronic kidney disease   . Thrombocytopenia (  [place])   . Essential hypertension   . AF (paroxysmal atrial fibrillation) ([place])   . Chronic diastolic CHF (congestive heart failure) ([place])   . Alcoholic cirrhosis ([place])   . Gastrointestinal hemorrhage 10/06/2019  . COPD exacerbation ([place]) 10/06/2019  . Symptomatic anemia 04/06/2019  . Malnutrition of moderate degree 09/04/2018  . Sepsis ([place]) 08/26/2018  . Acute on chronic systolic CHF (congestive heart failure) ([place]) 08/17/2018  . Lactic acid acidosis 10/25/2017  . Generalized abdominal pain   . Hematemesis with nausea   . Acute blood loss anemia   . Gout attack 02/22/2017    Palliative Care Assessment & Plan   HPI: Larry Andradeis a 58 y.o.malewith medical history significant of alcohol abuse, alcoholic cirrhosis obesity esophageal varices, hypertension, COPD, GERD, gout, pancreatitis, atrial fibrillation on anticoagulants, CKD stage III, thrombocytopenia, tobacco abuse, who  presents with hematemesis.  Assessment: Follow up today with patient - his long-term girlfriend, Larry Andrade, (she is also documented HCPOA) is at bedside along with her son who says he is patient's "stepson".   We discussed Mr. Galvan condition - Mr. Stammer tells me he feels better today and slept great last night. We discussed that he continues to have renal failure and be dependent on vasopressors. He immediately expresses that he does NOT want dialysis. He further states "I know I'm dying, but I feel good today and I want to keep going".   Family expresses they want him to make his own decisions and are supportive of his decisions. Larry Andrade expresses that she understands Mr. Mcniel wishes and if he reaches a point where he is no longer able to speak for himself she will be able to make decisions to shift to full comfort care.   Encouraged patient and family to continue conversations about current condition and what care should look like moving forward given his poor prognosis. They seem to understand prognosis is poor but want to take care "one day at a time".   Recommendations/Plan:  Patient requests continuing pressors  Maintain limit of DNR and NO dialysis  Patient and family understand poor prognosis but are not yet ready to shift to full comfort measures  Goals of Care and Additional Recommendations:  Limitations on Scope of Treatment: No Hemodialysis  Code Status:  DNR  Prognosis:   Unable to determine - depends on decisions and clinical course, prognosis is poor  Discharge Planning:  To Be Determined  Care plan was discussed with patient, family, RN  Thank you for allowing the Palliative Medicine Team to assist in the care of this patient.   Total Time 25 minutes Prolonged Time Billed  no       Greater than 50%  of this time was spent counseling and coordinating care related to the above assessment and plan.  Juel Burrow, DNP, [facility] Palliative Medicine  Team Team Phone # [phone]  Pager [phone]

## 2019-12-07 NOTE — Progress Notes (Signed)
PHARMACY CONSULT NOTE - FOLLOW UP  Pharmacy Consult for Electrolyte Monitoring and Replacement   Recent Labs: Potassium (mmol/L)  Date Value  12/07/2019 4.1   Magnesium (mg/dL)  Date Value  21/62/4469 1.7   Calcium (mg/dL)  Date Value  50/72/2575 7.5 (L)   Calcium, Total (PTH) (mg/dL)  Date Value  11/12/3356 9.4   Albumin (g/dL)  Date Value  25/18/9842 2.2 (L)   Phosphorus (mg/dL)  Date Value  04/27/2810 5.3 (H)   Sodium (mmol/L)  Date Value  12/07/2019 131 (L)     Assessment: Patient is a 59 y/o M with history of end stage liver disease, alcohol abuse, esophageal varices, atrial fibrillation, COPD who is admitted to the ICU with possible GIB, septic shock secondary to bacteremia, acute renal failure. Pharmacy has been consulted to assist with electrolyte repletion.  Renal function continues to worsen. He is having multiple bowel movements per day on lactulose 30g BID. 7x bowel movements documented in chart yesterday. WBC trending up ; patient is on antibiotics for bacteremia.   Goal of Therapy:  K 4 Mg 2 All other electrolytes WNL   Plan:  No replacement indicated today. Will defer ordering electrolytes with morning labs to MD if indicated.  Pricilla Riffle, PharmD Clinical Pharmacist 12/07/2019 3:34 PM

## 2019-12-07 NOTE — TOC Progression Note (Signed)
Transition of Care (TOC) - Progression Note    Patient Details  Name: Larry Andrade. MRN: [id number] Date of Birth: 11/22/1960  Transition of Care [facility]) CM/SW Contact  Charlayne Vultaggio Synetta Fail, LCSW Phone Number: 12/07/2019, 12:42 PM  Clinical Narrative:   Per rounds, patient wanting to continue with pressors due to feeling well today. CSW called [facility] Representative Clydie Braun. Clydie Braun reported she has spoken with family and they are aware that patient would not be able to transition to [facility] if still on pressors. She reported she will continue to follow but at this time patient would not be Hospice Home appropriate.     Expected Discharge Plan: Home w Hospice Care    Expected Discharge Plan and Services Expected Discharge Plan: Home w Hospice Care     Post Acute Care Choice: Hospice Living arrangements for the past 2 months: Single Family Home                                       Social Determinants of Health (SDOH) Interventions    Readmission Risk Interventions Readmission Risk Prevention Plan 12/06/2019 11/19/2019 10/08/2019  Transportation Screening Complete Complete Complete  PCP or Specialist Appt within 3-5 Days - Complete Complete  HRI or Home Care Consult - Complete Complete  Social Work Consult for Recovery Care Planning/Counseling - - Patient refused  Palliative Care Screening - Complete Not Applicable  Medication Review Oceanographer) Complete Complete Complete  Palliative Care Screening Complete - -  Skilled Nursing Facility Not Applicable - -  Some recent data might be hidden

## 2019-12-07 NOTE — Progress Notes (Addendum)
   12/06/19 2000  Clinical Encounter Type  Visited With Patient  Visit Type Initial;Spiritual support  Referral From Chaplain  Consult/Referral To Chaplain  At the recommendation of a nurse chaplain visited with patient. Chaplain welcomed chaplain's visit. Nurse mention patient wanting to die, but he did not mention dying to chaplain. Patient talked about his church that is up the road from his house. He talked about the rocks in the road. Patient said that he has known God most life and he talked about his love for God. Patient said he should be right with God. He also said that his mother took him to church and he knows. Chaplain prayed for patient. Another chaplain's visit will probably be welcomed because it seem like patient wanted to talk more, but chaplain had to go to another room. Chaplain will ask Chaplain Waters to follow up with this patient.

## 2019-12-07 NOTE — Progress Notes (Signed)
[facility]  ROUNDING NOTE   Subjective:   Patient does not want comfort care anymore. But states he is still not interested in dialysis   Objective:  Vital signs in last 24 hours:  Temp:  [97.6 F (36.4 C)-98 F (36.7 C)] 97.6 F (36.4 C) (06/11 0400) Pulse Rate:  [52-118] 85 (06/11 0600) Resp:  [10-27] 15 (06/11 0600) BP: (68-95)/(50-84) 83/56 (06/11 0600) SpO2:  [73 %-96 %] 92 % (06/11 0600) Weight:  [762 kg] 114 kg (06/11 0345)  Weight change: 1.9 kg Filed Weights   12/05/19 0500 12/06/19 0500 12/07/19 0345  Weight: 102.6 kg 112.1 kg 114 kg    Intake/Output: I/O last 3 completed shifts: In: 5970.9 [P.O.:480; I.V.:4490.9; IV Piggyback:1000] Out: 425 [Urine:425]   Intake/Output this shift:  No intake/output data recorded.  Physical Exam: General: Critically ill  Head: Normocephalic, atraumatic. Moist oral mucosal membranes  Eyes: +scleral icterus  Neck: trachea midline  Lungs:  Diminished bilaterally  Heart: Irregular, tachycardia  Abdomen:  +ascites  Extremities:  ++ peripheral edema.  Neurologic: Alert and oriented, +asterixis.   Skin: +jaundice        Basic Metabolic Panel: Recent Labs  Lab 12/03/19 0114 12/03/19 0114 12/04/19 0547 12/04/19 0547 12/05/19 0339 12/06/19 0440 12/07/19 0025  NA 132*  --  132*  --  132* 132* 131*  K 4.4  --  4.0  --  3.8 3.9 4.1  CL 95*  --  96*  --  96* 98 97*  CO2 20*  --  22  --  21* 18* 18*  GLUCOSE 182*  --  113*  --  128* 96 83  BUN 55*  --  69*  --  75* 79* 76*  CREATININE 2.32*  --  2.72*  --  3.22* 3.63* 3.71*  CALCIUM 7.0*   < > 7.0*   < > 7.2* 7.4* 7.5*  MG 1.4*  --  1.7  --  2.0 1.8 1.7  PHOS 5.3*  --  3.8  --  4.5 4.7* 5.3*   < > = values in this interval not displayed.    Liver Function Tests: Recent Labs  Lab 12/16/2019 1422 12/04/19 0547 12/05/19 0339 12/06/19 0440 12/07/19 0025  AST 61* 43* 57* 72* 56*  ALT 44 32 34 38 36  ALKPHOS 119 96 121 191* 213*  BILITOT 8.8* 6.0* 6.1*  5.8* 5.5*  PROT 6.1* 5.6* 5.9* 5.8* 5.8*  ALBUMIN 2.1* 2.4* 2.6* 2.3* 2.2*   Recent Labs  Lab 12/11/2019 1422  LIPASE 25  AMYLASE 20*   Recent Labs  Lab 12/10/2019 1422  AMMONIA 83*    CBC: Recent Labs  Lab 12/03/19 0114 12/03/19 0719 12/04/19 0547 12/04/19 1920 12/05/19 0001 12/05/19 0339 12/05/19 2020 12/06/19 0440 12/07/19 0025  WBC 24.2*  --  19.6*  --   --  23.6*  --  30.1* 27.7*  HGB 8.0*   < > 7.1*   < > 7.8* 7.8* 7.8* 7.8* 7.7*  HCT 24.5*   < > 20.8*   < > 23.3* 23.4* 22.5* 22.0* 22.0*  MCV 108.4*  --  103.5*  --   --  103.1*  --  100.0 100.0  PLT 124*  --  93*  --   --  76*  --  88* 96*   < > = values in this interval not displayed.    Cardiac Enzymes: No results for input(s): CKTOTAL, CKMB, CKMBINDEX, TROPONINI in the last 168 hours.  BNP: Invalid input(s): POCBNP  CBG: Recent Labs  Lab 12-08-2019 1756  GLUCAP 131*    Microbiology: Results for orders placed or performed during the hospital encounter of December 08, 2019  Culture, blood (routine x 2)     Status: Abnormal   Collection Time: December 08, 2019  2:22 PM   Specimen: BLOOD  Result Value Ref Range Status   Specimen Description   Final    BLOOD LEFT ANTECUBITAL Performed at [facility], [address], [place], [place] [address]    Special Requests   Final    BOTTLES DRAWN AEROBIC AND ANAEROBIC Blood Culture adequate volume Performed at [facility], [address] [place], [place] [address]    Culture  Setup Time   Final    Organism ID to follow GRAM POSITIVE COCCI IN BOTH AEROBIC AND ANAEROBIC BOTTLES CRITICAL RESULT CALLED TO, READ BACK BY AND VERIFIED WITH: DAVID BESANTI 12/03/19 AT 0105 HS Performed at [facility], [address] [place], [place] [address]    Culture (A)  Final    METHICILLIN RESISTANT STAPHYLOCOCCUS AUREUS STREPTOCOCCUS PNEUMONIAE nonviable    Report Status 12/05/2019 FINAL  Final   Organism ID, Bacteria METHICILLIN RESISTANT STAPHYLOCOCCUS  AUREUS  Final      Susceptibility   Methicillin resistant staphylococcus aureus - MIC*    CIPROFLOXACIN 1 SENSITIVE Sensitive     ERYTHROMYCIN RESISTANT Resistant     GENTAMICIN <=0.5 SENSITIVE Sensitive     OXACILLIN >=4 RESISTANT Resistant     TETRACYCLINE <=1 SENSITIVE Sensitive     VANCOMYCIN 1 SENSITIVE Sensitive     TRIMETH/SULFA <=10 SENSITIVE Sensitive     CLINDAMYCIN RESISTANT Resistant     RIFAMPIN <=0.5 SENSITIVE Sensitive     Inducible Clindamycin POSITIVE Resistant     * METHICILLIN RESISTANT STAPHYLOCOCCUS AUREUS  Blood Culture ID Panel (Reflexed)     Status: Abnormal   Collection Time: 12-08-2019  2:22 PM  Result Value Ref Range Status   Enterococcus species NOT DETECTED NOT DETECTED Final   Listeria monocytogenes NOT DETECTED NOT DETECTED Final   Staphylococcus species DETECTED (A) NOT DETECTED Final    Comment: CRITICAL RESULT CALLED TO, READ BACK BY AND VERIFIED WITH: DAVID BESANTI 12/03/19 AT 0105 HS    Staphylococcus aureus (BCID) DETECTED (A) NOT DETECTED Final    Comment: Methicillin (oxacillin)-resistant Staphylococcus aureus (MRSA). MRSA is predictably resistant to beta-lactam antibiotics (except ceftaroline). Preferred therapy is vancomycin unless clinically contraindicated. Patient requires contact precautions if  hospitalized. CRITICAL RESULT CALLED TO, READ BACK BY AND VERIFIED WITH: DAVID BESANTI 12/03/19 AT 0105 HS    Methicillin resistance DETECTED (A) NOT DETECTED Final    Comment: CRITICAL RESULT CALLED TO, READ BACK BY AND VERIFIED WITH: DAVID BESANTI 12/03/19 AT 0105 HS    Streptococcus species DETECTED (A) NOT DETECTED Final    Comment: CRITICAL RESULT CALLED TO, READ BACK BY AND VERIFIED WITH: DAVID BESANTI 12/03/19 AT 0105 HS    Streptococcus agalactiae NOT DETECTED NOT DETECTED Final   Streptococcus pneumoniae DETECTED (A) NOT DETECTED Final    Comment: CRITICAL RESULT CALLED TO, READ BACK BY AND VERIFIED WITH: DAVID BESANTI 12/03/19 AT 0105 HS     Streptococcus pyogenes NOT DETECTED NOT DETECTED Final   Acinetobacter baumannii NOT DETECTED NOT DETECTED Final   Enterobacteriaceae species NOT DETECTED NOT DETECTED Final   Enterobacter cloacae complex NOT DETECTED NOT DETECTED Final   Escherichia coli NOT DETECTED NOT DETECTED Final   Klebsiella oxytoca NOT DETECTED NOT DETECTED Final   Klebsiella pneumoniae NOT DETECTED NOT  DETECTED Final   Proteus species NOT DETECTED NOT DETECTED Final   Serratia marcescens NOT DETECTED NOT DETECTED Final   Haemophilus influenzae NOT DETECTED NOT DETECTED Final   Neisseria meningitidis NOT DETECTED NOT DETECTED Final   Pseudomonas aeruginosa NOT DETECTED NOT DETECTED Final   Candida albicans NOT DETECTED NOT DETECTED Final   Candida glabrata NOT DETECTED NOT DETECTED Final   Candida krusei NOT DETECTED NOT DETECTED Final   Candida parapsilosis NOT DETECTED NOT DETECTED Final   Candida tropicalis NOT DETECTED NOT DETECTED Final    Comment: Performed at [facility], [address] [place], [place] [address]  Culture, blood (routine x 2)     Status: Abnormal   Collection Time: 12-24-19  3:46 PM   Specimen: BLOOD  Result Value Ref Range Status   Specimen Description   Final    BLOOD LEFT ANTECUBITAL Performed at [facility], [address] [place], [place] [address]    Special Requests   Final    BOTTLES DRAWN AEROBIC AND ANAEROBIC Blood Culture results may not be optimal due to an excessive volume of blood received in culture bottles Performed at [facility], [address] [place], [place] [address]    Culture  Setup Time   Final    GRAM POSITIVE COCCI IN BOTH AEROBIC AND ANAEROBIC BOTTLES CRITICAL VALUE NOTED.  VALUE IS CONSISTENT WITH PREVIOUSLY REPORTED AND CALLED VALUE. Performed at [facility], [address] [place], [place] [address]    Culture STAPHYLOCOCCUS AUREUS (A)  Final   Report Status 12/05/2019 FINAL  Final  SARS Coronavirus 2  by RT PCR (hospital order, performed in [facility] hospital lab) Nasopharyngeal Nasopharyngeal Swab     Status: None   Collection Time: 12-24-19  3:46 PM   Specimen: Nasopharyngeal Swab  Result Value Ref Range Status   SARS Coronavirus 2 NEGATIVE NEGATIVE Final    Comment: (NOTE) SARS-CoV-2 target nucleic acids are NOT DETECTED. The SARS-CoV-2 RNA is generally detectable in upper and lower respiratory specimens during the acute phase of infection. The lowest concentration of SARS-CoV-2 viral copies this assay can detect is 250 copies / mL. A negative result does not preclude SARS-CoV-2 infection and should not be used as the sole basis for treatment or other patient management decisions.  A negative result may occur with improper specimen collection / handling, submission of specimen other than nasopharyngeal swab, presence of viral mutation(s) within the areas targeted by this assay, and inadequate number of viral copies (<250 copies / mL). A negative result must be combined with clinical observations, patient history, and epidemiological information. Fact Sheet for Patients:   BoilerBrush.com.cyhttps://www.fda.gov/media/136312/download Fact Sheet for Healthcare Providers: https://pope.com/https://www.fda.gov/media/136313/download This test is not yet approved or cleared  by the [place] FDA and has been authorized for detection and/or diagnosis of SARS-CoV-2 by FDA under an Emergency Use Authorization (EUA).  This EUA will remain in effect (meaning this test can be used) for the duration of the COVID-19 declaration under Section 564(b)(1) of the Act, 21 U.S.C. section 360bbb-3(b)(1), unless the authorization is terminated or revoked sooner. Performed at [facility], [address] [place], [place] [address]   Urine culture     Status: None   Collection Time: 12/03/19  8:19 PM   Specimen: Urine, Random  Result Value Ref Range Status   Specimen Description   Final    URINE, RANDOM Performed at  [facility], [address] [place], [place] [address]  Special Requests   Final    NONE Performed at [facility], [address], [place], [place] [address]    Culture   Final    NO GROWTH Performed at [facility], [place] [address], [place], [place] [address]    Report Status 12/04/2019 FINAL  Final  CULTURE, BLOOD (ROUTINE X 2) w Reflex to ID Panel     Status: None (Preliminary result)   Collection Time: 12/07/19 12:25 AM   Specimen: BLOOD  Result Value Ref Range Status   Specimen Description BLOOD LEFT AC  Final   Special Requests   Final    BOTTLES DRAWN AEROBIC AND ANAEROBIC Blood Culture adequate volume   Culture   Final    NO GROWTH < 12 HOURS Performed at [facility], [address], [place], [place] [address]    Report Status PENDING  Incomplete  CULTURE, BLOOD (ROUTINE X 2) w Reflex to ID Panel     Status: None (Preliminary result)   Collection Time: 12/07/19 12:35 AM   Specimen: BLOOD  Result Value Ref Range Status   Specimen Description BLOOD LEFT HAND  Final   Special Requests   Final    BOTTLES DRAWN AEROBIC AND ANAEROBIC Blood Culture adequate volume   Culture   Final    NO GROWTH < 12 HOURS Performed at [facility], [address] [place], [place] [address]    Report Status PENDING  Incomplete    Coagulation Studies: No results for input(s): LABPROT, INR in the last 72 hours.  Urinalysis: No results for input(s): COLORURINE, LABSPEC, PHURINE, GLUCOSEU, HGBUR, BILIRUBINUR, KETONESUR, PROTEINUR, UROBILINOGEN, NITRITE, LEUKOCYTESUR in the last 72 hours.  Invalid input(s): APPERANCEUR    Imaging: No results found.   Medications:   . sodium chloride Stopped (12/03/19 2956)  . sodium chloride    . sodium chloride    . amiodarone 60 mg/hr (12/07/19 0930)  . ceFTAROline (TEFLARO) IV 300 mg (12/07/19 0505)  . octreotide  (SANDOSTATIN)    IV infusion 50 mcg/hr (12/07/19 0400)  . phenylephrine  (NEO-SYNEPHRINE) Adult infusion 100 mcg/min (12/07/19 1002)   . sodium chloride   Intravenous Once  . allopurinol  100 mg Oral BID  . folic acid  1 mg Oral Daily  . lactulose  30 g Oral BID  . levalbuterol  0.63 mg Nebulization Q6H  . mouth rinse  15 mL Mouth Rinse BID  . metoprolol succinate  25 mg Oral BID  . midodrine  10 mg Oral TID WC  . nicotine  21 mg Transdermal Daily  . pantoprazole  40 mg Intravenous Q12H  . thiamine  100 mg Oral Daily   sodium chloride, docusate sodium, guaiFENesin, levalbuterol, morphine injection, ondansetron (ZOFRAN) IV, oxyCODONE, polyethylene glycol  Assessment/ Plan:  Mr. Michiah Mudry. is a 58 y.o. white male with cirrhosis, congestive heart failure, gout, GERD, hypertension, pancreatitis, alcohol abuse who was admitted to [facility] on 12/22/2019 for Hepatic encephalopathy syndrome ([place]) [K72.90] Sepsis, due to unspecified organism, unspecified whether acute organ dysfunction present ([place]) [A41.9]  1. Acute renal failure on chronic kidney disease stage IIIA: baseline creatinine of 1.45, GFR of 53 on 4/14.  History of bland urine.  Chronic kidney disease secondary to hypertension Acute renal failure secondary to ATN Nonoliguric urine output. Patient does not want renal replacement therapy.   2. Anion gap metabolic acidosis: with renal failure and liver failure and ongoing alcohol use  3. Hyponatremia: secondary to cirrhosis.   4. Anasarca  with hypoalbuminemia: albumin 2.2  5. Anemia with renal failure: macrocytosis: with alcohol abuse.  Recent GI bleed. Hemoglobin stable at 7.7  6. 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    LOS: 5 [place] 6/11/202110:18 AM

## 2019-12-07 NOTE — Progress Notes (Signed)
Pt with no urine output. Bladder scan performed and 34ml of urine found. Md notified, no new orders at this time. Will continue to monitor.

## 2019-12-07 NOTE — Progress Notes (Addendum)
CH visited pt. as follow-up from prior visits; pt. in bed lying back watching TV, complaining of nausea with plastic bag in hand; pt.'s stepson Larry Andrade, Larry Andrade, mother Larry Andrade and stepfthr. Larry Andrade in rm.  Pt. says he feels similarly today to yesterday, just nauseous.  Pt.'s mother talked about how much pt. and his father liked to fish; 'we never went on a vacation that didn't revolve around fishing,' she said.  Pt. requested prayer before CH left and CH prayed for pt.'s sense of divine nearness and love and prayed for family as they grieve pt.'s illness and likely imminent death.  CH remains available as needed.    12/07/19 1110  Clinical Encounter Type  Visited With Patient and family together;Health care provider  Visit Type Follow-up;Psychological support;Spiritual support;Social support;Critical Care  Spiritual Encounters  Spiritual Needs Emotional;Prayer  Stress Factors  Patient Stress Factors Health changes;Major life changes;Loss of control;Loss;Family relationships  Family Stress Factors Loss;Loss of control;Major life changes;Health changes

## 2019-12-08 DIAGNOSIS — K7469 Other cirrhosis of liver: Secondary | ICD-10-CM

## 2019-12-08 LAB — COMPREHENSIVE METABOLIC PANEL
ALT: 30 U/L (ref 0–44)
AST: 48 U/L — ABNORMAL HIGH (ref 15–41)
Albumin: 2 g/dL — ABNORMAL LOW (ref 3.5–5.0)
Alkaline Phosphatase: 239 U/L — ABNORMAL HIGH (ref 38–126)
Anion gap: 17 — ABNORMAL HIGH (ref 5–15)
BUN: 77 mg/dL — ABNORMAL HIGH (ref 6–20)
CO2: 16 mmol/L — ABNORMAL LOW (ref 22–32)
Calcium: 7.5 mg/dL — ABNORMAL LOW (ref 8.9–10.3)
Chloride: 97 mmol/L — ABNORMAL LOW (ref 98–111)
Creatinine, Ser: 4.42 mg/dL — ABNORMAL HIGH (ref 0.61–1.24)
GFR calc Af Amer: 16 mL/min — ABNORMAL LOW (ref >60)
GFR calc non Af Amer: 14 mL/min — ABNORMAL LOW (ref >60)
Glucose, Bld: 97 mg/dL (ref 70–99)
Potassium: 4.3 mmol/L (ref 3.5–5.1)
Sodium: 130 mmol/L — ABNORMAL LOW (ref 135–145)
Total Bilirubin: 4.9 mg/dL — ABNORMAL HIGH (ref 0.3–1.2)
Total Protein: 5.5 g/dL — ABNORMAL LOW (ref 6.5–8.1)

## 2019-12-08 LAB — CBC
HCT: 22.1 % — ABNORMAL LOW (ref 39.0–52.0)
Hemoglobin: 7.3 g/dL — ABNORMAL LOW (ref 13.0–17.0)
MCH: 34 pg (ref 26.0–34.0)
MCHC: 33 g/dL (ref 30.0–36.0)
MCV: 102.8 fL — ABNORMAL HIGH (ref 80.0–100.0)
Platelets: 93 10*3/uL — ABNORMAL LOW (ref 150–400)
RBC: 2.15 MIL/uL — ABNORMAL LOW (ref 4.22–5.81)
RDW: 22.8 % — ABNORMAL HIGH (ref 11.5–15.5)
WBC: 25 10*3/uL — ABNORMAL HIGH (ref 4.0–10.5)
nRBC: 0.2 % (ref 0.0–0.2)

## 2019-12-08 LAB — MAGNESIUM: Magnesium: 1.8 mg/dL (ref 1.7–2.4)

## 2019-12-08 LAB — PHOSPHORUS: Phosphorus: 6.6 mg/dL — ABNORMAL HIGH (ref 2.5–4.6)

## 2019-12-08 MED ORDER — HYDROMORPHONE HCL 1 MG/ML IJ SOLN
0.5000 mg | INTRAMUSCULAR | Status: DC | PRN
  Administered 2019-12-08 – 2019-12-09 (×2): 0.5 mg via INTRAVENOUS
  Filled 2019-12-08 (×3): qty 1

## 2019-12-08 MED ORDER — MAGNESIUM SULFATE 2 GM/50ML IV SOLN
2.0000 g | Freq: Once | INTRAVENOUS | Status: AC
  Administered 2019-12-08: 2 g via INTRAVENOUS
  Filled 2019-12-08: qty 50

## 2019-12-08 NOTE — Progress Notes (Signed)
CRITICAL CARE NOTE  59 y.o.malewith medical history significant of alcohol abuse, alcoholic cirrhosis obesity esophageal varices, hypertension, COPD, GERD, gout, pancreatitis, atrial fibrillation on anticoagulants, CKD stage III, thrombocytopenia, tobacco abuse, who presents with hematemesis. Patient has been having recurrenthematemesisbut was not vomiting blood around mouth and chin. Patient has abdominal distention and skin jaundice.active smoking appx 1 pack daily. He is jaundiced and in anasarca. Critical care admission is requested due to encephalopathy, anasarca and possible GIB.    Lines / Drains: PIVx2  Cultures / Sepsis markers: Blood and urine  Antibiotics: Rocephin   Protocols / Consultants: GI, PCCM  6/6 admitted for active GIB from liver cirrhosis with encephalopathy, septic shock 6/7 MRSA bacteremia and STEP PNEUMONIA bacteramia 6/8 active bleeding, worsening jaundice and renal failure 6/9 active bleeding, shock, on pressors 6/10 active bleeding, shock, on pressors   CC  follow up shock  HPI Patient remains critically ill Prognosis is guarded Alert and awake On pressors   BP (!) 87/46   Pulse 77   Temp 97.7 F (36.5 C) (Oral)   Resp 13   Ht 6\' 2"  (1.88 m)   Wt 120 kg   SpO2 90%   BMI 33.97 kg/m    I/O last 3 completed shifts: In: 5973.8 [I.V.:4723.8; IV Piggyback:1250] Out: 50 [Urine:50] Total I/O In: 128 [I.V.:128] Out: -   SpO2: 90 % O2 Flow Rate (L/min): 4 L/min  Estimated body mass index is 33.97 kg/m as calculated from the following:   Height as of this encounter: 6\' 2"  (1.88 m).   Weight as of this encounter: 120 kg.     PHYSICAL EXAMINATION:  GENERAL:critically ill appearing, +jaundice HEAD: Normocephalic, atraumatic.  EYES: +scleral icterus.  MOUTH: Moist mucosal membrane. NECK: Supple.  PULMONARY: +rhonchi, +wheezing CARDIOVASCULAR: S1 and S2. IRRR, rate controlled  No murmurs, rubs, or gallops.   GASTROINTESTINAL: Soft, nontender, +distended.  Positive bowel sounds.   MUSCULOSKELETAL: 2+ edema.  NEUROLOGIC: alert and awake SKIN:intact,warm,dry  MEDICATIONS: I have reviewed all medications and confirmed regimen as documented   CULTURE RESULTS   Recent Results (from the past 240 hour(s))  Culture, blood (routine x 2)     Status: Abnormal   Collection Time: 12/11/2019  2:22 PM   Specimen: BLOOD  Result Value Ref Range Status   Specimen Description   Final    BLOOD LEFT ANTECUBITAL Performed at [facility], [address] [place], [address] [place]    Special Requests   Final    BOTTLES DRAWN AEROBIC AND ANAEROBIC Blood Culture adequate volume Performed at [facility], [address] [place], [address] [place]    Culture  Setup Time   Final    Organism ID to follow GRAM POSITIVE COCCI IN BOTH AEROBIC AND ANAEROBIC BOTTLES CRITICAL RESULT CALLED TO, READ BACK BY AND VERIFIED WITH: DAVID BESANTI 12/03/19 AT 0105 HS Performed at [facility], [address] [place], [address] [place]    Culture (A)  Final    METHICILLIN RESISTANT STAPHYLOCOCCUS AUREUS STREPTOCOCCUS PNEUMONIAE nonviable    Report Status 12/05/2019 FINAL  Final   Organism ID, Bacteria METHICILLIN RESISTANT STAPHYLOCOCCUS AUREUS  Final      Susceptibility   Methicillin resistant staphylococcus aureus - MIC*    CIPROFLOXACIN 1 SENSITIVE Sensitive     ERYTHROMYCIN RESISTANT Resistant     GENTAMICIN <=0.5 SENSITIVE Sensitive     OXACILLIN >=4 RESISTANT Resistant     TETRACYCLINE <=1 SENSITIVE Sensitive     VANCOMYCIN 1 SENSITIVE Sensitive  TRIMETH/SULFA <=10 SENSITIVE Sensitive     CLINDAMYCIN RESISTANT Resistant     RIFAMPIN <=0.5 SENSITIVE Sensitive     Inducible Clindamycin POSITIVE Resistant     * METHICILLIN RESISTANT STAPHYLOCOCCUS AUREUS  Blood Culture ID Panel (Reflexed)     Status: Abnormal   Collection Time: 2019-12-04  2:22 PM  Result Value Ref Range Status    Enterococcus species NOT DETECTED NOT DETECTED Final   Listeria monocytogenes NOT DETECTED NOT DETECTED Final   Staphylococcus species DETECTED (A) NOT DETECTED Final    Comment: CRITICAL RESULT CALLED TO, READ BACK BY AND VERIFIED WITH: DAVID BESANTI 12/03/19 AT 0105 HS    Staphylococcus aureus (BCID) DETECTED (A) NOT DETECTED Final    Comment: Methicillin (oxacillin)-resistant Staphylococcus aureus (MRSA). MRSA is predictably resistant to beta-lactam antibiotics (except ceftaroline). Preferred therapy is vancomycin unless clinically contraindicated. Patient requires contact precautions if  hospitalized. CRITICAL RESULT CALLED TO, READ BACK BY AND VERIFIED WITH: DAVID BESANTI 12/03/19 AT 0105 HS    Methicillin resistance DETECTED (A) NOT DETECTED Final    Comment: CRITICAL RESULT CALLED TO, READ BACK BY AND VERIFIED WITH: DAVID BESANTI 12/03/19 AT 0105 HS    Streptococcus species DETECTED (A) NOT DETECTED Final    Comment: CRITICAL RESULT CALLED TO, READ BACK BY AND VERIFIED WITH: DAVID BESANTI 12/03/19 AT 0105 HS    Streptococcus agalactiae NOT DETECTED NOT DETECTED Final   Streptococcus pneumoniae DETECTED (A) NOT DETECTED Final    Comment: CRITICAL RESULT CALLED TO, READ BACK BY AND VERIFIED WITH: DAVID BESANTI 12/03/19 AT 0105 HS    Streptococcus pyogenes NOT DETECTED NOT DETECTED Final   Acinetobacter baumannii NOT DETECTED NOT DETECTED Final   Enterobacteriaceae species NOT DETECTED NOT DETECTED Final   Enterobacter cloacae complex NOT DETECTED NOT DETECTED Final   Escherichia coli NOT DETECTED NOT DETECTED Final   Klebsiella oxytoca NOT DETECTED NOT DETECTED Final   Klebsiella pneumoniae NOT DETECTED NOT DETECTED Final   Proteus species NOT DETECTED NOT DETECTED Final   Serratia marcescens NOT DETECTED NOT DETECTED Final   Haemophilus influenzae NOT DETECTED NOT DETECTED Final   Neisseria meningitidis NOT DETECTED NOT DETECTED Final   Pseudomonas aeruginosa NOT DETECTED NOT DETECTED  Final   Candida albicans NOT DETECTED NOT DETECTED Final   Candida glabrata NOT DETECTED NOT DETECTED Final   Candida krusei NOT DETECTED NOT DETECTED Final   Candida parapsilosis NOT DETECTED NOT DETECTED Final   Candida tropicalis NOT DETECTED NOT DETECTED Final    Comment: Performed at [facility], [address] [place], [place] [address]  Culture, blood (routine x 2)     Status: Abnormal   Collection Time: Dec 04, 2019  3:46 PM   Specimen: BLOOD  Result Value Ref Range Status   Specimen Description   Final    BLOOD LEFT ANTECUBITAL Performed at [facility], [address] [place], [place] [address]    Special Requests   Final    BOTTLES DRAWN AEROBIC AND ANAEROBIC Blood Culture results may not be optimal due to an excessive volume of blood received in culture bottles Performed at [facility], [address] [place], [place] [address]    Culture  Setup Time   Final    GRAM POSITIVE COCCI IN BOTH AEROBIC AND ANAEROBIC BOTTLES CRITICAL VALUE NOTED.  VALUE IS CONSISTENT WITH PREVIOUSLY REPORTED AND CALLED VALUE. Performed at [facility], [address] [place], [place] [address]    Culture STAPHYLOCOCCUS AUREUS (A)  Final   Report  Status 12/05/2019 FINAL  Final  SARS Coronavirus 2 by RT PCR (hospital order, performed in [facility] hospital lab) Nasopharyngeal Nasopharyngeal Swab     Status: None   Collection Time: 12/18/2019  3:46 PM   Specimen: Nasopharyngeal Swab  Result Value Ref Range Status   SARS Coronavirus 2 NEGATIVE NEGATIVE Final    Comment: (NOTE) SARS-CoV-2 target nucleic acids are NOT DETECTED. The SARS-CoV-2 RNA is generally detectable in upper and lower respiratory specimens during the acute phase of infection. The lowest concentration of SARS-CoV-2 viral copies this assay can detect is 250 copies / mL. A negative result does not preclude SARS-CoV-2 infection and should not be used as the sole basis for treatment or  other patient management decisions.  A negative result may occur with improper specimen collection / handling, submission of specimen other than nasopharyngeal swab, presence of viral mutation(s) within the areas targeted by this assay, and inadequate number of viral copies (<250 copies / mL). A negative result must be combined with clinical observations, patient history, and epidemiological information. Fact Sheet for Patients:   BoilerBrush.com.cy Fact Sheet for Healthcare Providers: https://pope.com/ This test is not yet approved or cleared  by the [place] FDA and has been authorized for detection and/or diagnosis of SARS-CoV-2 by FDA under an Emergency Use Authorization (EUA).  This EUA will remain in effect (meaning this test can be used) for the duration of the COVID-19 declaration under Section 564(b)(1) of the Act, 21 U.S.C. section 360bbb-3(b)(1), unless the authorization is terminated or revoked sooner. Performed at [facility], [address] [place], [place] [address]   Urine culture     Status: None   Collection Time: 12/03/19  8:19 PM   Specimen: Urine, Random  Result Value Ref Range Status   Specimen Description   Final    URINE, RANDOM Performed at [facility], [address], [place], [place] [address]    Special Requests   Final    NONE Performed at [facility], [address], [place], [place] [address]    Culture   Final    NO GROWTH Performed at [facility], [address], [place], [place] [address]    Report Status 12/04/2019 FINAL  Final  CULTURE, BLOOD (ROUTINE X 2) w Reflex to ID Panel     Status: None (Preliminary result)   Collection Time: 12/07/19 12:25 AM   Specimen: BLOOD  Result Value Ref Range Status   Specimen Description BLOOD LEFT [facility]  Final   Special Requests   Final    BOTTLES DRAWN AEROBIC AND ANAEROBIC Blood Culture adequate volume   Culture    Final    NO GROWTH 1 DAY Performed at [facility], [address], [place], [place] [address]    Report Status PENDING  Incomplete  CULTURE, BLOOD (ROUTINE X 2) w Reflex to ID Panel     Status: None (Preliminary result)   Collection Time: 12/07/19 12:35 AM   Specimen: BLOOD  Result Value Ref Range Status   Specimen Description BLOOD LEFT HAND  Final   Special Requests   Final    BOTTLES DRAWN AEROBIC AND ANAEROBIC Blood Culture adequate volume   Culture   Final    NO GROWTH 1 DAY Performed at [facility], [address] [place], [place] [address]    Report Status PENDING  Incomplete              ASSESSMENT AND PLAN SYNOPSIS  59 yo admitted for active GIB from liver cirrhosis with encephalopathy, septic shock withprogressivemultiorgan failure and septic shock +MRSA AND STREP PNEUMONIABacteremia patient is in dying process  ACUTE KIDNEY INJURY/Renal Failure -Nephrology following -continue Foley Catheter-assess need -Avoid nephrotoxic agents -Follow urine output, BMP -Ensure adequate renal perfusion, optimize oxygenation -Renal dose medications -The patient declines HD  RESPIRATORY: -Morbid obesity, possible OSA.  Will certainly impact respiratory mechanics, -Acute Hypoxemic Respiratory Failure -Volume overload,over 17L+ net for stay, and weight up over 25kg -NIV qHS/prn, VPAP ok if available   SHOCK-SEPSIS/HYPOVOLUMIC -use vasopressors to keep MAP60-65 -neo has been restricted to 40mcg, presently at 62mcg, will not up-titrate  CARDIAC ICU monitoring  ID -continue IV abx as prescibed -follow up cultures  GI GI PROPHYLAXIS as indicated   DIET--> as tolerated Constipation protocol as indicated  ENDO - will use ICU hypoglycemic\Hyperglycemia protocol if indicated   ELECTROLYTES -follow labs as needed -replace as needed -pharmacy consultation and following   DVT/GI PRX ordered and assessed TRANSFUSIONS AS  NEEDED MONITOR FSBS I Assessed the need for Labs I Assessed the need for Foley I Assessed the need for Central Venous Line Family Discussion when available I Assessed the need for Mobilization I made an Assessment of medications to be adjusted accordingly Safety Risk assessment completed   GOALS OF CARE:  The patient requests to be made more comfortable and recognizes this will shift his goals of care. He understands pain management will impact his blood pressure. Meanwhile will not up-titrate pressors or advance medical management. The patient has already declined hemodialysis and made himself DNR. Please see the goals of care progress note of today.    Overall, patient is critically ill, prognosis is guarded.  Patient with Multiorgan failure and at high risk for cardiac arrest and death.   AT THIS TIME, PATIENT IS DNR/DNI, PATIENT NOT A CANDIDATE FOR HD AND PATIENT REFUSED, PATIENT ACTIVELY DYING PROCESS AND WILL PROCEED WITH COMFORT MEASURES WHEN THE TIME COMES, ALL VISITORS ARE COMING TO VISIT PATIENT   E&M level 3 //Axil Copeman

## 2019-12-08 NOTE — Progress Notes (Signed)
PHARMACY CONSULT NOTE  Pharmacy Consult for Electrolyte Monitoring and Replacement   Recent Labs: Potassium (mmol/L)  Date Value  12/08/2019 4.3   Magnesium (mg/dL)  Date Value  23/53/6144 1.8   Calcium (mg/dL)  Date Value  31/54/0086 7.5 (L)   Calcium, Total (PTH) (mg/dL)  Date Value  76/19/5093 9.4   Albumin (g/dL)  Date Value  26/71/2458 2.0 (L)   Phosphorus (mg/dL)  Date Value  09/98/3382 6.6 (H)   Sodium (mmol/L)  Date Value  12/08/2019 130 (L)   Corrected Ca: 9.1 mg/dL  Assessment: Patient is a 59 y/o M with history of end stage liver disease, alcohol abuse, esophageal varices, atrial fibrillation, COPD who is admitted to the ICU with possible GIB, septic shock secondary to bacteremia, acute renal failure. Pharmacy has been consulted to assist with electrolyte repletion.   Goal of Therapy:  Potassium 4.0 - 5.1 mmol/L Magnesium 2.0 - 2.4 mg/dL All other electrolytes WNL   Plan:   Replace magnesium with 2 grams IV magnesium sulfate  No other replacement indicated today  F/u labs in am 6/13 and replace as needed  Lowella Bandy, PharmD Clinical Pharmacist 12/08/2019 7:24 AM

## 2019-12-08 NOTE — Progress Notes (Signed)
Patient slept for majority of shift.  He was given 10mg  Oxy for pain in the beginning of shift and then delivered 05.mg dilaudid for pain per the attending physician.  Patient still receiving Neo @ , Amiodarone @ 60mg /hr, and Octreotide at 67mcg/hr. His saturations on 4L [place] have maintained around 94%.  His pressur ein the late of shift has dropped in MAP from 61-68mmHg to 45m with a verbal order from physician to not increase Neo drip.  Family called on phone to inquired about patient status.  Explained the prognosis of patient and the need for comfort measures.  Patient was bathed today.  His bedding was replaced.  He had 1 B/M large.  He did not eat his meals.  Patient remains sleeping.

## 2019-12-08 NOTE — Progress Notes (Signed)
Patient has had no urine output this shift. [name], NP notified and aware. No new orders at this time. Will continue to monitor.  Carmel Sacramento, RN

## 2019-12-08 NOTE — Progress Notes (Signed)
PROGRESS NOTES  Discussed with the patient who was lucid and conversational at the time   He said to me "I'm ready to rest, I'm hurting everywhere." He again declines the use dialysis in his care plan I asked if he would like to transition over to more comfort driven goals and he said yes.   He was aware of person place and time.  Reports to following questions, [name] is president, June is month, Jun 28, 2061 is DOB, and he had a Bertram [place] terrier for a pet.  He understood that working to improve his discomfort may negatively impact his blood pressure  //Larry Andrade

## 2019-12-08 NOTE — Progress Notes (Signed)
[place] [facility] [place], [place] 12/08/19  Subjective:   Hospital day # 6 Patient remains critically ill cvs: Requiring pressors, phenylephrine, amiodarone infusion pulm: Some cough, no shortness of breath.  Requiring oxygen supplementation gi:  Iv octreotide infusion Renal: No urine output Hospital course complicated by active GI bleed, septic shock, MRSA bacteremia, strep, jaundice, hypotension requiring pressors  Objective:  Vital signs in last 24 hours:  Temp:  [97.4 F (36.3 C)-98.9 F (37.2 C)] 97.7 F (36.5 C) (06/12 0800) Pulse Rate:  [63-86] 77 (06/12 0900) Resp:  [8-22] 13 (06/12 0900) BP: (79-106)/(46-67) 87/46 (06/12 0900) SpO2:  [83 %-97 %] 90 % (06/12 0900) Weight:  [120 kg] 120 kg (06/12 0500)  Weight change: 6 kg Filed Weights   12/06/19 0500 12/07/19 0345 12/08/19 0500  Weight: 112.1 kg 114 kg 120 kg    Intake/Output:    Intake/Output Summary (Last 24 hours) at 12/08/2019 0941 Last data filed at 12/08/2019 0800 Gross per 24 hour  Intake 4987.16 ml  Output --  Net 4987.16 ml     Physical Exam: General:  Critically ill-appearing, laying in the  HEENT  somewhat dry oral mucous membranes  Pulm/lungs  oxygen supplementation by nasal Cannula, 4 L/min  CVS/Heart  irregular, systolic murmur present  Abdomen:   Distended, umbilical hernia, ascites, scrotal edema  Extremities:  3+ edema up to thighs and lower abdomen  Neurologic:  Alert, able to answer questions  Skin:  Warm  Access:    Condom catheter in place    Basic Metabolic Panel:  Recent Labs  Lab 12/04/19 0547 12/04/19 0547 12/05/19 0339 12/05/19 0339 12/06/19 0440 12/07/19 0025 12/08/19 0436  NA 132*  --  132*  --  132* 131* 130*  K 4.0  --  3.8  --  3.9 4.1 4.3  CL 96*  --  96*  --  98 97* 97*  CO2 22  --  21*  --  18* 18* 16*  GLUCOSE 113*  --  128*  --  96 83 97  BUN 69*  --  75*  --  79* 76* 77*  CREATININE 2.72*  --  3.22*  --  3.63* 3.71* 4.42*  CALCIUM 7.0*    < > 7.2*   < > 7.4* 7.5* 7.5*  MG 1.7  --  2.0  --  1.8 1.7 1.8  PHOS 3.8  --  4.5  --  4.7* 5.3* 6.6*   < > = values in this interval not displayed.     CBC: Recent Labs  Lab 12/04/19 0547 12/04/19 1920 12/05/19 0339 12/05/19 2020 12/06/19 0440 12/07/19 0025 12/08/19 0436  WBC 19.6*  --  23.6*  --  30.1* 27.7* 25.0*  HGB 7.1*   < > 7.8* 7.8* 7.8* 7.7* 7.3*  HCT 20.8*   < > 23.4* 22.5* 22.0* 22.0* 22.1*  MCV 103.5*  --  103.1*  --  100.0 100.0 102.8*  PLT 93*  --  76*  --  88* 96* 93*   < > = values in this interval not displayed.      Lab Results  Component Value Date   HEPBSAG NON REACTIVE 11/20/2019   HEPBIGM NON REACTIVE 11/20/2019      Microbiology:  Recent Results (from the past 240 hour(s))  Culture, blood (routine x 2)     Status: Abnormal   Collection Time: 12/04/2019  2:22 PM   Specimen: BLOOD  Result Value Ref Range Status   Specimen Description   Final  BLOOD LEFT ANTECUBITAL Performed at [facility], [address] [place], [place] [address]    Special Requests   Final    BOTTLES DRAWN AEROBIC AND ANAEROBIC Blood Culture adequate volume Performed at [facility], [address] [place], [place] [address]    Culture  Setup Time   Final    Organism ID to follow GRAM POSITIVE COCCI IN BOTH AEROBIC AND ANAEROBIC BOTTLES CRITICAL RESULT CALLED TO, READ BACK BY AND VERIFIED WITH: DAVID BESANTI 12/03/19 AT 0105 HS Performed at [facility], [address] [place], [place] [address]    Culture (A)  Final    METHICILLIN RESISTANT STAPHYLOCOCCUS AUREUS STREPTOCOCCUS PNEUMONIAE nonviable    Report Status 12/05/2019 FINAL  Final   Organism ID, Bacteria METHICILLIN RESISTANT STAPHYLOCOCCUS AUREUS  Final      Susceptibility   Methicillin resistant staphylococcus aureus - MIC*    CIPROFLOXACIN 1 SENSITIVE Sensitive     ERYTHROMYCIN RESISTANT Resistant     GENTAMICIN <=0.5 SENSITIVE Sensitive     OXACILLIN >=4 RESISTANT  Resistant     TETRACYCLINE <=1 SENSITIVE Sensitive     VANCOMYCIN 1 SENSITIVE Sensitive     TRIMETH/SULFA <=10 SENSITIVE Sensitive     CLINDAMYCIN RESISTANT Resistant     RIFAMPIN <=0.5 SENSITIVE Sensitive     Inducible Clindamycin POSITIVE Resistant     * METHICILLIN RESISTANT STAPHYLOCOCCUS AUREUS  Blood Culture ID Panel (Reflexed)     Status: Abnormal   Collection Time: 11/30/2019  2:22 PM  Result Value Ref Range Status   Enterococcus species NOT DETECTED NOT DETECTED Final   Listeria monocytogenes NOT DETECTED NOT DETECTED Final   Staphylococcus species DETECTED (A) NOT DETECTED Final    Comment: CRITICAL RESULT CALLED TO, READ BACK BY AND VERIFIED WITH: DAVID BESANTI 12/03/19 AT 0105 HS    Staphylococcus aureus (BCID) DETECTED (A) NOT DETECTED Final    Comment: Methicillin (oxacillin)-resistant Staphylococcus aureus (MRSA). MRSA is predictably resistant to beta-lactam antibiotics (except ceftaroline). Preferred therapy is vancomycin unless clinically contraindicated. Patient requires contact precautions if  hospitalized. CRITICAL RESULT CALLED TO, READ BACK BY AND VERIFIED WITH: DAVID BESANTI 12/03/19 AT 0105 HS    Methicillin resistance DETECTED (A) NOT DETECTED Final    Comment: CRITICAL RESULT CALLED TO, READ BACK BY AND VERIFIED WITH: DAVID BESANTI 12/03/19 AT 0105 HS    Streptococcus species DETECTED (A) NOT DETECTED Final    Comment: CRITICAL RESULT CALLED TO, READ BACK BY AND VERIFIED WITH: DAVID BESANTI 12/03/19 AT 0105 HS    Streptococcus agalactiae NOT DETECTED NOT DETECTED Final   Streptococcus pneumoniae DETECTED (A) NOT DETECTED Final    Comment: CRITICAL RESULT CALLED TO, READ BACK BY AND VERIFIED WITH: DAVID BESANTI 12/03/19 AT 0105 HS    Streptococcus pyogenes NOT DETECTED NOT DETECTED Final   Acinetobacter baumannii NOT DETECTED NOT DETECTED Final   Enterobacteriaceae species NOT DETECTED NOT DETECTED Final   Enterobacter cloacae complex NOT DETECTED NOT DETECTED  Final   Escherichia coli NOT DETECTED NOT DETECTED Final   Klebsiella oxytoca NOT DETECTED NOT DETECTED Final   Klebsiella pneumoniae NOT DETECTED NOT DETECTED Final   Proteus species NOT DETECTED NOT DETECTED Final   Serratia marcescens NOT DETECTED NOT DETECTED Final   Haemophilus influenzae NOT DETECTED NOT DETECTED Final   Neisseria meningitidis NOT DETECTED NOT DETECTED Final   Pseudomonas aeruginosa NOT DETECTED NOT DETECTED Final   Candida albicans NOT DETECTED NOT DETECTED Final   Candida glabrata NOT DETECTED NOT DETECTED Final  Candida krusei NOT DETECTED NOT DETECTED Final   Candida parapsilosis NOT DETECTED NOT DETECTED Final   Candida tropicalis NOT DETECTED NOT DETECTED Final    Comment: Performed at [facility], [address] [place], [place] [address]  Culture, blood (routine x 2)     Status: Abnormal   Collection Time: January 01, 2020  3:46 PM   Specimen: BLOOD  Result Value Ref Range Status   Specimen Description   Final    BLOOD LEFT ANTECUBITAL Performed at [facility], [address] [place], [place] [address]    Special Requests   Final    BOTTLES DRAWN AEROBIC AND ANAEROBIC Blood Culture results may not be optimal due to an excessive volume of blood received in culture bottles Performed at [facility], [address] [place], [place] [address]    Culture  Setup Time   Final    GRAM POSITIVE COCCI IN BOTH AEROBIC AND ANAEROBIC BOTTLES CRITICAL VALUE NOTED.  VALUE IS CONSISTENT WITH PREVIOUSLY REPORTED AND CALLED VALUE. Performed at [facility], [address] [place], [place] [address]    Culture STAPHYLOCOCCUS AUREUS (A)  Final   Report Status 12/05/2019 FINAL  Final  SARS Coronavirus 2 by RT PCR (hospital order, performed in [facility] hospital lab) Nasopharyngeal Nasopharyngeal Swab     Status: None   Collection Time: 01-01-2020  3:46 PM   Specimen: Nasopharyngeal Swab  Result Value Ref Range Status   SARS  Coronavirus 2 NEGATIVE NEGATIVE Final    Comment: (NOTE) SARS-CoV-2 target nucleic acids are NOT DETECTED. The SARS-CoV-2 RNA is generally detectable in upper and lower respiratory specimens during the acute phase of infection. The lowest concentration of SARS-CoV-2 viral copies this assay can detect is 250 copies / mL. A negative result does not preclude SARS-CoV-2 infection and should not be used as the sole basis for treatment or other patient management decisions.  A negative result may occur with improper specimen collection / handling, submission of specimen other than nasopharyngeal swab, presence of viral mutation(s) within the areas targeted by this assay, and inadequate number of viral copies (<250 copies / mL). A negative result must be combined with clinical observations, patient history, and epidemiological information. Fact Sheet for Patients:   BoilerBrush.com.cy Fact Sheet for Healthcare Providers: https://pope.com/ This test is not yet approved or cleared  by the [place] FDA and has been authorized for detection and/or diagnosis of SARS-CoV-2 by FDA under an Emergency Use Authorization (EUA).  This EUA will remain in effect (meaning this test can be used) for the duration of the COVID-19 declaration under Section 564(b)(1) of the Act, 21 U.S.C. section 360bbb-3(b)(1), unless the authorization is terminated or revoked sooner. Performed at [facility], [address] [place], [place] [address]   Urine culture     Status: None   Collection Time: 12/03/19  8:19 PM   Specimen: Urine, Random  Result Value Ref Range Status   Specimen Description   Final    URINE, RANDOM Performed at [facility], [address] [place], [place] [address]    Special Requests   Final    NONE Performed at [facility], [address], [place], [place] [address]    Culture   Final    NO  GROWTH Performed at [facility], [address], [place], [place] [address]    Report Status 12/04/2019 FINAL  Final  CULTURE, BLOOD (ROUTINE X 2) w Reflex to ID Panel  Status: None (Preliminary result)   Collection Time: 12/07/19 12:25 AM   Specimen: BLOOD  Result Value Ref Range Status   Specimen Description BLOOD LEFT AC  Final   Special Requests   Final    BOTTLES DRAWN AEROBIC AND ANAEROBIC Blood Culture adequate volume   Culture   Final    NO GROWTH 1 DAY Performed at [facility], [address], [place], [place] [address]    Report Status PENDING  Incomplete  CULTURE, BLOOD (ROUTINE X 2) w Reflex to ID Panel     Status: None (Preliminary result)   Collection Time: 12/07/19 12:35 AM   Specimen: BLOOD  Result Value Ref Range Status   Specimen Description BLOOD LEFT HAND  Final   Special Requests   Final    BOTTLES DRAWN AEROBIC AND ANAEROBIC Blood Culture adequate volume   Culture   Final    NO GROWTH 1 DAY Performed at [facility], [address], [place], [place] [address]    Report Status PENDING  Incomplete    Coagulation Studies: No results for input(s): LABPROT, INR in the last 72 hours.  Urinalysis: No results for input(s): COLORURINE, LABSPEC, PHURINE, GLUCOSEU, HGBUR, BILIRUBINUR, KETONESUR, PROTEINUR, UROBILINOGEN, NITRITE, LEUKOCYTESUR in the last 72 hours.  Invalid input(s): APPERANCEUR    Imaging: No results found.   Medications:   . sodium chloride Stopped (12/03/19 8315)  . sodium chloride    . sodium chloride    . amiodarone 60 mg/hr (12/08/19 0843)  . ceFTAROline (TEFLARO) IV Stopped (12/08/19 0606)  . magnesium sulfate bolus IVPB 2 g (12/08/19 0914)  . octreotide  (SANDOSTATIN)    IV infusion 50 mcg/hr (12/08/19 0800)  . phenylephrine (NEO-SYNEPHRINE) Adult infusion 50 mcg/min (12/08/19 0904)   . sodium chloride   Intravenous Once  . allopurinol  100 mg Oral BID  . folic acid  1 mg Oral Daily  . lactulose   30 g Oral BID  . levalbuterol  0.63 mg Nebulization Q6H  . mouth rinse  15 mL Mouth Rinse BID  . metoprolol succinate  25 mg Oral BID  . midodrine  10 mg Oral TID WC  . nicotine  21 mg Transdermal Daily  . pantoprazole  40 mg Intravenous Q12H  . thiamine  100 mg Oral Daily   sodium chloride, docusate sodium, guaiFENesin, levalbuterol, morphine injection, ondansetron (ZOFRAN) IV, oxyCODONE, polyethylene glycol, promethazine  Assessment/ Plan:  59 y.o. male with cirrhosis, congestive heart failure, gout, GERD, hypertension, pancreatitis,alcohol abuse admitted on Dec 07, 2019 for Hepatic encephalopathy syndrome ([place]) [K72.90] Sepsis, due to unspecified organism, unspecified whether acute organ dysfunction present ([place]) [A41.9]  # ARF Baseline creatinine of 1.14 from Nov 19, 2019.  GFR greater than 60 Acute renal failure is likely secondary to ATN.  Patient's creatinine is worsening.  Urine output is minimal. Due to underlying multiple pulmonary disease and decompensated cirrhosis, patient would not make a good long-term or short - term dialysis candidate. -Consideration for hospice is more appropriate   #Metabolic acidosis Likely due to renal failure can consider adding sodium bicarbonate tablets 1300 mg twice a day  #Alcoholic Cirrhosis with Hyponatremia and anasarca Decompensated cirrhosis with low albumin of 2.0 Patient reports drinking vodka and white liquor at home until recently Currently has anasarca Maintained on midodrine and phenylephrine for hypotension Lactulose, MiraLAX, Colace UOP is poor. Can consider lasix infusion but it is likely going to be ineffective because of ATN  #Sepsis MRSA bacteremia requiring ceftaroline (TEFLARO) administration.  Dosed by  pharmacy     LOS: 6 [place] Thedore Mins 6/12/20219:41 AM  [facility] [place], [place] [phone]  Note: This note was prepared with Dragon dictation. Any transcription errors are  unintentional

## 2019-12-09 LAB — CBC
HCT: 22.4 % — ABNORMAL LOW (ref 39.0–52.0)
Hemoglobin: 7.2 g/dL — ABNORMAL LOW (ref 13.0–17.0)
MCH: 34.4 pg — ABNORMAL HIGH (ref 26.0–34.0)
MCHC: 32.1 g/dL (ref 30.0–36.0)
MCV: 107.2 fL — ABNORMAL HIGH (ref 80.0–100.0)
Platelets: 72 10*3/uL — ABNORMAL LOW (ref 150–400)
RBC: 2.09 MIL/uL — ABNORMAL LOW (ref 4.22–5.81)
RDW: 23.6 % — ABNORMAL HIGH (ref 11.5–15.5)
WBC: 27.5 10*3/uL — ABNORMAL HIGH (ref 4.0–10.5)
nRBC: 0.4 % — ABNORMAL HIGH (ref 0.0–0.2)

## 2019-12-09 LAB — COMPREHENSIVE METABOLIC PANEL
ALT: 30 U/L (ref 0–44)
AST: 64 U/L — ABNORMAL HIGH (ref 15–41)
Albumin: 1.9 g/dL — ABNORMAL LOW (ref 3.5–5.0)
Alkaline Phosphatase: 236 U/L — ABNORMAL HIGH (ref 38–126)
Anion gap: 19 — ABNORMAL HIGH (ref 5–15)
BUN: 84 mg/dL — ABNORMAL HIGH (ref 6–20)
CO2: 12 mmol/L — ABNORMAL LOW (ref 22–32)
Calcium: 7.6 mg/dL — ABNORMAL LOW (ref 8.9–10.3)
Chloride: 99 mmol/L (ref 98–111)
Creatinine, Ser: 5.08 mg/dL — ABNORMAL HIGH (ref 0.61–1.24)
GFR calc Af Amer: 14 mL/min — ABNORMAL LOW (ref >60)
GFR calc non Af Amer: 12 mL/min — ABNORMAL LOW (ref >60)
Glucose, Bld: 61 mg/dL — ABNORMAL LOW (ref 70–99)
Potassium: 4.8 mmol/L (ref 3.5–5.1)
Sodium: 130 mmol/L — ABNORMAL LOW (ref 135–145)
Total Bilirubin: 4.7 mg/dL — ABNORMAL HIGH (ref 0.3–1.2)
Total Protein: 5.6 g/dL — ABNORMAL LOW (ref 6.5–8.1)

## 2019-12-09 LAB — MAGNESIUM: Magnesium: 2.2 mg/dL (ref 1.7–2.4)

## 2019-12-09 LAB — PHOSPHORUS: Phosphorus: 7.9 mg/dL — ABNORMAL HIGH (ref 2.5–4.6)

## 2019-12-09 MED ORDER — MORPHINE SULFATE (PF) 2 MG/ML IV SOLN
1.0000 mg | INTRAVENOUS | Status: DC | PRN
  Administered 2019-12-09: 2 mg via INTRAVENOUS
  Filled 2019-12-09: qty 1

## 2019-12-09 MED ORDER — LORAZEPAM 2 MG/ML IJ SOLN: 1.0000 mg | INTRAMUSCULAR | Status: DC | PRN

## 2019-12-09 MED ORDER — HYDROMORPHONE HCL 1 MG/ML IJ SOLN
0.5000 mg | INTRAMUSCULAR | Status: DC | PRN
  Administered 2019-12-09 (×3): 0.5 mg via INTRAVENOUS
  Filled 2019-12-09 (×4): qty 1

## 2019-12-12 LAB — CULTURE, BLOOD (ROUTINE X 2)
Culture: NO GROWTH
Culture: NO GROWTH
Special Requests: ADEQUATE
Special Requests: ADEQUATE

## 2019-12-27 NOTE — Progress Notes (Signed)
PCCM - ACP  The patient yesterday vocalized to myself, nurse, and NP, that he wished to stop hurting and he was done fighting. I spoke with HCPOA this morning and she concurs that it is time to move toward comfort care.   //Oriah Leinweber

## 2019-12-27 NOTE — Progress Notes (Signed)
Patient complaining back hurts. BP been in 80s systolically throughout shift even on Neo IV. Gave PRN morphine 1mg , which helped calm the patient down for a short time. Now BP 78/47 with MAP 57. Patient said to RN, "Please let me die." ICU NP notified. Will continue to monitor.

## 2019-12-27 NOTE — Progress Notes (Signed)
Pt transferred off the floor at 2020.  [employer]

## 2019-12-27 NOTE — Progress Notes (Signed)
Patient transferred to room 108.  Called and updated RN with report and post-mortem CDS reference number.

## 2019-12-27 NOTE — Progress Notes (Signed)
8:45am - [place] visited pt. per [facility] Terrell's referral; [place] paged early this AM and informed by ICU staff that pt. has been saying he is ready to die.  [place] spoke w/pt.'s RN; pt.'s family are on  the way. CH will follow up when family arrives.  12pm - [place] visited pt.'s rm. and found pt.'s S.O. Elmyra Ricks, stepson's fiance Jade at bedside.  Pt. drowsy and moving intermittently, mumbling; says he is not in pain currently.  Both Elmyra Ricks and [place] tearful; Elmyra Ricks said pt.'s stepson Landry Mellow is downstairs getting his brother to come up to the room.  [place] visited w/family members in rm. until additional family arrived.  Elmyra Ricks talked about Pt.'s enjoyment of fishing and said this was a connection point for them as she also loves to fish.  Jade tearfully shared that she feels close to pt. even though they only met relatively recently.  When [place] and other stepson arrived, [facility] gathered family around bed for prayer.  Pt.'s blood pressure meds (per RN) ran out just before prayer.  Family members tearful after prayer, and [place] left them to spend time together and with Pt.  CH remains available to family today as they pursue comfort care for pt.      12-22-19 1200  Clinical Encounter Type  Visited With Patient and family together;Health care provider  Visit Type Follow-up;Psychological support;Spiritual support;Social support;Critical Care;Patient actively dying  Referral From Chaplain;Nurse  Spiritual Encounters  Spiritual Needs Ritual;Grief support;Emotional  Stress Factors  Patient Stress Factors Health changes;Major life changes;Loss of control;Exhausted  Family Stress Factors Loss of control;Loss;Major life changes;Health changes  Advance Directives (For Healthcare)  Does Patient Have a Medical Advance Directive? Yes

## 2019-12-27 NOTE — Progress Notes (Signed)
Pt continues to decline, as pt with increasing pain and worsening hypotension.  Yesterday pt (while lucid and alert) in discussion with Dr. Earlie Server,  decided to focus more on comfort driven goals.  Plan was not to escalate care beyond current measures (not to increase Neo-synephrine beyond 75 mcg).  Pt is DNR/DNI, and refusing hemodialysis  Pt is currently saying "I'm ready to die, and I don't want to hurt anymore."  Have attempted to contact pt's significant other Jeannie Done (POA) to discuss the pt's declining status,  that pt is currently suffering and in the dying process, and current treatment measures are only prolonging pt's suffering.    Was unable to reach her, voice message left for her to call back.      Harlon Ditty, AGACNP-BC [facility] Pager: [phone]

## 2019-12-27 NOTE — Death Summary Note (Signed)
DEATH SUMMARY   Patient Details  Name: Larry Andrade. MRN: [id number] DOB: 06-May-1961  Admission/Discharge Information   Admit Date:  December 24, 2019  Date of Death: Date of Death: 12/31/19  Time of Death: Time of Death: Nov 17, 1830  Length of Stay: 7  Referring Physician: Gildardo Pounds, PA   Reason(s) for Hospitalization  MRSA and STREP PNEUNMONIA SEPTIC SHOCK  Diagnoses  Preliminary cause of death: LIVER CIRRHOSIS, MRSA PNEUMONIA AND STEP PNEUMONIA Secondary Diagnoses (including complications and co-morbidities):  Active Problems:   Hepatic encephalopathy syndrome (HCC)   Acute respiratory failure (HCC)   Acute renal failure (HCC)   Goals of care, counseling/discussion   Palliative care by specialist   DNR (do not resuscitate)   Brief Hospital Course (including significant findings, care, treatment, and services provided and events leading to death)    59 y.o.malewith medical history significant of alcohol abuse, alcoholic cirrhosis obesity esophageal varices, hypertension, COPD, GERD, gout, pancreatitis, atrial fibrillation on anticoagulants, CKD stage III, thrombocytopenia, tobacco abuse, who presents with hematemesis. Patient has been having recurrenthematemesisbut was not vomiting blood around mouth and chin. Patient has abdominal distention and skin jaundice.active smoking appx 1 pack daily. He is jaundiced and in anasarca. Critical care admission is requested due to encephalopathy, anasarca and possible GIB.    Lines / Drains: PIVx2  Cultures / Sepsis markers: Blood and urine  Antibiotics: Rocephin   Protocols / Consultants: GI, PCCM  6/6 admitted for active GIB from liver cirrhosis with encephalopathy, septic shock 6/7 MRSA bacteremia and STEP PNEUMONIA bacteramia 6/8 active bleeding, worsening jaundice and renal failure 6/9 active bleeding, shock, on pressors 6/10 active bleeding, shock, on pressors    The patient yesterday  vocalized toMD, nurse, and NP, that he wished to stop hurting and he was done fighting. I spoke with HCPOA this morning and she concurs that it is time to move toward comfort care.    Pt continues to decline, as pt with increasing pain and worsening hypotension.  Yesterday pt (while lucid and alert) in discussion with Dr. Earlie Server,  decided to focus more on comfort driven goals.  Plan was not to escalate care beyond current measures (not to increase Neo-synephrine beyond 75 mcg).  Pt is DNR/DNI, and refusing hemodialysis  Pt is currently saying "I'm ready to die, and I don't want to hurt anymore."  Have attempted to contact pt's significant other Jeannie Done (POA) to discuss the pt's declining status,  that pt is currently suffering and in the dying process, and current treatment measures are only prolonging pt's suffering.       Pertinent Labs and Studies  Significant Diagnostic Studies DG Chest Port 1 View  Result Date: 12/04/2019 CLINICAL DATA:  Acute respiratory failure EXAM: PORTABLE CHEST 1 VIEW COMPARISON:  Two days ago FINDINGS: Patchy bilateral infiltrate which has mildly worsened. Lung volumes are low. Cardiomegaly. No visible effusion or pneumothorax IMPRESSION: 1. Patchy infiltrates with mild worsening from 2 days ago. 2. Cardiomegaly. Electronically Signed   By: Marnee Spring M.D.   On: 12/04/2019 07:34   DG Chest Portable 1 View  Result Date: 2019-12-24 CLINICAL DATA:  Increased jaundice, weakness, edema; history CHF, cirrhosis, hypertension, smoker, gout EXAM: PORTABLE CHEST 1 VIEW COMPARISON:  Portable exam 1442 hours compared to 10/06/2019 FINDINGS: Enlargement of cardiac silhouette. Mediastinal contours normal. Atherosclerotic calcification aorta. BILATERAL pulmonary infiltrates new since previous exam, could represent pulmonary edema or atypical infection. Subsegmental atelectasis LEFT base. No pleural effusion or pneumothorax. Bones demineralized. IMPRESSION: Enlargement of  cardiac silhouette. New BILATERAL pulmonary infiltrates question pulmonary edema versus atypical infection. Electronically Signed   By: Ulyses Southward M.D.   On: 12/21/19 15:07   ECHOCARDIOGRAM COMPLETE  Result Date: 12/04/2019    ECHOCARDIOGRAM REPORT   Patient Name:   Larry Andrade. Date of Exam: 12/03/2019 Medical Rec #:  [id number]       Height:       74.0 in Accession #:    [id number]      Weight:       213.4 lb Date of Birth:  1960/09/19       BSA:          2.234 m Patient Age:    58 years        BP:           96/66 mmHg Patient Gender: M               HR:           139 bpm. Exam Location:  [facility] Procedure: 2D Echo, Cardiac Doppler and Color Doppler Indications:     Bacteremia 790.7  History:         Patient has prior history of Echocardiogram examinations. CHF;                  Risk Factors:Hypertension.  Sonographer:     Neysa Bonito Roar Referring Phys:  [id number] Judithe Modest Diagnosing Phys: Yvonne Kendall MD IMPRESSIONS  1. Left ventricular ejection fraction, by estimation, is 55 to 60%. The left ventricle has normal function. The left ventricle has no regional wall motion abnormalities. There is mild left ventricular hypertrophy. Left ventricular diastolic parameters are indeterminate.  2. Right ventricular systolic function is mildly reduced. The right ventricular size is normal. Mildly increased right ventricular wall thickness. There is mildly elevated pulmonary artery systolic pressure. The estimated right ventricular systolic pressure is 39.0 mmHg.  3. Left atrial size was moderately dilated.  4. Right atrial size was mildly dilated.  5. The mitral valve is grossly normal. Mild mitral valve regurgitation.  6. The aortic valve was not well visualized. Aortic valve regurgitation is not visualized. Mild aortic valve sclerosis is present, with no evidence of aortic valve stenosis.  7. The inferior vena cava is dilated in size with <50% respiratory variability, suggesting right atrial pressure of 15 mmHg.  FINDINGS  Left Ventricle: Left ventricular ejection fraction, by estimation, is 55 to 60%. The left ventricle has normal function. The left ventricle has no regional wall motion abnormalities. The left ventricular internal cavity size was normal in size. There is  mild left ventricular hypertrophy. Left ventricular diastolic parameters are indeterminate. Right Ventricle: The right ventricular size is normal. Mildly increased right ventricular wall thickness. Right ventricular systolic function is mildly reduced. There is mildly elevated pulmonary artery systolic pressure. The tricuspid regurgitant velocity is 2.45 m/s, and with an assumed right atrial pressure of 15 mmHg, the estimated right ventricular systolic pressure is 39.0 mmHg. Left Atrium: Left atrial size was moderately dilated. Right Atrium: Right atrial size was mildly dilated. Pericardium: Trivial pericardial effusion is present. Mitral Valve: The mitral valve is grossly normal. Mild mitral annular calcification. Mild mitral valve regurgitation. Tricuspid Valve: The tricuspid valve is normal in structure. Tricuspid valve regurgitation is mild. Aortic Valve: The aortic valve was not well visualized. . There is mild thickening of the aortic valve. Aortic valve regurgitation is not visualized. Mild aortic valve sclerosis is present, with no evidence of aortic valve stenosis.  There is mild thickening of the aortic valve. Aortic valve mean gradient measures 9.0 mmHg. Aortic valve peak gradient measures 14.1 mmHg. Aortic valve area, by VTI measures 1.46 cm. Pulmonic Valve: The pulmonic valve was not well visualized. Pulmonic valve regurgitation is trivial. No evidence of pulmonic stenosis. Aorta: The aortic root is normal in size and structure. Pulmonary Artery: The pulmonary artery is not well seen. Venous: The inferior vena cava is dilated in size with less than 50% respiratory variability, suggesting right atrial pressure of 15 mmHg. IAS/Shunts: The  interatrial septum was not well visualized.  LEFT VENTRICLE PLAX 2D LVIDd:         4.87 cm  Diastology LVIDs:         3.65 cm  LV e' lateral:   8.81 cm/s LV PW:         1.21 cm  LV E/e' lateral: 16.3 LV IVS:        1.29 cm  LV e' medial:    7.51 cm/s LVOT diam:     1.80 cm  LV E/e' medial:  19.2 LV SV:         44 LV SV Index:   20 LVOT Area:     2.54 cm  RIGHT VENTRICLE RV Mid diam:    3.53 cm RV S prime:     13.20 cm/s TAPSE (M-mode): 1.8 cm LEFT ATRIUM              Index       RIGHT ATRIUM           Index LA diam:        4.50 cm  2.01 cm/m  RA Area:     20.00 cm LA Vol (A2C):   118.0 ml 52.81 ml/m RA Volume:   54.10 ml  24.21 ml/m LA Vol (A4C):   92.3 ml  41.31 ml/m LA Biplane Vol: 106.0 ml 47.44 ml/m  AORTIC VALVE                    PULMONIC VALVE AV Area (Vmax):    1.53 cm     PV Vmax:        1.00 m/s AV Area (Vmean):   1.35 cm     PV Peak grad:   4.0 mmHg AV Area (VTI):     1.46 cm     RVOT Peak grad: 2 mmHg AV Vmax:           188.00 cm/s AV Vmean:          138.000 cm/s AV VTI:            0.303 m AV Peak Grad:      14.1 mmHg AV Mean Grad:      9.0 mmHg LVOT Vmax:         113.00 cm/s LVOT Vmean:        73.000 cm/s LVOT VTI:          0.174 m LVOT/AV VTI ratio: 0.57  AORTA Ao Root diam: 3.10 cm MITRAL VALVE                TRICUSPID VALVE MV Area (PHT): 4.41 cm     TR Peak grad:   24.0 mmHg MV Decel Time: 172 msec     TR Vmax:        245.00 cm/s MV E velocity: 144.00 cm/s  SHUNTS                             Systemic VTI:  0.17 m                             Systemic Diam: 1.80 cm Yvonne Kendallhristopher End MD Electronically signed by Yvonne Kendallhristopher End MD Signature Date/Time: 12/04/2019/7:05:58 AM    Final     Microbiology Recent Results (from the past 240 hour(s))  Culture, blood (routine x 2)     Status: Abnormal   Collection Time: 08-18-2019  2:22 PM   Specimen: BLOOD  Result Value Ref Range Status   Specimen Description   Final    BLOOD LEFT ANTECUBITAL Performed at [facility], [address] [place], [place] [address]    Special Requests   Final    BOTTLES DRAWN AEROBIC AND ANAEROBIC Blood Culture adequate volume Performed at [facility], [address] [place], [place] [address]    Culture  Setup Time   Final    Organism ID to follow GRAM POSITIVE COCCI IN BOTH AEROBIC AND ANAEROBIC BOTTLES CRITICAL RESULT CALLED TO, READ BACK BY AND VERIFIED WITH: DAVID BESANTI 12/03/19 AT 0105 HS Performed at [facility], [address] [place], [place] [address]    Culture (A)  Final    METHICILLIN RESISTANT STAPHYLOCOCCUS AUREUS STREPTOCOCCUS PNEUMONIAE nonviable    Report Status 12/05/2019 FINAL  Final   Organism ID, Bacteria METHICILLIN RESISTANT STAPHYLOCOCCUS AUREUS  Final      Susceptibility   Methicillin resistant staphylococcus aureus - MIC*    CIPROFLOXACIN 1 SENSITIVE Sensitive     ERYTHROMYCIN RESISTANT Resistant     GENTAMICIN <=0.5 SENSITIVE Sensitive     OXACILLIN >=4 RESISTANT Resistant     TETRACYCLINE <=1 SENSITIVE Sensitive     VANCOMYCIN 1 SENSITIVE Sensitive     TRIMETH/SULFA <=10 SENSITIVE Sensitive     CLINDAMYCIN RESISTANT Resistant     RIFAMPIN <=0.5 SENSITIVE Sensitive     Inducible Clindamycin POSITIVE Resistant     * METHICILLIN RESISTANT STAPHYLOCOCCUS AUREUS  Blood Culture ID Panel (Reflexed)     Status: Abnormal   Collection Time: 08-18-2019  2:22 PM  Result Value Ref Range Status   Enterococcus species NOT DETECTED NOT DETECTED Final   Listeria monocytogenes NOT DETECTED NOT DETECTED Final   Staphylococcus species DETECTED (A) NOT DETECTED Final    Comment: CRITICAL RESULT CALLED TO, READ BACK BY AND VERIFIED WITH: DAVID BESANTI 12/03/19 AT 0105 HS    Staphylococcus aureus (BCID) DETECTED (A) NOT DETECTED Final    Comment: Methicillin (oxacillin)-resistant Staphylococcus aureus (MRSA). MRSA is predictably resistant to beta-lactam antibiotics (except ceftaroline). Preferred therapy is vancomycin  unless clinically contraindicated. Patient requires contact precautions if  hospitalized. CRITICAL RESULT CALLED TO, READ BACK BY AND VERIFIED WITH: DAVID BESANTI 12/03/19 AT 0105 HS    Methicillin resistance DETECTED (A) NOT DETECTED Final    Comment: CRITICAL RESULT CALLED TO, READ BACK BY AND VERIFIED WITH: DAVID BESANTI 12/03/19 AT 0105 HS    Streptococcus species DETECTED (A) NOT DETECTED Final    Comment: CRITICAL RESULT CALLED TO, READ BACK BY AND VERIFIED WITH: DAVID BESANTI 12/03/19 AT 0105 HS    Streptococcus agalactiae NOT DETECTED NOT DETECTED Final   Streptococcus pneumoniae DETECTED (A) NOT DETECTED Final    Comment: CRITICAL RESULT CALLED TO, READ BACK BY AND VERIFIED WITH:  DAVID BESANTI 12/03/19 AT 0105 HS    Streptococcus pyogenes NOT DETECTED NOT DETECTED Final   Acinetobacter baumannii NOT DETECTED NOT DETECTED Final   Enterobacteriaceae species NOT DETECTED NOT DETECTED Final   Enterobacter cloacae complex NOT DETECTED NOT DETECTED Final   Escherichia coli NOT DETECTED NOT DETECTED Final   Klebsiella oxytoca NOT DETECTED NOT DETECTED Final   Klebsiella pneumoniae NOT DETECTED NOT DETECTED Final   Proteus species NOT DETECTED NOT DETECTED Final   Serratia marcescens NOT DETECTED NOT DETECTED Final   Haemophilus influenzae NOT DETECTED NOT DETECTED Final   Neisseria meningitidis NOT DETECTED NOT DETECTED Final   Pseudomonas aeruginosa NOT DETECTED NOT DETECTED Final   Candida albicans NOT DETECTED NOT DETECTED Final   Candida glabrata NOT DETECTED NOT DETECTED Final   Candida krusei NOT DETECTED NOT DETECTED Final   Candida parapsilosis NOT DETECTED NOT DETECTED Final   Candida tropicalis NOT DETECTED NOT DETECTED Final    Comment: Performed at [facility], [address] [place], [place] [address]  Culture, blood (routine x 2)     Status: Abnormal   Collection Time: 12/11/2019  3:46 PM   Specimen: BLOOD  Result Value Ref Range Status   Specimen Description    Final    BLOOD LEFT ANTECUBITAL Performed at [facility], [address] [place], [place] [address]    Special Requests   Final    BOTTLES DRAWN AEROBIC AND ANAEROBIC Blood Culture results may not be optimal due to an excessive volume of blood received in culture bottles Performed at [facility], [address] [place], [place] [address]    Culture  Setup Time   Final    GRAM POSITIVE COCCI IN BOTH AEROBIC AND ANAEROBIC BOTTLES CRITICAL VALUE NOTED.  VALUE IS CONSISTENT WITH PREVIOUSLY REPORTED AND CALLED VALUE. Performed at [facility], [address] [place], [place] [address]    Culture STAPHYLOCOCCUS AUREUS (A)  Final   Report Status 12/05/2019 FINAL  Final  SARS Coronavirus 2 by RT PCR (hospital order, performed in [facility] hospital lab) Nasopharyngeal Nasopharyngeal Swab     Status: None   Collection Time: 12/06/2019  3:46 PM   Specimen: Nasopharyngeal Swab  Result Value Ref Range Status   SARS Coronavirus 2 NEGATIVE NEGATIVE Final    Comment: (NOTE) SARS-CoV-2 target nucleic acids are NOT DETECTED. The SARS-CoV-2 RNA is generally detectable in upper and lower respiratory specimens during the acute phase of infection. The lowest concentration of SARS-CoV-2 viral copies this assay can detect is 250 copies / mL. A negative result does not preclude SARS-CoV-2 infection and should not be used as the sole basis for treatment or other patient management decisions.  A negative result may occur with improper specimen collection / handling, submission of specimen other than nasopharyngeal swab, presence of viral mutation(s) within the areas targeted by this assay, and inadequate number of viral copies (<250 copies / mL). A negative result must be combined with clinical observations, patient history, and epidemiological information. Fact Sheet for Patients:   StrictlyIdeas.no Fact Sheet for Healthcare  Providers: BankingDealers.co.za This test is not yet approved or cleared  by the [place] FDA and has been authorized for detection and/or diagnosis of SARS-CoV-2 by FDA under an Emergency Use Authorization (EUA).  This EUA will remain in effect (meaning this test can be used) for the duration of the COVID-19 declaration under Section 564(b)(1) of the Act, 21 U.S.C. section 360bbb-3(b)(1), unless the authorization is terminated or  revoked sooner. Performed at [facility], [address] [place], [place] [address]   Urine culture     Status: None   Collection Time: 12/03/19  8:19 PM   Specimen: Urine, Random  Result Value Ref Range Status   Specimen Description   Final    URINE, RANDOM Performed at [facility], [address] [place], [place] [address]    Special Requests   Final    NONE Performed at [facility], [address] [place], [place] [address]    Culture   Final    NO GROWTH Performed at [facility], [address], [place], [place] [address]    Report Status 12/04/2019 FINAL  Final  CULTURE, BLOOD (ROUTINE X 2) w Reflex to ID Panel     Status: None (Preliminary result)   Collection Time: 12/07/19 12:25 AM   Specimen: BLOOD  Result Value Ref Range Status   Specimen Description BLOOD LEFT AC  Final   Special Requests   Final    BOTTLES DRAWN AEROBIC AND ANAEROBIC Blood Culture adequate volume   Culture   Final    NO GROWTH 3 DAYS Performed at [facility], [address] [place], [place] [address]    Report Status PENDING  Incomplete  CULTURE, BLOOD (ROUTINE X 2) w Reflex to ID Panel     Status: None (Preliminary result)   Collection Time: 12/07/19 12:35 AM   Specimen: BLOOD  Result Value Ref Range Status   Specimen Description BLOOD LEFT HAND  Final   Special Requests   Final    BOTTLES DRAWN AEROBIC AND ANAEROBIC Blood Culture adequate volume   Culture   Final    NO GROWTH 3  DAYS Performed at [facility], [address] [place], [place] [address]    Report Status PENDING  Incomplete    Lab Basic Metabolic Panel: Recent Labs  Lab 12/05/19 0339 12/06/19 0440 12/07/19 0025 12/08/19 0436 December 20, 2019 0727  NA 132* 132* 131* 130* 130*  K 3.8 3.9 4.1 4.3 4.8  CL 96* 98 97* 97* 99  CO2 21* 18* 18* 16* 12*  GLUCOSE 128* 96 83 97 61*  BUN 75* 79* 76* 77* 84*  CREATININE 3.22* 3.63* 3.71* 4.42* 5.08*  CALCIUM 7.2* 7.4* 7.5* 7.5* 7.6*  MG 2.0 1.8 1.7 1.8 2.2  PHOS 4.5 4.7* 5.3* 6.6* 7.9*   Liver Function Tests: Recent Labs  Lab 12/05/19 0339 12/06/19 0440 12/07/19 0025 12/08/19 0436 12-20-19 0727  AST 57* 72* 56* 48* 64*  ALT 34 38 36 30 30  ALKPHOS 121 191* 213* 239* 236*  BILITOT 6.1* 5.8* 5.5* 4.9* 4.7*  PROT 5.9* 5.8* 5.8* 5.5* 5.6*  ALBUMIN 2.6* 2.3* 2.2* 2.0* 1.9*   No results for input(s): LIPASE, AMYLASE in the last 168 hours. No results for input(s): AMMONIA in the last 168 hours. CBC: Recent Labs  Lab 12/05/19 0339 12/05/19 0339 12/05/19 2020 12/06/19 0440 12/07/19 0025 12/08/19 0436 12/20/2019 0727  WBC 23.6*  --   --  30.1* 27.7* 25.0* 27.5*  HGB 7.8*   < > 7.8* 7.8* 7.7* 7.3* 7.2*  HCT 23.4*   < > 22.5* 22.0* 22.0* 22.1* 22.4*  MCV 103.1*  --   --  100.0 100.0 102.8* 107.2*  PLT 76*  --   --  88* 96* 93* 72*   < > = values in this interval not displayed.   Cardiac Enzymes: No results for input(s): CKTOTAL, CKMB, CKMBINDEX, TROPONINI  in the last 168 hours. Sepsis Labs: Recent Labs  Lab 12/04/19 0547 12/05/19 0339 12/06/19 0440 12/07/19 0025 12/08/19 0436 December 16, 2019 0727  PROCALCITON 15.52  --   --   --   --   --   WBC 19.6*   < > 30.1* 27.7* 25.0* 27.5*   < > = values in this interval not displayed.      Erin Fulling 12/10/2019, 10:52 AM

## 2019-12-27 NOTE — Progress Notes (Signed)
Pt has red lunch box at bedside, called family [name] stated to [employer] box in trash and she had already got all the belongings from bedside before pt transferred from ICU.

## 2019-12-27 NOTE — Progress Notes (Signed)
CRITICAL CARE NOTE  59 y.o.malewith medical history significant of alcohol abuse, alcoholic cirrhosis obesity esophageal varices, hypertension, COPD, GERD, gout, pancreatitis, atrial fibrillation on anticoagulants, CKD stage III, thrombocytopenia, tobacco abuse, who presents with hematemesis. Patient has been having recurrenthematemesisbut was not vomiting blood around mouth and chin. Patient has abdominal distention and skin jaundice.active smoking appx 1 pack daily. He is jaundiced and in anasarca. Critical care admission is requested due to encephalopathy, anasarca and possible GIB.    Lines / Drains: PIVx2  Cultures / Sepsis markers: Blood and urine  Antibiotics: Rocephin   Protocols / Consultants: GI, PCCM  6/6 admitted for active GIB from liver cirrhosis with encephalopathy, septic shock 6/7 MRSA bacteremia and STEP PNEUMONIA bacteramia 6/8 active bleeding, worsening jaundice and renal failure 6/9 active bleeding, shock, on pressors 6/10 active bleeding, shock, on pressors   CC  follow up shock  HPI Patient remains critically ill Prognosis is guarded Alert and awake On pressors   BP (!) 79/46 (BP Location: Right Arm)    Pulse (!) 57    Temp (!) 97.3 F (36.3 C) (Axillary)    Resp 13    Ht 6\' 2"  (1.88 m)    Wt 121.8 kg    SpO2 92%    BMI 34.48 kg/m    I/O last 3 completed shifts: In: 6139.8 [I.V.:5087.8; IV Piggyback:1052.1] Out: -  No intake/output data recorded.  SpO2: 92 % O2 Flow Rate (L/min): 4 L/min  Estimated body mass index is 34.48 kg/m as calculated from the following:   Height as of this encounter: 6\' 2"  (1.88 m).   Weight as of this encounter: 121.8 kg.     PHYSICAL EXAMINATION:  GENERAL:critically ill appearing, +jaundice HEAD: Normocephalic, atraumatic.  EYES: +scleral icterus.  MOUTH: Moist mucosal membrane. NECK: Supple.  PULMONARY: +rhonchi, +wheezing CARDIOVASCULAR: S1 and S2. IRRR, rate controlled  No murmurs,  rubs, or gallops.  GASTROINTESTINAL: Soft, nontender, +distended.  Positive bowel sounds.   MUSCULOSKELETAL: 2+ edema.  NEUROLOGIC: alert and awake SKIN:intact,warm,dry  MEDICATIONS: I have reviewed all medications and confirmed regimen as documented   CULTURE RESULTS   Recent Results (from the past 240 hour(s))  Culture, blood (routine x 2)     Status: Abnormal   Collection Time: 12/23/2019  2:22 PM   Specimen: BLOOD  Result Value Ref Range Status   Specimen Description   Final    BLOOD LEFT ANTECUBITAL Performed at [facility], [address], [place], [place] [address]    Special Requests   Final    BOTTLES DRAWN AEROBIC AND ANAEROBIC Blood Culture adequate volume Performed at [facility], [address] [place], [place] [address]    Culture  Setup Time   Final    Organism ID to follow [place] AND ANAEROBIC BOTTLES CRITICAL RESULT CALLED TO, READ BACK BY AND VERIFIED WITH: DAVID BESANTI 12/03/19 AT 0105 HS Performed at [facility], [address] [place], [place] [address]    Culture (A)  Final    METHICILLIN RESISTANT STAPHYLOCOCCUS AUREUS STREPTOCOCCUS PNEUMONIAE nonviable    Report Status 12/05/2019 FINAL  Final   Organism ID, Bacteria METHICILLIN RESISTANT STAPHYLOCOCCUS AUREUS  Final      Susceptibility   Methicillin resistant staphylococcus aureus - MIC*    CIPROFLOXACIN 1 SENSITIVE Sensitive     ERYTHROMYCIN RESISTANT Resistant     GENTAMICIN <=0.5 SENSITIVE Sensitive     OXACILLIN >=4 RESISTANT Resistant     TETRACYCLINE <=1 SENSITIVE  Sensitive     VANCOMYCIN 1 SENSITIVE Sensitive     TRIMETH/SULFA <=10 SENSITIVE Sensitive     CLINDAMYCIN RESISTANT Resistant     RIFAMPIN <=0.5 SENSITIVE Sensitive     Inducible Clindamycin POSITIVE Resistant     * METHICILLIN RESISTANT STAPHYLOCOCCUS AUREUS  Blood Culture ID Panel (Reflexed)     Status: Abnormal   Collection Time: 12-20-2019  2:22 PM  Result Value  Ref Range Status   Enterococcus species NOT DETECTED NOT DETECTED Final   Listeria monocytogenes NOT DETECTED NOT DETECTED Final   Staphylococcus species DETECTED (A) NOT DETECTED Final    Comment: CRITICAL RESULT CALLED TO, READ BACK BY AND VERIFIED WITH: DAVID BESANTI 12/03/19 AT 0105 HS    Staphylococcus aureus (BCID) DETECTED (A) NOT DETECTED Final    Comment: Methicillin (oxacillin)-resistant Staphylococcus aureus (MRSA). MRSA is predictably resistant to beta-lactam antibiotics (except ceftaroline). Preferred therapy is vancomycin unless clinically contraindicated. Patient requires contact precautions if  hospitalized. CRITICAL RESULT CALLED TO, READ BACK BY AND VERIFIED WITH: DAVID BESANTI 12/03/19 AT 0105 HS    Methicillin resistance DETECTED (A) NOT DETECTED Final    Comment: CRITICAL RESULT CALLED TO, READ BACK BY AND VERIFIED WITH: DAVID BESANTI 12/03/19 AT 0105 HS    Streptococcus species DETECTED (A) NOT DETECTED Final    Comment: CRITICAL RESULT CALLED TO, READ BACK BY AND VERIFIED WITH: DAVID BESANTI 12/03/19 AT 0105 HS    Streptococcus agalactiae NOT DETECTED NOT DETECTED Final   Streptococcus pneumoniae DETECTED (A) NOT DETECTED Final    Comment: CRITICAL RESULT CALLED TO, READ BACK BY AND VERIFIED WITH: DAVID BESANTI 12/03/19 AT 0105 HS    Streptococcus pyogenes NOT DETECTED NOT DETECTED Final   Acinetobacter baumannii NOT DETECTED NOT DETECTED Final   Enterobacteriaceae species NOT DETECTED NOT DETECTED Final   Enterobacter cloacae complex NOT DETECTED NOT DETECTED Final   Escherichia coli NOT DETECTED NOT DETECTED Final   Klebsiella oxytoca NOT DETECTED NOT DETECTED Final   Klebsiella pneumoniae NOT DETECTED NOT DETECTED Final   Proteus species NOT DETECTED NOT DETECTED Final   Serratia marcescens NOT DETECTED NOT DETECTED Final   Haemophilus influenzae NOT DETECTED NOT DETECTED Final   Neisseria meningitidis NOT DETECTED NOT DETECTED Final   Pseudomonas aeruginosa NOT  DETECTED NOT DETECTED Final   Candida albicans NOT DETECTED NOT DETECTED Final   Candida glabrata NOT DETECTED NOT DETECTED Final   Candida krusei NOT DETECTED NOT DETECTED Final   Candida parapsilosis NOT DETECTED NOT DETECTED Final   Candida tropicalis NOT DETECTED NOT DETECTED Final    Comment: Performed at [facility], [address] [place], [place] [address]  Culture, blood (routine x 2)     Status: Abnormal   Collection Time: 12-20-2019  3:46 PM   Specimen: BLOOD  Result Value Ref Range Status   Specimen Description   Final    BLOOD LEFT ANTECUBITAL Performed at [facility], [address] [place], [place] [address]    Special Requests   Final    BOTTLES DRAWN AEROBIC AND ANAEROBIC Blood Culture results may not be optimal due to an excessive volume of blood received in culture bottles Performed at [facility], [address] [place], [place] [address]    Culture  Setup Time   Final    GRAM POSITIVE COCCI IN BOTH AEROBIC AND ANAEROBIC BOTTLES CRITICAL VALUE NOTED.  VALUE IS CONSISTENT WITH PREVIOUSLY REPORTED AND CALLED VALUE. Performed at [facility], [address], [place], [place]  [address]    Culture STAPHYLOCOCCUS AUREUS (A)  Final   Report Status 12/05/2019 FINAL  Final  SARS Coronavirus 2 by RT PCR (hospital order, performed in [facility] hospital lab) Nasopharyngeal Nasopharyngeal Swab     Status: None   Collection Time: 12/17/2019  3:46 PM   Specimen: Nasopharyngeal Swab  Result Value Ref Range Status   SARS Coronavirus 2 NEGATIVE NEGATIVE Final    Comment: (NOTE) SARS-CoV-2 target nucleic acids are NOT DETECTED. The SARS-CoV-2 RNA is generally detectable in upper and lower respiratory specimens during the acute phase of infection. The lowest concentration of SARS-CoV-2 viral copies this assay can detect is 250 copies / mL. A negative result does not preclude SARS-CoV-2 infection and should not be used as the sole basis  for treatment or other patient management decisions.  A negative result may occur with improper specimen collection / handling, submission of specimen other than nasopharyngeal swab, presence of viral mutation(s) within the areas targeted by this assay, and inadequate number of viral copies (<250 copies / mL). A negative result must be combined with clinical observations, patient history, and epidemiological information. Fact Sheet for Patients:   BoilerBrush.com.cy Fact Sheet for Healthcare Providers: https://pope.com/ This test is not yet approved or cleared  by the [place] FDA and has been authorized for detection and/or diagnosis of SARS-CoV-2 by FDA under an Emergency Use Authorization (EUA).  This EUA will remain in effect (meaning this test can be used) for the duration of the COVID-19 declaration under Section 564(b)(1) of the Act, 21 U.S.C. section 360bbb-3(b)(1), unless the authorization is terminated or revoked sooner. Performed at [facility], [address] [place], [place] [address]   Urine culture     Status: None   Collection Time: 12/03/19  8:19 PM   Specimen: Urine, Random  Result Value Ref Range Status   Specimen Description   Final    URINE, RANDOM Performed at [facility], [address], [place], [place] [address]    Special Requests   Final    NONE Performed at [facility], [address], [place], [place] [address]    Culture   Final    NO GROWTH Performed at [facility], [address], [place], [place] [address]    Report Status 12/04/2019 FINAL  Final  CULTURE, BLOOD (ROUTINE X 2) w Reflex to ID Panel     Status: None (Preliminary result)   Collection Time: 12/07/19 12:25 AM   Specimen: BLOOD  Result Value Ref Range Status   Specimen Description BLOOD LEFT [facility]  Final   Special Requests   Final    BOTTLES DRAWN AEROBIC AND ANAEROBIC Blood Culture adequate  volume   Culture   Final    NO GROWTH 2 DAYS Performed at [facility], [address], [place], [place] [address]    Report Status PENDING  Incomplete  CULTURE, BLOOD (ROUTINE X 2) w Reflex to ID Panel     Status: None (Preliminary result)   Collection Time: 12/07/19 12:35 AM   Specimen: BLOOD  Result Value Ref Range Status   Specimen Description BLOOD LEFT HAND  Final   Special Requests   Final    BOTTLES DRAWN AEROBIC AND ANAEROBIC Blood Culture adequate volume   Culture   Final    NO GROWTH 2 DAYS Performed at [facility], [address] [place], [place] [address]    Report Status PENDING  Incomplete  ASSESSMENT AND PLAN SYNOPSIS  59 yo admitted for active GIB from liver cirrhosis with encephalopathy, septic shock withprogressivemultiorgan failure and septic shock +MRSA AND STREP PNEUMONIABacteremia patient is in dying process  ACUTE KIDNEY INJURY/Renal Failure -creatinine 4.42 trending up -Nephrology following -The patient declines HD  RESPIRATORY: -Morbid obesity, possible OSA.  Will certainly impact respiratory mechanics, -Acute Hypoxemic Respiratory Failure -Volume overload,over 19L+ net for stay, and weight up over 25kg -NIV qHS/prn, VPAP ok if available   SHOCK-SEPSIS/HYPOVOLUMIC -use vasopressors to keep MAP60-65 -neo has been restricted to , presently at , will not up-titrate -once the patient's HCPOA arrives we will be removing pressors today for comfort care only  CARDIAC ICU monitoring, but will be transitioning to comfort measures  ID -continue IV abx as prescibed -follow up cultures  GI GI PROPHYLAXIS as indicated   DIET--> as tolerated Constipation protocol as indicated  ENDO - will use ICU hypoglycemic\Hyperglycemia protocol if indicated   ELECTROLYTES -follow labs as needed -replace as needed -pharmacy consultation and following   DVT/GI PRX ordered and assessed TRANSFUSIONS  AS NEEDED MONITOR FSBS I Assessed the need for Labs I Assessed the need for Foley I Assessed the need for Central Venous Line Family Discussion when available I Assessed the need for Mobilization I made an Assessment of medications to be adjusted accordingly Safety Risk assessment completed   GOALS OF CARE:  The patient requests to be made more comfortable and recognizes this will shift his goals of care. He understands pain management will impact his blood pressure. Meanwhile will not up-titrate pressors or advance medical management. The patient has already declined hemodialysis and made himself DNR. Please see the goals of care progress note of today.    Overall, patient is critically ill, prognosis is guarded.  Patient with Multiorgan failure and at high risk for cardiac arrest and death.   AT THIS TIME, PATIENT IS DNR/DNI, PATIENT NOT A CANDIDATE FOR HD AND PATIENT REFUSED, PATIENT ACTIVELY DYING PROCESS AND WILL PROCEED WITH COMFORT MEASURES TODAY, ALL VISITORS ARE COMING TO VISIT PATIENT   E&M level 2 //Kirti Carl

## 2019-12-27 NOTE — Progress Notes (Signed)
Notified MD pt has expired MD [place] and [place]. MD at the bedside in ICU will pass of to oncoming nurse

## 2019-12-27 NOTE — Progress Notes (Signed)
Patient has remained responsive to voice for the morning.  Placed on comfort care via physician.  Family has been visiting at bedside for the past 4 hours.  Delivered 0.5mg  dilaudid x 2 times since beginning comfort therapy. Per Arther Abbott, funeral home will be [facility] [employer] @ [address], [place] [address].

## 2019-12-27 DEATH — deceased

## 2020-04-15 IMAGING — CT CT TIBIA FIBULA *R* W/ CM
2 of 3 series · 8 of 33 positions shown, 10 images · IV contrast (agent unspecified)
Comparison: Radiographs 08/26/2018

CONTRAST:  100mL OMNIPAQUE IOHEXOL 300 MG/ML  SOLN

CLINICAL DATA: Right lower extremity pain and swelling.

EXAM:
CT OF THE LOWER RIGHT EXTREMITY WITH CONTRAST
TECHNIQUE: Multidetector CT imaging of the lower right extremity was performed
according to the standard protocol following intravenous contrast
administration.

[Series 9: axial st · axial · 0.54mm/px · z∈[+104,+536]mm · 5 of 418 slices shown, 7 images]
[im 65/418  soft-tissue]
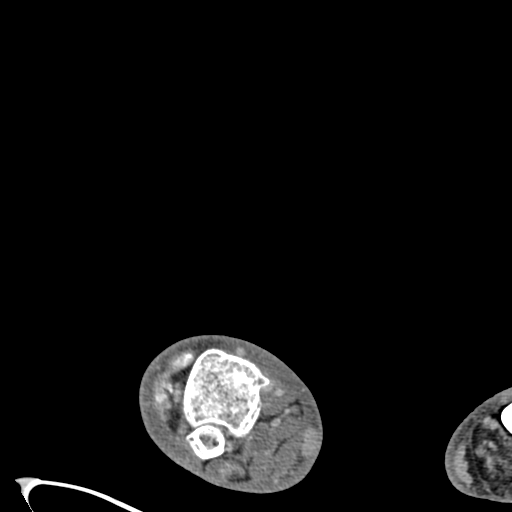
[im 65/418  bone]
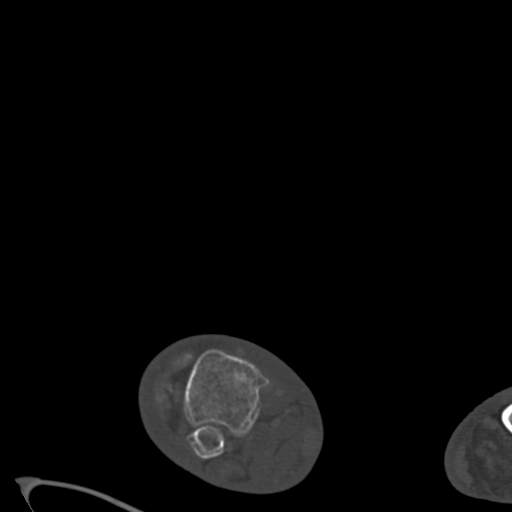
[im 129/418  bone]
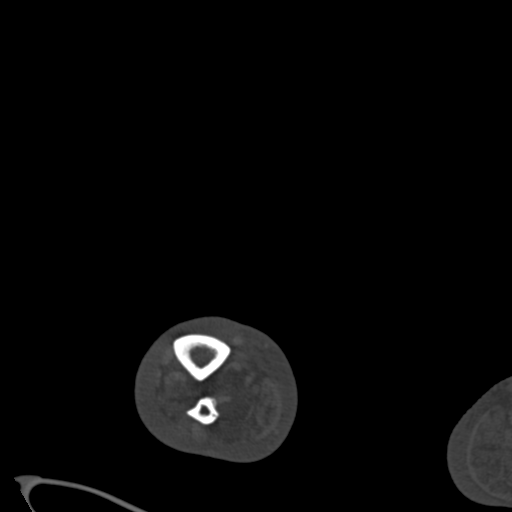
[im 225/418  bone]
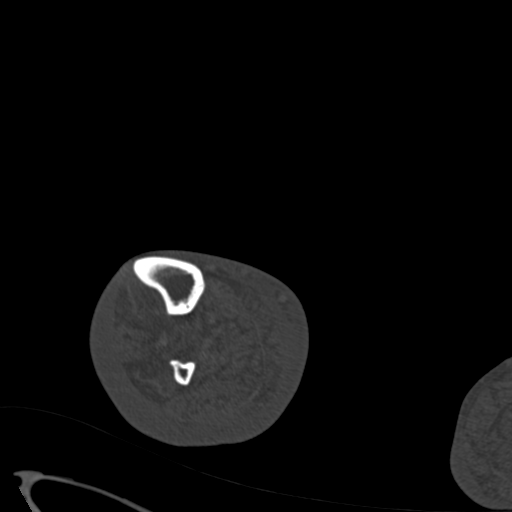
[im 289/418  bone]
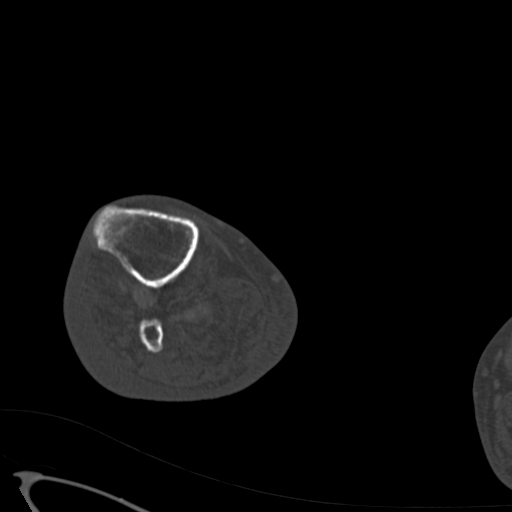
[im 353/418  soft-tissue]
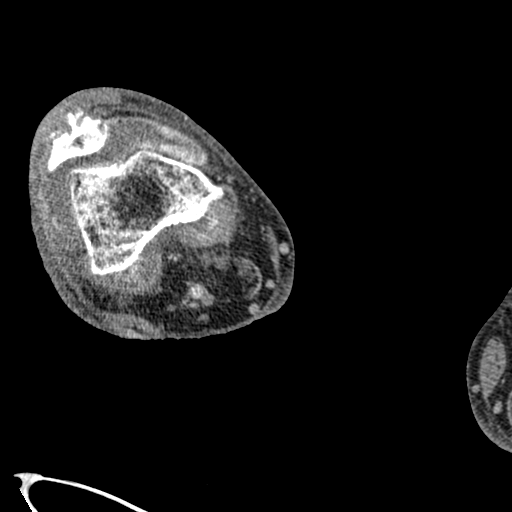
[im 353/418  bone]
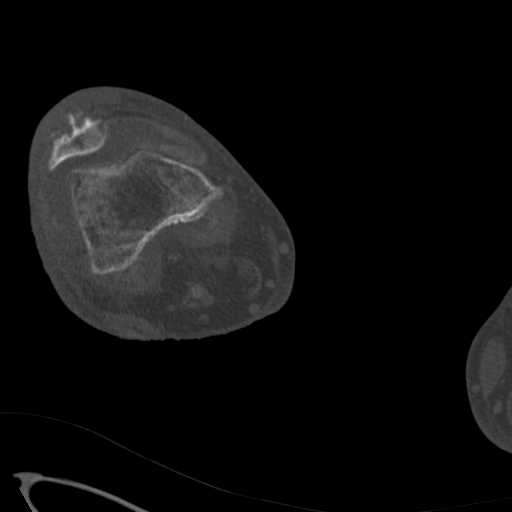

[Series 10: coronal st · coronal · 0.33mm/px · 3 of 117 slices shown]
[im 24/117  bone]
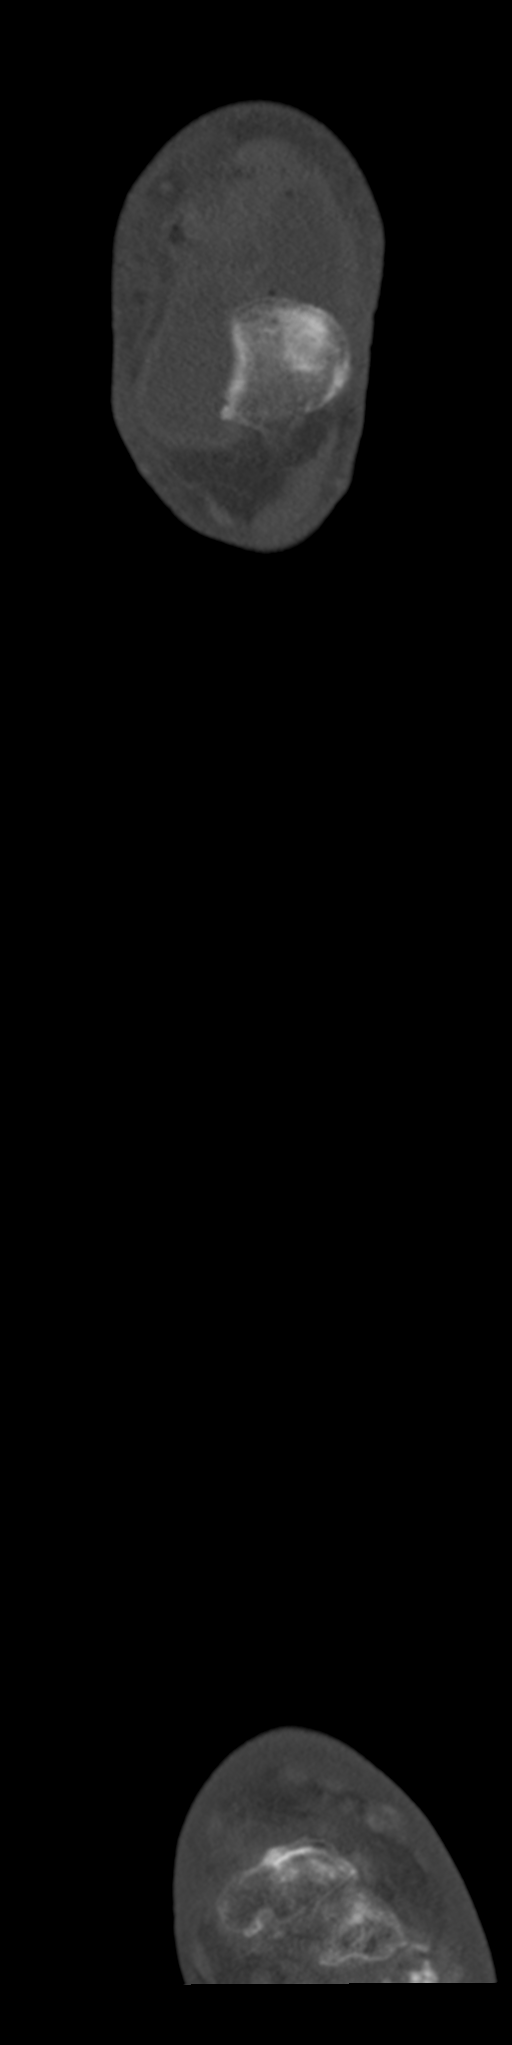
[im 47/117  bone]
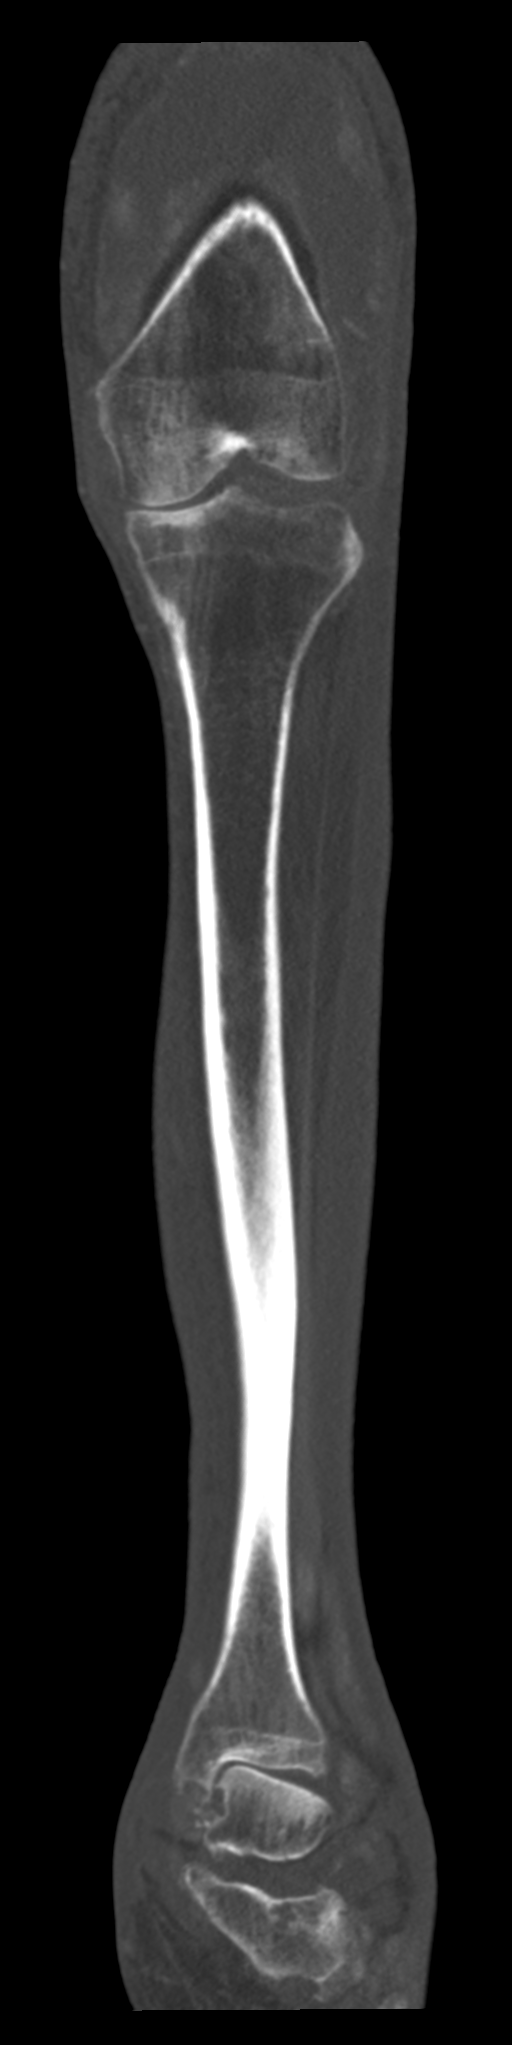
[im 70/117  bone]
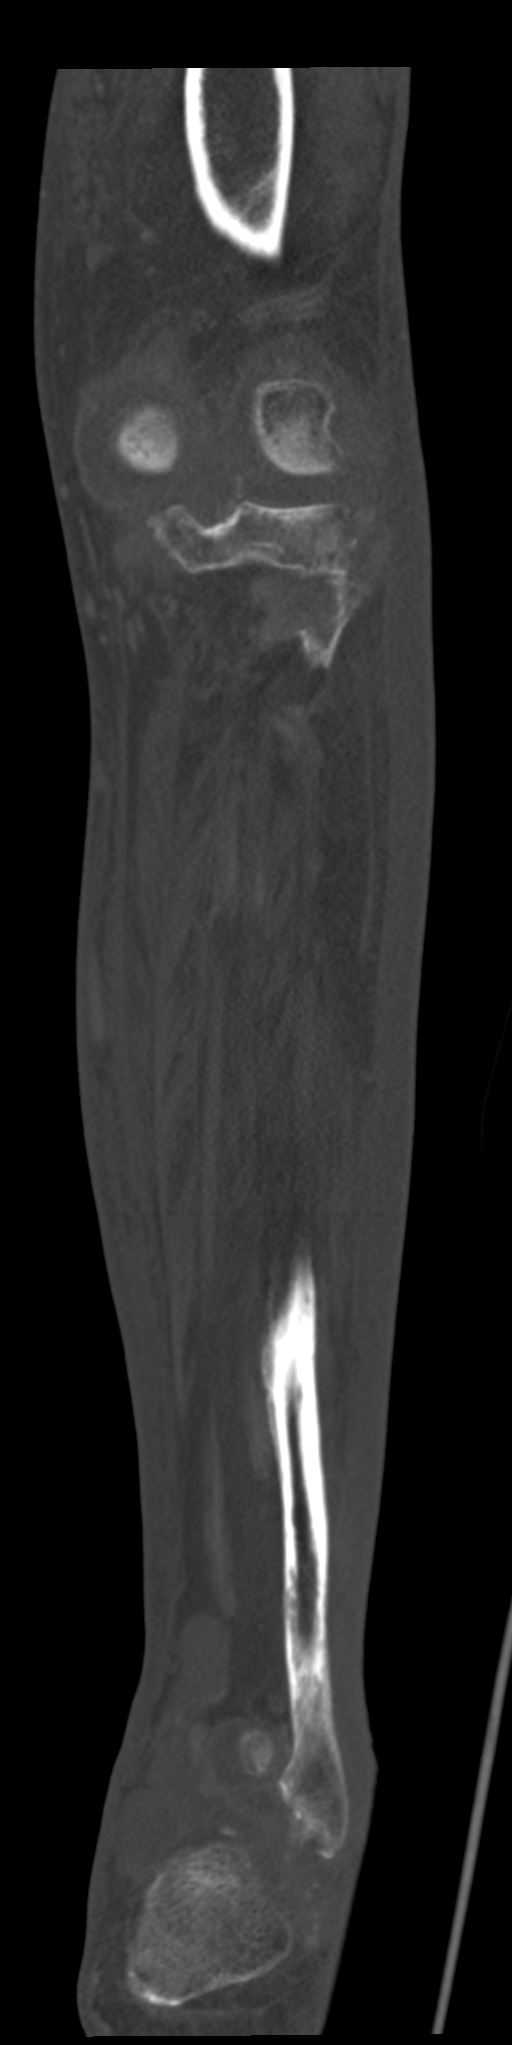

[8 of 33 positions shown; findings below may reference images not displayed]

FINDINGS: There is a very large and complex right knee joint effusion with
enhancing synovium and synovial calcifications. There is also some
air in the joint but this may be related to recent prior joint
aspiration.

There is also an erosive process involving the tibiofibular joint
with a large erosion involving the fibular head and nearby adjacent
tibia. There is adjacent calcified soft tissue densities which is
likely tophaceous gout.

Similar findings at the ankle joint with a large complex joint
effusion, synovial enhancement and nodular calcified tissue. There
also scattered erosions involving the tibia, fibula and talus.

Extensive erosive changes are also noted involving the cuneiforms.

The anterior ankle tendons demonstrate thickening, nodularity and
calcification which is also likely gout related.

There is diffuse and fairly marked subcutaneous soft tissue
swelling/edema/fluid involving the entire right lower extremity
which would suggest cellulitis. No discrete rim enhancing drainable
fluid collection to suggest an abscess. No findings for myofasciitis
or pyomyositis.
IMPRESSION: 1. Large complex joint effusions involving the knee joint and ankle
joint as detailed above. I think the findings are most likely due to
gout. Extensive tophaceous changes. Recommend correlation with
recent joint aspirations. Could not totally exclude the possibility
of superimposed septic arthritis.
2. Erosive changes and soft tissue tophi surrounding the
tibiofibular joint proximally.
3. Advanced erosive changes involving the proximal and midfoot bony
structures.
4. Tophaceous changes involving the anterior ankle tendons.
5. Diffuse subcutaneous soft tissue swelling/edema/fluid suggesting
cellulitis. No definite findings for myofasciitis or pyomyositis.

## 2020-04-15 IMAGING — DX DG KNEE 1-2V*R*
2 series · 2 of 2 positions shown · non-contrast
Comparison: None.

CLINICAL DATA: Knee pain.  No known injury.  Initial encounter.

EXAM:
RIGHT KNEE - 1-2 VIEW

[knee ap]
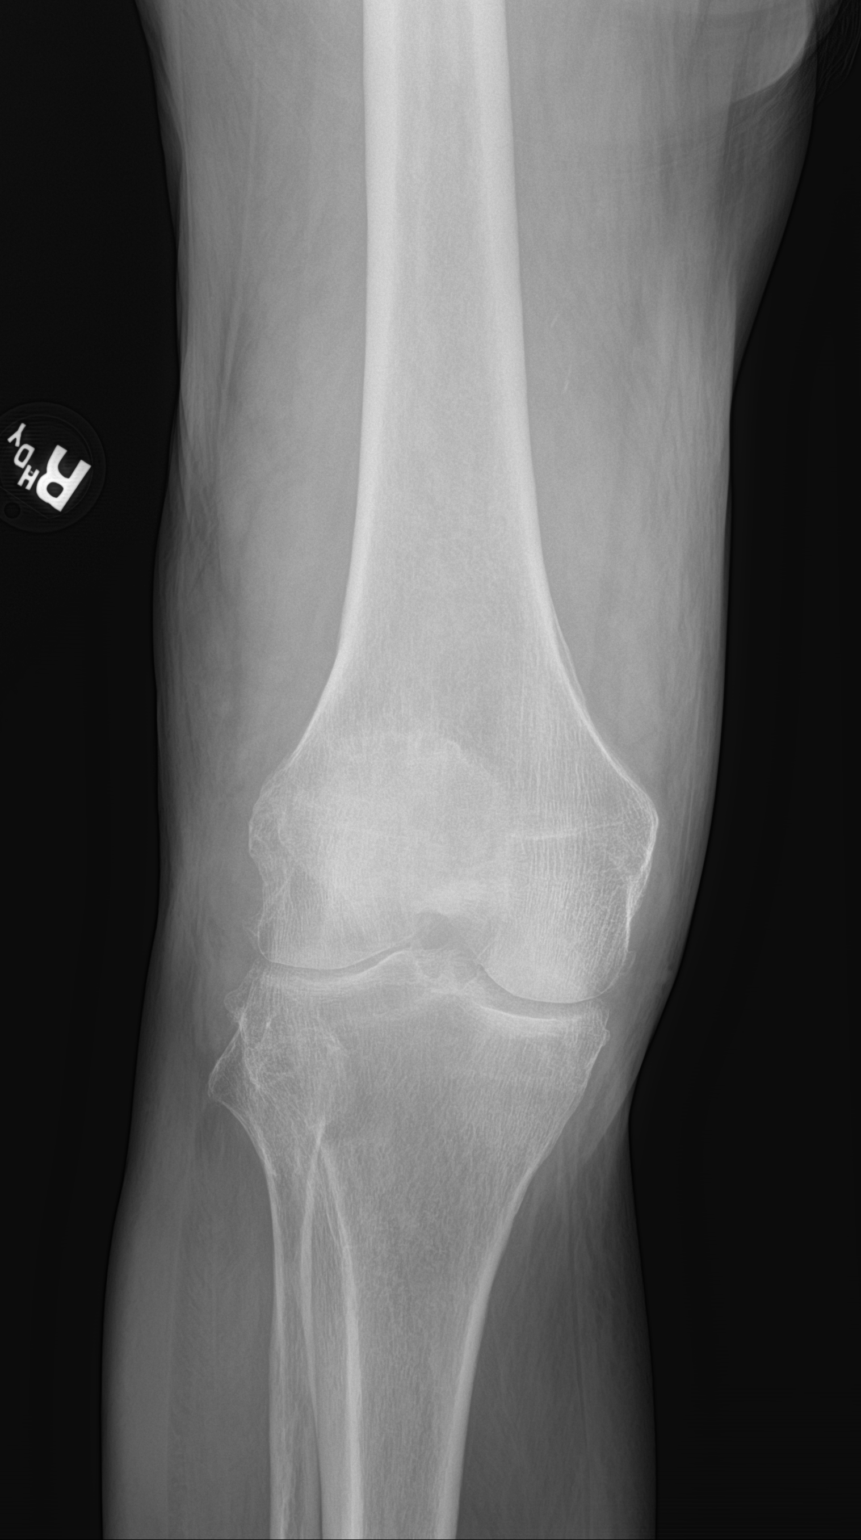

[knee lat]
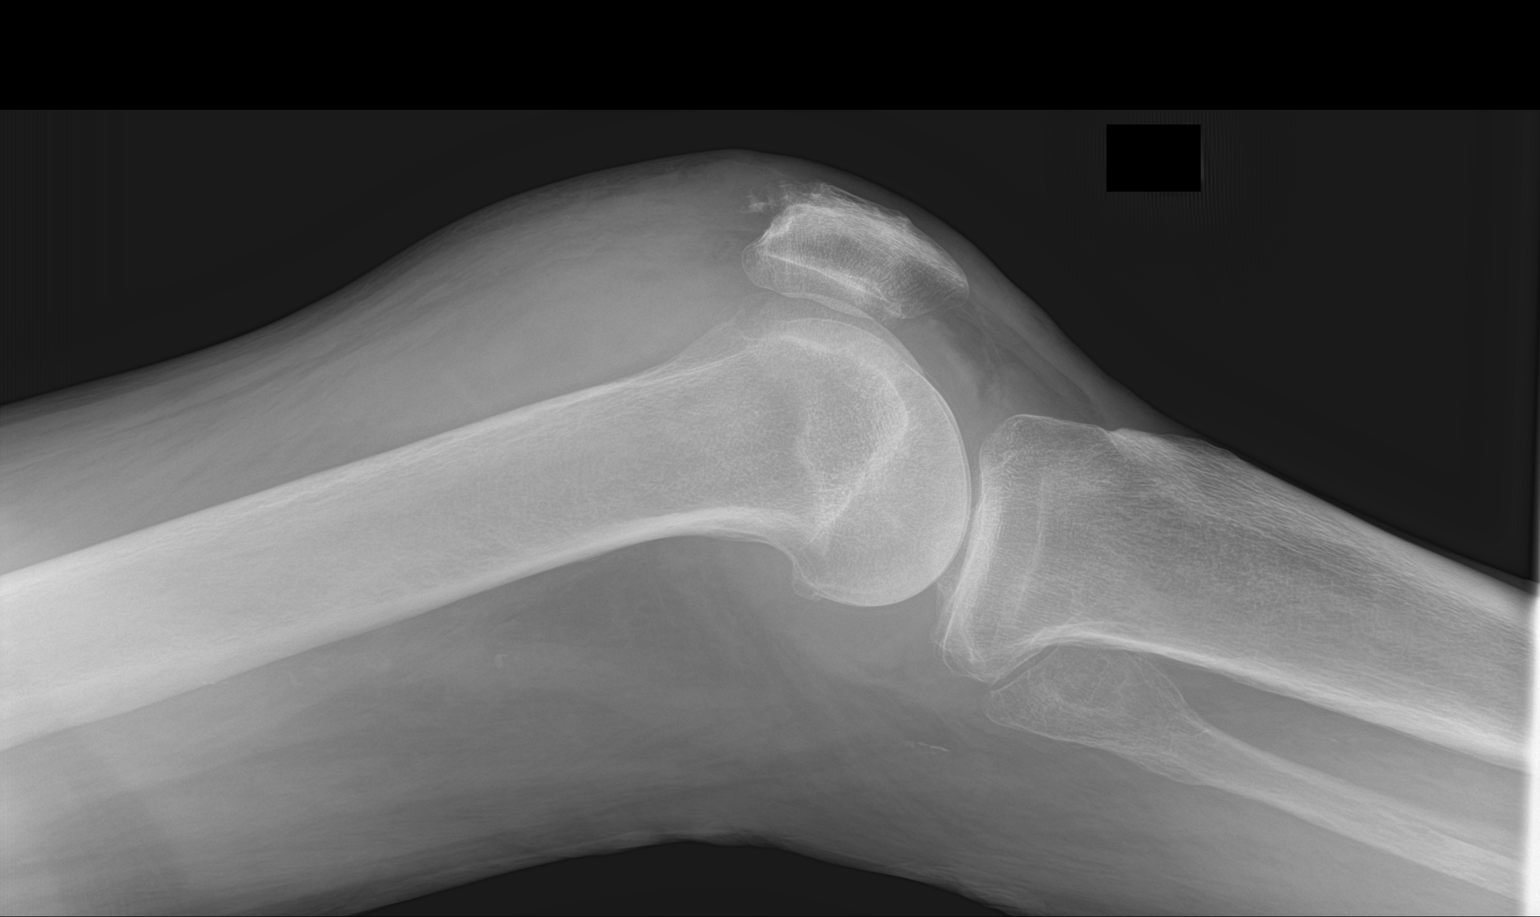

[2 of 2 positions shown; findings below may reference images not displayed]

FINDINGS: A moderate to large knee effusion is noted.

Tricompartmental degenerative changes are present.

No acute fracture or dislocation.
IMPRESSION: Moderate to large knee effusion without acute bony abnormality.

Tricompartment degenerative changes.

## 2020-04-15 IMAGING — DX DG CHEST 1V PORT
1 series · 1 of 1 positions shown · non-contrast
Comparison: 08/16/2018 chest radiograph

CLINICAL DATA: Sepsis

EXAM:
PORTABLE CHEST 1 VIEW

[chest ap]
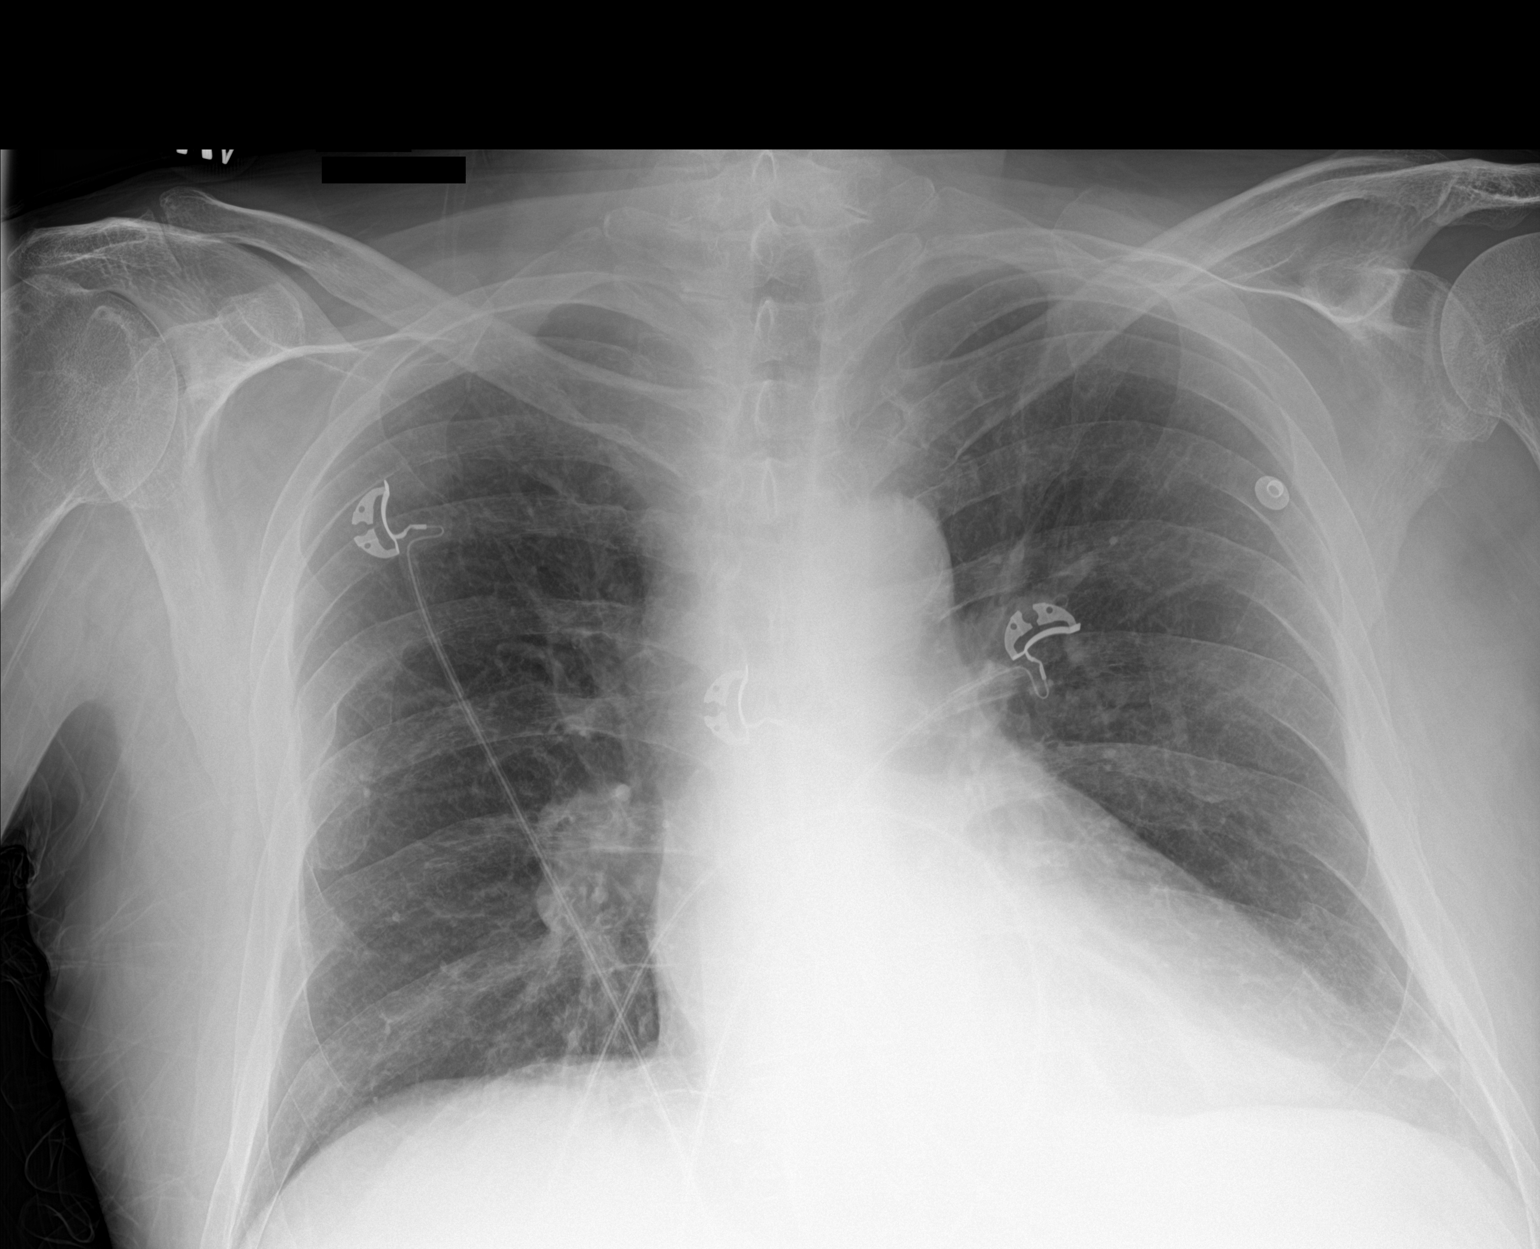

[1 of 1 positions shown; findings below may reference images not displayed]

FINDINGS: The cardiomediastinal silhouette is unremarkable.

There is no evidence of focal airspace disease, pulmonary edema,
suspicious pulmonary nodule/mass, pleural effusion, or pneumothorax.

No acute bony abnormalities are identified. Remote RIGHT rib
fractures.
IMPRESSION: No active disease.

## 2022-11-29 ENCOUNTER — Other Ambulatory Visit: Payer: Self-pay
